# Patient Record
Sex: Male | Born: 1945 | Race: Black or African American | Hispanic: No | Marital: Married | Smoking: Never smoker
Health system: Southern US, Community
[De-identification: ages and names within clinical notes are randomized; demographics above are authoritative.]

## PROBLEM LIST (undated history)

## (undated) DIAGNOSIS — K449 Diaphragmatic hernia without obstruction or gangrene: Secondary | ICD-10-CM

## (undated) DIAGNOSIS — G4730 Sleep apnea, unspecified: Secondary | ICD-10-CM

## (undated) DIAGNOSIS — I2510 Atherosclerotic heart disease of native coronary artery without angina pectoris: Secondary | ICD-10-CM

## (undated) DIAGNOSIS — Z86010 Personal history of colonic polyps: Secondary | ICD-10-CM

## (undated) DIAGNOSIS — K317 Polyp of stomach and duodenum: Secondary | ICD-10-CM

## (undated) DIAGNOSIS — Z87442 Personal history of urinary calculi: Secondary | ICD-10-CM

## (undated) DIAGNOSIS — Z8719 Personal history of other diseases of the digestive system: Secondary | ICD-10-CM

## (undated) DIAGNOSIS — R011 Cardiac murmur, unspecified: Secondary | ICD-10-CM

## (undated) DIAGNOSIS — T7840XA Allergy, unspecified, initial encounter: Secondary | ICD-10-CM

## (undated) DIAGNOSIS — C61 Malignant neoplasm of prostate: Secondary | ICD-10-CM

## (undated) DIAGNOSIS — E119 Type 2 diabetes mellitus without complications: Secondary | ICD-10-CM

## (undated) DIAGNOSIS — K2940 Chronic atrophic gastritis without bleeding: Secondary | ICD-10-CM

## (undated) DIAGNOSIS — K219 Gastro-esophageal reflux disease without esophagitis: Secondary | ICD-10-CM

## (undated) DIAGNOSIS — E785 Hyperlipidemia, unspecified: Secondary | ICD-10-CM

## (undated) DIAGNOSIS — D509 Iron deficiency anemia, unspecified: Secondary | ICD-10-CM

## (undated) DIAGNOSIS — K649 Unspecified hemorrhoids: Secondary | ICD-10-CM

## (undated) DIAGNOSIS — K5730 Diverticulosis of large intestine without perforation or abscess without bleeding: Secondary | ICD-10-CM

## (undated) DIAGNOSIS — D472 Monoclonal gammopathy: Secondary | ICD-10-CM

## (undated) DIAGNOSIS — G4733 Obstructive sleep apnea (adult) (pediatric): Secondary | ICD-10-CM

## (undated) DIAGNOSIS — I10 Essential (primary) hypertension: Secondary | ICD-10-CM

## (undated) DIAGNOSIS — N189 Chronic kidney disease, unspecified: Secondary | ICD-10-CM

## (undated) HISTORY — DX: Allergy, unspecified, initial encounter: T78.40XA

## (undated) HISTORY — DX: Malignant neoplasm of prostate: C61

## (undated) HISTORY — DX: Type 2 diabetes mellitus without complications: E11.9

## (undated) HISTORY — DX: Diverticulosis of large intestine without perforation or abscess without bleeding: K57.30

## (undated) HISTORY — PX: ESOPHAGOGASTRODUODENOSCOPY: SHX1529

## (undated) HISTORY — DX: Chronic atrophic gastritis without bleeding: K29.40

## (undated) HISTORY — DX: Cardiac murmur, unspecified: R01.1

## (undated) HISTORY — DX: Iron deficiency anemia, unspecified: D50.9

## (undated) HISTORY — DX: Monoclonal gammopathy: D47.2

## (undated) HISTORY — PX: UPPER GASTROINTESTINAL ENDOSCOPY: SHX188

## (undated) HISTORY — DX: Gastro-esophageal reflux disease without esophagitis: K21.9

## (undated) HISTORY — DX: Hyperlipidemia, unspecified: E78.5

## (undated) HISTORY — DX: Polyp of stomach and duodenum: K31.7

## (undated) HISTORY — PX: GI PROSTATE BIOPSY: SUR644

## (undated) HISTORY — DX: Unspecified hemorrhoids: K64.9

## (undated) HISTORY — DX: Essential (primary) hypertension: I10

## (undated) HISTORY — DX: Obstructive sleep apnea (adult) (pediatric): G47.33

## (undated) HISTORY — PX: COLONOSCOPY: SHX174

## (undated) HISTORY — PX: HEMORRHOID SURGERY: SHX153

## (undated) HISTORY — DX: Personal history of colonic polyps: Z86.010

## (undated) HISTORY — DX: Diaphragmatic hernia without obstruction or gangrene: K44.9

## (undated) HISTORY — DX: Sleep apnea, unspecified: G47.30

---

## 1997-11-15 HISTORY — PX: KNEE SURGERY: SHX244

## 1998-05-26 ENCOUNTER — Ambulatory Visit (HOSPITAL_COMMUNITY): Admission: RE | Admit: 1998-05-26 | Discharge: 1998-05-26 | Payer: Self-pay | Admitting: Orthopedic Surgery

## 1999-05-04 ENCOUNTER — Emergency Department (HOSPITAL_COMMUNITY): Admission: EM | Admit: 1999-05-04 | Discharge: 1999-05-04 | Payer: Self-pay | Admitting: Emergency Medicine

## 1999-08-25 ENCOUNTER — Encounter: Admission: RE | Admit: 1999-08-25 | Discharge: 1999-08-25 | Payer: Self-pay | Admitting: Internal Medicine

## 2000-08-04 ENCOUNTER — Other Ambulatory Visit: Admission: RE | Admit: 2000-08-04 | Discharge: 2000-08-04 | Payer: Self-pay | Admitting: Plastic Surgery

## 2000-08-04 ENCOUNTER — Encounter (INDEPENDENT_AMBULATORY_CARE_PROVIDER_SITE_OTHER): Payer: Self-pay | Admitting: Specialist

## 2000-09-03 ENCOUNTER — Emergency Department (HOSPITAL_COMMUNITY): Admission: EM | Admit: 2000-09-03 | Discharge: 2000-09-03 | Payer: Self-pay | Admitting: Emergency Medicine

## 2000-09-03 ENCOUNTER — Encounter: Payer: Self-pay | Admitting: Emergency Medicine

## 2000-09-22 ENCOUNTER — Other Ambulatory Visit: Admission: RE | Admit: 2000-09-22 | Discharge: 2000-09-22 | Payer: Self-pay | Admitting: Urology

## 2000-09-28 ENCOUNTER — Encounter: Payer: Self-pay | Admitting: Urology

## 2000-09-28 ENCOUNTER — Ambulatory Visit (HOSPITAL_COMMUNITY): Admission: RE | Admit: 2000-09-28 | Discharge: 2000-09-28 | Payer: Self-pay | Admitting: Urology

## 2001-06-13 ENCOUNTER — Encounter: Payer: Self-pay | Admitting: Internal Medicine

## 2001-06-13 ENCOUNTER — Encounter: Admission: RE | Admit: 2001-06-13 | Discharge: 2001-06-13 | Payer: Self-pay | Admitting: Internal Medicine

## 2001-07-26 DIAGNOSIS — K219 Gastro-esophageal reflux disease without esophagitis: Secondary | ICD-10-CM

## 2001-07-26 HISTORY — DX: Gastro-esophageal reflux disease without esophagitis: K21.9

## 2001-12-21 ENCOUNTER — Encounter: Payer: Self-pay | Admitting: Internal Medicine

## 2001-12-21 ENCOUNTER — Encounter: Admission: RE | Admit: 2001-12-21 | Discharge: 2001-12-21 | Payer: Self-pay | Admitting: Internal Medicine

## 2002-01-04 ENCOUNTER — Encounter: Admission: RE | Admit: 2002-01-04 | Discharge: 2002-01-04 | Payer: Self-pay | Admitting: Internal Medicine

## 2002-01-04 ENCOUNTER — Encounter: Payer: Self-pay | Admitting: Internal Medicine

## 2002-01-16 ENCOUNTER — Encounter: Admission: RE | Admit: 2002-01-16 | Discharge: 2002-01-16 | Payer: Self-pay | Admitting: Internal Medicine

## 2002-01-16 ENCOUNTER — Encounter: Payer: Self-pay | Admitting: Internal Medicine

## 2002-01-18 ENCOUNTER — Encounter: Payer: Self-pay | Admitting: Internal Medicine

## 2002-01-18 ENCOUNTER — Encounter: Admission: RE | Admit: 2002-01-18 | Discharge: 2002-01-18 | Payer: Self-pay | Admitting: Internal Medicine

## 2004-08-21 ENCOUNTER — Encounter: Admission: RE | Admit: 2004-08-21 | Discharge: 2004-08-21 | Payer: Self-pay | Admitting: Internal Medicine

## 2005-02-22 ENCOUNTER — Encounter: Admission: RE | Admit: 2005-02-22 | Discharge: 2005-02-22 | Payer: Self-pay | Admitting: Internal Medicine

## 2005-02-23 ENCOUNTER — Emergency Department (HOSPITAL_COMMUNITY): Admission: EM | Admit: 2005-02-23 | Discharge: 2005-02-23 | Payer: Self-pay | Admitting: Emergency Medicine

## 2005-11-30 ENCOUNTER — Inpatient Hospital Stay (HOSPITAL_COMMUNITY): Admission: EM | Admit: 2005-11-30 | Discharge: 2005-12-01 | Payer: Self-pay | Admitting: Emergency Medicine

## 2005-11-30 ENCOUNTER — Ambulatory Visit: Payer: Self-pay | Admitting: *Deleted

## 2005-12-01 ENCOUNTER — Encounter (INDEPENDENT_AMBULATORY_CARE_PROVIDER_SITE_OTHER): Payer: Self-pay | Admitting: *Deleted

## 2005-12-23 ENCOUNTER — Ambulatory Visit: Payer: Self-pay | Admitting: *Deleted

## 2006-02-10 ENCOUNTER — Ambulatory Visit: Payer: Self-pay | Admitting: Gastroenterology

## 2006-02-11 ENCOUNTER — Ambulatory Visit: Payer: Self-pay | Admitting: Gastroenterology

## 2006-02-13 DIAGNOSIS — K2270 Barrett's esophagus without dysplasia: Secondary | ICD-10-CM

## 2006-02-13 HISTORY — DX: Barrett's esophagus without dysplasia: K22.70

## 2006-02-14 ENCOUNTER — Ambulatory Visit: Payer: Self-pay | Admitting: Gastroenterology

## 2006-02-14 ENCOUNTER — Encounter (INDEPENDENT_AMBULATORY_CARE_PROVIDER_SITE_OTHER): Payer: Self-pay | Admitting: *Deleted

## 2006-03-22 ENCOUNTER — Ambulatory Visit: Payer: Self-pay | Admitting: Gastroenterology

## 2006-06-02 ENCOUNTER — Encounter: Admission: RE | Admit: 2006-06-02 | Discharge: 2006-07-05 | Payer: Self-pay | Admitting: Internal Medicine

## 2006-07-26 ENCOUNTER — Encounter: Payer: Self-pay | Admitting: Cardiology

## 2006-07-26 ENCOUNTER — Ambulatory Visit: Payer: Self-pay

## 2006-07-26 ENCOUNTER — Ambulatory Visit: Payer: Self-pay | Admitting: *Deleted

## 2006-08-22 ENCOUNTER — Ambulatory Visit: Payer: Self-pay | Admitting: Gastroenterology

## 2006-09-05 ENCOUNTER — Ambulatory Visit: Payer: Self-pay | Admitting: Gastroenterology

## 2006-09-05 ENCOUNTER — Encounter (INDEPENDENT_AMBULATORY_CARE_PROVIDER_SITE_OTHER): Payer: Self-pay | Admitting: *Deleted

## 2006-09-05 DIAGNOSIS — K5730 Diverticulosis of large intestine without perforation or abscess without bleeding: Secondary | ICD-10-CM

## 2006-09-05 DIAGNOSIS — Z860100 Personal history of colon polyps, unspecified: Secondary | ICD-10-CM

## 2006-09-05 DIAGNOSIS — K2940 Chronic atrophic gastritis without bleeding: Secondary | ICD-10-CM

## 2006-09-05 DIAGNOSIS — K449 Diaphragmatic hernia without obstruction or gangrene: Secondary | ICD-10-CM

## 2006-09-05 HISTORY — DX: Chronic atrophic gastritis without bleeding: K29.40

## 2006-09-05 HISTORY — DX: Diaphragmatic hernia without obstruction or gangrene: K44.9

## 2006-09-05 HISTORY — DX: Personal history of colon polyps, unspecified: Z86.0100

## 2006-09-05 HISTORY — DX: Diverticulosis of large intestine without perforation or abscess without bleeding: K57.30

## 2006-09-05 HISTORY — DX: Personal history of colonic polyps: Z86.010

## 2006-10-05 ENCOUNTER — Encounter: Admission: RE | Admit: 2006-10-05 | Discharge: 2007-01-03 | Payer: Self-pay | Admitting: Internal Medicine

## 2006-10-05 ENCOUNTER — Encounter: Admission: RE | Admit: 2006-10-05 | Discharge: 2006-11-04 | Payer: Self-pay | Admitting: Internal Medicine

## 2007-01-19 ENCOUNTER — Ambulatory Visit: Payer: Self-pay | Admitting: *Deleted

## 2007-01-19 LAB — CONVERTED CEMR LAB
Calcium: 9.3 mg/dL (ref 8.4–10.5)
Creatinine, Ser: 1.1 mg/dL (ref 0.4–1.5)
GFR calc non Af Amer: 73 mL/min
Glucose, Bld: 90 mg/dL (ref 70–99)
Sodium: 140 meq/L (ref 135–145)

## 2007-08-07 ENCOUNTER — Ambulatory Visit: Payer: Self-pay | Admitting: Internal Medicine

## 2007-08-25 ENCOUNTER — Ambulatory Visit: Payer: Self-pay

## 2007-08-25 ENCOUNTER — Encounter: Payer: Self-pay | Admitting: Internal Medicine

## 2007-08-25 ENCOUNTER — Ambulatory Visit: Payer: Self-pay | Admitting: Internal Medicine

## 2007-08-25 LAB — CONVERTED CEMR LAB
CO2: 29 meq/L (ref 19–32)
Creatinine, Ser: 1.3 mg/dL (ref 0.4–1.5)
Potassium: 3.9 meq/L (ref 3.5–5.1)
Sodium: 143 meq/L (ref 135–145)

## 2007-10-05 ENCOUNTER — Ambulatory Visit: Payer: Self-pay | Admitting: Pulmonary Disease

## 2007-10-19 ENCOUNTER — Encounter: Payer: Self-pay | Admitting: Pulmonary Disease

## 2007-10-19 ENCOUNTER — Ambulatory Visit (HOSPITAL_BASED_OUTPATIENT_CLINIC_OR_DEPARTMENT_OTHER): Admission: RE | Admit: 2007-10-19 | Discharge: 2007-10-19 | Payer: Self-pay | Admitting: Pulmonary Disease

## 2007-11-14 ENCOUNTER — Ambulatory Visit: Payer: Self-pay | Admitting: Pulmonary Disease

## 2007-11-22 ENCOUNTER — Encounter: Payer: Self-pay | Admitting: Pulmonary Disease

## 2007-11-22 ENCOUNTER — Telehealth (INDEPENDENT_AMBULATORY_CARE_PROVIDER_SITE_OTHER): Payer: Self-pay | Admitting: *Deleted

## 2007-11-22 DIAGNOSIS — E119 Type 2 diabetes mellitus without complications: Secondary | ICD-10-CM

## 2007-11-22 DIAGNOSIS — N200 Calculus of kidney: Secondary | ICD-10-CM

## 2007-11-22 DIAGNOSIS — I10 Essential (primary) hypertension: Secondary | ICD-10-CM | POA: Insufficient documentation

## 2007-11-22 DIAGNOSIS — E785 Hyperlipidemia, unspecified: Secondary | ICD-10-CM

## 2007-12-15 ENCOUNTER — Ambulatory Visit: Payer: Self-pay | Admitting: Pulmonary Disease

## 2007-12-15 DIAGNOSIS — G4733 Obstructive sleep apnea (adult) (pediatric): Secondary | ICD-10-CM

## 2008-01-10 ENCOUNTER — Ambulatory Visit: Payer: Self-pay | Admitting: Pulmonary Disease

## 2008-01-23 ENCOUNTER — Encounter: Payer: Self-pay | Admitting: Pulmonary Disease

## 2008-03-12 ENCOUNTER — Encounter: Payer: Self-pay | Admitting: Internal Medicine

## 2008-03-14 ENCOUNTER — Ambulatory Visit: Payer: Self-pay | Admitting: Internal Medicine

## 2008-09-30 ENCOUNTER — Ambulatory Visit: Payer: Self-pay | Admitting: Internal Medicine

## 2009-02-18 ENCOUNTER — Encounter (INDEPENDENT_AMBULATORY_CARE_PROVIDER_SITE_OTHER): Payer: Self-pay | Admitting: *Deleted

## 2009-02-27 ENCOUNTER — Ambulatory Visit: Payer: Self-pay | Admitting: Internal Medicine

## 2009-04-10 ENCOUNTER — Ambulatory Visit: Payer: Self-pay | Admitting: Internal Medicine

## 2009-04-18 ENCOUNTER — Encounter: Payer: Self-pay | Admitting: Internal Medicine

## 2009-04-18 ENCOUNTER — Ambulatory Visit: Payer: Self-pay | Admitting: Internal Medicine

## 2009-06-02 ENCOUNTER — Ambulatory Visit: Payer: Self-pay | Admitting: Internal Medicine

## 2009-06-02 ENCOUNTER — Encounter: Admission: RE | Admit: 2009-06-02 | Discharge: 2009-06-02 | Payer: Self-pay | Admitting: Internal Medicine

## 2009-06-05 ENCOUNTER — Encounter: Admission: RE | Admit: 2009-06-05 | Discharge: 2009-06-05 | Payer: Self-pay | Admitting: Internal Medicine

## 2009-06-16 ENCOUNTER — Ambulatory Visit: Payer: Self-pay | Admitting: Internal Medicine

## 2009-06-16 ENCOUNTER — Ambulatory Visit: Payer: Self-pay

## 2009-06-16 ENCOUNTER — Encounter: Payer: Self-pay | Admitting: Cardiovascular Disease

## 2009-06-25 ENCOUNTER — Telehealth: Payer: Self-pay | Admitting: Internal Medicine

## 2009-07-15 ENCOUNTER — Ambulatory Visit: Payer: Self-pay | Admitting: Internal Medicine

## 2009-10-24 ENCOUNTER — Ambulatory Visit: Payer: Self-pay | Admitting: Internal Medicine

## 2009-11-27 ENCOUNTER — Telehealth: Payer: Self-pay | Admitting: Gastroenterology

## 2009-12-05 ENCOUNTER — Encounter (INDEPENDENT_AMBULATORY_CARE_PROVIDER_SITE_OTHER): Payer: Self-pay | Admitting: *Deleted

## 2009-12-26 ENCOUNTER — Encounter (INDEPENDENT_AMBULATORY_CARE_PROVIDER_SITE_OTHER): Payer: Self-pay | Admitting: *Deleted

## 2009-12-29 ENCOUNTER — Ambulatory Visit: Payer: Self-pay | Admitting: Gastroenterology

## 2010-01-12 ENCOUNTER — Ambulatory Visit: Payer: Self-pay | Admitting: Gastroenterology

## 2010-01-12 DIAGNOSIS — K2970 Gastritis, unspecified, without bleeding: Secondary | ICD-10-CM | POA: Insufficient documentation

## 2010-01-12 DIAGNOSIS — K2990 Gastroduodenitis, unspecified, without bleeding: Secondary | ICD-10-CM

## 2010-02-17 ENCOUNTER — Ambulatory Visit: Payer: Self-pay | Admitting: Internal Medicine

## 2010-02-17 ENCOUNTER — Encounter: Payer: Self-pay | Admitting: Internal Medicine

## 2010-05-01 ENCOUNTER — Encounter (INDEPENDENT_AMBULATORY_CARE_PROVIDER_SITE_OTHER): Payer: Self-pay | Admitting: *Deleted

## 2010-05-01 ENCOUNTER — Ambulatory Visit: Payer: Self-pay | Admitting: Internal Medicine

## 2010-06-12 ENCOUNTER — Ambulatory Visit: Payer: Self-pay | Admitting: Internal Medicine

## 2010-06-12 ENCOUNTER — Encounter: Payer: Self-pay | Admitting: Internal Medicine

## 2010-07-24 ENCOUNTER — Encounter: Payer: Self-pay | Admitting: Internal Medicine

## 2010-07-24 DIAGNOSIS — I679 Cerebrovascular disease, unspecified: Secondary | ICD-10-CM

## 2010-07-31 ENCOUNTER — Ambulatory Visit: Payer: Self-pay

## 2010-07-31 ENCOUNTER — Encounter: Payer: Self-pay | Admitting: Internal Medicine

## 2010-11-13 ENCOUNTER — Ambulatory Visit
Admission: RE | Admit: 2010-11-13 | Discharge: 2010-11-13 | Payer: Self-pay | Source: Home / Self Care | Attending: Internal Medicine | Admitting: Internal Medicine

## 2010-12-15 NOTE — Assessment & Plan Note (Signed)
Summary: yearly/sl   Visit Type:  Follow-up Primary Provider:  Eden Emms [name]   History of Present Illness: Mr. Demarais is a 65 year old gentleman with a history of hypertenison, sleep apnea and dyslpidemia.  I saw him in clinic 1 year ago. Since seen hs denies chest pain.  breathing is OK.  He continues to use his CPAP.   Took meds at 6 AM  Current Medications (verified): 1)  Lipitor 10 Mg  Tabs (Atorvastatin Calcium) .... Once Daily 2)  Flomax 0.4 Mg  Cp24 (Tamsulosin Hcl) .... Take By Mouth As Needed 3)  Adult Aspirin Ec Low Strength 81 Mg  Tbec (Aspirin) .... Take 1 Tablet By Mouth Once A Day 4)  Lotrel 10-20 Mg  Caps (Amlodipine Besy-Benazepril Hcl) .... Take 1 Tablet By Mouth Once A Day 5)  Androgel Pump 1 % Gel (Testosterone) .... Once A Day 6)  Furosemide 40 Mg Tabs (Furosemide) .... Take One Tablet By Mouth Daily. 7)  Nexium 40 Mg Cpdr (Esomeprazole Magnesium) .... Take One Tablet By Mouth Daily.  Allergies: No Known Drug Allergies  Past History:  Past medical, surgical, family and social histories (including risk factors) reviewed, and no changes noted (except as noted below).  Past Medical History: Reviewed history from 11/22/2007 and no changes required. DM (ICD-250.00) GOUTY ARTHRITIS (ICD-274.0) NEPHROLITHIASIS (ICD-592.0) DYSLIPIDEMIA (ICD-272.4) HYPERTENSION (ICD-401.9)  Family History: Reviewed history from 06/14/2009 and no changes required.  Father died of a heart attack at age 83.  Mother died 72s  with diabetes.  One brother has diabetes.  One brother is dead, not heart  related.  Social History: Reviewed history from 06/14/2009 and no changes required. He is a Paramedic.  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  He is  married and has three children.  Vital Signs:  Patient profile:   65 year old male Height:      69 inches Weight:      294 pounds BMI:     43.57 Pulse rate:   80 / minute BP sitting:   142 / 93  (left arm) Cuff size:   large  Vitals  Entered By: Burnett Kanaris, CNA (July 24, 2010 11:59 AM)  Physical Exam  Additional Exam:  Obese 65 year old in NAD HEENT:  Normocephalic, atraumatic. EOMI, PERRLA.  Neck: JVP is normal. No thyromegaly. No bruits.  Lungs: clear to auscultation. No rales no wheezes.  Heart: Regular rate and rhythm. Normal S1, S2. No S3.   No significant murmurs. PMI not displaced.  Abdomen:  Supple, nontender. Normal bowel sounds. No masses. No hepatomegaly.  Extremities:   Good distal pulses throughout. No lower extremity edema.  Musculoskeletal :moving all extremities.  Neuro:   alert and oriented x3.    EKG  Procedure date:  07/24/2010  Findings:      NSR  80 bpm.  LVH.  Nonspecific ST T wave changes.  Impression & Recommendations:  Problem # 1:  HYPERTENSION (ICD-401.9) Keep on same regimen.  BP is up some.  Higher than on last visit when it was under good control ensured that he was using CPAP Will get note from Dr. Beryle Quant office.  He is due to see her in December.  i would not change meds now.  Problem # 2:  OBSTRUCTIVE SLEEP APNEA (ICD-327.23) Continue to use CPAP  Problem # 3:  DYSLIPIDEMIA (ICD-272.4) Will get records from Dr. Beryle Quant office.  Keep on Lipitor.  Other Orders: EKG w/ Interpretation (93000) Carotid Duplex (Carotid Duplex)  Patient  Instructions: 1)  Your physician recommends that you schedule a follow-up appointment in: 12 months 2)  Your physician has requested that you have a carotid duplex. This test is an ultrasound of the carotid arteries in your neck. It looks at blood flow through these arteries that supply the brain with blood. Allow one hour for this exam. There are no restrictions or special instructions. 3)  Your physician recommends that you continue on your current medications as directed. Please refer to the Current Medication list given to you today.  Appended Document: yearly/sl Records from Dr.[name]'s office received and given to Dr.Ross.

## 2010-12-15 NOTE — Procedures (Signed)
Summary: Upper Endoscopy  Patient: Gabriela Giannelli Note: All result statuses are Final unless otherwise noted.  Tests: (1) Upper Endoscopy (EGD)   EGD Upper Endoscopy       DONE     [facility]     [address]     [place], [place]  [address]           ENDOSCOPY PROCEDURE REPORT           PATIENT:  Wille, Aubuchon  MR#:  [id number]     BIRTHDATE:  [birth year], 63 yrs. old  GENDER:  male           ENDOSCOPIST:  Vania Rea. [name], MD, [facility]     Referred by:           PROCEDURE DATE:  01/12/2010     PROCEDURE:  EGD with biopsy     ASA CLASS:  Class II     INDICATIONS:  GERD           MEDICATIONS:  None     TOPICAL ANESTHETIC:  Exactacain Spray           DESCRIPTION OF PROCEDURE:   After the risks benefits and     alternatives of the procedure were thoroughly explained, informed     consent was obtained.  The LB GIF-H180 [id number] endoscope was     introduced through the mouth and advanced to the second portion of     the duodenum, without limitations.  The instrument was slowly     withdrawn as the mucosa was fully examined.     <<PROCEDUREIMAGES>>           Normal duodenal folds were noted.  Moderate gastritis was found in     the antrum. nodular gastritis and erosions.CLO bx. done.Also     scattered benign hyperplastic polyps noted in body and funduds     areas.  The esophagus and gastroesophageal junction were     completely normal in appearance.    Retroflexed views revealed no     masses.    The scope was then withdrawn from the patient and the     procedure completed.           COMPLICATIONS:  None           ENDOSCOPIC IMPRESSION:     1) Normal duodenal folds     2) Moderate gastritis in the antrum     3) Normal esophagus     4) No masses     r/o h.pylori.continue PPi rx.     RECOMMENDATIONS:     1) Anti-reflux regimen to be follow     2) Rx CLO if positive     3) continue PPI           REPEAT EXAM:  No           ______________________________     Vania Rea. [name], MD, [facility]           CC:           n.     eSIGNED:   Vania Rea. Wren Pryce at 01/12/2010 10:06 AM           Deatra Canter, [id number]  Note: An exclamation mark (!) indicates a result that was not dispersed into the flowsheet. Document Creation Date: 01/12/2010 10:06 AM _______________________________________________________________________  (1) Order result status: Final Collection or observation date-time: 01/12/2010 09:58 Requested date-time:  Receipt date-time:  Reported date-time:  Referring Physician:  Ordering Physician: Sheryn Bison [phone]) Specimen Source:  Source: Launa Grill Order Number: [id number] Lab site:

## 2010-12-15 NOTE — Letter (Signed)
Summary: [facility] Instructions  [facility]  [address] [place], [place] [address]   Phone: [phone]  Fax: [phone]       Leonard [name]    [birth year]    MRN: [id number]        Procedure Day /Date:  Monday 01/12/2010     Arrival Time: 8:30 am      Procedure Time: 9:30 am     Location of Procedure:                    _x _  [facility] (4th Floor)                        PREPARATION FOR COLONOSCOPY WITH MOVIPREP   Starting 5 days prior to your procedure Wednesday 2/23  do not eat nuts, seeds, popcorn, corn, beans, peas,  salads, or any raw vegetables.  Do not take any fiber supplements (e.g. Metamucil, Citrucel, and Benefiber).  THE DAY BEFORE YOUR PROCEDURE         DATE: Sunday 2/27  1.  Drink clear liquids the entire day-NO SOLID FOOD  2.  Do not drink anything colored red or purple.  Avoid juices with pulp.  No orange juice.  3.  Drink at least 64 oz. (8 glasses) of fluid/clear liquids during the day to prevent dehydration and help the prep work efficiently.  CLEAR LIQUIDS INCLUDE: Water Jello Ice Popsicles Tea (sugar ok, no milk/cream) Powdered fruit flavored drinks Coffee (sugar ok, no milk/cream) Gatorade Juice: apple, white grape, white cranberry  Lemonade Clear bullion, consomm, broth Carbonated beverages (any kind) Strained chicken noodle soup Hard Candy                             4.  In the morning, mix first dose of MoviPrep solution:    Empty 1 Pouch A and 1 Pouch B into the disposable container    Add lukewarm drinking water to the top line of the container. Mix to dissolve    Refrigerate (mixed solution should be used within 24 hrs)  5.  Begin drinking the prep at 5:00 p.m. The MoviPrep container is divided by 4 marks.   Every 15 minutes drink the solution down to the next mark (approximately 8 oz) until the full liter is complete.   6.  Follow completed prep with 16 oz of clear liquid of your choice (Nothing  red or purple).  Continue to drink clear liquids until bedtime.  7.  Before going to bed, mix second dose of MoviPrep solution:    Empty 1 Pouch A and 1 Pouch B into the disposable container    Add lukewarm drinking water to the top line of the container. Mix to dissolve    Refrigerate  THE DAY OF YOUR PROCEDURE      DATE: Monday 2/28  Beginning at 4:30 am (5 hours before procedure):         1. Every 15 minutes, drink the solution down to the next mark (approx 8 oz) until the full liter is complete.  2. Follow completed prep with 16 oz. of clear liquid of your choice.    3. You may drink clear liquids until 7:30 am (2 HOURS BEFORE PROCEDURE).   MEDICATION INSTRUCTIONS  Unless otherwise instructed, you should take regular prescription medications with a small sip of water   as early as possible the morning  of your procedure.   Additional medication instructions: Do not take Furosemide the day of your procedure.         OTHER INSTRUCTIONS  You will need a responsible adult at least 64 years of age to accompany you and drive you home.   This person must remain in the waiting room during your procedure.  Wear loose fitting clothing that is easily removed.  Leave jewelry and other valuables at home.  However, you may wish to bring a book to read or  an iPod/MP3 player to listen to music as you wait for your procedure to start.  Remove all body piercing jewelry and leave at home.  Total time from sign-in until discharge is approximately 2-3 hours.  You should go home directly after your procedure and rest.  You can resume normal activities the  day after your procedure.  The day of your procedure you should not:   Drive   Make legal decisions   Operate machinery   Drink alcohol   Return to work  You will receive specific instructions about eating, activities and medications before you leave.    The above instructions have been reviewed and explained to me  by   Ezra Sites RN  December 29, 2009 1:21 PM    I fully understand and can verbalize these instructions _____________________________ Date _________

## 2010-12-15 NOTE — Letter (Signed)
Summary: Previsit letter  [facility]  [address] [place], [place] [address]   Phone: [phone]  Fax: [phone]       12/05/2009 MRN: [id number]  Leonard [name] [address] [place], [place]  [address]  Dear [name],  Welcome to the Gastroenterology Division at [facility].    You are scheduled to see a nurse for your pre-procedure visit on 12/29/2009 at 1:00PM on the 3rd floor at [facility], [address]  We ask that you try to arrive at our office 15 minutes prior to your appointment time to allow for check-in.  Your nurse visit will consist of discussing your medical and surgical history, your immediate family medical history, and your medications.    Please bring a complete list of all your medications or, if you prefer, bring the medication bottles and we will list them.  We will need to be aware of both prescribed and over the counter drugs.  We will need to know exact dosage information as well.  If you are on blood thinners (Coumadin, Plavix, Aggrenox, Ticlid, etc.) please call our office today/prior to your appointment, as we need to consult with your physician about holding your medication.   Please be prepared to read and sign documents such as consent forms, a financial agreement, and acknowledgement forms.  If necessary, and with your consent, a friend or relative is welcome to sit-in on the nurse visit with you.  Please bring your insurance card so that we may make a copy of it.  If your insurance requires a referral to see a specialist, please bring your referral form from your primary care physician.  No co-pay is required for this nurse visit.     If you cannot keep your appointment, please call [phone] to cancel or reschedule prior to your appointment date.  This allows [place] the opportunity to schedule an appointment for another patient in need of care.    Thank you for choosing  Gastroenterology for your medical needs.   We appreciate the opportunity to care for you.  Please visit [place] at our website  to learn more about our practice.                     Sincerely.                                                                                                                   The Gastroenterology Division

## 2010-12-15 NOTE — Procedures (Signed)
Summary: Colonoscopy  Patient: Leonard [name] Note: All result statuses are Final unless otherwise noted.  Tests: (1) Colonoscopy (COL)   COL Colonoscopy           DONE     [facility]     [address]     [place], [place]  [address]           COLONOSCOPY PROCEDURE REPORT           PATIENT:  [name], [name]  MR#:  [id number]     BIRTHDATE:  1945/12/04, 63 yrs. old  GENDER:  male           ENDOSCOPIST:  Vania Rea. [name], MD, [facility]     Referred by:           PROCEDURE DATE:  01/12/2010     PROCEDURE:  Surveillance Colonoscopy     ASA CLASS:  Class II     INDICATIONS:  history of pre-cancerous (adenomatous) colon polyps                 MEDICATIONS:   Fentanyl 50 mcg IV, Versed 7 mg IV           DESCRIPTION OF PROCEDURE:   After the risks benefits and     alternatives of the procedure were thoroughly explained, informed     consent was obtained.  Digital rectal exam was performed and     revealed no abnormalities.   The LB CF-H180AL [id number] endoscope     was introduced through the anus and advanced to the cecum, which     was identified by both the appendix and ileocecal valve, without     limitations.  The quality of the prep was excellent, using     MoviPrep.  The instrument was then slowly withdrawn as the colon     was fully examined.     <<PROCEDUREIMAGES>>           FINDINGS:  Scattered diverticula were found found scattered     throught the colon.  No polyps or cancers were seen.  This was     otherwise a normal examination of the colon.   Retroflexed views     in the rectum revealed internal hemorrhoids.    The scope was then     withdrawn from the patient and the procedure completed.           COMPLICATIONS:  None           ENDOSCOPIC IMPRESSION:     1) Diverticula, scattered found scattered throught the colon     2) No polyps or cancers     3) Otherwise normal examination     4) Internal hemorrhoids     RECOMMENDATIONS:     1) High fiber diet.     2)  Repeat Colonoscopy in 5 years.           REPEAT EXAM:  No           ______________________________     Vania Rea. [name], MD, [facility]           CC:           n.     eSIGNED:   Vania Rea. Patterson at 01/12/2010 09:54 AM           Deatra Canter, [id number]  Note: An exclamation mark (!) indicates a result that was not dispersed into the flowsheet. Document Creation Date: 01/12/2010 9:55 AM _______________________________________________________________________  (1) Order result  status: Final Collection or observation date-time: 01/12/2010 09:48 Requested date-time:  Receipt date-time:  Reported date-time:  Referring Physician:   Ordering Physician: Sheryn Bison [phone]) Specimen Source:  Source: Launa Grill Order Number: [id number] Lab site:   Appended Document: Colonoscopy    Clinical Lists Changes  Observations: Added new observation of COLONNXTDUE: 12/2014 (01/12/2010 10:48)      Appended Document: Colonoscopy     Procedures Next Due Date:    Colonoscopy: 12/2014

## 2010-12-15 NOTE — Letter (Signed)
Summary: Appointment - Reminder 2  [facility], Main Office  [address]   [place], [place] [address]   Phone: [phone]  Fax: [phone]     May 01, 2010 MRN: [id number]   Leonard [name] [address] [place], [place]  [address]   Dear [name],  Our records indicate that it is time to schedule a follow-up appointment with Dr. Tenny Craw in August. It is very important that we reach you to schedule this appointment. We look forward to participating in your health care needs. Please contact us at the number listed above at your earliest convenience to schedule your appointment.  If you are unable to make an appointment at this time, give [place] a call so we can update our records.     Sincerely,   [facility] Scheduling Team

## 2010-12-15 NOTE — Letter (Signed)
Summary: Internal Medicine Pediatrics Office Note   Internal Medicine Pediatrics Office Note   Imported By: Roderic Ovens 07/30/2010 13:19:51  _____________________________________________________________________  External Attachment:    Type:   Image     Comment:   External Document

## 2010-12-15 NOTE — Progress Notes (Signed)
Summary: Schedule Endo/Colon  Phone Note Outgoing Call Call back at [facility] Phone [phone]   Call placed by: Harlow Mares CMA Duncan Dull),  November 27, 2009 1:16 PM Call placed to: Patient Summary of Call: Left message on patients machine to call back. patient needs to have endo/colon as a direct as long as he meets the requirements. Initial call taken by: Harlow Mares CMA Duncan Dull),  November 27, 2009 1:17 PM  Follow-up for Phone Call        Left message on patients machine to call back.  Follow-up by: Harlow Mares CMA Duncan Dull),  December 05, 2009 10:08 AM  Additional Follow-up for Phone Call Additional follow up Details #1::        patient scheduled Additional Follow-up by: Harlow Mares CMA (AAMA),  December 11, 2009 10:06 AM

## 2010-12-15 NOTE — Miscellaneous (Signed)
Summary: [facility]  Clinical Lists Changes  Medications: Added new medication of MOVIPREP 100 GM  SOLR (PEG-KCL-NACL-NASULF-NA ASC-C) As per prep instructions. - Signed Rx of MOVIPREP 100 GM  SOLR (PEG-KCL-NACL-NASULF-NA ASC-C) As per prep instructions.;  #1 x 0;  Signed;  Entered by: Ezra Sites RN;  Authorized by: Mardella Layman MD [facility];  Method used: Electronically to [facility] # [phone]*, [address] [place], [place], [place]  [address], Ph: [phone], Fax: [phone] Observations: Added new observation of NKA: T (12/29/2009 13:02)    Prescriptions: MOVIPREP 100 GM  SOLR (PEG-KCL-NACL-NASULF-NA ASC-C) As per prep instructions.  #1 x 0   Entered by:   Ezra Sites RN   Authorized by:   Mardella Layman MD [facility]   Signed by:   Ezra Sites RN on 12/29/2009   Method used:   Electronically to        [facility] # [phone]* (retail)       [address] [place], [place]  [address]       Ph: [phone]       Fax: [phone]   RxID:   [id number]

## 2010-12-15 NOTE — Miscellaneous (Signed)
Summary: Orders Update clotest  Clinical Lists Changes  Problems: Added new problem of GASTRITIS (ICD-535.50) Orders: Added new Test order of TLB-H Pylori Screen Gastric Biopsy (83013-CLOTEST) - Signed 

## 2011-03-08 ENCOUNTER — Ambulatory Visit (INDEPENDENT_AMBULATORY_CARE_PROVIDER_SITE_OTHER): Payer: Federal, State, Local not specified - PPO | Admitting: Pulmonary Disease

## 2011-03-08 ENCOUNTER — Encounter: Payer: Self-pay | Admitting: Pulmonary Disease

## 2011-03-08 VITALS — BP 150/78 | HR 73 | Temp 97.8°F | Ht 69.25 in | Wt 293.0 lb

## 2011-03-08 DIAGNOSIS — G4733 Obstructive sleep apnea (adult) (pediatric): Secondary | ICD-10-CM

## 2011-03-08 NOTE — Progress Notes (Signed)
  Subjective:    Patient ID: Leonard [name], male    DOB: [birth year], 65 y.o.   MRN: [id number]  HPI The pt comes in today for f/u of his known osa.  He has been wearing cpap compliantly, although has not been seen since 2009.  He is keeping up with his mask changes and supplies, and feels his cpap is working well for him.  He sleeps well, and has adequate daytime alertness.  Unfortunately, he has only lost 3 pounds since last visit.    Review of Systems  Constitutional: Negative for fever and unexpected weight change.  HENT: Negative for ear pain, nosebleeds, congestion, sore throat, rhinorrhea, sneezing, trouble swallowing, dental problem, postnasal drip and sinus pressure.   Eyes: Negative for redness and itching.  Respiratory: Negative for cough, chest tightness, shortness of breath and wheezing.   Cardiovascular: Positive for palpitations. Negative for leg swelling.  Gastrointestinal: Negative for nausea and vomiting.  Genitourinary: Negative for dysuria.  Musculoskeletal: Negative for joint swelling.  Skin: Negative for rash.  Neurological: Negative for headaches.  Hematological: Does not bruise/bleed easily.  Psychiatric/Behavioral: Negative for dysphoric mood. The patient is not nervous/anxious.        Objective:   Physical Exam Obese male in nad No skin breakdown or pressure necrosis from cpap mask Mild LE edema, no cyanosis Alert, does not appear sleepy, moves all 4        Assessment & Plan:

## 2011-03-08 NOTE — Patient Instructions (Signed)
Continue on cpap, keep up with masks changes and supplies Work on weight loss followup with me in one year

## 2011-03-08 NOTE — Assessment & Plan Note (Signed)
The pt is doing well with cpap, and feels it is controlling his symptoms adequately.  I have asked him to keep up with supplies and mask changes, and to work harder on weight loss.  He is to see me back in one year, but call if having issues.

## 2011-03-30 ENCOUNTER — Telehealth: Payer: Self-pay | Admitting: Pulmonary Disease

## 2011-03-30 NOTE — Assessment & Plan Note (Signed)
[facility]                             PULMONARY OFFICE NOTE   Leonard [name], Leonard [name]                        MRN:          [id number]  DATE:10/05/2007                            DOB:          May 11, 1946    SLEEP MEDICINE CONSULTATION   HISTORY OF PRESENT ILLNESS:  The patient is a 65 year old black male who  I have been asked to see for possible sleep apnea.  The patient states  that his wife has noticed that he has loud snoring as well as pauses in  his breathing during sleep.  The patient denies any choking arousals.  Typically, he gets to bed between 10:30 p.m. and 11 p.m. and gets up  between 5:45 a.m. and 6 a.m. to start his day.  He feels that he is  rested most of the time.  The patient does admit that he awakens two to  three times a night for unknown reasons.  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  He stays very, very busy at  work.  However, once he gets home and sits down he will begin to doze.  However, he does not feel that he has difficulties watching TV or movies  in the evening.  His wife is not here to verify this.  He denies any  difficulties with driving.  The patient states that his weight has been  neutral over the last 2 years.   PAST MEDICAL HISTORY:  1. Hypertension.  2. Dyslipidemia.   Otherwise, it is unremarkable.   CURRENT MEDICATIONS:  1. Prevacid 30 mg daily.  2. Lipitor 10 mg daily.  3. Aspirin 81 mg daily.  4. Caduet 10/20 one daily.  5. Nitroglycerin p.r.n.   The patient has no known drug allergies.   SOCIAL HISTORY:  He is married, he has never smoked.   FAMILY HISTORY:  Remarkable for mother having heart disease as well as  diabetes.   REVIEW OF SYSTEMS:  As per history of present illness.  Also, see  patient intake form documented in the chart.   PHYSICAL EXAMINATION:  GENERAL:  He is a morbid  obesity male in no acute  distress.  Blood pressure is 118/62, pulse 88, temperature is 98.6,  weight is 297 pounds, he is 5 feet 9 inches tall, O2 saturation on room  air is 94%.  HEENT:  Pupils equal, round, and reactive to light and accommodation.  Extraocular muscles are intact.  Nares show deviated septum to the right  with turbinate hypertrophy.  Oropharynx does show elongation of soft  palate and uvula.  NECK:  Supple without JVD or lymphadenopathy.  There is no palpable  thyromegaly.  CHEST:  Totally clear.  CARDIAC:  Reveals regular rate and rhythm.  No murmurs, rubs or gallops.  ABDOMEN:  Soft, nontender, with good bowel sounds.  GENITAL, RECTAL, BREAST EXAM:  Not done and not indicated.  LOWER EXTREMITIES:  Show trace edema.  Pulses are intact  distally.  NEUROLOGIC:  Alert and oriented with no observable motor defects.   IMPRESSION:  Probable obstructive sleep apnea.  The patient gives a very  good history for this, is obese and has abnormal upper airway anatomy.  I have had a long discussion with him about sleep apnea including the  short-term quality of life issues and the longterm cardiovascular  issues.  At this point in time I do think there is enough suspicion to  support screening with nocturnal polysomnography.   PLAN:  1. Schedule for nocturnal polysomnography.  2. Work on weight loss.  3. The patient will follow up after the above.     Barbaraann Share, MD,FCCP  Electronically Signed    KMC/MedQ  DD: 10/05/2007  DT: 10/06/2007  Job #: [id number]   cc:   [name]. [name], M.D.

## 2011-03-30 NOTE — Procedures (Signed)
NAME:  Leonard [name], Leonard [name] NO.:  [id number]   MEDICAL RECORD NO.:  [id number]          PATIENT TYPE:  OUT   LOCATION:  SLEEP CENTER                 FACILITY:  [facility]   PHYSICIAN:  Barbaraann Share, MD,FCCPDATE OF BIRTH:  01-11-1947   DATE OF STUDY:  10/19/2007                            NOCTURNAL POLYSOMNOGRAM   REFERRING PHYSICIAN:   LOCATION:  Sleep lab.   REFERRING PHYSICIAN:  Barbaraann Share, MD, FCCP.   DATE OF STUDY:  10/19/2007.   INDICATION FOR STUDY:  Hypersomnia with sleep apnea.   EPWORTH SLEEPINESS SCORE:  5.   SLEEP ARCHITECTURE:  The patient had a total sleep time of 372 minutes  with no slow-wave sleep and significantly decreased REM.  Sleep onset  latency was normal at 6 minutes, and REM onset was normal at 57 minutes.  Sleep efficiency was fairly good at 92%.   RESPIRATORY DATA:  The patient was found to have 11 apneas and 167  hypopneas for an apnea/hypopnea index of 29 events per hour.  The events  were not positional, but they were clearly worse during REM.  There was  very loud snoring noted throughout.   OXYGEN DATA:  There was O2 desaturation as low as 65% with the patient's  obstructive events.   CARDIAC DATA:  No clinically significant arrhythmias were noted.   MOVEMENT-PARASOMNIA:  None.   IMPRESSIONS-RECOMMENDATIONS:  Moderate obstructive sleep apnea/hypopnea  syndrome with an apnea/hypopnea index of 29 events per hour and O2  desaturation as low as 65%.  The events were worse during  REM.  Treatment for this degree of sleep apnea can include weight loss,  upper airway surgery, oral appliance and also CPAP.      Barbaraann Share, MD,FCCP  Diplomate, American Board of Sleep  Medicine  Electronically Signed     KMC/MEDQ  D:  11/17/2007 14:35:14  T:  11/17/2007 15:55:40  Job:  [id number]

## 2011-03-30 NOTE — Telephone Encounter (Signed)
I signed about a hundred cmn's this past weekend.  Can check with alida to see if in there.

## 2011-03-30 NOTE — Telephone Encounter (Signed)
Please advise Alida if you have seen pt CMN. Thanks  Carver Fila, CMA

## 2011-03-30 NOTE — Assessment & Plan Note (Signed)
[facility]                            CARDIOLOGY OFFICE NOTE   Leonard [name], Leonard [name] Leonard [name]                        MRN:          [id number]  DATE:08/07/2007                            DOB:          Apr 03, 1946    IDENTIFICATION:  The patient is a 65 year old gentleman who was  previously followed by Dr. Glennon Hamilton.  He was last seen in the  cardiology clinic back in March.  He is also followed by Dr. [name].  [name].   When he was seen last his blood pressure was under fair control.   Since being seen here he has actually been seen by Dr. [name] and was  recently put on furosemide 20 daily.  He has not had any blood work  done. York Spaniel that he is urinating more.   Denies chest pain, breathing is okay.   CURRENT MEDICATIONS:  1. Prevacid 30.  2. Lipitor 10.  3. Flomax p.r.n.  4. Aspirin 81.  5. Lotrel 10/20.  6. Furosemide 20.   PHYSICAL EXAMINATION:  GENERAL:  The patient is in no distress.  VITAL SIGNS:  Blood pressure 130/80, pulse is 71 and regular, weight is  293.  LUNGS:  Clear without rales.  CARDIAC EXAM:  Regular rate and rhythm S1, S2.  Grade 2/6 systolic  murmur heard best in the aortic area.  ABDOMEN:  Benign.  EXTREMITIES:  No edema.   IMPRESSION:  1. Hypertension. Better control.  Would check a B-MET though.  2. Murmur.  Patient had an aortic valve echo that might be bicuspid.      Would go ahead and schedule an echocardiogram to reevaluate and      compare to previous from 1 year ago.  3. Dyslipidemia.  Last lipid panel by Dr. Jonny Ruiz was back in the winter,      LDL was 61, HDL was 74. Continue.   I will set to see the patient back otherwise in the spring.  Will be in  touch with him with the test results.   ADDENDUM:  A 12-lead EKG:  Sinus rhythm of 70, left ventricular  hypertrophy. Nonspecific T wave changes.     Pricilla Riffle, MD, [facility]  Electronically Signed    PVR/MedQ  DD: 08/07/2007  DT: 08/07/2007  Job #: [id number]   cc:   [name]. [name], M.D.

## 2011-03-30 NOTE — Assessment & Plan Note (Signed)
[facility]                            CARDIOLOGY OFFICE NOTE   Leonard [name], Leonard [name] JACK BOLIO                        MRN:          [id number]  DATE:03/14/2008                            DOB:          1946/10/12    IDENTIFICATION:  Leonard [name] is a 65 year old gentleman who was followed  by Leonard [name] in the past.  I saw him back in September.  He has a  history of hypertension, murmur, dyslipidemia.   After I saw him, I got another echocardiogram that showed actually the  aortic valve was normal, LV was normal with very mild LVH.   Since seen, the patient has been seen by Dr. [name].  He notes rare  chest pain, maybe one time per month.   NOTE:  He has joined [employer] and is beginning to exercise.  He hopes to  lose weight.   He continues to use CPAP.   CURRENT MEDICATIONS:  Include:  1. Prevacid 30.  2. Lipitor 10.  3. Flomax p.r.n.  4. Aspirin 81.  5. Amlodipine.  6. Lasix 20.   PHYSICAL EXAM:  GENERAL:  The patient is in no distress.  VITAL SIGNS:  Blood pressure 132/80, pulse is 60, weight 295, up 2  pounds from previous.  LUNGS:  Clear.  CARDIAC:  Exam regular rate rhythm, S1-S2 no S3 no murmurs.  ABDOMEN:  Benign.  EXTREMITIES:  No edema.   IMPRESSION:  1. Hypertension, adequate control.  Would continue.  2. Murmur benign.  No valvular disease.  3. Dyslipidemia.  Dr. has followed his LDL, will get the labs from her      office.   Otherwise, I will set followup for 1 year, sooner if problems develop.   ADDENDUM:  Labs indeed found from earlier this week:  Total cholesterol  146, HDL of 69, LDL of 67, AST, ALT are normal, would continue.     Pricilla Riffle, MD, [facility]  Electronically Signed    PVR/MedQ  DD: 03/14/2008  DT: 03/14/2008  Job #: [id number]   cc:   [name]. [name], M.D.

## 2011-03-30 NOTE — Telephone Encounter (Signed)
Dr. Shelle Iron, I spoke with this pt and he states that [facility] is needing some sort of verification that he has been seen since 09. He states that they faxed you a form to sign, I am assuming he means a CMN. Pt just here last month. Pls advise if you have seen CMN thanks

## 2011-04-02 NOTE — Discharge Summary (Signed)
NAMEMarland Kitchen  DEEPAK, BLESS NO.:  [id number]   MEDICAL RECORD NO.:  [id number]          PATIENT TYPE:  INP   LOCATION:  2623                         FACILITY:  [facility]   PHYSICIAN:  Vida Roller, M.D.   DATE OF BIRTH:  11-19-1945   DATE OF ADMISSION:  11/30/2005  DATE OF DISCHARGE:  12/01/2005                                 DISCHARGE SUMMARY   PRIMARY CARDIOLOGIST:  Cecil Cranker, M.D.   PRINCIPAL DIAGNOSIS:  Chest pain.   OTHER DIAGNOSES:  1.  Hypertension.  2.  Benign prostatic hypertrophy.  3.  Gastroesophageal reflux disease.  4.  History of mild anemia.  5.  History of nephrolithiasis.  6.  History of gout.  7.  Status post repair of quadriceps tendon.  8.  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  9.  Hyperlipidemia.  10. Diet-controlled diabetes.   ALLERGIES:  No known drug allergies.   PROCEDURE:  Left heart cardiac catheterization.   HISTORY OF PRESENT ILLNESS:  A 65 year old African American male with the  above problem list who presented to [facility] to see Dr. Glennon Hamilton on November 30, 2005, with complaint of a 10-day history of mid  sternal chest pain which was relieved promptly with nitroglycerin. Symptoms  were thought to be somewhat atypical as they do not occur with exertion. On  the morning of that visit he did have chest pain at work. Decision was made  to admit the patient for further evaluation and cardiac catheterization.   HOSPITAL COURSE:  Mr. Blank ruled out for MI and underwent cardiac  catheterization this morning which revealed nonobstructive coronary artery  disease with normal LV function with an EF of 60% without regional wall  motion abnormalities. Postprocedure, he has been without chest pain, he has  been ambulating without any recurring symptoms. The patient is being  discharged home this evening satisfactory condition.   DISCHARGE LABORATORY:  Hemoglobin 14.2, hematocrit  41.7, WBC 8.4, platelet  count 302,000, MCV 87.0. Sodium 139, potassium 3.5, chloride 106, CO2 24,  BUN 10, creatinine 1.1, glucose 85.  PT 13.6, INR 1.0, PTT 33, total  bilirubin 0.7,  alkaline phosphatase 66, AST 27, ALT 30, albumin 4.0.  Troponin-I was 0.01. Calcium 9.7. TSH was 1.910.   DISPOSITION:  The patient is being discharged home today in good condition.   FOLLOWUP PLANS AND APPOINTMENTS:  Will have him follow up with Dr. Corinda Gubler  in approximately two weeks for groin check and otherwise he should continue  follow up with his primary care physician, Dr. [name].   DISCHARGE MEDICATIONS:  We have not changed any of his medications from his  current regimen and he will remain on Norvasc 10 mg daily, Prevacid 20 mg  daily, Flomax 0.4 mg daily, hydrocodone/acetaminophen p.r.n.   OUTSTANDING LABORATORIES:  Aspirin 81 mg daily, Lipitor 10 mg daily.   OUTSTANDING LABORATORY STUDIES:  None.   DURATION OF DISCHARGE ENCOUNTER:  25 minutes.      Ok Anis, NP      Vida Roller,  M.D.  Electronically Signed    CRB/MEDQ  D:  12/01/2005  T:  12/01/2005  Job:  [id number]   cc:   [name]. [name], M.D.  Fax: [phone]   E. Graceann Congress, M.D.  [address]  [address]  [place]  [place] [address]

## 2011-04-02 NOTE — Telephone Encounter (Signed)
Spoke with Tobi Bastos at [facility] and she is refaxing CMN.

## 2011-04-02 NOTE — Cardiovascular Report (Signed)
NAMEMarland Kitchen  Leonard [name], Leonard [name] NO.:  [id number]   MEDICAL RECORD NO.:  [id number]          PATIENT TYPE:  INP   LOCATION:  2623                         FACILITY:  [facility]   PHYSICIAN:  Vida Roller, M.D.   DATE OF BIRTH:  Jan 12, 1946   DATE OF PROCEDURE:  12/01/2005  DATE OF DISCHARGE:                              CARDIAC CATHETERIZATION   PRIMARY CARE PHYSICIAN:  [name]. [name], M.D.   CARDIOLOGIST:  Cecil Cranker, M.D.   HISTORY OF PRESENT ILLNESS:  Leonard [name] is a 65 year old man with cardiac  risk factors of hypertension, hyperlipidemia, and obesity who presents with  atypical chest discomfort to the office, was admitted, ruled out for  myocardial infarction by enzymes. This as an evaluation for ischemia.   PROCEDURES PERFORMED:  1.  Left heart catheterization  2.  Selective coronary angiography.  3.  Left ventriculography.  4.  Abdominal aortogram.   PROCEDURE IN DETAIL:  After obtaining informed consent the patient was  brought to the cardiac catheterization laboratory where he was prepped and  draped in usual sterile manner. Local anesthetic was obtained over the right  groin using 1% lidocaine without epinephrine. The right femoral artery was  cannulated using the modified Seldinger technique with a 6-French 10-cm  sheath. Left heart catheterization was performed using a Judkins left #4, a  Judkins right #4, and a pigtail catheter. The pigtail catheter was used for  left ventriculography as well as a abdominal aortography. The left  ventriculography in the 30 degrees RAO view. The abdominal aortography in  the straight AP view. At the conclusion of procedure the catheters were  removed. The patient was moved back to the cardiology holding area. The  femoral artery sheath was removed. Hemostasis was obtained using direct  manual pressure. At the conclusion of the hold there is no evidence of  ecchymosis or hematoma formation. Distal pulses were intact.  Total  fluoroscopic time was 4.4 minutes, total ionized contrast 150 mL.   RESULTS:  Aortic pressure 162/97 with a mean of 126 mmHg.   Left ventricular pressure 162/19, end diastolic pressure of 25 mmHg.   CORONARY ANGIOGRAPHY:  The left main coronary artery is a moderate caliber  vessel which is without significant disease.   The left anterior descending coronary artery is a moderate caliber vessel  with several branching diagonals. It is with luminal irregularities only.   The left circumflex coronary artery is a moderate caliber vessel which has a  ramus intermedius branch which has a 25% ostial stenosis and a large  bifurcating posterior lateral branch. The two obtuse marginals are quite  small. There is no significant disease.   The right coronary artery is a moderate caliber dominant vessel with a  moderate caliber posterior descending and two moderate caliber posterior  lateral branches. It is also without significant disease.   Left ventriculogram reveals ejection fraction of 60% with no wall motion  abnormalities. No mitral regurgitation.   Abdominal aortography reveals widely patent renals and widely patent femoral  arteries.   ASSESSMENT:  1.  Nonobstructive coronary disease.  2.  Normal LV systolic function.  3.  Normal abdominal aortography.  4.  Elevated left heart pressures consistent with hypertension.   PLAN:  Is medical therapy, home after his bedrest today.      Vida Roller, M.D.  Electronically Signed     JH/MEDQ  D:  12/01/2005  T:  12/01/2005  Job:  [id number]

## 2011-04-02 NOTE — Letter (Signed)
January 19, 2007    Leonard [name]. Leonard [name], M.D.  [address], Leonard [name]  Leonard [name], Leonard [name] [address]   RE:  Leonard [name], Leonard [name]  MRN:  [id number]  /  DOB:  10/10/1946   Dear Leonard [name]:   It was a pleasure to see your very nice patient, Leonard [name], for  followup on January 19, 2007.  As you know, he is a very pleasant 65-year-  old obese black male with hypertension, hyperlipidemia and atypical  chest pain.  He has occasional very brief palpitations.  Previous  catheterization revealed nonobstructive coronary artery disease with  normal LV with EF of 60%.   The patient is getting along quite well with no chest discomfort.  He  does have occasional palpitations as noted.  He does not drink coffee  but does have occasional tea.   As you know he had a systolic ejection murmur previously in the echo,  suggesting possible bicuspid aortic valve although there is no evidence  of stenosis.  The LV was 65-70%.  The patient is on Lipitor 10,  Prevacid, Flomax 0.4, aspirin 81, Lotrel 10/20 because of hypertension.   PHYSICAL EXAMINATION:  Blood pressure 140/90, pulse 79, normal sinus  rhythm.  GENERAL APPEARANCE:  Normal JVP, not elevated.  Carotid pulse palpable  and equal without bruits.  LUNGS:  Clear.  CARDIAC:  Exam was 2/6 short systolic ejection murmur at the aortic  area, no diastolic murmur.  ABDOMEN:  Obese.  EXTREMITIES:  With no edema.   EKG reveals borderline LVH with minor nonspecific T changes.   IMPRESSION:  1. Hypertension, fair control.  2. Possible __________ aortic valve.  3. Obesity.   I suggest the patient decrease his sodium intake.  Continue to follow  his blood pressures closely.  He may need additional antihypertension  therapy in the future.   Plan to get a BMP today and have him return to see Leonard [name] in 6  months at which time we will get a follow-up 2D echo.   Thank you for the opportunity in sharing this nice gentleman's care.    Sincerely,      E. Graceann Congress, MD, Leonard [name]  Electronically Signed    EJL/MedQ  DD: 01/19/2007  DT: 01/19/2007  Job #: [id number]

## 2011-04-02 NOTE — H&P (Signed)
NAME:  HILLERY, BHALLA NO.:  [id number]   MEDICAL RECORD NO.:  [id number]            PATIENT TYPE:   LOCATION:                                 FACILITY:   PHYSICIAN:  Cecil Cranker, M.D.     DATE OF BIRTH:   DATE OF ADMISSION:  DATE OF DISCHARGE:                                HISTORY & PHYSICAL   Mr. Brayman is a very pleasant 65 year old black married male with  hypertension, hyperlipidemia, diet controlled diabetes, strong family  history presenting now with week and a half history of mid sternal chest  pain which is relieved promptly with nitroglycerin, but is generally  somewhat atypical in that the onset does not occur with exertion, though  this morning he did have pain while at work.  He also has a history of  abnormal stress test.   Patient describes the pain as dull, persisting until he takes a  nitroglycerin after which is relieved in one to two minutes.  He has no  associated nausea, vomiting, diaphoresis, or dyspnea.   Patient had stress test and coronary angiogram in 1999 by Dr. Garnette Scheuermann.  He states that this did not reveal significant coronary artery disease.  We  do not have the actual reports.   ALLERGIES:  None.   Patient has never smoked.  He does not use alcohol or other drugs.   MEDICATIONS:  1.  Norvasc 10.  2.  Flomax 0.4.  3.  Prevacid.  4.  B6.  5.  Nitroglycerin p.r.n.   PAST HISTORY:  1.  Hospitalization for left knee surgery and thigh surgery in July 1999.      Had repair of quadriceps tendon and resection of left thigh tumor      thought to be lipoma.  2.  He has had mild anemia in the past.  3.  Renal stone.  4.  He has history of gout in March 2003.   REVIEW OF SYSTEMS:  HEENT:  Unremarkable except he does wear glasses.  CARDIORESPIRATORY:  Some dyspnea on exertion.  GI:  History GERD.  GU:  He  has frequency.  He is on Flomax.  He has had a renal stone.  ENDOCRINE:  Negative.  MUSCULOSKELETAL:  Carpal tunnel  syndrome.   FAMILY HISTORY:  Father died of a heart attack at age 70.  Mother died 72s  with diabetes.  One brother has diabetes.  One brother is dead, not heart  related.   SOCIAL HISTORY:  He is a Paramedic.  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  He is  married and has three children.   PHYSICAL EXAMINATION:  VITAL SIGNS:  Blood pressure 140/84, pulse 77, normal  sinus rhythm.  GENERAL APPEARANCE:  Obese.  HEENT/NECK:  JVP is not elevated.  Nose and throat unremarkable.  Carotid  pulses palpated without bruits.  Thyroid normal.  LUNGS:  Clear.  CARDIAC:  2/6 short systolic ejection murmur at aortic area.  No diastolic  murmur.  ABDOMEN:  Obesity.  No masses.  EXTREMITIES:  No edema.  Pulses are good.  IMPRESSION:  1.  Chest pain with abnormal EKG, somewhat atypical, but very concerning for      coronary artery disease with unstable angina with the multiple risk      factors.  2.  Hypertension.  3.  Diabetes, diet controlled.  4.  Gout.  5.  Benign prostatic hypertrophy.  6.  Gastroesophageal reflux disease.  7.  History of lipoma.  8.  History of benign monoclonal gammopathy.  9.  Severe obesity.  10. Systolic ejection murmur, probably aortic sclerosis or mild aortic      stenosis.   Because of the multiple risk factors and somewhat worrisome recurrent chest  pain occurring as recently as this morning associated with some minor  nonspecific T changes on EKG, have suggested admission for therapy and  subsequent coronary angiography.   Patient understands the procedure and the risks associated.  Will also start  him on aspirin and Toprol.  Should he have recurrent chest pain be started  on IV nitroglycerin and heparin.           ______________________________  E. Graceann Congress, M.D.     Audley Hose  D:  11/30/2005  T:  11/30/2005  Job:  [id number]   cc:   [name]. [name], M.D.  Fax: [phone]

## 2011-04-02 NOTE — Telephone Encounter (Signed)
I have checked and have no cmn on this patient, Dr Shelle Iron has signed his CMN

## 2011-04-02 NOTE — Assessment & Plan Note (Signed)
Leonard [name]                              CARDIOLOGY OFFICE NOTE   NAME:DAVISJarrod, [name]                        MRN:          [id number]  DATE:07/26/2006                            DOB:          19-May-1946    ADDENDUM:  We plan a 2-D echo because of the systolic ejection murmur and suspect he  may have aortic sclerosis or mild aortic stenosis.                              Cecil Cranker, MD, [facility]    EJL/MedQ  DD:  07/26/2006  DT:  07/27/2006  Job #:  [id number]

## 2011-04-02 NOTE — Letter (Signed)
July 26, 2006     [name]. [name], M.D.  [address], [place]  [place], [place] [address]   RE:  [name], [name]  MRN:  [id number]  /  DOB:  [birth year]   Dear Eden Emms;   It was a pleasure to see your nice patient Leonard [name] for followup on  July 26, 2006.  As you know, he is a very pleasant 65 year old, obese,  black male with hypertension, hyperlipidemia and atypical chest pain.  Previous catheterization revealed non-obstructive coronary disease with  normal LV function, with EF of 60%.  Further details of the catheterization  were noted on December 23, 2005.  The patient is getting along quite well.  He has had no chest pain or shortness of breath.  He does have occasional  palpitations.  He does not drink coffee, but does have an occasional diet  [facility].   He is on:  1. Prevacid 30 mg daily.  2. Lipitor 10 mg.  3. Norvasc 10 mg.  4. B6.  5. Flomax 0.4.   PHYSICAL EXAMINATION:  Blood pressure is 152/98, pulse 73, normal sinus  rhythm.  GENERAL APPEARANCE:  Normal.  JVP is not elevated, carotid pulses are palpable and equal without bruits.  LUNGS:  Clear.  CARDIAC:  Exam reveals 2/6 short systolic ejection murmur at the aortic  area, no diastolic murmur.  ABDOMEN:  Obese.  EXTREMITIES:  Normal.   EKG normal sinus rhythm with PACs, mild nonspecific T changes.   IMPRESSION:  1. Palpitations, probably related to premature atrial contractions.  2. Hypertension.  3. Non-obstructive coronary disease.  4. Hyperlipidemia.   I suggested that the patient change from Norvasc 10 mg to Lotrel 5/20 mg  daily.  In addition, I have suggested he take aspirin 81 mg daily.   He is to have some routine blood work with you this week.  Because of the  initiation of Lotrel, I would like to repeat the BNP in approximately six  weeks.   Thanks for the opportunity to share in this nice gentleman's care.  I would  like to see him back in 3 to 4 months, or earlier if you so  desire.  Best  regards.    Sincerely,      E. Graceann Congress, MD, [facility]   EJL/MedQ  DD:  07/26/2006  DT:  07/27/2006  Job #:  [id number]

## 2011-04-23 ENCOUNTER — Other Ambulatory Visit: Payer: Self-pay | Admitting: Internal Medicine

## 2011-04-26 ENCOUNTER — Encounter: Payer: Self-pay | Admitting: Internal Medicine

## 2011-04-26 ENCOUNTER — Ambulatory Visit (INDEPENDENT_AMBULATORY_CARE_PROVIDER_SITE_OTHER): Payer: Federal, State, Local not specified - PPO | Admitting: Internal Medicine

## 2011-04-26 VITALS — BP 139/82 | HR 80 | Temp 97.2°F | Ht 69.25 in | Wt 286.0 lb

## 2011-04-26 DIAGNOSIS — I10 Essential (primary) hypertension: Secondary | ICD-10-CM

## 2011-04-26 DIAGNOSIS — G5603 Carpal tunnel syndrome, bilateral upper limbs: Secondary | ICD-10-CM

## 2011-04-26 DIAGNOSIS — E119 Type 2 diabetes mellitus without complications: Secondary | ICD-10-CM

## 2011-04-26 DIAGNOSIS — G629 Polyneuropathy, unspecified: Secondary | ICD-10-CM

## 2011-04-26 DIAGNOSIS — E785 Hyperlipidemia, unspecified: Secondary | ICD-10-CM

## 2011-04-26 DIAGNOSIS — G609 Hereditary and idiopathic neuropathy, unspecified: Secondary | ICD-10-CM

## 2011-04-26 DIAGNOSIS — M545 Low back pain: Secondary | ICD-10-CM

## 2011-04-26 DIAGNOSIS — E669 Obesity, unspecified: Secondary | ICD-10-CM

## 2011-04-26 DIAGNOSIS — G4730 Sleep apnea, unspecified: Secondary | ICD-10-CM

## 2011-04-26 DIAGNOSIS — B360 Pityriasis versicolor: Secondary | ICD-10-CM

## 2011-04-26 DIAGNOSIS — G5600 Carpal tunnel syndrome, unspecified upper limb: Secondary | ICD-10-CM

## 2011-04-26 DIAGNOSIS — K219 Gastro-esophageal reflux disease without esophagitis: Secondary | ICD-10-CM

## 2011-05-06 ENCOUNTER — Other Ambulatory Visit: Payer: Federal, State, Local not specified - PPO | Admitting: Internal Medicine

## 2011-05-07 ENCOUNTER — Encounter: Payer: Federal, State, Local not specified - PPO | Admitting: Internal Medicine

## 2011-05-15 ENCOUNTER — Encounter: Payer: Self-pay | Admitting: Internal Medicine

## 2011-05-15 DIAGNOSIS — G629 Polyneuropathy, unspecified: Secondary | ICD-10-CM | POA: Insufficient documentation

## 2011-05-15 DIAGNOSIS — G5603 Carpal tunnel syndrome, bilateral upper limbs: Secondary | ICD-10-CM | POA: Insufficient documentation

## 2011-05-15 DIAGNOSIS — K219 Gastro-esophageal reflux disease without esophagitis: Secondary | ICD-10-CM | POA: Insufficient documentation

## 2011-05-15 NOTE — Progress Notes (Signed)
  Subjective:    Patient ID: Leonard [name], male    DOB: [birth year], 65 y.o.   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 Doesn't get much exercise. Pain is in left lower back radiating into the buttock. No weakness of legs. No bowel or bladder problems. Had previous episode in 2007.    Review of Systems     Objective:   Physical Exam straight leg raising is negative at 90 bilaterally. Muscle strength is 5/5 in lower extremities. Sensation is decreased in lower extremities bilaterally. Dorsalis pedis pulses are present bilaterally. Deep tendon reflexes 1+ in the knees and absent in the ankles bilaterally.        Assessment & Plan:  Low back strain-? Early sciatica  Plan is to give him Sterapred DS 10 mg six-day Dosepak, Vicodin 5/500 (#40 (1 by mouth every 6 hours when necessary pain. Flexeril 10 mg (#30 )1 by mouth each bedtime with refills. To be out of work today and the next 2 days- return on June 14

## 2011-05-30 ENCOUNTER — Other Ambulatory Visit: Payer: Self-pay | Admitting: Internal Medicine

## 2011-06-06 ENCOUNTER — Other Ambulatory Visit: Payer: Self-pay | Admitting: Internal Medicine

## 2011-06-10 ENCOUNTER — Ambulatory Visit (INDEPENDENT_AMBULATORY_CARE_PROVIDER_SITE_OTHER): Payer: Federal, State, Local not specified - PPO | Admitting: Internal Medicine

## 2011-06-10 ENCOUNTER — Encounter: Payer: Self-pay | Admitting: Internal Medicine

## 2011-06-10 DIAGNOSIS — M109 Gout, unspecified: Secondary | ICD-10-CM

## 2011-06-10 NOTE — Progress Notes (Signed)
  Subjective:    Patient ID: Leonard [name], male    DOB: 1946/10/30, 65 y.o.   MRN: [id number]  HPI  Pt has had flare of gout left 1st MTP joint after eating crab meat. Started on Prednisone which he had leftover from back problem but ran out and is not well yet. Had stopped taking Allopurinol some time ago because had not had an attack in some time.    Review of Systems     Objective:   Physical Exam Inflamed and swollen left 1st MTP joint. Foot mildly swollen as is great toe        Assessment & Plan:  Gout: Treat with Sterapred DS 10 mg 12 day dosepack. Take as directed with food. Restart:   Allopurinol 300 daily in one week. Given #30 with prn 1 year refills.

## 2011-06-11 ENCOUNTER — Encounter: Payer: Self-pay | Admitting: Internal Medicine

## 2011-06-11 DIAGNOSIS — Z8739 Personal history of other diseases of the musculoskeletal system and connective tissue: Secondary | ICD-10-CM | POA: Insufficient documentation

## 2011-06-11 NOTE — Patient Instructions (Signed)
Take Sterapred DS 10 mg 12 day dosepak as prescribed. In one week start allopurinol 300 mg daily. Allopurinol as a preventative for gout and should be taken indefinitely

## 2011-06-24 ENCOUNTER — Other Ambulatory Visit: Payer: Federal, State, Local not specified - PPO | Admitting: Internal Medicine

## 2011-06-24 DIAGNOSIS — E119 Type 2 diabetes mellitus without complications: Secondary | ICD-10-CM

## 2011-06-24 DIAGNOSIS — I10 Essential (primary) hypertension: Secondary | ICD-10-CM

## 2011-06-24 DIAGNOSIS — Z125 Encounter for screening for malignant neoplasm of prostate: Secondary | ICD-10-CM

## 2011-06-24 DIAGNOSIS — E785 Hyperlipidemia, unspecified: Secondary | ICD-10-CM

## 2011-06-24 LAB — CBC WITH DIFFERENTIAL/PLATELET
Eosinophils Absolute: 0 10*3/uL (ref 0.0–0.7)
Hemoglobin: 11.7 g/dL — ABNORMAL LOW (ref 13.0–17.0)
Lymphs Abs: 5.5 10*3/uL — ABNORMAL HIGH (ref 0.7–4.0)
MCHC: 32 g/dL (ref 30.0–36.0)
Neutro Abs: 8 10*3/uL — ABNORMAL HIGH (ref 1.7–7.7)
Neutrophils Relative %: 54 % (ref 43–77)
RBC: 4.3 MIL/uL (ref 4.22–5.81)
RDW: 16.6 % — ABNORMAL HIGH (ref 11.5–15.5)

## 2011-06-24 LAB — LIPID PANEL
HDL: 100 mg/dL (ref >39)
LDL Cholesterol: 75 mg/dL (ref 0–99)
Total CHOL/HDL Ratio: 1.9 Ratio
VLDL: 10 mg/dL (ref 0–40)

## 2011-06-24 LAB — HEPATIC FUNCTION PANEL
Albumin: 4.2 g/dL (ref 3.5–5.2)
Alkaline Phosphatase: 72 U/L (ref 39–117)
Bilirubin, Direct: 0.1 mg/dL (ref 0.0–0.3)
Total Bilirubin: 0.5 mg/dL (ref 0.3–1.2)

## 2011-06-24 LAB — PSA: PSA: 2.84 ng/mL (ref <=4.00)

## 2011-06-24 LAB — BASIC METABOLIC PANEL: Sodium: 139 mEq/L (ref 135–145)

## 2011-06-24 LAB — HEMOGLOBIN A1C: Hgb A1c MFr Bld: 7.9 % — ABNORMAL HIGH (ref <5.7)

## 2011-06-25 ENCOUNTER — Other Ambulatory Visit: Payer: Self-pay | Admitting: Internal Medicine

## 2011-06-25 ENCOUNTER — Encounter: Payer: Self-pay | Admitting: Internal Medicine

## 2011-06-25 ENCOUNTER — Ambulatory Visit (INDEPENDENT_AMBULATORY_CARE_PROVIDER_SITE_OTHER): Payer: Federal, State, Local not specified - PPO | Admitting: Internal Medicine

## 2011-06-25 DIAGNOSIS — E119 Type 2 diabetes mellitus without complications: Secondary | ICD-10-CM

## 2011-06-25 DIAGNOSIS — E785 Hyperlipidemia, unspecified: Secondary | ICD-10-CM

## 2011-06-25 DIAGNOSIS — I2510 Atherosclerotic heart disease of native coronary artery without angina pectoris: Secondary | ICD-10-CM

## 2011-06-25 DIAGNOSIS — I10 Essential (primary) hypertension: Secondary | ICD-10-CM

## 2011-06-25 DIAGNOSIS — K219 Gastro-esophageal reflux disease without esophagitis: Secondary | ICD-10-CM

## 2011-06-25 DIAGNOSIS — Z0000 Encounter for general adult medical examination without abnormal findings: Secondary | ICD-10-CM

## 2011-06-25 DIAGNOSIS — M109 Gout, unspecified: Secondary | ICD-10-CM

## 2011-06-25 LAB — POCT URINALYSIS DIPSTICK
Leukocytes, UA: NEGATIVE
Nitrite, UA: NEGATIVE
Protein, UA: NEGATIVE
pH, UA: 5

## 2011-06-26 LAB — MICROALBUMIN, URINE: Microalb, Ur: 1.23 mg/dL (ref 0.00–1.89)

## 2011-08-09 ENCOUNTER — Encounter: Payer: Self-pay | Admitting: Internal Medicine

## 2011-08-11 ENCOUNTER — Encounter: Payer: Self-pay | Admitting: Internal Medicine

## 2011-08-12 NOTE — Progress Notes (Signed)
  Subjective:    Patient ID: Leonard [name], male    DOB: 1946-04-04, 65 y.o.   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 History of kidney stone approximately 1996. Low back pain 2007 and has been recurrent recently. Left knee surgery February 1999 and again July 1999. Pneumovax 2007, T. dap  vaccine June 2011. Colonoscopy February 2011. This was done by Dr. [name].  Was out of work in June with back pain. Sees Dr. Shelle Iron for obstructive sleep apnea and has CPAP which more recently he's been compliant with. Sees Dr. Dietrich Pates for hyperlipidemia, hypertension and nonobstructive coronary artery disease. Has had a previous cardiac catheterization a number of years ago with normal left ventricular function and ejection fraction of 60%.    Review of Systems  Constitutional: Negative.   HENT: Negative.   Eyes: Negative.   Respiratory: Negative.   Cardiovascular: Negative.   Gastrointestinal: Negative.   Genitourinary: Negative.   Musculoskeletal: Positive for back pain.  Neurological: Negative.   Hematological: Negative.   Psychiatric/Behavioral: Negative.        Objective:   Physical Exam  Vitals reviewed. Constitutional: He is oriented to person, place, and time. He appears well-nourished. No distress.  HENT:  Head: Normocephalic and atraumatic.  Right Ear: External ear normal.  Left Ear: External ear normal.  Mouth/Throat: Oropharynx is clear and moist.  Eyes: EOM are normal. Pupils are equal, round, and reactive to light. Right eye exhibits no discharge. Left eye exhibits no discharge. No scleral icterus.  Neck: No JVD present. No  thyromegaly present.  Cardiovascular: Normal rate, regular rhythm, normal heart sounds and intact distal pulses.   No murmur heard. Pulmonary/Chest: He has no wheezes.  Abdominal: Soft. Bowel sounds are normal. He exhibits no mass. There is no tenderness.  Genitourinary:       Deferred to urologist  Musculoskeletal: He exhibits no edema.  Lymphadenopathy:    He has no cervical adenopathy.  Neurological: He is alert and oriented to person, place, and time. He has normal reflexes. Coordination normal.  Skin: Skin is warm and dry. He is not diaphoretic.  Psychiatric: He has a normal mood and affect.          Assessment & Plan:  History of nonobstructive coronary artery disease  Hypertension  Hyperlipidemia  Adult onset diabetes diet controlled  Benign prostatic hypertrophy treated by urologist with Flomax  History of gout  History of GE reflux  History of sleep apnea  Patient is to return in 6 months for fasting lipid panel liver functions blood pressure check A1c and office visit

## 2011-08-13 ENCOUNTER — Ambulatory Visit (INDEPENDENT_AMBULATORY_CARE_PROVIDER_SITE_OTHER): Payer: Federal, State, Local not specified - PPO | Admitting: Internal Medicine

## 2011-08-13 ENCOUNTER — Encounter: Payer: Self-pay | Admitting: Internal Medicine

## 2011-08-13 VITALS — BP 154/89 | HR 78 | Ht 69.0 in | Wt 289.1 lb

## 2011-08-13 DIAGNOSIS — E669 Obesity, unspecified: Secondary | ICD-10-CM

## 2011-08-13 DIAGNOSIS — I10 Essential (primary) hypertension: Secondary | ICD-10-CM

## 2011-08-13 DIAGNOSIS — R0789 Other chest pain: Secondary | ICD-10-CM

## 2011-08-13 DIAGNOSIS — I2510 Atherosclerotic heart disease of native coronary artery without angina pectoris: Secondary | ICD-10-CM | POA: Insufficient documentation

## 2011-08-13 DIAGNOSIS — G4733 Obstructive sleep apnea (adult) (pediatric): Secondary | ICD-10-CM

## 2011-08-13 DIAGNOSIS — R079 Chest pain, unspecified: Secondary | ICD-10-CM

## 2011-08-13 NOTE — Assessment & Plan Note (Signed)
Minimal by cath in 2007.  I am not convinced pain or pressure is due to CAD.  Get echo.  Keep on meds.

## 2011-08-13 NOTE — Assessment & Plan Note (Signed)
Use CPAP. 

## 2011-08-13 NOTE — Progress Notes (Signed)
HPI Leonard [name] is a 65 year old gentleman with a history of hypertenison,minimal CAD by cath in 2007,  sleep apnea and dyslpidemia. I saw him in clinic 1 year ago.   i saw him in clinic last September. Since seen  Doing OK.  Seen by Dr [name] who said heart didn't sound right. Hasn't had chest pain.  Had occasional sharp  Stabbing.  Takes NTG  Goes away in 1 minute.  Last spell 2 wks ago.  Just sitting at house.  Not with activty Has some dull pressure in chest   Is having now   Began a couple wks ago. Not pleuritic. Breathing OK.  Was jogging in place 150 steps.  Stopped.3 to 5 minutes.  Last did this about 1 month ago.  No real reason why stopped.   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 Weight initially was 298.  Now 289. Breathing about the same   No SOB. Allergies  Allergen Reactions  . Dicloxacillin Rash    Current Outpatient Prescriptions  Medication Sig Dispense Refill  . allopurinol (ZYLOPRIM) 300 MG tablet Take 300 mg by mouth daily.        Marland Kitchen amLODipine-benazepril (LOTREL) 10-20 MG per capsule Take 1 capsule by mouth daily.        Marland Kitchen aspirin 81 MG tablet Take 81 mg by mouth daily.        Marland Kitchen atorvastatin (LIPITOR) 10 MG tablet TAKE 1 TABLET EVERY DAY  90 tablet  0  . esomeprazole (NEXIUM) 40 MG capsule Take 40 mg by mouth daily before breakfast.        . furosemide (LASIX) 40 MG tablet TAKE 1 TABLET BY MOUTH EVERY DAY  90 tablet  0  . NITROSTAT 0.4 MG SL tablet       . Tamsulosin HCl (FLOMAX) 0.4 MG CAPS Take 0.4 mg by mouth daily as needed.       . Testosterone (ANDROGEL PUMP) 1.25 GM/ACT (1%) GEL Apply once daily as directed       . TRADJENTA 5 MG TABS tablet         Past Medical History  Diagnosis Date  . Type II or unspecified type diabetes mellitus without mention of complication, not stated as uncontrolled   . Gouty arthropathy   . Calculus of kidney   . Other and unspecified hyperlipidemia   . Unspecified essential hypertension   . OSA  (obstructive sleep apnea)     Past Surgical History  Procedure Date  . Knee surgery     Family History  Problem Relation Age of Onset  . Heart attack Father   . Diabetes Mother   . Diabetes Brother     History   Social History  . Marital Status: Married    Spouse Name: N/A    Number of Children: 3  . Years of Education: N/A   Occupational History  . Paramedic    Social History Main Topics  . Smoking status: Never Smoker   . Smokeless tobacco: Not on file  . Alcohol Use: Not on file  . Drug Use: Not on file  . Sexually Active: Not on file   Other Topics Concern  . Not on file   Social History Narrative  . No narrative on file    Review of Systems:  All systems reviewed.  They are negative to the above problem except as previously stated.  Vital Signs: BP 154/89  Pulse 78  Ht 5'  9" (1.753 m)  Wt 289 lb 1.9 oz (131.144 kg)  BMI 42.70 kg/m2  Physical Exam  Pateint is in NAD.  HEENT:  Normocephalic, atraumatic. EOMI, PERRLA.  Neck: JVP is normal. No thyromegaly. No bruits.  Lungs: clear to auscultation. No rales no wheezes.  Heart: Regular rate and rhythm. Normal S1, S2. No S3.   Gr. II/VI systolic murmur LSB. PMI not displaced.  Abdomen:  Supple, nontender. Normal bowel sounds. No masses. No hepatomegaly.  Extremities:   Good distal pulses throughout. No lower extremity edema.  Musculoskeletal :moving all extremities.  Neuro:   alert and oriented x3.  CN II-XII grossly intact.  EKG  SInus rhythm  76.  LVH.   Assessment and Plan:

## 2011-08-13 NOTE — Assessment & Plan Note (Signed)
SPent a long time talking about weight and the med problems that stem from extra wt.  I encouraged him to look into [employer].  Would like to see him lose at least 30 lbs.

## 2011-08-13 NOTE — Assessment & Plan Note (Addendum)
BP is high.  In her office was 124.  WIll follow.  Needs to use CPAP and lose wt.

## 2011-08-13 NOTE — Assessment & Plan Note (Signed)
Lipids look good.  Keep on lipitor.

## 2011-08-13 NOTE — Patient Instructions (Addendum)
Your physician has requested that you have an echocardiogram. Echocardiography is a painless test that uses sound waves to create images of your heart. It provides your doctor with information about the size and shape of your heart and how well your heart's chambers and valves are working. This procedure takes approximately one hour. There are no restrictions for this procedure.  Your physician wants you to follow-up in: 12 months You will receive a reminder letter in the mail two months in advance. If you don't receive a letter, please call our office to schedule the follow-up appointment.  

## 2011-08-13 NOTE — Assessment & Plan Note (Addendum)
I am not sure what this represents.  Not a pain   Not associated with activity.  Having now.  Takes nexium, denies breakthrough. Will get echo  Eval pulmonary pressures.  Consider myoview.  Activities as tolerated.

## 2011-08-27 ENCOUNTER — Ambulatory Visit (HOSPITAL_COMMUNITY): Payer: Federal, State, Local not specified - PPO | Attending: Internal Medicine | Admitting: Radiology

## 2011-08-27 DIAGNOSIS — I10 Essential (primary) hypertension: Secondary | ICD-10-CM | POA: Insufficient documentation

## 2011-08-27 DIAGNOSIS — I2510 Atherosclerotic heart disease of native coronary artery without angina pectoris: Secondary | ICD-10-CM | POA: Insufficient documentation

## 2011-08-27 DIAGNOSIS — R079 Chest pain, unspecified: Secondary | ICD-10-CM | POA: Insufficient documentation

## 2011-08-27 DIAGNOSIS — E785 Hyperlipidemia, unspecified: Secondary | ICD-10-CM | POA: Insufficient documentation

## 2011-08-27 DIAGNOSIS — E669 Obesity, unspecified: Secondary | ICD-10-CM | POA: Insufficient documentation

## 2011-08-27 DIAGNOSIS — E119 Type 2 diabetes mellitus without complications: Secondary | ICD-10-CM | POA: Insufficient documentation

## 2011-08-27 DIAGNOSIS — R0789 Other chest pain: Secondary | ICD-10-CM

## 2011-08-27 DIAGNOSIS — G4730 Sleep apnea, unspecified: Secondary | ICD-10-CM | POA: Insufficient documentation

## 2011-09-08 ENCOUNTER — Other Ambulatory Visit: Payer: Self-pay | Admitting: Internal Medicine

## 2011-09-27 ENCOUNTER — Ambulatory Visit: Payer: Federal, State, Local not specified - PPO | Admitting: Internal Medicine

## 2011-09-28 ENCOUNTER — Encounter: Payer: Self-pay | Admitting: Internal Medicine

## 2011-09-28 ENCOUNTER — Ambulatory Visit (INDEPENDENT_AMBULATORY_CARE_PROVIDER_SITE_OTHER): Payer: Federal, State, Local not specified - PPO | Admitting: Internal Medicine

## 2011-09-28 DIAGNOSIS — G4730 Sleep apnea, unspecified: Secondary | ICD-10-CM

## 2011-09-28 DIAGNOSIS — I10 Essential (primary) hypertension: Secondary | ICD-10-CM

## 2011-09-28 DIAGNOSIS — E785 Hyperlipidemia, unspecified: Secondary | ICD-10-CM

## 2011-09-28 DIAGNOSIS — M109 Gout, unspecified: Secondary | ICD-10-CM

## 2011-09-28 DIAGNOSIS — K219 Gastro-esophageal reflux disease without esophagitis: Secondary | ICD-10-CM

## 2011-09-28 DIAGNOSIS — E119 Type 2 diabetes mellitus without complications: Secondary | ICD-10-CM

## 2011-09-28 NOTE — Progress Notes (Signed)
  Subjective:    Patient ID: Leonard [name], male    DOB: [birth year], 65 y.o.   MRN: [id number]  HPI  Here for recheck on diabetes. Out of diabetic meds for 2 days. Was drinking sweet tea and non- diet soda. Spoke at length today about diet and exercise. Hx hypertension, hyperlipidemia, AODM,sleep apnea, GE reflux, gout. No recent gout attacks. Uses CPAP machine. Needs to get serious about diet and exercise. Influenza immunization given today. Reminded about diabetic eye exam- goes to [facility]. Saw Dr. Tenny Craw in October and had 2 D Echo. Had cath 2007.Has asymmetrical left ventricular hypertrophy with ejection fraction 55-65%.      Review of Systems     Objective:   Physical Exam Chest:clear, Cor:RRR; EXB:MWUXLKG edema. Tinea pedis both feet. No decrease sensation in feet. Dorsalis pedis pulses 1+ bilaterally absent posterior tibial bilaterally.        Assessment & Plan:  Diabetes mellitus Hyperlipidemia HTN Sleep apnea GERD Gout Plan Influenza vaccine given. Savings card for Tradjenta plus samples for 4 weeks. Return April for 6 month recheck with fasting lipid panel, liver functions and Hgb AIC. Needs to get eye exam. Needs to lose weight.Spectazole cream for Tinea pedis daily

## 2011-09-28 NOTE — Patient Instructions (Signed)
Try the diet exercise and lose weight. Continue with Tradjenta daily. Watch calories. Avoid sweet tea and non-diet soda. Try to exercise on a daily basis.

## 2011-11-18 ENCOUNTER — Other Ambulatory Visit: Payer: Self-pay | Admitting: Internal Medicine

## 2012-02-01 ENCOUNTER — Other Ambulatory Visit: Payer: Self-pay | Admitting: Internal Medicine

## 2012-02-21 ENCOUNTER — Other Ambulatory Visit: Payer: Self-pay | Admitting: Internal Medicine

## 2012-02-21 DIAGNOSIS — H40029 Open angle with borderline findings, high risk, unspecified eye: Secondary | ICD-10-CM | POA: Insufficient documentation

## 2012-02-22 ENCOUNTER — Other Ambulatory Visit: Payer: Federal, State, Local not specified - PPO | Admitting: Internal Medicine

## 2012-02-22 DIAGNOSIS — E785 Hyperlipidemia, unspecified: Secondary | ICD-10-CM

## 2012-02-22 DIAGNOSIS — Z79899 Other long term (current) drug therapy: Secondary | ICD-10-CM

## 2012-02-22 LAB — HEPATIC FUNCTION PANEL
ALT: 13 U/L (ref 0–53)
AST: 14 U/L (ref 0–37)
Bilirubin, Direct: 0.1 mg/dL (ref 0.0–0.3)
Total Protein: 6.7 g/dL (ref 6.0–8.3)

## 2012-02-22 LAB — LIPID PANEL
HDL: 68 mg/dL (ref >39)
LDL Cholesterol: 49 mg/dL (ref 0–99)
Total CHOL/HDL Ratio: 1.9 Ratio
Triglycerides: 47 mg/dL (ref <150)
VLDL: 9 mg/dL (ref 0–40)

## 2012-02-22 LAB — HEMOGLOBIN A1C: Hgb A1c MFr Bld: 7 % — ABNORMAL HIGH (ref <5.7)

## 2012-02-24 ENCOUNTER — Encounter: Payer: Self-pay | Admitting: Internal Medicine

## 2012-02-24 ENCOUNTER — Ambulatory Visit (INDEPENDENT_AMBULATORY_CARE_PROVIDER_SITE_OTHER): Payer: Federal, State, Local not specified - PPO | Admitting: Internal Medicine

## 2012-02-24 VITALS — BP 128/74 | HR 76 | Temp 98.0°F | Wt 284.0 lb

## 2012-02-24 DIAGNOSIS — E119 Type 2 diabetes mellitus without complications: Secondary | ICD-10-CM

## 2012-02-24 DIAGNOSIS — Z862 Personal history of diseases of the blood and blood-forming organs and certain disorders involving the immune mechanism: Secondary | ICD-10-CM

## 2012-02-24 DIAGNOSIS — Z8739 Personal history of other diseases of the musculoskeletal system and connective tissue: Secondary | ICD-10-CM

## 2012-02-24 DIAGNOSIS — E669 Obesity, unspecified: Secondary | ICD-10-CM

## 2012-02-24 DIAGNOSIS — I10 Essential (primary) hypertension: Secondary | ICD-10-CM

## 2012-02-24 DIAGNOSIS — T783XXA Angioneurotic edema, initial encounter: Secondary | ICD-10-CM

## 2012-02-24 DIAGNOSIS — E785 Hyperlipidemia, unspecified: Secondary | ICD-10-CM

## 2012-02-24 DIAGNOSIS — E1169 Type 2 diabetes mellitus with other specified complication: Secondary | ICD-10-CM

## 2012-02-24 DIAGNOSIS — G4730 Sleep apnea, unspecified: Secondary | ICD-10-CM

## 2012-02-24 MED ORDER — METHYLPREDNISOLONE ACETATE 80 MG/ML IJ SUSP
80.0000 mg | Freq: Once | INTRAMUSCULAR | Status: AC
  Administered 2012-02-24: 80 mg via INTRAMUSCULAR

## 2012-02-24 NOTE — Patient Instructions (Signed)
You have been given prescription for EpiPen to use at onset of angioedema with refills for a year today. Take Sterapred DS 10 mg 6 day dosepak for angioedema starting today. Return in 6 months for physical exam. Continue same medications.

## 2012-02-24 NOTE — Progress Notes (Signed)
  Subjective:    Patient ID: Leonard [name], male    DOB: 1946/06/20, 66 y.o.   MRN: [id number]  HPI  66 year old black male in today for six-month followup of hypertension, diabetes mellitus, sleep apnea, history of gout, hyperlipidemia. Lipid panel liver functions are within normal limits. Hemoglobin A1c is 7%. He remains overweight and has been unable to lose weight. Says he's cut back on sodas and sweet tea. Would like to see hemoglobin A1c less than 7%. He's not checking his Accu-Cheks on a regular basis which is disappointing.  Today as a new problem. Has significant angioedema of lower lip. Says on Tuesday, April 9 that he ate at [place] and had some barbecue containing cayenne pepper which she says causes angioedema. Says reaction did not occur in fall yesterday. He felt a "bump" starting in lower lip. He has had in the past a prescription for EpiPen but apparently it had expired. He has no respiratory distress with these episodes.  Says he has seen ophthalmologist at [facility] per locational demonstrate for complete eye exam on Monday, April 8. Was not told he had any diabetic retinopathy.    Review of Systems     Objective:   Physical Exam neck is supple without JVD or carotid bruits, chest clear, cardiac exam regular rate and rhythm. Has significant angioedema of lower lip.        Assessment & Plan:  Angioedema lower lip  Hypertension  Hyperlipidemia  Diabetes mellitus  Sleep apnea-treated with CPAP  Plan: Depo-Medrol 80 mg IM. Sterapred DS 10 mg 6 day dosepak take hysterectomy for angioedema of the lip. Continue same medications as previously prescribed. Return in 6 months for physical exam. Needs to get influenza immunization this fall.

## 2012-03-10 ENCOUNTER — Ambulatory Visit (INDEPENDENT_AMBULATORY_CARE_PROVIDER_SITE_OTHER): Payer: Federal, State, Local not specified - PPO | Admitting: Pulmonary Disease

## 2012-03-10 ENCOUNTER — Encounter: Payer: Self-pay | Admitting: Pulmonary Disease

## 2012-03-10 VITALS — BP 138/64 | HR 76 | Temp 98.1°F | Ht 69.0 in | Wt 291.4 lb

## 2012-03-10 DIAGNOSIS — G4733 Obstructive sleep apnea (adult) (pediatric): Secondary | ICD-10-CM

## 2012-03-10 NOTE — Assessment & Plan Note (Signed)
The patient is doing very well on CPAP currently, with no tolerance issues.  He feels that he sleeps well with the device, and is satisfied with his daytime alertness.  I have asked him to keep up with his mask changes and supplies, and to work more aggressively on weight loss.

## 2012-03-10 NOTE — Patient Instructions (Signed)
Continue with cpap. Work on weight loss followup with me in one year.  

## 2012-03-10 NOTE — Progress Notes (Signed)
  Subjective:    Patient ID: Leonard [name], male    DOB: [birth year], 66 y.o.   MRN: [id number]  HPI The patient comes in today for followup of his known obstructive sleep apnea.  He is wearing CPAP compliantly, and feels that he sleeps well with the device with adequate daytime alertness.  He is having no issues with his mask or pressure, but is due for new supplies.  His weight is down 2 pounds since the last visit, but I have encouraged him to work a little harder on this.   Review of Systems  Constitutional: Negative for fever and unexpected weight change.  HENT: Negative for ear pain, nosebleeds, congestion, sore throat, rhinorrhea, sneezing, trouble swallowing, dental problem, postnasal drip and sinus pressure.   Eyes: Negative for redness and itching.  Respiratory: Negative for cough, chest tightness, shortness of breath and wheezing.   Cardiovascular: Negative for palpitations and leg swelling.  Gastrointestinal: Negative for nausea and vomiting.  Genitourinary: Negative for dysuria.  Musculoskeletal: Negative for joint swelling.  Skin: Negative for rash.  Neurological: Negative for headaches.  Hematological: Does not bruise/bleed easily.  Psychiatric/Behavioral: Negative for dysphoric mood. The patient is not nervous/anxious.        Objective:   Physical Exam Obese male in nad No skin breakdown or pressure necrosis from cpap mask LE with mild edema, no cyanosis Alert and oriented, does not appear sleepy, moves all 4.        Assessment & Plan:

## 2012-04-29 ENCOUNTER — Other Ambulatory Visit: Payer: Self-pay | Admitting: Internal Medicine

## 2012-06-01 ENCOUNTER — Other Ambulatory Visit: Payer: Self-pay | Admitting: Internal Medicine

## 2012-06-29 ENCOUNTER — Other Ambulatory Visit: Payer: Self-pay | Admitting: Internal Medicine

## 2012-08-01 ENCOUNTER — Other Ambulatory Visit: Payer: Self-pay | Admitting: Internal Medicine

## 2012-08-24 ENCOUNTER — Other Ambulatory Visit: Payer: Self-pay

## 2012-08-25 NOTE — Telephone Encounter (Signed)
Samples of Nexium 40 mg given to patient until prior authorization is obtained

## 2012-08-31 ENCOUNTER — Other Ambulatory Visit: Payer: Federal, State, Local not specified - PPO | Admitting: Internal Medicine

## 2012-08-31 DIAGNOSIS — I10 Essential (primary) hypertension: Secondary | ICD-10-CM

## 2012-08-31 DIAGNOSIS — E119 Type 2 diabetes mellitus without complications: Secondary | ICD-10-CM

## 2012-08-31 DIAGNOSIS — E785 Hyperlipidemia, unspecified: Secondary | ICD-10-CM

## 2012-08-31 DIAGNOSIS — Z125 Encounter for screening for malignant neoplasm of prostate: Secondary | ICD-10-CM

## 2012-08-31 DIAGNOSIS — Z79899 Other long term (current) drug therapy: Secondary | ICD-10-CM

## 2012-08-31 LAB — COMPREHENSIVE METABOLIC PANEL
Alkaline Phosphatase: 69 U/L (ref 39–117)
BUN: 22 mg/dL (ref 6–23)
Glucose, Bld: 87 mg/dL (ref 70–99)
Total Bilirubin: 0.5 mg/dL (ref 0.3–1.2)

## 2012-08-31 LAB — LIPID PANEL
HDL: 72 mg/dL (ref >39)
LDL Cholesterol: 84 mg/dL (ref 0–99)
Total CHOL/HDL Ratio: 2.3 Ratio
Triglycerides: 61 mg/dL (ref <150)
VLDL: 12 mg/dL (ref 0–40)

## 2012-08-31 LAB — CBC WITH DIFFERENTIAL/PLATELET
Basophils Relative: 0 % (ref 0–1)
Eosinophils Absolute: 0.2 10*3/uL (ref 0.0–0.7)
Hemoglobin: 10.5 g/dL — ABNORMAL LOW (ref 13.0–17.0)
MCH: 25.7 pg — ABNORMAL LOW (ref 26.0–34.0)
MCHC: 31.2 g/dL (ref 30.0–36.0)
Monocytes Absolute: 0.6 10*3/uL (ref 0.1–1.0)
Monocytes Relative: 7 % (ref 3–12)
Neutrophils Relative %: 62 % (ref 43–77)
RDW: 17.4 % — ABNORMAL HIGH (ref 11.5–15.5)

## 2012-09-01 ENCOUNTER — Encounter: Payer: Self-pay | Admitting: Internal Medicine

## 2012-09-01 ENCOUNTER — Ambulatory Visit (INDEPENDENT_AMBULATORY_CARE_PROVIDER_SITE_OTHER): Payer: Federal, State, Local not specified - PPO | Admitting: Internal Medicine

## 2012-09-01 ENCOUNTER — Other Ambulatory Visit: Payer: Self-pay | Admitting: Internal Medicine

## 2012-09-01 VITALS — BP 134/68 | HR 84 | Temp 97.8°F | Ht 68.25 in | Wt 286.0 lb

## 2012-09-01 DIAGNOSIS — E291 Testicular hypofunction: Secondary | ICD-10-CM

## 2012-09-01 DIAGNOSIS — Z860100 Personal history of colon polyps, unspecified: Secondary | ICD-10-CM

## 2012-09-01 DIAGNOSIS — Z87898 Personal history of other specified conditions: Secondary | ICD-10-CM

## 2012-09-01 DIAGNOSIS — D649 Anemia, unspecified: Secondary | ICD-10-CM

## 2012-09-01 DIAGNOSIS — I10 Essential (primary) hypertension: Secondary | ICD-10-CM

## 2012-09-01 DIAGNOSIS — D509 Iron deficiency anemia, unspecified: Secondary | ICD-10-CM

## 2012-09-01 DIAGNOSIS — Z8639 Personal history of other endocrine, nutritional and metabolic disease: Secondary | ICD-10-CM

## 2012-09-01 DIAGNOSIS — Z862 Personal history of diseases of the blood and blood-forming organs and certain disorders involving the immune mechanism: Secondary | ICD-10-CM

## 2012-09-01 DIAGNOSIS — R7989 Other specified abnormal findings of blood chemistry: Secondary | ICD-10-CM

## 2012-09-01 LAB — POCT URINALYSIS DIPSTICK
Glucose, UA: NEGATIVE
Ketones, UA: NEGATIVE
Spec Grav, UA: 1.015

## 2012-09-01 LAB — VITAMIN B12: Vitamin B-12: 485 pg/mL (ref 211–911)

## 2012-09-01 LAB — RETICULOCYTES
ABS Retic: 59.2 10*3/uL (ref 19.0–186.0)
RBC.: 4.23 MIL/uL (ref 4.22–5.81)

## 2012-09-01 LAB — FOLATE: Folate: 9.2 ng/mL

## 2012-09-01 LAB — IRON AND TIBC: %SAT: 8 % — ABNORMAL LOW (ref 20–55)

## 2012-09-01 NOTE — Progress Notes (Signed)
Subjective:    Patient ID: Leonard [name], male    DOB: 1946-04-23, 66 y.o.   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 Also has history of tinea versicolor, GE reflux, bilateral carpal tunnel syndrome, peripheral neuropathy, sleep apnea, low testosterone.  History of kidney stone around 1996. History of low back pain. Left knee surgery February 1999 and again July 1999.  Pneumovax 2007,  Tdap vaccine June 2011, colonoscopy February 2011 by Dr. [name]. Has CPAP apparatus. Sees Dr. Huston Foley for cardiology issues. He had a previous cardiac catheterization a number of years ago with normal left ventricular function and an ejection fraction of 60%.  Recent lab work shows him to be anemic. We added iron studies and he is iron deficient. B12 and folate levels are normal. In July 2011 hemoglobin was 11.8 g with normal MCV. In June 2011 hemoglobin was 11.5 g with normal MCV. In June 2010 hemoglobin was 11.8 g with normal MCV. In April 2009 hemoglobin was 12.2 g with MCV of 85. He's been seen here since 1997 the past hemoglobin was 13.1 g in 1997. He has BPH followed by Dr. Brunilda Payor saw him recently for prostate exam.  Had eye exam at [facility] here in [facility] April 2013.  Has had regular colonoscopies throughout the years in 20 01/04/2006 and 2011. History of adenomatous colon polyp 2007.  Had upper endoscopy 01/12/2010 showing moderate gastritis. Had colonoscopy same day showing scattered diverticula the and internal hemorrhoids.  Had incision a thrombosed external hemorrhoid in 2010 by Dr. Corliss Skains.  Was sent to nutrition management in 2007 for diabetic and nutritional counseling. 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  He is pleased that he now has a church that is close to his home. He used to have to travel over one hour to get to his church and sometimes made several trips a week.    Review of Systems  Constitutional: Negative.   HENT: Negative.   Eyes: Negative.   Respiratory: Negative.        History of sleep apnea has CPAP  Cardiovascular:       History of nonobstructive coronary artery disease  Gastrointestinal:       History of thrombosed external hemorrhoid. History of diverticulosis. History of adenomatous colon polyp  Genitourinary:       Some hesitancy  Musculoskeletal: Negative.   Neurological:       History of peripheral neuropathy in lower extremities  Hematological:       Normocytic anemia  Psychiatric/Behavioral: Negative.        Objective:   Physical Exam  Vitals reviewed. Constitutional: He is oriented to person, place, and time. He appears well-developed and well-nourished. No distress.  HENT:  Head: Normocephalic and atraumatic.  Right Ear: External ear normal.  Left Ear: External ear normal.  Mouth/Throat: Oropharynx is clear and moist. No oropharyngeal exudate.  Eyes: Conjunctivae normal and EOM are normal. Pupils are equal, round, and reactive to light. Right eye exhibits no discharge. Left eye exhibits no discharge. No scleral icterus.  Neck: Neck supple. No JVD present. No thyromegaly present.  Cardiovascular: Normal rate, regular rhythm, normal heart sounds and intact distal pulses.  No murmur heard. Pulmonary/Chest: Effort normal and breath sounds normal. No respiratory distress. He has no wheezes. He has no rales. He exhibits no tenderness.  Abdominal: Soft. Bowel sounds are normal. He exhibits no distension and no mass. There is no tenderness. There is no rebound and no guarding.  Genitourinary:       Deferred to urologist  Musculoskeletal: Normal range of motion. He exhibits no edema.  Lymphadenopathy:    He has no cervical  adenopathy.  Neurological: He is alert and oriented to person, place, and time. He has normal reflexes. He displays normal reflexes. No cranial nerve deficit.  Skin: Skin is dry. No rash noted. He is not diaphoretic.  Psychiatric: He has a normal mood and affect. His behavior is normal. Judgment and thought content normal.          Assessment & Plan:  Iron deficiency anemia. Previously had issues with anemia a few years ago when he was undergoing a religious fast. Last colonoscopy 2011. He was given 3 Hemoccult cards to return next week. Likely will need to follow up with Dr. [name] once again.  Hypertension-stable on current regimen  GE reflux treated with PPI.  History of gastritis based on endoscopy 2011  History of gout-stable on allopurinol. Treat gouty flareups within the sun.  History of benign monoclonal gammopathy-SPEP and immunofixation drawn today  History bilateral carpal tunnel syndrome  History of peripheral neuropathy lower extremities  History of sleep apnea-treated with CPAP  History of low testosterone  History of BPH-treated with Flomax  Adult onset diabetes mellitus-diet controlled  History of tinea versicolor-treated with Nizoral shampoo  History of kidney stone mid 1990s

## 2012-09-01 NOTE — Patient Instructions (Addendum)
Order given for Zostavax to be given at pharmacy. Continue seeing Dr. Brunilda Payor every 6 months. Continue same meds. Try to lose weight. Do 3 hemoccult cards. Return in 6 weeks. We'll arrange for Dr. [name] to see you again for iron deficiency anemia

## 2012-09-03 DIAGNOSIS — Z87898 Personal history of other specified conditions: Secondary | ICD-10-CM | POA: Insufficient documentation

## 2012-09-03 DIAGNOSIS — Z86010 Personal history of colonic polyps: Secondary | ICD-10-CM | POA: Insufficient documentation

## 2012-09-03 DIAGNOSIS — R7989 Other specified abnormal findings of blood chemistry: Secondary | ICD-10-CM | POA: Insufficient documentation

## 2012-09-03 DIAGNOSIS — D509 Iron deficiency anemia, unspecified: Secondary | ICD-10-CM | POA: Insufficient documentation

## 2012-09-05 LAB — PROTEIN ELECTROPHORESIS, SERUM
Alpha-1-Globulin: 5.3 % — ABNORMAL HIGH (ref 2.9–4.9)
Alpha-2-Globulin: 12.4 % — ABNORMAL HIGH (ref 7.1–11.8)
Beta 2: 5.5 % (ref 3.2–6.5)
Gamma Globulin: 14.7 % (ref 11.1–18.8)

## 2012-09-06 ENCOUNTER — Encounter: Payer: Self-pay | Admitting: Gastroenterology

## 2012-09-06 NOTE — Progress Notes (Signed)
Immunofixation added. Patient states he will return stool cards today. Has appointment with Dr. [name] on 09/29/2012

## 2012-09-06 NOTE — Addendum Note (Signed)
Addended by: Judy Pimple on: 09/06/2012 10:16 AM   Modules accepted: Orders

## 2012-09-07 ENCOUNTER — Encounter: Payer: Self-pay | Admitting: Internal Medicine

## 2012-09-07 LAB — IMMUNOFIXATION ELECTROPHORESIS
IgA: 290 mg/dL (ref 68–379)
IgM, Serum: 145 mg/dL (ref 41–251)
Total Protein, Serum Electrophoresis: 7 g/dL (ref 6.0–8.3)

## 2012-09-14 ENCOUNTER — Telehealth: Payer: Self-pay | Admitting: Internal Medicine

## 2012-09-14 NOTE — Telephone Encounter (Signed)
S/W PT IN REF. TO  NP APPT. ON 10/02/12@9 :30  REFERRING DR. [name] H/O BENIGN MGUS MAILED NP PACKET

## 2012-09-14 NOTE — Telephone Encounter (Signed)
C/D 09/14/12 for apopt. 10/02/12

## 2012-09-25 ENCOUNTER — Encounter: Payer: Self-pay | Admitting: *Deleted

## 2012-09-29 ENCOUNTER — Telehealth: Payer: Self-pay | Admitting: *Deleted

## 2012-09-29 ENCOUNTER — Encounter: Payer: Self-pay | Admitting: Gastroenterology

## 2012-09-29 ENCOUNTER — Ambulatory Visit (INDEPENDENT_AMBULATORY_CARE_PROVIDER_SITE_OTHER): Payer: Federal, State, Local not specified - PPO | Admitting: Gastroenterology

## 2012-09-29 VITALS — BP 140/80 | HR 75 | Ht 68.25 in | Wt 287.0 lb

## 2012-09-29 DIAGNOSIS — K2960 Other gastritis without bleeding: Secondary | ICD-10-CM

## 2012-09-29 DIAGNOSIS — Z86010 Personal history of colonic polyps: Secondary | ICD-10-CM

## 2012-09-29 DIAGNOSIS — D509 Iron deficiency anemia, unspecified: Secondary | ICD-10-CM

## 2012-09-29 DIAGNOSIS — K319 Disease of stomach and duodenum, unspecified: Secondary | ICD-10-CM

## 2012-09-29 DIAGNOSIS — R195 Other fecal abnormalities: Secondary | ICD-10-CM

## 2012-09-29 MED ORDER — MOVIPREP 100 G PO SOLR: 1.0000 | Freq: Once | ORAL | Status: DC

## 2012-09-29 NOTE — Telephone Encounter (Signed)
Called patient and told patient to hold his oral diabetic medication Tradjenta day of procedure.  Patient verbalized understanding

## 2012-09-29 NOTE — Progress Notes (Signed)
This is a very pleasant 66 year old African American male with controlled hypertensive cardiovascular disease and BPH.  He has ongoing iron deficiency anemia and had guaiac positive stools.  Last endoscopy and colonoscopy were negative in our office is 2 years ago except for relative gastritis.  He currently denies any gastrointestinal symptoms, has regular bowel movements, has a stable anemia with a hemoglobin of approximately 11 with low R. saturation.  He denies a family history of colon cancer.  He also denies abuse of NSAIDs, alcohol, or cigarettes.  For cardiovascular standpoint he is stable and denies shortness of breath exertion or any cardiopulmonary symptoms.  He does occasionally use a CPAP machine.  He does take Nexium 40 mg a day for chronic GERD, also is on daily 81 mg of aspirin.  Current Medications, Allergies, Past Medical History, Past Surgical History, Family History and Social History were reviewed in [employer] record.  Pertinent Review of Systems Negative... history of multiple kidney stones followed by Dr.Nesi in urology, carpal tunnel syndrome, and peripheral neuropathy.  He sees Dr. Jolaine Click cardiology and has normal left ventricular function with an ejection fraction of 60%.   Physical Exam: Currently patient with blood pressure 140/80, pulse 75 and regular, and weight 287 pounds the BMI of 43.32.  Oxygen saturation 97%.  I cannot appreciate stigmata of chronic liver disease.  Chest is clear and he is in a regular rhythm without murmurs gallops or rubs.  He has obese abdomen but no definite organomegaly, masses or tenderness.  There is no peripheral edema, phlebitis, or swollen joints.  Mental status is normal.  Rectal exam is unremarkable with normal color stool which is markedly guaiac positive.    Assessment and Plan: Iron deficiency anemia secondary to GI blood loss, probably from chronic aspirin use and gut enteropathy.  We will repeat his  colonoscopy to be sure we have not missed a colon polyp or cancer, also repeat his endoscopy.  He has appointment to see oncology about his anemia, but this should wait till after his endoscopic procedures.  His endoscopy and colonoscopy are nonproductive, we'll proceed with pill camera enteroscopy of his gut.  He otherwise his continue his medications per his multiple physicians.  Chart review does show a colon polyp removed in 2007. No diagnosis found.

## 2012-09-29 NOTE — Patient Instructions (Addendum)
You have been scheduled for an endoscopy and colonoscopy with propofol. Please follow the written instructions given to you at your visit today. Please pick up your prep at the pharmacy within the next 1-3 days. If you use inhalers (even only as needed) or a CPAP machine, please bring them with you on the day of your procedure. 

## 2012-10-02 ENCOUNTER — Ambulatory Visit (HOSPITAL_BASED_OUTPATIENT_CLINIC_OR_DEPARTMENT_OTHER): Payer: Federal, State, Local not specified - PPO | Admitting: Internal Medicine

## 2012-10-02 ENCOUNTER — Ambulatory Visit (INDEPENDENT_AMBULATORY_CARE_PROVIDER_SITE_OTHER): Payer: Federal, State, Local not specified - PPO | Admitting: Internal Medicine

## 2012-10-02 ENCOUNTER — Other Ambulatory Visit (HOSPITAL_BASED_OUTPATIENT_CLINIC_OR_DEPARTMENT_OTHER): Payer: Federal, State, Local not specified - PPO | Admitting: Lab

## 2012-10-02 ENCOUNTER — Ambulatory Visit: Payer: Federal, State, Local not specified - PPO

## 2012-10-02 ENCOUNTER — Other Ambulatory Visit: Payer: Self-pay | Admitting: Internal Medicine

## 2012-10-02 ENCOUNTER — Other Ambulatory Visit: Payer: Self-pay | Admitting: Medical Oncology

## 2012-10-02 ENCOUNTER — Encounter: Payer: Self-pay | Admitting: Internal Medicine

## 2012-10-02 VITALS — BP 135/79 | HR 80 | Resp 16 | Ht 68.0 in | Wt 290.0 lb

## 2012-10-02 VITALS — BP 155/81 | HR 79 | Temp 97.1°F | Resp 18 | Ht 67.5 in | Wt 287.3 lb

## 2012-10-02 DIAGNOSIS — I2582 Chronic total occlusion of coronary artery: Secondary | ICD-10-CM

## 2012-10-02 DIAGNOSIS — D472 Monoclonal gammopathy: Secondary | ICD-10-CM

## 2012-10-02 DIAGNOSIS — D649 Anemia, unspecified: Secondary | ICD-10-CM

## 2012-10-02 DIAGNOSIS — I2510 Atherosclerotic heart disease of native coronary artery without angina pectoris: Secondary | ICD-10-CM

## 2012-10-02 LAB — COMPREHENSIVE METABOLIC PANEL (CC13)
ALT: 16 U/L (ref 0–55)
AST: 14 U/L (ref 5–34)
CO2: 28 mEq/L (ref 22–29)
Creatinine: 1.1 mg/dL (ref 0.7–1.3)
Sodium: 139 mEq/L (ref 136–145)
Total Bilirubin: 0.46 mg/dL (ref 0.20–1.20)
Total Protein: 6.9 g/dL (ref 6.4–8.3)

## 2012-10-02 LAB — CBC WITH DIFFERENTIAL/PLATELET
Basophils Absolute: 0 10*3/uL (ref 0.0–0.1)
EOS%: 1.7 % (ref 0.0–7.0)
Eosinophils Absolute: 0.2 10*3/uL (ref 0.0–0.5)
HCT: 33.4 % — ABNORMAL LOW (ref 38.4–49.9)
HGB: 10.8 g/dL — ABNORMAL LOW (ref 13.0–17.1)
MONO#: 0.6 10*3/uL (ref 0.1–0.9)
NEUT#: 5.4 10*3/uL (ref 1.5–6.5)
NEUT%: 59.6 % (ref 39.0–75.0)
RDW: 18.1 % — ABNORMAL HIGH (ref 11.0–14.6)
WBC: 9 10*3/uL (ref 4.0–10.3)
lymph#: 2.8 10*3/uL (ref 0.9–3.3)

## 2012-10-02 LAB — RETICULOCYTES
Retic %: 1.92 % — ABNORMAL HIGH (ref 0.80–1.80)
Retic Ct Abs: 79.68 10*3/uL (ref 34.80–93.90)

## 2012-10-02 LAB — LACTATE DEHYDROGENASE (CC13): LDH: 208 U/L (ref 125–245)

## 2012-10-02 MED ORDER — ATORVASTATIN CALCIUM 10 MG PO TABS: 10.0000 mg | ORAL_TABLET | Freq: Every day | ORAL | Status: DC

## 2012-10-02 NOTE — Progress Notes (Signed)
HPI Patient is a 66 year old iwht a history of HTN, minimal CAD Cath 2007, sleep apnea and HL.  I saw hiim in clinic in Sept 2012. He has a history of atypical CP Since seen he has done well.  NO CP  Breathing is OK   Joined the [facility].  Allergies  Allergen Reactions  . Dicloxacillin Rash  . Cayenne Pepper [facility]) Swelling    Current Outpatient Prescriptions  Medication Sig Dispense Refill  . allopurinol (ZYLOPRIM) 300 MG tablet TAKE 1 TABLET BY MOUTH DAILY TO PREVENT GOUT  30 tablet  11  . amLODipine-benazepril (LOTREL) 10-20 MG per capsule TAKE 1 CAPSULE BY MOUTH EVERY DAY  90 capsule  4  . aspirin 81 MG tablet Take 81 mg by mouth daily.        Marland Kitchen atorvastatin (LIPITOR) 10 MG tablet TAKE 1 TABLET EVERY DAY  90 tablet  0  . furosemide (LASIX) 40 MG tablet TAKE 1 TABLET BY MOUTH EVERY DAY  90 tablet  0  . IRON PO Take 1 tablet by mouth daily.      Marland Kitchen NEXIUM 40 MG capsule TAKE ONE CAPSULE BY MOUTH DAILY  90 capsule  4  . NITROSTAT 0.4 MG SL tablet as needed.       . Tamsulosin HCl (FLOMAX) 0.4 MG CAPS Take 0.4 mg by mouth daily as needed.       . Testosterone (ANDROGEL PUMP) 1.25 GM/ACT (1%) GEL Apply once daily as directed       . TRADJENTA 5 MG TABS tablet TAKE 1 TABLET BY MOUTH EVERY DAY  30 tablet  5  . MOVIPREP 100 G SOLR Take 1 kit (100 g total) by mouth once.  1 kit  0    Past Medical History  Diagnosis Date  . Type II or unspecified type diabetes mellitus without mention of complication, not stated as uncontrolled   . Gouty arthropathy   . Calculus of kidney   . Other and unspecified hyperlipidemia   . Unspecified essential hypertension   . OSA (obstructive sleep apnea)     USES CPAP MACHINE   . Iron deficiency anemia, unspecified   . Diverticulosis of colon (without mention of hemorrhage) 09/05/2006    Colonoscopy   . Hx of colonic polyps 09/05/2006    Colonoscopy(ADENOMATOUS POLYP)  . Hiatal hernia 09/05/2006    EGD  . 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 GERD (gastroesophageal reflux disease) 07/26/2001    EGD(Chronic)  . Hemorrhoids     Past Surgical History  Procedure Date  . Knee surgery   . Hemorrhoid surgery     Family History  Problem Relation Age of Onset  . Heart attack Father   . Diabetes Mother   . Diabetes Brother     History   Social History  . Marital Status: Married    Spouse Name: N/A    Number of Children: 3  . Years of Education: N/A   Occupational History  . Paramedic    Social History Main Topics  . Smoking status: Never Smoker   . Smokeless tobacco: Never Used  . Alcohol Use: No  . Drug Use: No  . Sexually Active: Not on file   Other Topics Concern  . Not on file   Social History Narrative  . No narrative on file    Review of Systems:  All systems reviewed.  They are negative to the above problem except as  previously stated.  Vital Signs: BP 135/79  Pulse 80  Resp 16  Ht 5\' 8"  (1.727 m)  Wt 290 lb (131.543 kg)  BMI 44.09 kg/m2  Physical Exam Patient is in NAD  Morbidly obese HEENT:  Normocephalic, atraumatic. EOMI, PERRLA.  Neck: JVP is normal.  No bruits.  Lungs: clear to auscultation. No rales no wheezes.  Heart: Regular rate and rhythm. Normal S1, S2. No S3.   No significant murmurs. PMI not displaced.  Abdomen:  Supple, nontender. Normal bowel sounds. No masses. No hepatomegaly.  Extremities:   Good distal pulses throughout. No lower extremity edema.  Musculoskeletal :moving all extremities.  Neuro:   alert and oriented x3.  CN II-XII grossly intact.  EKG:  SR 77 bpm  LVH.   Assessment and Plan:  1.  HTN  Good control  2.  CAD  Minimal by cath  Denies CP  3.  HL  Continue on statin  Will need to follow  4.  Obesity.  Morbid.  Encouraged him to watch portion sizes, increase activity.

## 2012-10-02 NOTE — Patient Instructions (Signed)
You have nonspecific monoclonal protein You have a history of iron deficiency anemia. Followup in 3 weeks to discuss the results

## 2012-10-02 NOTE — Progress Notes (Signed)
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  HPI Leonard [name] is a 66 y.o. male was past medical history significant for diabetes mellitus, hypertension, coronary artery disease, GERD, gout, history of sleep apnea, history of peripheral neuropathy, androgen deficiency and history of kidney stone. The patient was seen recently by his primary care physician Dr. [name] for evaluation of his anemia. Serum protein electrophoreses on 09/01/2012 showed no detectable M spike but the immunofixation showed monoclonal IgM kappa protein present. His quantitative immunoglobulin showed normal IgG 1400, IgA 290 and IgM 145. The patient had iron study performed on 09/01/2012 which showed low iron level of 35 with total iron binding capacity of 461 and iron saturation 8%. He was referred to Dr. [name] with [facility] gastroenterology. He is scheduled for colonoscopy in 2 days. The patient was started empirically on over the counter iron tablets for the last 2 weeks. He is tolerating it well with no significant adverse effects. He is feeling fine today with no specific complaints. He denied having any significant fatigue or weakness. He denied having any dizzy spells. The patient has no bleeding issues, bruises or ecchymosis. He has no chest pain, shortness breath, cough or hemoptysis. His family history significant for diabetes mellitus and heart disease. The patient is married and has one son. He works for the [employer]. He has no history of smoking, alcohol or drug abuse.  @SFHPI @  Past Medical History  Diagnosis Date  . Type II or unspecified type diabetes mellitus without mention of complication, not stated as uncontrolled   . Gouty arthropathy   . Calculus of kidney   . Other and unspecified hyperlipidemia   . Unspecified essential hypertension   . OSA  (obstructive sleep apnea)     USES CPAP MACHINE   . Iron deficiency anemia, unspecified   . Diverticulosis of colon (without mention of hemorrhage) 09/05/2006    Colonoscopy   . Hx of colonic polyps 09/05/2006    Colonoscopy(ADENOMATOUS POLYP)  . Hiatal hernia 09/05/2006    EGD  . 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 GERD (gastroesophageal reflux disease) 07/26/2001    EGD(Chronic)  . Hemorrhoids     Past Surgical History  Procedure Date  . Knee surgery   . Hemorrhoid surgery     Family History  Problem Relation Age of Onset  . Heart attack Father   . Diabetes Mother   . Diabetes Brother     Social History History  Substance Use Topics  . Smoking status: Never Smoker   . Smokeless tobacco: Never Used  . Alcohol Use: No    Allergies  Allergen Reactions  . Dicloxacillin Rash  . Cayenne Pepper [facility]) Swelling    Current Outpatient Prescriptions  Medication Sig Dispense Refill  . allopurinol (ZYLOPRIM) 300 MG tablet TAKE 1 TABLET BY MOUTH DAILY TO PREVENT GOUT  30 tablet  11  . amLODipine-benazepril (LOTREL) 10-20 MG per capsule TAKE 1 CAPSULE BY MOUTH EVERY DAY  90 capsule  4  . aspirin 81 MG tablet Take 81 mg by mouth daily.        Marland Kitchen atorvastatin (LIPITOR) 10 MG tablet TAKE 1 TABLET EVERY DAY  90 tablet  0  . furosemide (LASIX) 40 MG tablet TAKE 1 TABLET BY MOUTH EVERY DAY  90 tablet  0  .  IRON PO Take 1 tablet by mouth daily.      Marland Kitchen MOVIPREP 100 G SOLR Take 1 kit (100 g total) by mouth once.  1 kit  0  . NEXIUM 40 MG capsule TAKE ONE CAPSULE BY MOUTH DAILY  90 capsule  4  . Tamsulosin HCl (FLOMAX) 0.4 MG CAPS Take 0.4 mg by mouth daily as needed.       . TRADJENTA 5 MG TABS tablet TAKE 1 TABLET BY MOUTH EVERY DAY  30 tablet  5  . NITROSTAT 0.4 MG SL tablet as needed.       . Testosterone (ANDROGEL PUMP) 1.25 GM/ACT (1%) GEL Apply once daily as directed         Review of Systems  A comprehensive review of systems was  negative.  Physical Exam  AVW:UJWJX, healthy, no distress, well nourished and well developed SKIN: skin color, texture, turgor are normal HEAD: Normocephalic, No masses, lesions, tenderness or abnormalities EYES: normal, PERRLA EARS: External ears normal OROPHARYNX:no exudate and no erythema  NECK: supple, no adenopathy LYMPH:  no palpable lymphadenopathy, no hepatosplenomegaly LUNGS: clear to auscultation  HEART: regular rate & rhythm, no murmurs and no gallops ABDOMEN:abdomen soft, non-tender, obese, normal bowel sounds and no masses or organomegaly BACK: Back symmetric, no curvature. EXTREMITIES:no joint deformities, effusion, or inflammation, no edema, no skin discoloration, no clubbing  NEURO: alert & oriented x 3 with fluent speech, no focal motor/sensory deficits  PERFORMANCE STATUS: ECOG 0  LABORATORY DATA: Lab Results  Component Value Date   WBC 9.0 10/02/2012   HGB 10.8* 10/02/2012   HCT 33.4* 10/02/2012   MCV 83.2 10/02/2012   PLT 264 10/02/2012      Chemistry      Component Value Date/Time   NA 140 08/31/2012 1044   K 4.2 08/31/2012 1044   CL 104 08/31/2012 1044   CO2 24 08/31/2012 1044   BUN 22 08/31/2012 1044   CREATININE 1.30 08/31/2012 1044   CREATININE 1.3 08/25/2007 1458      Component Value Date/Time   CALCIUM 9.4 08/31/2012 1044   ALKPHOS 69 08/31/2012 1044   AST 12 08/31/2012 1044   ALT 11 08/31/2012 1044   BILITOT 0.5 08/31/2012 1044       RADIOGRAPHIC STUDIES: No results found.  ASSESSMENT: This is a very pleasant 66 years old [place] American male presented for evaluation of monoclonal gammopathy of undetermined significance as well as history of anemia most likely secondary to iron deficiency. The patient has nonspecific monoclonal IgM but his quantitative IgM is normal and he has no M spike on the serum protein electrophoreses.   PLAN: I have a lengthy discussion with the patient today about his condition. I ordered several studies  for evaluation of his disease including repeat CBC, comprehensive metabolic panel, LDH and myeloma panel. I also ordered anemia panel on this patient today including iron study, serum folate, serum B12 level as well as serum erythropoietin. These lab results are still pending.  I would see the patient back for followup visit in 3 weeks for evaluation and discussion of his lab results and further recommendations regarding treatment of his condition. I recommended for the patient to continue on the oral iron tablets for now. I gave the patient the time to ask questions and I answered them completely to his satisfaction. He was advised to call immediately if he has any concerning symptoms in the interval.  All questions were answered. The patient knows to call the clinic with  any problems, questions or concerns. We can certainly see the patient much sooner if necessary.  Thank you so much for allowing me to participate in the care of [place]. I will continue to follow up the patient with you and assist in his care.  I spent 30 minutes counseling the patient face to face. The total time spent in the appointment was 50 minutes.  Henlee Donovan K. 10/02/2012, 10:41 AM

## 2012-10-02 NOTE — Progress Notes (Signed)
Called lab to add tests per Dr Arbutus Ped

## 2012-10-04 ENCOUNTER — Ambulatory Visit (AMBULATORY_SURGERY_CENTER): Payer: Federal, State, Local not specified - PPO | Admitting: Gastroenterology

## 2012-10-04 ENCOUNTER — Encounter: Payer: Self-pay | Admitting: Gastroenterology

## 2012-10-04 ENCOUNTER — Telehealth: Payer: Self-pay | Admitting: Internal Medicine

## 2012-10-04 VITALS — BP 144/85 | HR 65 | Temp 98.0°F | Resp 13 | Ht 68.0 in | Wt 287.0 lb

## 2012-10-04 DIAGNOSIS — K5730 Diverticulosis of large intestine without perforation or abscess without bleeding: Secondary | ICD-10-CM

## 2012-10-04 DIAGNOSIS — K317 Polyp of stomach and duodenum: Secondary | ICD-10-CM

## 2012-10-04 DIAGNOSIS — R195 Other fecal abnormalities: Secondary | ICD-10-CM

## 2012-10-04 DIAGNOSIS — Z860100 Personal history of colon polyps, unspecified: Secondary | ICD-10-CM

## 2012-10-04 DIAGNOSIS — K219 Gastro-esophageal reflux disease without esophagitis: Secondary | ICD-10-CM

## 2012-10-04 DIAGNOSIS — K2960 Other gastritis without bleeding: Secondary | ICD-10-CM

## 2012-10-04 DIAGNOSIS — D131 Benign neoplasm of stomach: Secondary | ICD-10-CM

## 2012-10-04 DIAGNOSIS — D649 Anemia, unspecified: Secondary | ICD-10-CM

## 2012-10-04 DIAGNOSIS — D509 Iron deficiency anemia, unspecified: Secondary | ICD-10-CM

## 2012-10-04 LAB — SPEP & IFE WITH QIG
Alpha-1-Globulin: 5.1 % — ABNORMAL HIGH (ref 2.9–4.9)
Beta 2: 6 % (ref 3.2–6.5)
Gamma Globulin: 14.8 % (ref 11.1–18.8)
IgA: 243 mg/dL (ref 68–379)
IgG (Immunoglobin G), Serum: 1220 mg/dL (ref 650–1600)
IgM, Serum: 124 mg/dL (ref 41–251)

## 2012-10-04 LAB — BETA 2 MICROGLOBULIN, SERUM: Beta-2 Microglobulin: 2.15 mg/L — ABNORMAL HIGH (ref 1.01–1.73)

## 2012-10-04 LAB — KAPPA/LAMBDA LIGHT CHAINS: Kappa:Lambda Ratio: 1.13 (ref 0.26–1.65)

## 2012-10-04 LAB — GLUCOSE, CAPILLARY: Glucose-Capillary: 88 mg/dL (ref 70–99)

## 2012-10-04 MED ORDER — SODIUM CHLORIDE 0.9 % IV SOLN: 500.0000 mL | INTRAVENOUS | Status: DC

## 2012-10-04 NOTE — Progress Notes (Signed)
Patient did not experience any of the following events: a burn prior to discharge; a fall within the facility; wrong site/side/patient/procedure/implant event; or a hospital transfer or hospital admission upon discharge from the facility. (G8907) Patient did not have preoperative order for IV antibiotic SSI prophylaxis. (G8918)  

## 2012-10-04 NOTE — Telephone Encounter (Signed)
Lmonvm for pt re appt for 12/9 and mailed schedule

## 2012-10-04 NOTE — Op Note (Signed)
[facility] [address] [place], [address]   ENDOSCOPY PROCEDURE REPORT  PATIENT: Leonard [name], Leonard [name]  MR#: [id number] BIRTHDATE: 06-17-1946 , 66  yrs. old GENDER: Male ENDOSCOPIST:Blessin Kanno Hale Bogus, MD, [facility] Leonard [name] REFERRED BY: Sharlet Salina, M.D. PROCEDURE DATE:  10/04/2012 PROCEDURE:   EGD w/ biopsy and EGD w/ biopsy for H.pylori ASA CLASS:    Class III INDICATIONS: ++ stool card. MEDICATION: There was residual sedation effect present from prior procedure and propofol (Diprivan) 200mg  IV TOPICAL ANESTHETIC:  DESCRIPTION OF PROCEDURE:   After the risks and benefits of the procedure were explained, informed consent was obtained.  The [facility] GIF-H180 [id number]  endoscope was introduced through the mouth  and advanced to the second portion of the duodenum .  The instrument was slowly withdrawn as the mucosa was fully examined.    The upper, middle and distal third of the esophagus were carefully inspected and no abnormalities were noted.  The z-line was well seen at the GEJ.  The endoscope was pushed into the fundus which was normal including a retroflexed view.  The antrum, gastric body, first and second part of the duodenum were unremarkable. The scope was then withdrawn from the patient and the procedure completed. There was  a large somewhat vascular antral polyp with a thick short stalk. picture were obtained as were mucosal biopsies. there was also mild antral gastritis was smaller nodular polyposis. CLO biopsy was done for H. pylori exam. retroflex view of the fundus of the stomach otherwise was unremarkable.  COMPLICATIONS: There were no complications.   ENDOSCOPIC IMPRESSION: gastric polyposis with one prominent vascular antral polyp which is probably the source of his guaiac positive stools. Biopsies were obtained to ascertain if this is a adenomatous polyp or a hyperplastic lesion. if it is to be removed endoscopically, it would require the procedure to be  done in a hospital  setting witrh surgical backup.l  RECOMMENDATIONS: await CLO and mucosal biopsy results. also review his record in consult with Dr. [name] concerning cautious observation versus more aggressive therapy. if he is positive for H. pylori we'll of course treat him for this condition.    _______________________________ eSignedMardella Layman, MD, [facility] 10/04/2012 2:11 PM   standard discharge   PATIENT NAME:  Leonard [name], Leonard [name] MR#: [id number]

## 2012-10-04 NOTE — Op Note (Signed)
[facility] [address] [place], [address]   COLONOSCOPY PROCEDURE REPORT  PATIENT: Leonard [name], Leonard [name]  MR#: [id number] BIRTHDATE: 1946/06/20 , 66  yrs. old GENDER: Male ENDOSCOPIST: Mardella Layman, MD, [facility] REFERRED BY:  Sharlet Salina, M.D. PROCEDURE DATE:  10/04/2012 PROCEDURE:   Colonoscopy, screening ASA CLASS:   Class III INDICATIONS:Average risk patient for colon cancer and + ,stool. MEDICATIONS: propofol (Diprivan) 200mg  IV  DESCRIPTION OF PROCEDURE:   After the risks and benefits and of the procedure were explained, informed consent was obtained.  A digital rectal exam revealed no abnormalities of the rectum.    The [facility] [id number]  endoscope was introduced through the anus and advanced to the cecum, which was identified by both the appendix and ileocecal valve .  The quality of the prep was excellent, using MoviPrep .  The instrument was then slowly withdrawn as the colon was fully examined.     COLON FINDINGS: Mild diverticulosis was noted in the descending colon and sigmoid colon.   The colon was otherwise normal.  There was no diverticulosis, inflammation, polyps or cancers unless previously stated.     Retroflexed views revealed no abnormalities. The scope was then withdrawn from the patient and the procedure completed.  COMPLICATIONS: There were no complications. ENDOSCOPIC IMPRESSION: 1.   Mild diverticulosis was noted in the descending colon and sigmoid colon 2.   The colon was otherwise normal  RECOMMENDATIONS: 1.  High fiber diet 2.  Upper endoscopy will be scheduled   REPEAT EXAM:  cc:  _______________________________ eSignedMardella Layman, MD, [facility] 10/04/2012 1:57 PM

## 2012-10-04 NOTE — Patient Instructions (Addendum)
YOU HAD AN ENDOSCOPIC PROCEDURE TODAY AT THE [facility]: Refer to the procedure report that was given to you for any specific questions about what was found during the examination.  If the procedure report does not answer your questions, please call your gastroenterologist to clarify.  If you requested that your care partner not be given the details of your procedure findings, then the procedure report has been included in a sealed envelope for you to review at your convenience later.  YOU SHOULD EXPECT: Some feelings of bloating in the abdomen. Passage of more gas than usual.  Walking can help get rid of the air that was put into your GI tract during the procedure and reduce the bloating. If you had a lower endoscopy (such as a colonoscopy or flexible sigmoidoscopy) you may notice spotting of blood in your stool or on the toilet paper. If you underwent a bowel prep for your procedure, then you may not have a normal bowel movement for a few days.  DIET: Your first meal following the procedure should be a light meal and then it is ok to progress to your normal diet.  A half-sandwich or bowl of soup is an example of a good first meal.  Heavy or fried foods are harder to digest and may make you feel nauseous or bloated.  Likewise meals heavy in dairy and vegetables can cause extra gas to form and this can also increase the bloating.  Drink plenty of fluids but you should avoid alcoholic beverages for 24 hours.  ACTIVITY: Your care partner should take you home directly after the procedure.  You should plan to take it easy, moving slowly for the rest of the day.  You can resume normal activity the day after the procedure however you should NOT DRIVE or use heavy machinery for 24 hours (because of the sedation medicines used during the test).    SYMPTOMS TO REPORT IMMEDIATELY: A gastroenterologist can be reached at any hour.  During normal business hours, 8:30 AM to 5:00 PM Monday through Friday,  call [phone].  After hours and on weekends, please call the GI answering service at [phone] who will take a message and have the physician on call contact you.   Following lower endoscopy (colonoscopy or flexible sigmoidoscopy):  Excessive amounts of blood in the stool  Significant tenderness or worsening of abdominal pains  Swelling of the abdomen that is new, acute  Fever of 100F or higher  Following upper endoscopy (EGD)  Vomiting of blood or coffee ground material  New chest pain or pain under the shoulder blades  Painful or persistently difficult swallowing  New shortness of breath  Fever of 100F or higher  Black, tarry-looking stools  FOLLOW UP: If any biopsies were taken you will be contacted by phone or by letter within the next 1-3 weeks.  Call your gastroenterologist if you have not heard about the biopsies in 3 weeks.  Our staff will call the home number listed on your records the next business day following your procedure to check on you and address any questions or concerns that you may have at that time regarding the information given to you following your procedure. This is a courtesy call and so if there is no answer at the home number and we have not heard from you through the emergency physician on call, we will assume that you have returned to your regular daily activities without incident.  SIGNATURES/CONFIDENTIALITY: You and/or your care   partner have signed paperwork which will be entered into your electronic medical record.  These signatures attest to the fact that that the information above on your After Visit Summary has been reviewed and is understood.  Full responsibility of the confidentiality of this discharge information lies with you and/or your care-partner.  

## 2012-10-05 ENCOUNTER — Telehealth: Payer: Self-pay | Admitting: *Deleted

## 2012-10-05 LAB — HELICOBACTER PYLORI SCREEN-BIOPSY: UREASE: NEGATIVE

## 2012-10-05 LAB — IRON AND TIBC
%SAT: 17 % — ABNORMAL LOW (ref 20–55)
Iron: 71 ug/dL (ref 42–165)
TIBC: 412 ug/dL (ref 215–435)
UIBC: 341 ug/dL (ref 125–400)

## 2012-10-05 LAB — FOLATE: Folate: 10.1 ng/mL

## 2012-10-05 LAB — ERYTHROPOIETIN: Erythropoietin: 103 m[IU]/mL — ABNORMAL HIGH (ref 2.6–34.0)

## 2012-10-05 LAB — FERRITIN: Ferritin: 12 ng/mL — ABNORMAL LOW (ref 22–322)

## 2012-10-05 LAB — VITAMIN B12: Vitamin B-12: 452 pg/mL (ref 211–911)

## 2012-10-05 NOTE — Telephone Encounter (Signed)
  Follow up Call-  Call back number 10/04/2012  Post procedure Call Back phone  # [phone] cell  Permission to leave phone message Yes     Patient questions:  Do you have a fever, pain , or abdominal swelling? no Pain Score  0 *  Have you tolerated food without any problems? yes  Have you been able to return to your normal activities? yes  Do you have any questions about your discharge instructions: Diet   no Medications  no Follow up visit  no  Do you have questions or concerns about your Care? no  Actions: * If pain score is 4 or above: No action needed, pain <4.

## 2012-10-06 ENCOUNTER — Telehealth: Payer: Self-pay | Admitting: Internal Medicine

## 2012-10-06 NOTE — Telephone Encounter (Signed)
Pt called and confirmed appt ,also mailed to pt per his req     anne

## 2012-10-10 ENCOUNTER — Encounter: Payer: Self-pay | Admitting: Gastroenterology

## 2012-10-23 ENCOUNTER — Other Ambulatory Visit (HOSPITAL_BASED_OUTPATIENT_CLINIC_OR_DEPARTMENT_OTHER): Payer: Federal, State, Local not specified - PPO | Admitting: Lab

## 2012-10-23 ENCOUNTER — Ambulatory Visit (HOSPITAL_BASED_OUTPATIENT_CLINIC_OR_DEPARTMENT_OTHER): Payer: Federal, State, Local not specified - PPO | Admitting: Internal Medicine

## 2012-10-23 VITALS — BP 149/88 | HR 68 | Temp 97.3°F | Resp 18 | Ht 68.0 in | Wt 284.9 lb

## 2012-10-23 DIAGNOSIS — D472 Monoclonal gammopathy: Secondary | ICD-10-CM

## 2012-10-23 DIAGNOSIS — D509 Iron deficiency anemia, unspecified: Secondary | ICD-10-CM

## 2012-10-23 DIAGNOSIS — D649 Anemia, unspecified: Secondary | ICD-10-CM

## 2012-10-23 LAB — CBC WITH DIFFERENTIAL/PLATELET
BASO%: 0.3 % (ref 0.0–2.0)
LYMPH%: 33.1 % (ref 14.0–49.0)
MCHC: 32.1 g/dL (ref 32.0–36.0)
MONO#: 0.7 10*3/uL (ref 0.1–0.9)
NEUT#: 6 10*3/uL (ref 1.5–6.5)
Platelets: 258 10*3/uL (ref 140–400)
RBC: 4.2 10*6/uL (ref 4.20–5.82)
RDW: 19.9 % — ABNORMAL HIGH (ref 11.0–14.6)
WBC: 10.3 10*3/uL (ref 4.0–10.3)
lymph#: 3.4 10*3/uL — ABNORMAL HIGH (ref 0.9–3.3)

## 2012-10-23 NOTE — Patient Instructions (Signed)
Your labwork were unremarkable except for mildly elevated free kappa light chain and low ferritin level. Continue oral iron tablet. Followup in 6 months with repeat blood work

## 2012-10-23 NOTE — Progress Notes (Signed)
[facility] [phone]   [phone]  OFFICE PROGRESS NOTE  Margaree Mackintosh, MD [address] [place] [address]  DIAGNOSIS:  1) monoclonal gammopathy of undetermined significance. 2) iron deficiency anemia.  PRIOR THERAPY: None  CURRENT THERAPY: Oral ferrous sulfate 325 mg by mouth twice a day the  INTERVAL HISTORY: Leonard [name] 66 y.o. male returns to the clinic today for followup visit. The patient is feeling fine today with no specific complaints. He had several studies performed since his last visit including repeat myeloma panel as well as anemia panel. He is here today for evaluation and discussion of his lab results. The patient is feeling fine and denied having any significant weight loss or night sweats. He denied having any chest pain but continues to have shortness breath with exertion, no cough or hemoptysis.  MEDICAL HISTORY: Past Medical History  Diagnosis Date  . Type II or unspecified type diabetes mellitus without mention of complication, not stated as uncontrolled   . Gouty arthropathy   . Calculus of kidney   . Other and unspecified hyperlipidemia   . Unspecified essential hypertension   . OSA (obstructive sleep apnea)     USES CPAP MACHINE   . Iron deficiency anemia, unspecified   . Diverticulosis of colon (without mention of hemorrhage) 09/05/2006    Colonoscopy   . Hx of colonic polyps 09/05/2006    Colonoscopy(ADENOMATOUS POLYP)  . Hiatal hernia 09/05/2006    EGD  . 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 GERD (gastroesophageal reflux disease) 07/26/2001    EGD(Chronic)  . Hemorrhoids     ALLERGIES:  is allergic to dicloxacillin and cayenne pepper.  MEDICATIONS:  Current Outpatient Prescriptions  Medication Sig Dispense Refill  . allopurinol (ZYLOPRIM) 300 MG tablet TAKE 1 TABLET BY MOUTH DAILY TO PREVENT GOUT  30 tablet  11  . amLODipine-benazepril (LOTREL) 10-20  MG per capsule TAKE 1 CAPSULE BY MOUTH EVERY DAY  90 capsule  4  . aspirin 81 MG tablet Take 81 mg by mouth daily.        Marland Kitchen atorvastatin (LIPITOR) 10 MG tablet Take 1 tablet (10 mg total) by mouth daily.  90 tablet  0  . furosemide (LASIX) 40 MG tablet TAKE 1 TABLET BY MOUTH EVERY DAY  90 tablet  0  . IRON PO Take 1 tablet by mouth daily.      Marland Kitchen NEXIUM 40 MG capsule TAKE ONE CAPSULE BY MOUTH DAILY  90 capsule  4  . Tamsulosin HCl (FLOMAX) 0.4 MG CAPS Take 0.4 mg by mouth daily as needed.       . Testosterone (ANDROGEL PUMP) 1.25 GM/ACT (1%) GEL Apply once daily as directed       . TRADJENTA 5 MG TABS tablet TAKE 1 TABLET BY MOUTH EVERY DAY  30 tablet  5  . NITROSTAT 0.4 MG SL tablet as needed.         SURGICAL HISTORY:  Past Surgical History  Procedure Date  . Knee surgery   . Hemorrhoid surgery     REVIEW OF SYSTEMS:  A comprehensive review of systems was negative.   PHYSICAL EXAMINATION: General appearance: alert, cooperative and no distress Head: Normocephalic, without obvious abnormality, atraumatic Lymph nodes: Cervical, supraclavicular, and axillary nodes normal. Resp: clear to auscultation bilaterally Cardio: regular rate and rhythm, S1, S2 normal, no murmur, click, rub or gallop GI: soft, non-tender; bowel sounds normal; no masses,  no organomegaly Extremities: extremities normal, atraumatic, no cyanosis or edema  ECOG PERFORMANCE STATUS: 1 - Symptomatic but completely ambulatory  Blood pressure 149/88, pulse 68, temperature 97.3 F (36.3 C), temperature source Oral, resp. rate 18, height 5\' 8"  (1.727 m), weight 284 lb 14.4 oz (129.23 kg).  LABORATORY DATA: Lab Results  Component Value Date   WBC 10.3 10/23/2012   HGB 11.5* 10/23/2012   HCT 35.8* 10/23/2012   MCV 85.3 10/23/2012   PLT 258 10/23/2012      Chemistry      Component Value Date/Time   NA 139 10/02/2012 0953   NA 140 08/31/2012 1044   K 4.1 10/02/2012 0953   K 4.2 08/31/2012 1044   CL 107 10/02/2012  0953   CL 104 08/31/2012 1044   CO2 28 10/02/2012 0953   CO2 24 08/31/2012 1044   BUN 14.0 10/02/2012 0953   BUN 22 08/31/2012 1044   CREATININE 1.1 10/02/2012 0953   CREATININE 1.30 08/31/2012 1044   CREATININE 1.3 08/25/2007 1458      Component Value Date/Time   CALCIUM 9.3 10/02/2012 0953   CALCIUM 9.4 08/31/2012 1044   ALKPHOS 74 10/02/2012 0953   ALKPHOS 69 08/31/2012 1044   AST 14 10/02/2012 0953   AST 12 08/31/2012 1044   ALT 16 10/02/2012 0953   ALT 11 08/31/2012 1044   BILITOT 0.46 10/02/2012 0953   BILITOT 0.5 08/31/2012 1044       RADIOGRAPHIC STUDIES: No results found.  ASSESSMENT: This is a very pleasant 66 years old African American male with monoclonal gammopathy of undetermined significance as well as history of iron deficiency anemia. His myeloma and anemia panel were unremarkable except for slighty elevated free kappa light chain and low ferritin level.   PLAN: I discussed the lab result with the patient today. I recommended for him to continue on the oral iron tablet twice a day. I would see him back for followup visit in 6 months with repeat myeloma panel as well as iron study. He was advised to call immediately if he has any concerning symptoms in the interval.  All questions were answered. The patient knows to call the clinic with any problems, questions or concerns. We can certainly see the patient much sooner if necessary.

## 2012-10-24 ENCOUNTER — Telehealth: Payer: Self-pay | Admitting: *Deleted

## 2012-10-24 NOTE — Telephone Encounter (Signed)
Gave patient appointment for lab only appointment 04-17-2013 9:00am 04-23-2013 md only appointment 9:00am  Patient confirmed over the phone the new date and time

## 2012-10-27 ENCOUNTER — Telehealth: Payer: Self-pay | Admitting: *Deleted

## 2012-10-27 DIAGNOSIS — D509 Iron deficiency anemia, unspecified: Secondary | ICD-10-CM

## 2012-10-27 NOTE — Telephone Encounter (Signed)
Per path letter after EGD, he needs repeat CBC in 6 weeks followed by an ofc visit. Scheduled with pt for 12/08/12 at 1100am.

## 2012-10-31 ENCOUNTER — Other Ambulatory Visit: Payer: Self-pay | Admitting: Internal Medicine

## 2012-11-15 DIAGNOSIS — K317 Polyp of stomach and duodenum: Secondary | ICD-10-CM

## 2012-11-15 HISTORY — DX: Polyp of stomach and duodenum: K31.7

## 2012-11-25 ENCOUNTER — Other Ambulatory Visit: Payer: Self-pay | Admitting: Internal Medicine

## 2012-12-08 ENCOUNTER — Ambulatory Visit (INDEPENDENT_AMBULATORY_CARE_PROVIDER_SITE_OTHER): Payer: Federal, State, Local not specified - PPO | Admitting: Gastroenterology

## 2012-12-08 ENCOUNTER — Other Ambulatory Visit (INDEPENDENT_AMBULATORY_CARE_PROVIDER_SITE_OTHER): Payer: Federal, State, Local not specified - PPO

## 2012-12-08 ENCOUNTER — Encounter: Payer: Self-pay | Admitting: Gastroenterology

## 2012-12-08 VITALS — BP 124/58 | HR 80 | Ht 67.25 in | Wt 287.1 lb

## 2012-12-08 DIAGNOSIS — D509 Iron deficiency anemia, unspecified: Secondary | ICD-10-CM

## 2012-12-08 DIAGNOSIS — K317 Polyp of stomach and duodenum: Secondary | ICD-10-CM

## 2012-12-08 DIAGNOSIS — D131 Benign neoplasm of stomach: Secondary | ICD-10-CM

## 2012-12-08 LAB — CBC WITH DIFFERENTIAL/PLATELET
Basophils Absolute: 0 10*3/uL (ref 0.0–0.1)
Hemoglobin: 11.7 g/dL — ABNORMAL LOW (ref 13.0–17.0)
Lymphocytes Relative: 32.4 % (ref 12.0–46.0)
Monocytes Relative: 6.1 % (ref 3.0–12.0)
Neutro Abs: 5.9 10*3/uL (ref 1.4–7.7)
Neutrophils Relative %: 59.9 % (ref 43.0–77.0)
RBC: 4.28 Mil/uL (ref 4.22–5.81)
RDW: 18.3 % — ABNORMAL HIGH (ref 11.5–14.6)

## 2012-12-08 NOTE — Patient Instructions (Addendum)
Please continue Nexium. Samples given today.  Please follow up in one year.

## 2012-12-08 NOTE — Progress Notes (Signed)
This is a very nice 67 year old African American male who has had chronic and deficiency anemia for the last 15 years.  Recent repeat endoscopy and colonoscopy was unremarkable suffer a rather large hyperplastic gastric polyp.  Biopsy showed no evidence of adenoma or GIST tumor.  Evaluation for H. pylori was negative.  The patient is on daily aspirin and has been started on Nexium 40 mg a day.  He denies GI symptomatology.  He is on oral iron therapy, and his hemoglobin has been stable.  He is followed by Dr.[name]  And Dr. Shirline Frees hematology.  Patient denies use of other NSAIDs, melena or hematochezia.  He also denies any general medical symptoms.  He currently is being evaluated for possible clonal gammopathy.  Current Medications, Allergies, Past Medical History, Past Surgical History, Family History and Social History were reviewed in [employer] record.  ROS: All systems were reviewed and are negative unless otherwise stated in the HPI.          Physical Exam: Healthy patient in no distress.  Blood pressure 124/58, pulse 80 and regular, and weight 287 with a BMI of 44.64.  His abdomen is somewhat obese but I cannot appreciate definite organomegaly, masses or tenderness.  Bowel sounds are normal.  Rectal exam was deferred.  Mental status is normal.    Assessment and Plan: Chronic low-grade iron loss from large hyperplastic gastric polyp which has not caused acute GI bleeding.  His chronic guaiac positive stools has been present for over 15 years, and his clinical course has been very stable.  Removal of this polyp would be extremely risky and complicated, and I do not feel that is in order at this time but have recommended that he continue daily PPI therapy and daily oral iron therapy.  I reviewed his case with other GI doctors and ibuprofen they agreed as stated.  Repeat labs today drawn as recommended by hematology.  Multiple samples of Nexium 40 mg given to patient as  requested. No diagnosis found.

## 2012-12-22 ENCOUNTER — Other Ambulatory Visit: Payer: Self-pay | Admitting: Internal Medicine

## 2012-12-25 ENCOUNTER — Other Ambulatory Visit: Payer: Self-pay

## 2012-12-25 MED ORDER — FUROSEMIDE 40 MG PO TABS: 40.0000 mg | ORAL_TABLET | Freq: Every day | ORAL | Status: DC

## 2013-03-09 ENCOUNTER — Ambulatory Visit (INDEPENDENT_AMBULATORY_CARE_PROVIDER_SITE_OTHER): Payer: Federal, State, Local not specified - PPO | Admitting: Pulmonary Disease

## 2013-03-09 ENCOUNTER — Encounter: Payer: Self-pay | Admitting: Pulmonary Disease

## 2013-03-09 ENCOUNTER — Ambulatory Visit: Payer: Federal, State, Local not specified - PPO | Admitting: Internal Medicine

## 2013-03-09 VITALS — BP 108/58 | HR 80 | Temp 98.9°F | Ht 69.25 in | Wt 297.2 lb

## 2013-03-09 DIAGNOSIS — G4733 Obstructive sleep apnea (adult) (pediatric): Secondary | ICD-10-CM

## 2013-03-09 NOTE — Progress Notes (Signed)
  Subjective:    Patient ID: Leonard [name], male    DOB: [birth year], 67 y.o.   MRN: [id number]  HPI The patient comes in today for followup of his obstructive sleep apnea.  He has been wearing CPAP at least 3 times a week, and states he doesn't wear it every night because he feels so much better on those other days.  I have explained to him that he may feel even better if he wears it more regularly.  He is having no issues with his CPAP mask or pressure, and feels that he sleeps well with the device.  His daytime alertness is acceptable.  Of note, his weight is up about 6 pounds since last visit.   Review of Systems  Constitutional: Negative for fever and unexpected weight change.  HENT: Negative for ear pain, nosebleeds, congestion, sore throat, rhinorrhea, sneezing, trouble swallowing, dental problem, postnasal drip and sinus pressure.   Eyes: Negative for redness and itching.  Respiratory: Negative for cough, chest tightness, shortness of breath and wheezing.   Cardiovascular: Negative for palpitations and leg swelling.  Gastrointestinal: Negative for nausea and vomiting.  Genitourinary: Negative for dysuria.  Musculoskeletal: Negative for joint swelling.  Skin: Negative for rash.  Neurological: Negative for headaches.  Hematological: Does not bruise/bleed easily.  Psychiatric/Behavioral: Negative for dysphoric mood. The patient is not nervous/anxious.        Objective:   Physical Exam Obese male in no acute distress Nose without purulence or discharge noted No skin breakdown or pressure necrosis from the CPAP mask Neck without lymphadenopathy or thyromegaly Lower extremities with mild edema, no cyanosis Alert and oriented, moves all 4 extremities.       Assessment & Plan:

## 2013-03-09 NOTE — Patient Instructions (Addendum)
Try to stay on cpap everynight if you can. Keep up with mask changes and supplies. Work on weight loss followup with me in one year.

## 2013-03-09 NOTE — Assessment & Plan Note (Signed)
The patient is wearing CPAP without issue, but has not been wearing it every night.  I have encouraged him to try and wear the device each night, and to keep up with his mask changes and supplies.  I have also encouraged him to work aggressively on weight loss, and to follow up with me in one year.

## 2013-03-16 ENCOUNTER — Ambulatory Visit (INDEPENDENT_AMBULATORY_CARE_PROVIDER_SITE_OTHER): Payer: Federal, State, Local not specified - PPO | Admitting: Internal Medicine

## 2013-03-16 ENCOUNTER — Encounter: Payer: Self-pay | Admitting: Internal Medicine

## 2013-03-16 VITALS — BP 130/84 | HR 74 | Temp 97.1°F | Wt 287.0 lb

## 2013-03-16 DIAGNOSIS — E785 Hyperlipidemia, unspecified: Secondary | ICD-10-CM

## 2013-03-16 DIAGNOSIS — E119 Type 2 diabetes mellitus without complications: Secondary | ICD-10-CM

## 2013-03-16 DIAGNOSIS — Z79899 Other long term (current) drug therapy: Secondary | ICD-10-CM

## 2013-03-16 DIAGNOSIS — E8881 Metabolic syndrome: Secondary | ICD-10-CM

## 2013-03-16 DIAGNOSIS — I10 Essential (primary) hypertension: Secondary | ICD-10-CM

## 2013-03-16 NOTE — Patient Instructions (Addendum)
Continue same meds and return in 6 months 

## 2013-03-17 ENCOUNTER — Encounter: Payer: Self-pay | Admitting: Internal Medicine

## 2013-03-17 DIAGNOSIS — E8881 Metabolic syndrome: Secondary | ICD-10-CM | POA: Insufficient documentation

## 2013-03-17 LAB — HEPATIC FUNCTION PANEL
ALT: 12 U/L (ref 0–53)
AST: 14 U/L (ref 0–37)
Albumin: 3.9 g/dL (ref 3.5–5.2)
Total Bilirubin: 0.5 mg/dL (ref 0.3–1.2)
Total Protein: 6.9 g/dL (ref 6.0–8.3)

## 2013-03-17 LAB — LIPID PANEL
HDL: 62 mg/dL (ref >39)
Total CHOL/HDL Ratio: 2.6 Ratio
Triglycerides: 71 mg/dL (ref <150)

## 2013-03-17 LAB — HEMOGLOBIN A1C: Hgb A1c MFr Bld: 6.3 % — ABNORMAL HIGH (ref <5.7)

## 2013-03-17 NOTE — Progress Notes (Signed)
  Subjective:    Patient ID: Leonard [name], male    DOB: 10-03-1946, 67 y.o.   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 History of metabolic syndrome, type 2 diabetes mellitus controlled with Tradjenta, hyperlipidemia, coronary artery disease, history of gout, hypertension, history of anemia and benign monoclonal gammopathy followed by Dr. Shirline Frees. 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 History of obesity. Has not been able to lose weight over the past 6 months. Overall he's feeling well. Says he's lost inches but not weight. Close feel looser. Other than work, he doesn't get much exercise. He and his wife have been trying to eat healthy. History of tinea versicolor. Coronary artery disease followed by [facility].    Review of Systems     Objective:   Physical Exam  Skin is warm and dry. Chest is clear to auscultation without rales or wheezing. Neck is supple without carotid bruits. Cardiac exam regular rate and rhythm normal S1 and S2. Extremities without edema. Diabetic foot exam: Feet are warm without calluses, fungal infection, ulcers. Posterior tibial pulses absent. Faint dorsalis pedis pulses palpable. Knees: Bilateral crepitus.        Assessment & Plan:  Type 2 diabetes mellitus controlled with oral medication-A1c 6.3%  Morbid obesity-encouraged diet and exercise  History of gout-stable-stable  History of hypertension-stable on treatment  History of coronary artery disease-stable followed by cardiologist  Hyperlipidemia-fasting lipid panel drawn today and results are normal  History of anemia treated with iron  History of benign monoclonal gammopathy followed by Dr. Shirline Frees  History of GE reflux-continue PPI  Osteoarthritis of knees-stable  Metabolic syndrome  Plan: Patient is return in 6 months for physical examination. Encouraged diet and exercise and weight loss. Feel he needs more  exercise than he is currently getting.

## 2013-03-29 ENCOUNTER — Other Ambulatory Visit: Payer: Self-pay | Admitting: Internal Medicine

## 2013-04-17 ENCOUNTER — Other Ambulatory Visit: Payer: Federal, State, Local not specified - PPO

## 2013-04-17 ENCOUNTER — Other Ambulatory Visit (HOSPITAL_BASED_OUTPATIENT_CLINIC_OR_DEPARTMENT_OTHER): Payer: Federal, State, Local not specified - PPO | Admitting: Lab

## 2013-04-17 DIAGNOSIS — D649 Anemia, unspecified: Secondary | ICD-10-CM

## 2013-04-17 DIAGNOSIS — D472 Monoclonal gammopathy: Secondary | ICD-10-CM

## 2013-04-17 LAB — COMPREHENSIVE METABOLIC PANEL (CC13)
AST: 15 U/L (ref 5–34)
Albumin: 3.5 g/dL (ref 3.5–5.0)
Alkaline Phosphatase: 77 U/L (ref 40–150)
Potassium: 3.9 mEq/L (ref 3.5–5.1)
Sodium: 140 mEq/L (ref 136–145)
Total Bilirubin: 0.49 mg/dL (ref 0.20–1.20)
Total Protein: 7.4 g/dL (ref 6.4–8.3)

## 2013-04-17 LAB — CBC WITH DIFFERENTIAL/PLATELET
BASO%: 0.4 % (ref 0.0–2.0)
EOS%: 2.3 % (ref 0.0–7.0)
LYMPH%: 37.6 % (ref 14.0–49.0)
MCH: 27.3 pg (ref 27.2–33.4)
MCHC: 31.8 g/dL — ABNORMAL LOW (ref 32.0–36.0)
MCV: 85.9 fL (ref 79.3–98.0)
MONO%: 6.3 % (ref 0.0–14.0)
NEUT#: 4.8 10*3/uL (ref 1.5–6.5)
Platelets: 260 10*3/uL (ref 140–400)
RBC: 4.26 10*6/uL (ref 4.20–5.82)
RDW: 17 % — ABNORMAL HIGH (ref 11.0–14.6)

## 2013-04-19 LAB — IRON AND TIBC
%SAT: 8 % — ABNORMAL LOW (ref 20–55)
Iron: 31 ug/dL — ABNORMAL LOW (ref 42–165)
TIBC: 389 ug/dL (ref 215–435)
UIBC: 358 ug/dL (ref 125–400)

## 2013-04-19 LAB — SPEP & IFE WITH QIG
Albumin ELP: 55.3 % — ABNORMAL LOW (ref 55.8–66.1)
Alpha-1-Globulin: 5.6 % — ABNORMAL HIGH (ref 2.9–4.9)
Alpha-2-Globulin: 11.6 % (ref 7.1–11.8)
Beta Globulin: 8.4 % — ABNORMAL HIGH (ref 4.7–7.2)
M-Spike, %: 0.24 g/dL
Total Protein, Serum Electrophoresis: 6.8 g/dL (ref 6.0–8.3)

## 2013-04-19 LAB — KAPPA/LAMBDA LIGHT CHAINS: Kappa free light chain: 2.87 mg/dL — ABNORMAL HIGH (ref 0.33–1.94)

## 2013-04-19 LAB — FERRITIN: Ferritin: 17 ng/mL — ABNORMAL LOW (ref 22–322)

## 2013-04-19 LAB — BETA 2 MICROGLOBULIN, SERUM: Beta-2 Microglobulin: 2.53 mg/L — ABNORMAL HIGH (ref 1.01–1.73)

## 2013-04-23 ENCOUNTER — Ambulatory Visit (HOSPITAL_BASED_OUTPATIENT_CLINIC_OR_DEPARTMENT_OTHER): Payer: Federal, State, Local not specified - PPO | Admitting: Internal Medicine

## 2013-04-23 ENCOUNTER — Telehealth: Payer: Self-pay | Admitting: Internal Medicine

## 2013-04-23 ENCOUNTER — Encounter: Payer: Self-pay | Admitting: Internal Medicine

## 2013-04-23 VITALS — BP 161/80 | HR 71 | Temp 97.4°F | Resp 18 | Ht 69.25 in | Wt 295.7 lb

## 2013-04-23 DIAGNOSIS — D509 Iron deficiency anemia, unspecified: Secondary | ICD-10-CM

## 2013-04-23 DIAGNOSIS — Z862 Personal history of diseases of the blood and blood-forming organs and certain disorders involving the immune mechanism: Secondary | ICD-10-CM

## 2013-04-23 NOTE — Progress Notes (Signed)
[facility] Telephone:([phone]   Fax:([phone]  OFFICE PROGRESS NOTE  Margaree Mackintosh, MD [address] [place] [address]  DIAGNOSIS:  1) monoclonal gammopathy of undetermined significance.  2) iron deficiency anemia.   PRIOR THERAPY: None.  CURRENT THERAPY: Oral ferrous sulfate 325 mg by mouth twice a day.  INTERVAL HISTORY: Leonard [name] 67 y.o. male returns to the clinic today for followup visit. The patient is feeling fine today with no specific complaints except for mild fatigue. He does not take his ferrous sulfate as previously recommended. The patient denied having any bleeding issues, specifically no rectal bleeding. He denied having any significant chest pain, shortness breath, cough or hemoptysis. He has repeat CBC, iron study, ferritin and myeloma panel performed recently and he is here for evaluation and discussion of his lab results.  MEDICAL HISTORY: Past Medical History  Diagnosis Date  . Type II or unspecified type diabetes mellitus without mention of complication, not stated as uncontrolled   . Gouty arthropathy   . Calculus of kidney   . Other and unspecified hyperlipidemia   . Unspecified essential hypertension   . OSA (obstructive sleep apnea)     USES CPAP MACHINE   . Iron deficiency anemia, unspecified   . Diverticulosis of colon (without mention of hemorrhage) 09/05/2006    Colonoscopy   . Hx of colonic polyps 09/05/2006    Colonoscopy(ADENOMATOUS POLYP)  . Hiatal hernia 09/05/2006    EGD  . 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 GERD (gastroesophageal reflux disease) 07/26/2001    EGD(Chronic)  . Hemorrhoids     ALLERGIES:  is allergic to dicloxacillin and cayenne pepper.  MEDICATIONS:  Current Outpatient Prescriptions  Medication Sig Dispense Refill  . allopurinol (ZYLOPRIM) 300 MG tablet TAKE 1 TABLET BY MOUTH DAILY TO PREVENT GOUT  30 tablet  11  .  amLODipine-benazepril (LOTREL) 10-20 MG per capsule TAKE 1 CAPSULE BY MOUTH EVERY DAY  90 capsule  4  . aspirin 81 MG tablet Take 81 mg by mouth daily.        Marland Kitchen atorvastatin (LIPITOR) 10 MG tablet Take 1 tablet (10 mg total) by mouth daily.  90 tablet  0  . furosemide (LASIX) 40 MG tablet Take 1 tablet (40 mg total) by mouth daily.  90 tablet  1  . IRON PO Take 1 tablet by mouth daily.      Marland Kitchen NEXIUM 40 MG capsule TAKE ONE CAPSULE BY MOUTH DAILY  90 capsule  3  . Tamsulosin HCl (FLOMAX) 0.4 MG CAPS Take 0.4 mg by mouth daily as needed.       . Testosterone (ANDROGEL PUMP) 1.25 GM/ACT (1%) GEL Apply once daily as directed       . TRADJENTA 5 MG TABS tablet TAKE 1 TABLET BY MOUTH EVERY DAY  30 tablet  5  . NITROSTAT 0.4 MG SL tablet PLACE 1 TABLET UNDER TONGUE AS NEEDED FOR CHEST PAIN, MAY REPEAT EVERY 5 MINUTES FOR 3 TIMES  25 tablet  2   No current facility-administered medications for this visit.    SURGICAL HISTORY:  Past Surgical History  Procedure Laterality Date  . Knee surgery    . Hemorrhoid surgery      REVIEW OF SYSTEMS:  A comprehensive review of systems was negative except for: Constitutional: positive for fatigue   PHYSICAL EXAMINATION: General appearance: alert, cooperative, fatigued and no distress Head: Normocephalic, without obvious abnormality,  atraumatic Neck: no adenopathy Lymph nodes: Cervical, supraclavicular, and axillary nodes normal. Resp: clear to auscultation bilaterally Cardio: regular rate and rhythm, S1, S2 normal, no murmur, click, rub or gallop GI: soft, non-tender; bowel sounds normal; no masses,  no organomegaly Extremities: extremities normal, atraumatic, no cyanosis or edema  ECOG PERFORMANCE STATUS: 1 - Symptomatic but completely ambulatory  Blood pressure 161/80, pulse 71, temperature 97.4 F (36.3 C), temperature source Oral, resp. rate 18, height 5' 9.25" (1.759 m), weight 295 lb 11.2 oz (134.129 kg).  LABORATORY DATA: Lab Results    Component Value Date   WBC 9.1 04/17/2013   HGB 11.6* 04/17/2013   HCT 36.6* 04/17/2013   MCV 85.9 04/17/2013   PLT 260 04/17/2013      Chemistry      Component Value Date/Time   NA 140 04/17/2013 0857   NA 140 08/31/2012 1044   K 3.9 04/17/2013 0857   K 4.2 08/31/2012 1044   CL 104 04/17/2013 0857   CL 104 08/31/2012 1044   CO2 26 04/17/2013 0857   CO2 24 08/31/2012 1044   BUN 19.5 04/17/2013 0857   BUN 22 08/31/2012 1044   CREATININE 1.2 04/17/2013 0857   CREATININE 1.30 08/31/2012 1044   CREATININE 1.3 08/25/2007 1458      Component Value Date/Time   CALCIUM 9.6 04/17/2013 0857   CALCIUM 9.4 08/31/2012 1044   ALKPHOS 77 04/17/2013 0857   ALKPHOS 66 03/16/2013 1235   AST 15 04/17/2013 0857   AST 14 03/16/2013 1235   ALT 15 04/17/2013 0857   ALT 12 03/16/2013 1235   BILITOT 0.49 04/17/2013 0857   BILITOT 0.5 03/16/2013 1235     Other lab results: Myeloma panel unremarkable. Serum iron 31, total iron-binding capacity 389, iron saturation 8%, ferritin 17.  RADIOGRAPHIC STUDIES: No results found.  ASSESSMENT: This is a very pleasant 67 years old [place] American male with history of fine deficiency anemia as well as monoclonal normocephalic and significance. The patient has persistent iron deficiency but he is not compliant with his oral iron Medication   PLAN: I have a lengthy discussion with the patient today about his condition. I gave him the option of continuing oral iron but taking at 2-3 times daily basis consideration of intravenous iron infusion with Feraheme. The patient declines intravenous iron infusion at this point and he promised to be compliant with his oral iron medication. I would see him back for followup visit in 3 months with repeat iron study. He was advised to call immediately if he has any concerning symptoms in the interval.  All questions were answered. The patient knows to call the clinic with any problems, questions or concerns. We can certainly see the patient much sooner if  necessary.

## 2013-04-23 NOTE — Patient Instructions (Signed)
Increase the dose of the iron tablet to 2-3 tablets every day. Followup visit in 3 months with repeat iron study.

## 2013-04-24 ENCOUNTER — Ambulatory Visit: Payer: Federal, State, Local not specified - PPO | Admitting: Internal Medicine

## 2013-04-29 ENCOUNTER — Other Ambulatory Visit: Payer: Self-pay | Admitting: Internal Medicine

## 2013-07-24 ENCOUNTER — Other Ambulatory Visit (HOSPITAL_BASED_OUTPATIENT_CLINIC_OR_DEPARTMENT_OTHER): Payer: Federal, State, Local not specified - PPO

## 2013-07-24 DIAGNOSIS — D472 Monoclonal gammopathy: Secondary | ICD-10-CM

## 2013-07-24 DIAGNOSIS — D649 Anemia, unspecified: Secondary | ICD-10-CM

## 2013-07-24 DIAGNOSIS — D509 Iron deficiency anemia, unspecified: Secondary | ICD-10-CM

## 2013-07-24 LAB — CBC WITH DIFFERENTIAL/PLATELET
Basophils Absolute: 0 10*3/uL (ref 0.0–0.1)
Eosinophils Absolute: 0.2 10*3/uL (ref 0.0–0.5)
HGB: 11.8 g/dL — ABNORMAL LOW (ref 13.0–17.1)
MCV: 87.8 fL (ref 79.3–98.0)
MONO%: 5.3 % (ref 0.0–14.0)
NEUT#: 5.3 10*3/uL (ref 1.5–6.5)
RBC: 4.12 10*6/uL — ABNORMAL LOW (ref 4.20–5.82)
RDW: 17.2 % — ABNORMAL HIGH (ref 11.0–14.6)
WBC: 9 10*3/uL (ref 4.0–10.3)
lymph#: 2.9 10*3/uL (ref 0.9–3.3)

## 2013-07-24 LAB — COMPREHENSIVE METABOLIC PANEL (CC13)
AST: 13 U/L (ref 5–34)
Albumin: 3.3 g/dL — ABNORMAL LOW (ref 3.5–5.0)
Alkaline Phosphatase: 69 U/L (ref 40–150)
BUN: 19.8 mg/dL (ref 7.0–26.0)
Calcium: 9.3 mg/dL (ref 8.4–10.4)
Chloride: 106 mEq/L (ref 98–109)
Glucose: 150 mg/dl — ABNORMAL HIGH (ref 70–140)
Potassium: 3.7 mEq/L (ref 3.5–5.1)
Sodium: 140 mEq/L (ref 136–145)
Total Protein: 7.1 g/dL (ref 6.4–8.3)

## 2013-07-24 LAB — IRON AND TIBC CHCC
Iron: 34 ug/dL — ABNORMAL LOW (ref 42–163)
TIBC: 364 ug/dL (ref 202–409)

## 2013-07-25 LAB — IGG, IGA, IGM: IgA: 188 mg/dL (ref 68–379)

## 2013-07-25 LAB — KAPPA/LAMBDA LIGHT CHAINS
Kappa free light chain: 2.45 mg/dL — ABNORMAL HIGH (ref 0.33–1.94)
Kappa:Lambda Ratio: 1.07 (ref 0.26–1.65)
Lambda Free Lght Chn: 2.3 mg/dL (ref 0.57–2.63)

## 2013-07-25 LAB — BETA 2 MICROGLOBULIN, SERUM: Beta-2 Microglobulin: 1.86 mg/L — ABNORMAL HIGH (ref 1.01–1.73)

## 2013-07-30 ENCOUNTER — Ambulatory Visit: Payer: Federal, State, Local not specified - PPO | Admitting: Internal Medicine

## 2013-07-31 ENCOUNTER — Telehealth: Payer: Self-pay | Admitting: Internal Medicine

## 2013-07-31 NOTE — Telephone Encounter (Signed)
, °

## 2013-08-03 ENCOUNTER — Telehealth: Payer: Self-pay | Admitting: *Deleted

## 2013-08-03 ENCOUNTER — Telehealth: Payer: Self-pay | Admitting: Internal Medicine

## 2013-08-03 ENCOUNTER — Ambulatory Visit (HOSPITAL_BASED_OUTPATIENT_CLINIC_OR_DEPARTMENT_OTHER): Payer: Federal, State, Local not specified - PPO | Admitting: Internal Medicine

## 2013-08-03 ENCOUNTER — Encounter: Payer: Self-pay | Admitting: Internal Medicine

## 2013-08-03 DIAGNOSIS — R5381 Other malaise: Secondary | ICD-10-CM

## 2013-08-03 DIAGNOSIS — D509 Iron deficiency anemia, unspecified: Secondary | ICD-10-CM

## 2013-08-03 DIAGNOSIS — D472 Monoclonal gammopathy: Secondary | ICD-10-CM

## 2013-08-03 DIAGNOSIS — K5900 Constipation, unspecified: Secondary | ICD-10-CM

## 2013-08-03 NOTE — Progress Notes (Signed)
[facility] Telephone:([phone]   Fax:([phone]  OFFICE PROGRESS NOTE  Margaree Mackintosh, MD [address] [place] [address]  DIAGNOSIS:  1) monoclonal gammopathy of undetermined significance.  2) iron deficiency anemia.   PRIOR THERAPY: None.   CURRENT THERAPY: Oral ferrous sulfate 325 mg by mouth twice a day.  INTERVAL HISTORY: Leonard [name] 67 y.o. male returns to the clinic today for follow up visit. The patient continues to complain of increasing fatigue and weakness. He denied having any bleeding issues he specifically no rectal bleeding. He is tolerating his treatment with over-the-counter oral iron tablets fairly well except for mild upset stomach. The patient denied having any significant nausea or vomiting but has mild constipation. He denied having any significant chest pain, shortness breath, cough or hemoptysis. He has a recent lab work including CBC, iron study and ferritin as well as myeloma panel and he is here for evaluation and discussion of his lab results.  MEDICAL HISTORY: Past Medical History  Diagnosis Date  . Type II or unspecified type diabetes mellitus without mention of complication, not stated as uncontrolled   . Gouty arthropathy   . Calculus of kidney   . Other and unspecified hyperlipidemia   . Unspecified essential hypertension   . OSA (obstructive sleep apnea)     USES CPAP MACHINE   . Iron deficiency anemia, unspecified   . Diverticulosis of colon (without mention of hemorrhage) 09/05/2006    Colonoscopy   . Hx of colonic polyps 09/05/2006    Colonoscopy(ADENOMATOUS POLYP)  . Hiatal hernia 09/05/2006    EGD  . 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 GERD (gastroesophageal reflux disease) 07/26/2001    EGD(Chronic)  . Hemorrhoids     ALLERGIES:  is allergic to dicloxacillin and cayenne pepper.  MEDICATIONS:  Current Outpatient Prescriptions  Medication Sig Dispense  Refill  . allopurinol (ZYLOPRIM) 300 MG tablet TAKE 1 TABLET BY MOUTH DAILY TO PREVENT GOUT  30 tablet  11  . amLODipine-benazepril (LOTREL) 10-20 MG per capsule TAKE 1 CAPSULE BY MOUTH EVERY DAY  90 capsule  4  . aspirin 81 MG tablet Take 81 mg by mouth daily.        Marland Kitchen atorvastatin (LIPITOR) 10 MG tablet Take 1 tablet (10 mg total) by mouth daily.  90 tablet  0  . furosemide (LASIX) 40 MG tablet Take 1 tablet (40 mg total) by mouth daily.  90 tablet  1  . IRON PO Take 1 tablet by mouth daily.      Marland Kitchen NEXIUM 40 MG capsule TAKE ONE CAPSULE BY MOUTH DAILY  90 capsule  3  . NITROSTAT 0.4 MG SL tablet PLACE 1 TABLET UNDER TONGUE AS NEEDED FOR CHEST PAIN, MAY REPEAT EVERY 5 MINUTES FOR 3 TIMES  25 tablet  2  . Tamsulosin HCl (FLOMAX) 0.4 MG CAPS Take 0.4 mg by mouth daily as needed.       . TRADJENTA 5 MG TABS tablet TAKE 1 TABLET BY MOUTH EVERY DAY  30 tablet  5  . Testosterone (ANDROGEL PUMP) 1.25 GM/ACT (1%) GEL Apply once daily as directed        No current facility-administered medications for this visit.    SURGICAL HISTORY:  Past Surgical History  Procedure Laterality Date  . Knee surgery    . Hemorrhoid surgery      REVIEW OF SYSTEMS:  Constitutional: positive for fatigue Eyes: negative Ears,  nose, mouth, throat, and face: negative Respiratory: negative Cardiovascular: negative Gastrointestinal: negative Genitourinary:negative Integument/breast: negative Hematologic/lymphatic: negative Musculoskeletal:negative Neurological: negative Behavioral/Psych: negative Endocrine: negative Allergic/Immunologic: negative   PHYSICAL EXAMINATION: General appearance: alert, cooperative, fatigued and no distress Head: Normocephalic, without obvious abnormality, atraumatic Neck: no adenopathy and thyroid not enlarged, symmetric, no tenderness/mass/nodules Lymph nodes: Cervical, supraclavicular, and axillary nodes normal. Resp: clear to auscultation bilaterally Back: symmetric, no  curvature. ROM normal. No CVA tenderness. Cardio: regular rate and rhythm, S1, S2 normal, no murmur, click, rub or gallop GI: soft, non-tender; bowel sounds normal; no masses,  no organomegaly Extremities: extremities normal, atraumatic, no cyanosis or edema Neurologic: Alert and oriented X 3, normal strength and tone. Normal symmetric reflexes. Normal coordination and gait  ECOG PERFORMANCE STATUS: 1 - Symptomatic but completely ambulatory  There were no vitals taken for this visit.  LABORATORY DATA: Lab Results  Component Value Date   WBC 9.0 07/24/2013   HGB 11.8* 07/24/2013   HCT 36.2* 07/24/2013   MCV 87.8 07/24/2013   PLT 252 07/24/2013      Chemistry      Component Value Date/Time   NA 140 07/24/2013 0852   NA 140 08/31/2012 1044   K 3.7 07/24/2013 0852   K 4.2 08/31/2012 1044   CL 104 04/17/2013 0857   CL 104 08/31/2012 1044   CO2 25 07/24/2013 0852   CO2 24 08/31/2012 1044   BUN 19.8 07/24/2013 0852   BUN 22 08/31/2012 1044   CREATININE 1.2 07/24/2013 0852   CREATININE 1.30 08/31/2012 1044   CREATININE 1.3 08/25/2007 1458      Component Value Date/Time   CALCIUM 9.3 07/24/2013 0852   CALCIUM 9.4 08/31/2012 1044   ALKPHOS 69 07/24/2013 0852   ALKPHOS 66 03/16/2013 1235   AST 13 07/24/2013 0852   AST 14 03/16/2013 1235   ALT 16 07/24/2013 0852   ALT 12 03/16/2013 1235   BILITOT 0.35 07/24/2013 0852   BILITOT 0.5 03/16/2013 1235     Other lab studies: ferritin 21, serum iron 34, total iron-binding capacity 364 and iron saturation 9%. Free kappa light chain 2.45, free lambda light chain 2.30 with a kappa/lambda ratio of 1.07. Beta-2 microglobulin 1.86.  RADIOGRAPHIC STUDIES: No results found.  ASSESSMENT AND PLAN:  1) iron deficiency anemia most likely secondary to GI blood loss. I reviewed the patient's previous upper endoscopy which showed gastric polyp that was not removed because the procedure was done on an outpatient basis. I think the patient will need repeat upper endoscopy with removal  of the gastric polyp in the hospital with surgical support.  I referred him back to [facility] gastroenterology for reevaluation. I would also start the patient on Feraheme infusion 510 mg IV x2 doses. He would have repeat CBC and iron study in 3 months.  2) Monoclonal gammopathy of undetermined significance: no significant evidence for disease progression. The patient will continue on observation for now. He would come back for follow up visit in 3 months with repeat myeloma panel. The patient was advised to call immediately if he has any concerning symptoms in the interval.   The patient voices understanding of current disease status and treatment options and is in agreement with the current care plan.  All questions were answered. The patient knows to call the clinic with any problems, questions or concerns. We can certainly see the patient much sooner if necessary.  I spent 20 minutes counseling the patient face to face. The total time spent in  the appointment was 30 minutes.

## 2013-08-03 NOTE — Telephone Encounter (Signed)
Per staff message and POF I have scheduled appts.  JMW  

## 2013-08-03 NOTE — Telephone Encounter (Signed)
Gave pt appt for lab, Md and Iron for 9/26 friday and in October

## 2013-08-04 NOTE — Patient Instructions (Signed)
Iron infusion in the next 2 weeks. Referral to gastroenterology. Follow up visit in 3 months with repeat iron study.

## 2013-08-07 ENCOUNTER — Other Ambulatory Visit: Payer: Self-pay | Admitting: Internal Medicine

## 2013-08-10 ENCOUNTER — Ambulatory Visit (HOSPITAL_BASED_OUTPATIENT_CLINIC_OR_DEPARTMENT_OTHER): Payer: Federal, State, Local not specified - PPO

## 2013-08-10 ENCOUNTER — Telehealth: Payer: Self-pay | Admitting: Internal Medicine

## 2013-08-10 VITALS — BP 160/78 | HR 63 | Temp 97.2°F | Resp 20

## 2013-08-10 DIAGNOSIS — D509 Iron deficiency anemia, unspecified: Secondary | ICD-10-CM

## 2013-08-10 MED ORDER — SODIUM CHLORIDE 0.9 % IV SOLN
Freq: Once | INTRAVENOUS | Status: AC
  Administered 2013-08-10: 10:00:00 via INTRAVENOUS

## 2013-08-10 MED ORDER — FERUMOXYTOL INJECTION 510 MG/17 ML
510.0000 mg | Freq: Once | INTRAVENOUS | Status: AC
  Administered 2013-08-10: 510 mg via INTRAVENOUS
  Filled 2013-08-10: qty 17

## 2013-08-10 NOTE — Progress Notes (Signed)
Patient received first dose of Feraheme today in infusion room without complaints. No acute distress noted during infusion or during 30 minute post monitoring period. Patient remained AAO, BP increase noted upon taking vital signs pre and post feraheme. Patient takes BP meds and states he will follow up with his PCP to make sure he is taking appropriate BP med dose. Patient discharged in stable condition, no acute distress. Clayborn Heron, RN

## 2013-08-10 NOTE — Telephone Encounter (Signed)
Pt came by moved appt to early am on 10/2

## 2013-08-10 NOTE — Patient Instructions (Addendum)
Ferumoxytol injection What is this medicine? FERUMOXYTOL is an iron complex. Iron is used to make healthy red blood cells, which carry oxygen and nutrients throughout the body. This medicine is used to treat iron deficiency anemia in people with chronic kidney disease. This medicine may be used for other purposes; ask your health care provider or pharmacist if you have questions. What should I tell my health care provider before I take this medicine? They need to know if you have any of these conditions: -anemia not caused by low iron levels -high levels of iron in the blood -magnetic resonance imaging (MRI) test scheduled -an unusual or allergic reaction to iron, other medicines, foods, dyes, or preservatives -pregnant or trying to get pregnant -breast-feeding How should I use this medicine? This medicine is for infusion into a vein. It is given by a health care professional in a hospital or clinic setting. Talk to your pediatrician regarding the use of this medicine in children. Special care may be needed. Overdosage: If you think you've taken too much of this medicine contact a poison control center or emergency room at once. Overdosage: If you think you have taken too much of this medicine contact a poison control center or emergency room at once. NOTE: This medicine is only for you. Do not share this medicine with others. What if I miss a dose? It is important not to miss your dose. Call your doctor or health care professional if you are unable to keep an appointment. What may interact with this medicine? This medicine may interact with the following medications: -other iron products This list may not describe all possible interactions. Give your health care provider a list of all the medicines, herbs, non-prescription drugs, or dietary supplements you use. Also tell them if you smoke, drink alcohol, or use illegal drugs. Some items may interact with your medicine. What should I watch  for while using this medicine? Visit your doctor or healthcare professional regularly. Tell your doctor or healthcare professional if your symptoms do not start to get better or if they get worse. You may need blood work done while you are taking this medicine. You may need to follow a special diet. Talk to your doctor. Foods that contain iron include: whole grains/cereals, dried fruits, beans, or peas, leafy green vegetables, and organ meats (liver, kidney). What side effects may I notice from receiving this medicine? Side effects that you should report to your doctor or health care professional as soon as possible: -allergic reactions like skin rash, itching or hives, swelling of the face, lips, or tongue -breathing problems -changes in blood pressure -feeling faint or lightheaded, falls -fever or chills -flushing, sweating, or hot feelings -swelling of the ankles or feet Side effects that usually do not require medical attention (Report these to your doctor or health care professional if they continue or are bothersome.): -diarrhea -headache -nausea, vomiting -stomach pain This list may not describe all possible side effects. Call your doctor for medical advice about side effects. You may report side effects to FDA at 1-800-FDA-1088. Where should I keep my medicine? This drug is given in a hospital or clinic and will not be stored at home. NOTE: This sheet is a summary. It may not cover all possible information. If you have questions about this medicine, talk to your doctor, pharmacist, or health care provider.  2013, Elsevier/Gold Standard. (07/24/2008 9:48:25 PM)  

## 2013-08-16 ENCOUNTER — Other Ambulatory Visit: Payer: Self-pay | Admitting: Internal Medicine

## 2013-08-16 ENCOUNTER — Ambulatory Visit (HOSPITAL_BASED_OUTPATIENT_CLINIC_OR_DEPARTMENT_OTHER): Payer: Federal, State, Local not specified - PPO

## 2013-08-16 VITALS — BP 132/73 | HR 88 | Temp 97.9°F | Resp 20

## 2013-08-16 DIAGNOSIS — D509 Iron deficiency anemia, unspecified: Secondary | ICD-10-CM

## 2013-08-16 MED ORDER — FERUMOXYTOL INJECTION 510 MG/17 ML
510.0000 mg | Freq: Once | INTRAVENOUS | Status: AC
  Administered 2013-08-16: 510 mg via INTRAVENOUS
  Filled 2013-08-16: qty 17

## 2013-08-16 MED ORDER — SODIUM CHLORIDE 0.9 % IV SOLN
Freq: Once | INTRAVENOUS | Status: AC
  Administered 2013-08-16: 08:00:00 via INTRAVENOUS

## 2013-08-16 NOTE — Progress Notes (Signed)
Pt tolerated feraheme without any problems

## 2013-08-16 NOTE — Patient Instructions (Signed)
Ferumoxytol injection What is this medicine? FERUMOXYTOL is an iron complex. Iron is used to make healthy red blood cells, which carry oxygen and nutrients throughout the body. This medicine is used to treat iron deficiency anemia in people with chronic kidney disease. This medicine may be used for other purposes; ask your health care provider or pharmacist if you have questions. What should I tell my health care provider before I take this medicine? They need to know if you have any of these conditions: -anemia not caused by low iron levels -high levels of iron in the blood -magnetic resonance imaging (MRI) test scheduled -an unusual or allergic reaction to iron, other medicines, foods, dyes, or preservatives -pregnant or trying to get pregnant -breast-feeding How should I use this medicine? This medicine is for infusion into a vein. It is given by a health care professional in a hospital or clinic setting. Talk to your pediatrician regarding the use of this medicine in children. Special care may be needed. Overdosage: If you think you've taken too much of this medicine contact a poison control center or emergency room at once. Overdosage: If you think you have taken too much of this medicine contact a poison control center or emergency room at once. NOTE: This medicine is only for you. Do not share this medicine with others. What if I miss a dose? It is important not to miss your dose. Call your doctor or health care professional if you are unable to keep an appointment. What may interact with this medicine? This medicine may interact with the following medications: -other iron products This list may not describe all possible interactions. Give your health care provider a list of all the medicines, herbs, non-prescription drugs, or dietary supplements you use. Also tell them if you smoke, drink alcohol, or use illegal drugs. Some items may interact with your medicine. What should I watch  for while using this medicine? Visit your doctor or healthcare professional regularly. Tell your doctor or healthcare professional if your symptoms do not start to get better or if they get worse. You may need blood work done while you are taking this medicine. You may need to follow a special diet. Talk to your doctor. Foods that contain iron include: whole grains/cereals, dried fruits, beans, or peas, leafy green vegetables, and organ meats (liver, kidney). What side effects may I notice from receiving this medicine? Side effects that you should report to your doctor or health care professional as soon as possible: -allergic reactions like skin rash, itching or hives, swelling of the face, lips, or tongue -breathing problems -changes in blood pressure -feeling faint or lightheaded, falls -fever or chills -flushing, sweating, or hot feelings -swelling of the ankles or feet Side effects that usually do not require medical attention (Report these to your doctor or health care professional if they continue or are bothersome.): -diarrhea -headache -nausea, vomiting -stomach pain This list may not describe all possible side effects. Call your doctor for medical advice about side effects. You may report side effects to FDA at 1-800-FDA-1088. Where should I keep my medicine? This drug is given in a hospital or clinic and will not be stored at home. NOTE: This sheet is a summary. It may not cover all possible information. If you have questions about this medicine, talk to your doctor, pharmacist, or health care provider.  2013, Elsevier/Gold Standard. (07/24/2008 9:48:25 PM)  

## 2013-08-30 ENCOUNTER — Ambulatory Visit (INDEPENDENT_AMBULATORY_CARE_PROVIDER_SITE_OTHER): Payer: Federal, State, Local not specified - PPO | Admitting: Gastroenterology

## 2013-08-30 ENCOUNTER — Other Ambulatory Visit: Payer: Self-pay | Admitting: Internal Medicine

## 2013-08-30 ENCOUNTER — Encounter: Payer: Self-pay | Admitting: Gastroenterology

## 2013-08-30 VITALS — BP 128/70 | HR 81 | Ht 67.25 in | Wt 293.1 lb

## 2013-08-30 DIAGNOSIS — K317 Polyp of stomach and duodenum: Secondary | ICD-10-CM

## 2013-08-30 DIAGNOSIS — K921 Melena: Secondary | ICD-10-CM

## 2013-08-30 DIAGNOSIS — D509 Iron deficiency anemia, unspecified: Secondary | ICD-10-CM

## 2013-08-30 DIAGNOSIS — D131 Benign neoplasm of stomach: Secondary | ICD-10-CM

## 2013-08-30 NOTE — Progress Notes (Signed)
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  His had chronic GERD and chronic gastritis for many years and has had iron deficiency anemia for many years.  Colonoscopy was unremarkable in November of last year, and endoscopy showed a large bleeding several centimeter hyperplastic polyp that was biopsied, and was not an  Adenoma,.  He is been evaluated by hematology and is receiving iron by mouth and iron infusion but continues to have positive stools,anf serum ferritin of 11. CBC is stable. He is chronically on Nexium 40 mg a day and various antihypertensive medications.  Denies other GI symptomatology.He is on daily ASA for hypertensive cardiovascular and coronary artery disease.  [name] in hematology has referred him back for removal of his gastric polyp.  I feel this is best done in a tertiary Medical Center in because of  the high risk nature of this procedure.   Current Medications, Allergies, Past Medical History, Past Surgical History, Family History and Social History were reviewed in [employer] record.  ROS: All systems were reviewed and are negative unless otherwise stated in the HPI.   Allergies  Allergen Reactions  . Dicloxacillin Rash  . Cayenne Pepper [Cayenne] Swelling   Outpatient Prescriptions Prior to Visit  Medication Sig Dispense Refill  . allopurinol (ZYLOPRIM) 300 MG tablet TAKE 1 TABLET BY MOUTH DAILY TO PREVENT GOUT  30 tablet  11  . amLODipine-benazepril (LOTREL) 10-20 MG per capsule TAKE 1 CAPSULE BY MOUTH EVERY DAY  90 capsule  4  . aspirin 81 MG tablet Take 81 mg by mouth daily.        Marland Kitchen atorvastatin (LIPITOR) 10 MG tablet Take 1 tablet (10 mg total) by mouth daily.  90 tablet  0  . furosemide (LASIX) 40 MG tablet Take 1 tablet (40 mg total) by mouth daily.  90 tablet  1  . IRON PO Take 1 tablet by mouth daily.      Marland Kitchen NEXIUM 40 MG capsule TAKE  ONE CAPSULE BY MOUTH DAILY  90 capsule  3  . NITROSTAT 0.4 MG SL tablet PLACE 1 TABLET UNDER TONGUE AS NEEDED FOR CHEST PAIN, MAY REPEAT EVERY 5 MINUTES FOR 3 TIMES  25 tablet  2  . Tamsulosin HCl (FLOMAX) 0.4 MG CAPS Take 0.4 mg by mouth daily as needed.       . TRADJENTA 5 MG TABS tablet TAKE 1 TABLET BY MOUTH EVERY DAY  30 tablet  5  . Testosterone (ANDROGEL PUMP) 1.25 GM/ACT (1%) GEL Apply once daily as directed       . UNABLE TO FIND        No facility-administered medications prior to visit.   Past Medical History  Diagnosis Date  . Type II or unspecified type diabetes mellitus without mention of complication, not stated as uncontrolled   . Gouty arthropathy   . Calculus of kidney   . Other and unspecified hyperlipidemia   . Unspecified essential hypertension   . OSA (obstructive sleep apnea)     USES CPAP MACHINE   . Iron deficiency anemia, unspecified   . Diverticulosis of colon (without mention of hemorrhage) 09/05/2006    Colonoscopy   . Hx of colonic polyps 09/05/2006    Colonoscopy(ADENOMATOUS POLYP)  . Hiatal hernia 09/05/2006    EGD  . 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 GERD (gastroesophageal reflux disease)  07/26/2001    EGD(Chronic)  . Hemorrhoids    Past Surgical History  Procedure Laterality Date  . Knee surgery    . Hemorrhoid surgery     History   Social History  . Marital Status: Married    Spouse Name: N/A    Number of Children: 3  . Years of Education: N/A   Occupational History  . Paramedic    Social History Main Topics  . Smoking status: Never Smoker   . Smokeless tobacco: Never Used  . Alcohol Use: No  . Drug Use: No  . Sexual Activity: None   Other Topics Concern  . None   Social History Narrative  . None   Family History  Problem Relation Age of Onset  . Heart attack Father   . Diabetes Mother   . Diabetes Brother            Assessment and plan: Her workup has shown no source of GI  bleeding other than large vascular gastric polyp.  I think at this point he is a good candidate for endoscopic mucosa resection, and have referred him to Dr. Corliss Parish at [facility] division of gastroenterology.  Hopefully they can remove his vascular bleeding polyp without need for surgical intervention.  Hemoglobin is stable at 11.8.  There is been no evidence of a coagulopathy, and multiple tests for H. pylori have been negative.  Please send a copy of my endoscopy and colonoscopy reports and previous office records to Dr. Edyth Gunnels.  Apparently cardiology has not felt that he can discontinue his salicylate therapy,and this is probably playing a role in his chronic GI blood loss.  Again, hopefully endoscopic polypectomy will help take care of this related problem with need for long term ASA Rx.

## 2013-08-30 NOTE — Patient Instructions (Signed)
Dr. Dorrene German office will contact you with your appointment(may take a couple of weeks)  Their number is [phone]

## 2013-09-17 ENCOUNTER — Telehealth: Payer: Self-pay | Admitting: Gastroenterology

## 2013-09-17 NOTE — Telephone Encounter (Signed)
Leonard [name] gave note to me, all information was faxed on date of referral call.  I called Leonard [name] who handled the referral on 08-30-2013.  I called [phone] Leonard [name]) Leonard [name] assistance to check on the referral.  Per Leonard [name] they have tried to reach the patient numerous times in regard to scheduling appointment and no response. Leonard [name] the phone number that was left for me by the patient to reach patient there.  I advised Leonard [name] that I am calling patient and advising him that she will be calling and to be prepared to answer phone.  I called patient back and informed him that per Leonard [name] she is going to call him today in the next ten-fifteen minutes at number he left for me to call, please stand by phone.  Patient verbalized understanding and will call me tomorrow if he does not here from [place] today.  __________________________________________________________________________________________________________________________________________________________________

## 2013-09-19 ENCOUNTER — Telehealth: Payer: Self-pay | Admitting: Gastroenterology

## 2013-09-19 NOTE — Telephone Encounter (Signed)
Per Tresa Endo Smith's call the other day, informed pt to call [phone]([facility]) Dr. Dorrene German assistance to check on the referral. Pt stated understanding.

## 2013-09-19 NOTE — Telephone Encounter (Signed)
lmom for pt to call back

## 2013-09-21 ENCOUNTER — Encounter: Payer: Federal, State, Local not specified - PPO | Admitting: Internal Medicine

## 2013-09-26 ENCOUNTER — Other Ambulatory Visit: Payer: Self-pay | Admitting: Internal Medicine

## 2013-10-08 ENCOUNTER — Encounter: Payer: Self-pay | Admitting: Internal Medicine

## 2013-10-08 ENCOUNTER — Ambulatory Visit (INDEPENDENT_AMBULATORY_CARE_PROVIDER_SITE_OTHER): Payer: Federal, State, Local not specified - PPO | Admitting: Internal Medicine

## 2013-10-08 VITALS — BP 144/84 | HR 74 | Ht 69.25 in | Wt 293.0 lb

## 2013-10-08 DIAGNOSIS — I2510 Atherosclerotic heart disease of native coronary artery without angina pectoris: Secondary | ICD-10-CM

## 2013-10-08 NOTE — Progress Notes (Signed)
HPI Patient is a 67 year old iwht a history of HTN, minimal CAD Cath 2007, sleep apnea and HL.  I saw hiim in clinic in Nov 2013. He has a history of atypical CP Since seen he has done well.  NO CP  Breathing is OK     Allergies  Allergen Reactions  . Dicloxacillin Rash  . Cayenne Pepper [Cayenne] Swelling    Current Outpatient Prescriptions  Medication Sig Dispense Refill  . allopurinol (ZYLOPRIM) 300 MG tablet TAKE 1 TABLET BY MOUTH DAILY TO PREVENT GOUT  30 tablet  11  . amLODipine-benazepril (LOTREL) 10-20 MG per capsule TAKE 1 CAPSULE BY MOUTH EVERY DAY  90 capsule  4  . aspirin 81 MG tablet Take 81 mg by mouth daily.        Marland Kitchen atorvastatin (LIPITOR) 10 MG tablet TAKE 1 TABLET BY MOUTH DAILY  90 tablet  0  . furosemide (LASIX) 40 MG tablet TAKE 1 TABLET (40 MG TOTAL) BY MOUTH DAILY.  90 tablet  3  . NEXIUM 40 MG capsule TAKE ONE CAPSULE BY MOUTH DAILY  90 capsule  3  . NITROSTAT 0.4 MG SL tablet PLACE 1 TABLET UNDER TONGUE AS NEEDED FOR CHEST PAIN, MAY REPEAT EVERY 5 MINUTES FOR 3 TIMES  25 tablet  2  . Tamsulosin HCl (FLOMAX) 0.4 MG CAPS Take 0.4 mg by mouth daily as needed.       . Testosterone (ANDROGEL PUMP) 1.25 GM/ACT (1%) GEL Apply once daily as directed       . TRADJENTA 5 MG TABS tablet TAKE 1 TABLET BY MOUTH EVERY DAY  30 tablet  5   No current facility-administered medications for this visit.    Past Medical History  Diagnosis Date  . Type II or unspecified type diabetes mellitus without mention of complication, not stated as uncontrolled   . Gouty arthropathy   . Calculus of kidney   . Other and unspecified hyperlipidemia   . Unspecified essential hypertension   . OSA (obstructive sleep apnea)     USES CPAP MACHINE   . Iron deficiency anemia, unspecified   . Diverticulosis of colon (without mention of hemorrhage) 09/05/2006    Colonoscopy   . Hx of colonic polyps 09/05/2006    Colonoscopy(ADENOMATOUS POLYP)  . Hiatal hernia 09/05/2006    EGD  . 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 GERD (gastroesophageal reflux disease) 07/26/2001    EGD(Chronic)  . Hemorrhoids     Past Surgical History  Procedure Laterality Date  . Knee surgery    . Hemorrhoid surgery      Family History  Problem Relation Age of Onset  . Heart attack Father   . Diabetes Mother   . Diabetes Brother     History   Social History  . Marital Status: Married    Spouse Name: N/A    Number of Children: 3  . Years of Education: N/A   Occupational History  . Paramedic    Social History Main Topics  . Smoking status: Never Smoker   . Smokeless tobacco: Never Used  . Alcohol Use: No  . Drug Use: No  . Sexual Activity: Not on file   Other Topics Concern  . Not on file   Social History Narrative  . No narrative on file    Review of Systems:  All systems reviewed.  They are negative to the above problem except as previously stated.  Vital Signs:  BP 144/84  Pulse 74  Ht 5' 9.25" (1.759 m)  Wt 293 lb (132.904 kg)  BMI 42.95 kg/m2  Physical Exam Patient is in NAD  Morbidly obese HEENT:  Normocephalic, atraumatic. EOMI, PERRLA.  Neck: JVP is normal.  No bruits.  Lungs: clear to auscultation. No rales no wheezes.  Heart: Regular rate and rhythm. Normal S1, S2. No S3.   No significant murmurs. PMI not displaced.  Abdomen:  Supple, nontender. Normal bowel sounds. No masses. No hepatomegaly.  Extremities:   Good distal pulses throughout. No lower extremity edema.  Musculoskeletal :moving all extremities.  Neuro:   alert and oriented x3.  CN II-XII grossly intact.  EKG:  SR 74 bpm  LVH.   Assessment and Plan:  1.  HTN  Adequate control  2.  CAD  Minimal   Denies CP  3.  HL  Continue on statin   4.  Obesity. COunselled on wt loss.  Stay active.

## 2013-10-08 NOTE — Patient Instructions (Signed)
Your physician recommends that you continue on your current medications as directed. Please refer to the Current Medication list given to you today.  Your physician wants you to follow-up in: 18 months with Dr. Tenny Craw. You will receive a reminder letter in the mail two months in advance. If you don't receive a letter, please call our office to schedule the follow-up appointment.  We will call Dr. Beryle Quant office to request a copy of most recent lab work.

## 2013-10-19 ENCOUNTER — Encounter: Payer: Self-pay | Admitting: Gastroenterology

## 2013-11-01 ENCOUNTER — Other Ambulatory Visit (HOSPITAL_BASED_OUTPATIENT_CLINIC_OR_DEPARTMENT_OTHER): Payer: Federal, State, Local not specified - PPO

## 2013-11-01 ENCOUNTER — Telehealth: Payer: Self-pay | Admitting: Internal Medicine

## 2013-11-01 ENCOUNTER — Encounter: Payer: Self-pay | Admitting: Physician Assistant

## 2013-11-01 ENCOUNTER — Ambulatory Visit (HOSPITAL_BASED_OUTPATIENT_CLINIC_OR_DEPARTMENT_OTHER): Payer: Federal, State, Local not specified - PPO | Admitting: Physician Assistant

## 2013-11-01 VITALS — BP 142/69 | HR 72 | Temp 98.0°F | Resp 20 | Ht 69.0 in | Wt 294.9 lb

## 2013-11-01 DIAGNOSIS — D472 Monoclonal gammopathy: Secondary | ICD-10-CM

## 2013-11-01 DIAGNOSIS — D509 Iron deficiency anemia, unspecified: Secondary | ICD-10-CM

## 2013-11-01 LAB — IRON AND TIBC CHCC
TIBC: 280 ug/dL (ref 202–409)
UIBC: 212 ug/dL (ref 117–376)

## 2013-11-01 LAB — CBC WITH DIFFERENTIAL/PLATELET
Eosinophils Absolute: 0.1 10*3/uL (ref 0.0–0.5)
HCT: 39.7 % (ref 38.4–49.9)
HGB: 13.2 g/dL (ref 13.0–17.1)
MCV: 94.2 fL (ref 79.3–98.0)
MONO#: 0.5 10*3/uL (ref 0.1–0.9)
NEUT#: 5.5 10*3/uL (ref 1.5–6.5)
NEUT%: 59.4 % (ref 39.0–75.0)
Platelets: 232 10*3/uL (ref 140–400)
RBC: 4.22 10*6/uL (ref 4.20–5.82)
RDW: 17 % — ABNORMAL HIGH (ref 11.0–14.6)
WBC: 9.2 10*3/uL (ref 4.0–10.3)

## 2013-11-01 LAB — FERRITIN CHCC: Ferritin: 103 ng/ml (ref 22–316)

## 2013-11-01 NOTE — Progress Notes (Signed)
[facility] Telephone:(336) [phone]   Fax:(336) [phone]  SHARED VISIT PROGRESS NOTE  Margaree Mackintosh, MD [address] [place] [address]  DIAGNOSIS:  1) monoclonal gammopathy of undetermined significance.  2) iron deficiency anemia.   PRIOR THERAPY: status post Feraheme infusion.   CURRENT THERAPY: Oral ferrous sulfate 325 mg by mouth twice a day.  INTERVAL HISTORY: Leonard [name] 67 y.o. male returns to the clinic today for follow up visit. He reports some fatigue related to work. 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 He reports that he had a colonoscopy performed by Dr. [name]. There was a bleeding polyp that has since been removed. He received a Feraheme on 08/10/2013 in 10/02/ 2014. His energy level generally is very good. He reports that Dr. Shelle Iron has him on CPAP. He discontinued his oral iron shortly after having his IV Fereheme. He denied having any bleeding issues he specifically no rectal bleeding.The patient denied having any significant nausea or vomiting but has mild constipation. He denied having any significant chest pain, shortness breath, cough or hemoptysis. He has a recent lab work including CBC, iron study and ferritin  and he is here for evaluation and discussion of his lab results.  MEDICAL HISTORY: Past Medical History  Diagnosis Date  . Type II or unspecified type diabetes mellitus without mention of complication, not stated as uncontrolled   . Gouty arthropathy   . Calculus of kidney   . Other and unspecified hyperlipidemia   . Unspecified essential hypertension   . OSA (obstructive sleep apnea)     USES CPAP MACHINE   . Iron deficiency anemia, unspecified   . Diverticulosis of colon (without mention of hemorrhage) 09/05/2006    Colonoscopy   . Hx of colonic polyps 09/05/2006    Colonoscopy(ADENOMATOUS POLYP)  . Hiatal hernia 09/05/2006    EGD  . 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 GERD (gastroesophageal reflux disease) 07/26/2001    EGD(Chronic)  . Hemorrhoids     ALLERGIES:  is allergic to dicloxacillin and cayenne pepper.  MEDICATIONS:  Current Outpatient Prescriptions  Medication Sig Dispense Refill  . allopurinol (ZYLOPRIM) 300 MG tablet TAKE 1 TABLET BY MOUTH DAILY TO PREVENT GOUT  30 tablet  11  . amLODipine-benazepril (LOTREL) 10-20 MG per capsule TAKE 1 CAPSULE BY MOUTH EVERY DAY  90 capsule  4  . aspirin 81 MG tablet Take 81 mg by mouth daily.        Marland Kitchen atorvastatin (LIPITOR) 10 MG tablet TAKE 1 TABLET BY MOUTH DAILY  90 tablet  0  . furosemide (LASIX) 40 MG tablet TAKE 1 TABLET (40 MG TOTAL) BY MOUTH DAILY.  90 tablet  3  . NEXIUM 40 MG capsule TAKE ONE CAPSULE BY MOUTH DAILY  90 capsule  3  . Tamsulosin HCl (FLOMAX) 0.4 MG CAPS Take 0.4 mg by mouth daily as needed.       . TRADJENTA 5 MG TABS tablet TAKE 1 TABLET BY MOUTH EVERY DAY  30 tablet  5  . NITROSTAT 0.4 MG SL tablet PLACE 1 TABLET UNDER TONGUE AS NEEDED FOR CHEST PAIN, MAY REPEAT EVERY 5 MINUTES FOR 3 TIMES  25 tablet  2  . Testosterone (ANDROGEL PUMP) 1.25 GM/ACT (1%) GEL Apply once daily as directed        No current facility-administered medications for this visit.    SURGICAL HISTORY:  Past Surgical History  Procedure Laterality Date  . Knee surgery    . Hemorrhoid surgery      REVIEW OF SYSTEMS:  Constitutional: positive for fatigue Eyes: negative Ears, nose, mouth, throat, and face: negative Respiratory: negative Cardiovascular: negative Gastrointestinal: negative Genitourinary:negative Integument/breast: negative Hematologic/lymphatic: negative Musculoskeletal:negative Neurological: negative Behavioral/Psych: negative Endocrine: negative Allergic/Immunologic: negative   PHYSICAL EXAMINATION: General appearance: alert, cooperative, fatigued and no distress Head: Normocephalic, without obvious abnormality, atraumatic Neck: no adenopathy  and thyroid not enlarged, symmetric, no tenderness/mass/nodules Lymph nodes: Cervical, supraclavicular, and axillary nodes normal. Resp: clear to auscultation bilaterally Back: symmetric, no curvature. ROM normal. No CVA tenderness. Cardio: regular rate and rhythm, S1, S2 normal, no murmur, click, rub or gallop GI: soft, non-tender; bowel sounds normal; no masses,  no organomegaly Extremities: extremities normal, atraumatic, no cyanosis or edema Neurologic: Alert and oriented X 3, normal strength and tone. Normal symmetric reflexes. Normal coordination and gait  ECOG PERFORMANCE STATUS: 1 - Symptomatic but completely ambulatory  Blood pressure 142/69, pulse 72, temperature 98 F (36.7 C), temperature source Oral, resp. rate 20, height 5\' 9"  (1.753 m), weight 294 lb 14.4 oz (133.766 kg).  LABORATORY DATA: Lab Results  Component Value Date   WBC 9.2 11/01/2013   HGB 13.2 11/01/2013   HCT 39.7 11/01/2013   MCV 94.2 11/01/2013   PLT 232 11/01/2013      Chemistry      Component Value Date/Time   NA 140 07/24/2013 0852   NA 140 08/31/2012 1044   K 3.7 07/24/2013 0852   K 4.2 08/31/2012 1044   CL 104 04/17/2013 0857   CL 104 08/31/2012 1044   CO2 25 07/24/2013 0852   CO2 24 08/31/2012 1044   BUN 19.8 07/24/2013 0852   BUN 22 08/31/2012 1044   CREATININE 1.2 07/24/2013 0852   CREATININE 1.30 08/31/2012 1044   CREATININE 1.3 08/25/2007 1458      Component Value Date/Time   CALCIUM 9.3 07/24/2013 0852   CALCIUM 9.4 08/31/2012 1044   ALKPHOS 69 07/24/2013 0852   ALKPHOS 66 03/16/2013 1235   AST 13 07/24/2013 0852   AST 14 03/16/2013 1235   ALT 16 07/24/2013 0852   ALT 12 03/16/2013 1235   BILITOT 0.35 07/24/2013 0852   BILITOT 0.5 03/16/2013 1235     Other lab studies: ferritin 21, serum iron 34, total iron-binding capacity 364 and iron saturation 9%. Free kappa light chain 2.45, free lambda light chain 2.30 with a kappa/lambda ratio of 1.07. Beta-2 microglobulin 1.86.  RADIOGRAPHIC STUDIES: No  results found.  ASSESSMENT AND PLAN:  1) iron deficiency anemia most likely secondary to GI blood loss. He is status post colonoscopy by Dr. [name] with removal of a bleeding polyp. Is also status post IV iron with feraheme. His hemoglobin is trending up and is now are within normal range at 13.2 g/dL. He will followup in 3 months with repeat CBC differential and iron studies. Patient was discussed with also seen by [name].  2) Monoclonal gammopathy of undetermined significance: no significant evidence for disease progression. The patient will continue on observation for now. He will return for a follow up visit in 3 months with repeat myeloma panel. The patient was advised to call immediately if he has any concerning symptoms in the interval.   The patient voices understanding of current disease status and treatment options and is in agreement with the current care plan.  All questions were answered. The patient knows to call the clinic  with any problems, questions or concerns. We can certainly see the patient much sooner if necessary.  Conni Slipper PA-C  ADDENDUM: Hematology/Oncology Attending: I had the face to face encounter with the patient. I recommended for her care plan. This is a very pleasant 67 years old African American male with history of monoclonal gammopathy of undetermined significance as well as history of iron deficiency anemia most likely secondary to bleeding colon polyps that were removed recently after colonoscopy. The patient has significant improvement in her anemia after removal of the bleeding colon polyps as well as Feraheme infusion.  The patient is feeling much better today. He will continue on observation with repeat CBC, iron study and ferritin as well as myeloma panel in 3 months. He was advised to call immediately if he has any concerning symptoms in the interval. Lajuana Matte., MD 11/05/2013

## 2013-11-01 NOTE — Telephone Encounter (Signed)
Gave pt appt lab and MArch 2015

## 2013-11-04 NOTE — Patient Instructions (Signed)
Followup in 3 months with repeat iron and multiple myeloma study

## 2013-11-09 ENCOUNTER — Other Ambulatory Visit: Payer: Self-pay | Admitting: Internal Medicine

## 2013-11-15 DIAGNOSIS — C61 Malignant neoplasm of prostate: Secondary | ICD-10-CM

## 2013-11-15 HISTORY — DX: Malignant neoplasm of prostate: C61

## 2013-11-27 ENCOUNTER — Other Ambulatory Visit: Payer: Self-pay | Admitting: Internal Medicine

## 2013-11-30 ENCOUNTER — Other Ambulatory Visit: Payer: Self-pay | Admitting: Internal Medicine

## 2013-12-13 ENCOUNTER — Other Ambulatory Visit: Payer: Federal, State, Local not specified - PPO | Admitting: Internal Medicine

## 2013-12-13 DIAGNOSIS — E785 Hyperlipidemia, unspecified: Secondary | ICD-10-CM

## 2013-12-13 DIAGNOSIS — Z79899 Other long term (current) drug therapy: Secondary | ICD-10-CM

## 2013-12-13 DIAGNOSIS — E119 Type 2 diabetes mellitus without complications: Secondary | ICD-10-CM

## 2013-12-13 DIAGNOSIS — D509 Iron deficiency anemia, unspecified: Secondary | ICD-10-CM

## 2013-12-13 DIAGNOSIS — Z125 Encounter for screening for malignant neoplasm of prostate: Secondary | ICD-10-CM

## 2013-12-13 LAB — COMPREHENSIVE METABOLIC PANEL
ALBUMIN: 3.9 g/dL (ref 3.5–5.2)
ALT: 19 U/L (ref 0–53)
AST: 16 U/L (ref 0–37)
Alkaline Phosphatase: 73 U/L (ref 39–117)
BUN: 18 mg/dL (ref 6–23)
CALCIUM: 9.2 mg/dL (ref 8.4–10.5)
CHLORIDE: 102 meq/L (ref 96–112)
CO2: 30 mEq/L (ref 19–32)
Creat: 1.08 mg/dL (ref 0.50–1.35)
Glucose, Bld: 97 mg/dL (ref 70–99)
POTASSIUM: 4.3 meq/L (ref 3.5–5.3)
Sodium: 141 mEq/L (ref 135–145)
Total Bilirubin: 0.5 mg/dL (ref 0.2–1.2)
Total Protein: 6.7 g/dL (ref 6.0–8.3)

## 2013-12-13 LAB — CBC WITH DIFFERENTIAL/PLATELET
Basophils Absolute: 0 10*3/uL (ref 0.0–0.1)
Basophils Relative: 0 % (ref 0–1)
Eosinophils Absolute: 0.1 10*3/uL (ref 0.0–0.7)
Eosinophils Relative: 2 % (ref 0–5)
HEMATOCRIT: 39.7 % (ref 39.0–52.0)
Hemoglobin: 13.2 g/dL (ref 13.0–17.0)
LYMPHS PCT: 40 % (ref 12–46)
Lymphs Abs: 3.6 10*3/uL (ref 0.7–4.0)
MCH: 30.6 pg (ref 26.0–34.0)
MCHC: 33.2 g/dL (ref 30.0–36.0)
MCV: 91.9 fL (ref 78.0–100.0)
MONO ABS: 0.6 10*3/uL (ref 0.1–1.0)
Monocytes Relative: 7 % (ref 3–12)
NEUTROS ABS: 4.7 10*3/uL (ref 1.7–7.7)
Neutrophils Relative %: 51 % (ref 43–77)
Platelets: 253 10*3/uL (ref 150–400)
RBC: 4.32 MIL/uL (ref 4.22–5.81)
RDW: 15.4 % (ref 11.5–15.5)
WBC: 9 10*3/uL (ref 4.0–10.5)

## 2013-12-13 LAB — HEMOGLOBIN A1C
HEMOGLOBIN A1C: 6.4 % — AB (ref <5.7)
MEAN PLASMA GLUCOSE: 137 mg/dL — AB (ref <117)

## 2013-12-13 LAB — LIPID PANEL
CHOL/HDL RATIO: 2 ratio
CHOLESTEROL: 134 mg/dL (ref 0–200)
HDL: 68 mg/dL (ref >39)
LDL Cholesterol: 55 mg/dL (ref 0–99)
Triglycerides: 56 mg/dL (ref <150)
VLDL: 11 mg/dL (ref 0–40)

## 2013-12-14 ENCOUNTER — Ambulatory Visit (INDEPENDENT_AMBULATORY_CARE_PROVIDER_SITE_OTHER): Payer: Federal, State, Local not specified - PPO | Admitting: Internal Medicine

## 2013-12-14 ENCOUNTER — Encounter: Payer: Self-pay | Admitting: Internal Medicine

## 2013-12-14 VITALS — BP 114/66 | HR 76 | Temp 97.7°F | Ht 68.25 in | Wt 290.0 lb

## 2013-12-14 DIAGNOSIS — Z23 Encounter for immunization: Secondary | ICD-10-CM

## 2013-12-14 DIAGNOSIS — K219 Gastro-esophageal reflux disease without esophagitis: Secondary | ICD-10-CM

## 2013-12-14 DIAGNOSIS — E119 Type 2 diabetes mellitus without complications: Secondary | ICD-10-CM

## 2013-12-14 DIAGNOSIS — Z8679 Personal history of other diseases of the circulatory system: Secondary | ICD-10-CM

## 2013-12-14 DIAGNOSIS — E785 Hyperlipidemia, unspecified: Secondary | ICD-10-CM

## 2013-12-14 DIAGNOSIS — Z0000 Encounter for general adult medical examination without abnormal findings: Secondary | ICD-10-CM

## 2013-12-14 DIAGNOSIS — R6882 Decreased libido: Secondary | ICD-10-CM

## 2013-12-14 DIAGNOSIS — R972 Elevated prostate specific antigen [PSA]: Secondary | ICD-10-CM

## 2013-12-14 LAB — POCT URINALYSIS DIPSTICK
Bilirubin, UA: NEGATIVE
GLUCOSE UA: NEGATIVE
Ketones, UA: NEGATIVE
Leukocytes, UA: NEGATIVE
Nitrite, UA: NEGATIVE
RBC UA: NEGATIVE
SPEC GRAV UA: 1.01
UROBILINOGEN UA: NEGATIVE
pH, UA: 7

## 2013-12-14 LAB — PSA: PSA: 4.78 ng/mL — ABNORMAL HIGH (ref <=4.00)

## 2013-12-14 MED ORDER — PNEUMOCOCCAL VAC POLYVALENT 25 MCG/0.5ML IJ INJ: 0.5000 mL | INJECTION | INTRAMUSCULAR | Status: DC

## 2013-12-14 NOTE — Patient Instructions (Addendum)
Continue same meds and return in 6 months. Cialis samples given. Try to diet, exercise, and lose weight.

## 2013-12-15 LAB — MICROALBUMIN, URINE: Microalb, Ur: 0.5 mg/dL (ref 0.00–1.89)

## 2014-01-15 ENCOUNTER — Telehealth: Payer: Self-pay | Admitting: Internal Medicine

## 2014-01-15 NOTE — Telephone Encounter (Signed)
[name]...patient needs prior authorization on his Nexium prescription.  Leonard [name] has the information in hand and will call on the auth tomorrow, 01/16/14.  Patient advised and verbalizes understanding of this information.  Advised patient to call back on 3/4 PRIOR to 1:00 p.m.

## 2014-01-23 ENCOUNTER — Other Ambulatory Visit (HOSPITAL_COMMUNITY): Payer: Self-pay | Admitting: Urology

## 2014-01-23 DIAGNOSIS — C61 Malignant neoplasm of prostate: Secondary | ICD-10-CM

## 2014-01-30 ENCOUNTER — Other Ambulatory Visit (HOSPITAL_BASED_OUTPATIENT_CLINIC_OR_DEPARTMENT_OTHER): Payer: Federal, State, Local not specified - PPO

## 2014-01-30 DIAGNOSIS — Z8603 Personal history of neoplasm of uncertain behavior: Secondary | ICD-10-CM

## 2014-01-30 DIAGNOSIS — D509 Iron deficiency anemia, unspecified: Secondary | ICD-10-CM

## 2014-01-30 DIAGNOSIS — Z8639 Personal history of other endocrine, nutritional and metabolic disease: Secondary | ICD-10-CM

## 2014-01-30 LAB — CBC WITH DIFFERENTIAL/PLATELET
BASO%: 0.5 % (ref 0.0–2.0)
BASOS ABS: 0.1 10*3/uL (ref 0.0–0.1)
EOS%: 1.2 % (ref 0.0–7.0)
Eosinophils Absolute: 0.1 10*3/uL (ref 0.0–0.5)
HEMATOCRIT: 40.6 % (ref 38.4–49.9)
HEMOGLOBIN: 13.4 g/dL (ref 13.0–17.1)
LYMPH%: 33.2 % (ref 14.0–49.0)
MCH: 31 pg (ref 27.2–33.4)
MCHC: 33 g/dL (ref 32.0–36.0)
MCV: 93.9 fL (ref 79.3–98.0)
MONO#: 0.7 10*3/uL (ref 0.1–0.9)
MONO%: 6.7 % (ref 0.0–14.0)
NEUT#: 6.2 10*3/uL (ref 1.5–6.5)
NEUT%: 58.4 % (ref 39.0–75.0)
Platelets: 268 10*3/uL (ref 140–400)
RBC: 4.32 10*6/uL (ref 4.20–5.82)
RDW: 14.9 % — ABNORMAL HIGH (ref 11.0–14.6)
WBC: 10.6 10*3/uL — ABNORMAL HIGH (ref 4.0–10.3)
lymph#: 3.5 10*3/uL — ABNORMAL HIGH (ref 0.9–3.3)

## 2014-01-30 LAB — FERRITIN CHCC: FERRITIN: 93 ng/mL (ref 22–316)

## 2014-01-30 LAB — IRON AND TIBC CHCC
%SAT: 24 % (ref 20–55)
IRON: 76 ug/dL (ref 42–163)
TIBC: 321 ug/dL (ref 202–409)
UIBC: 246 ug/dL (ref 117–376)

## 2014-02-01 LAB — KAPPA/LAMBDA LIGHT CHAINS
KAPPA LAMBDA RATIO: 1.29 (ref 0.26–1.65)
Kappa free light chain: 1.83 mg/dL (ref 0.33–1.94)
Lambda Free Lght Chn: 1.42 mg/dL (ref 0.57–2.63)

## 2014-02-01 LAB — IGG, IGA, IGM
IGG (IMMUNOGLOBIN G), SERUM: 1120 mg/dL (ref 650–1600)
IgA: 236 mg/dL (ref 68–379)
IgM, Serum: 131 mg/dL (ref 41–251)

## 2014-02-01 LAB — BETA 2 MICROGLOBULIN, SERUM: Beta-2 Microglobulin: 1.97 mg/L (ref <=2.51)

## 2014-02-06 ENCOUNTER — Encounter: Payer: Self-pay | Admitting: Internal Medicine

## 2014-02-06 ENCOUNTER — Ambulatory Visit (HOSPITAL_BASED_OUTPATIENT_CLINIC_OR_DEPARTMENT_OTHER): Payer: Federal, State, Local not specified - PPO | Admitting: Internal Medicine

## 2014-02-06 VITALS — BP 134/75 | HR 71 | Temp 97.4°F | Resp 18 | Ht 68.0 in | Wt 292.2 lb

## 2014-02-06 DIAGNOSIS — E119 Type 2 diabetes mellitus without complications: Secondary | ICD-10-CM

## 2014-02-06 DIAGNOSIS — D472 Monoclonal gammopathy: Secondary | ICD-10-CM

## 2014-02-06 DIAGNOSIS — D509 Iron deficiency anemia, unspecified: Secondary | ICD-10-CM

## 2014-02-06 DIAGNOSIS — Z8639 Personal history of other endocrine, nutritional and metabolic disease: Secondary | ICD-10-CM

## 2014-02-06 DIAGNOSIS — Z8603 Personal history of neoplasm of uncertain behavior: Secondary | ICD-10-CM

## 2014-02-06 NOTE — Progress Notes (Signed)
[facility] Telephone:(336) [phone]   Fax:(336) [phone]  OFFICE PROGRESS NOTE  [name], MD [address] [place] [address]  DIAGNOSIS:  1) monoclonal gammopathy of undetermined significance.  2) iron deficiency anemia.   PRIOR THERAPY: Feraheme infusion x2 doses, last dose was given in October of 2014.  CURRENT THERAPY: Observation.  INTERVAL HISTORY: Leonard [name] 68 y.o. male returns to the clinic today for follow up visit. He is feeling very well today with no significant fatigue or weakness. He denied having any bleeding issues he specifically no rectal bleeding. He is not taking any oral iron supplements at this point. The patient denied having any significant nausea or vomiting but has mild constipation. He denied having any significant chest pain, shortness breath, cough or hemoptysis. He has a recent lab work including CBC, iron study and ferritin as well as myeloma panel and he is here for evaluation and discussion of his lab results.  MEDICAL HISTORY: Past Medical History  Diagnosis Date  . Type II or unspecified type diabetes mellitus without mention of complication, not stated as uncontrolled   . Gouty arthropathy   . Calculus of kidney   . Other and unspecified hyperlipidemia   . Unspecified essential hypertension   . OSA (obstructive sleep apnea)     USES CPAP MACHINE   . Iron deficiency anemia, unspecified   . Diverticulosis of colon (without mention of hemorrhage) 09/05/2006    Colonoscopy   . Hx of colonic polyps 09/05/2006    Colonoscopy(ADENOMATOUS POLYP)  . Hiatal hernia 09/05/2006    EGD  . 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 GERD (gastroesophageal reflux disease) 07/26/2001    EGD(Chronic)  . Hemorrhoids     ALLERGIES:  is allergic to dicloxacillin and cayenne pepper.  MEDICATIONS:  Current Outpatient Prescriptions  Medication Sig Dispense Refill  . allopurinol (ZYLOPRIM)  300 MG tablet TAKE 1 TABLET BY MOUTH DAILY TO PREVENT GOUT  30 tablet  11  . amLODipine-benazepril (LOTREL) 10-20 MG per capsule TAKE 1 CAPSULE BY MOUTH EVERY DAY  90 capsule  4  . aspirin 81 MG tablet Take 81 mg by mouth daily.        Marland Kitchen atorvastatin (LIPITOR) 10 MG tablet TAKE 1 TABLET BY MOUTH DAILY  90 tablet  0  . furosemide (LASIX) 40 MG tablet TAKE 1 TABLET (40 MG TOTAL) BY MOUTH DAILY.  90 tablet  3  . NEXIUM 40 MG capsule TAKE ONE CAPSULE BY MOUTH DAILY  90 capsule  3  . NITROSTAT 0.4 MG SL tablet PLACE 1 TABLET UNDER TONGUE AS NEEDED FOR CHEST PAIN, MAY REPEAT EVERY 5 MINUTES FOR 3 TIMES  25 tablet  2  . Tamsulosin HCl (FLOMAX) 0.4 MG CAPS Take 0.4 mg by mouth daily as needed.       . Testosterone (ANDROGEL PUMP) 1.25 GM/ACT (1%) GEL Apply once daily as directed       . TRADJENTA 5 MG TABS tablet TAKE 1 TABLET BY MOUTH EVERY DAY  30 tablet  5   Current Facility-Administered Medications  Medication Dose Route Frequency Provider Last Rate Last Dose  . pneumococcal 23 valent vaccine (PNU-IMMUNE) injection 0.5 mL  0.5 mL Intramuscular Tomorrow-1000 [name], MD        SURGICAL HISTORY:  Past Surgical History  Procedure Laterality Date  . Knee surgery    . Hemorrhoid surgery      REVIEW OF  SYSTEMS:  Constitutional: positive for fatigue Eyes: negative Ears, nose, mouth, throat, and face: negative Respiratory: negative Cardiovascular: negative Gastrointestinal: negative Genitourinary:negative Integument/breast: negative Hematologic/lymphatic: negative Musculoskeletal:negative Neurological: negative Behavioral/Psych: negative Endocrine: negative Allergic/Immunologic: negative   PHYSICAL EXAMINATION: General appearance: alert, cooperative, fatigued and no distress Head: Normocephalic, without obvious abnormality, atraumatic Neck: no adenopathy and thyroid not enlarged, symmetric, no tenderness/mass/nodules Lymph nodes: Cervical, supraclavicular, and axillary nodes  normal. Resp: clear to auscultation bilaterally Back: symmetric, no curvature. ROM normal. No CVA tenderness. Cardio: regular rate and rhythm, S1, S2 normal, no murmur, click, rub or gallop GI: soft, non-tender; bowel sounds normal; no masses,  no organomegaly Extremities: extremities normal, atraumatic, no cyanosis or edema Neurologic: Alert and oriented X 3, normal strength and tone. Normal symmetric reflexes. Normal coordination and gait  ECOG PERFORMANCE STATUS: 1 - Symptomatic but completely ambulatory  Blood pressure 134/75, pulse 71, temperature 97.4 F (36.3 C), temperature source Oral, resp. rate 18, height 5\' 8"  (1.727 m), weight 292 lb 3.2 oz (132.541 kg).  LABORATORY DATA: Lab Results  Component Value Date   WBC 10.6* 01/30/2014   HGB 13.4 01/30/2014   HCT 40.6 01/30/2014   MCV 93.9 01/30/2014   PLT 268 01/30/2014      Chemistry      Component Value Date/Time   NA 141 12/13/2013 0934   NA 140 07/24/2013 0852   K 4.3 12/13/2013 0934   K 3.7 07/24/2013 0852   CL 102 12/13/2013 0934   CL 104 04/17/2013 0857   CO2 30 12/13/2013 0934   CO2 25 07/24/2013 0852   BUN 18 12/13/2013 0934   BUN 19.8 07/24/2013 0852   CREATININE 1.08 12/13/2013 0934   CREATININE 1.2 07/24/2013 0852   CREATININE 1.3 08/25/2007 1458      Component Value Date/Time   CALCIUM 9.2 12/13/2013 0934   CALCIUM 9.3 07/24/2013 0852   ALKPHOS 73 12/13/2013 0934   ALKPHOS 69 07/24/2013 0852   AST 16 12/13/2013 0934   AST 13 07/24/2013 0852   ALT 19 12/13/2013 0934   ALT 16 07/24/2013 0852   BILITOT 0.5 12/13/2013 0934   BILITOT 0.35 07/24/2013 0852     Other lab studies: ferritin 93, serum iron 76, total iron-binding capacity 321 and iron saturation 24%. Free kappa light chain 1.83, free lambda light chain 1.42 with a kappa/lambda ratio of 1.29. Beta-2 microglobulin 1.97.  RADIOGRAPHIC STUDIES: No results found.  ASSESSMENT AND PLAN:  1) iron deficiency anemia most likely secondary to GI blood loss. His anemia has completely  resolved at this point and the patient is currently off iron supplements and doing very well.  2) Monoclonal gammopathy of undetermined significance: no significant evidence for disease progression. The patient will continue on observation for now. He would come back for follow up visit in 6 months with repeat myeloma panel and iron study. The patient was advised to call immediately if he has any concerning symptoms in the interval.  The patient voices understanding of current disease status and treatment options and is in agreement with the current care plan.  All questions were answered. The patient knows to call the clinic with any problems, questions or concerns. We can certainly see the patient much sooner if necessary.  Disclaimer: This note was dictated with voice recognition software. Similar sounding words can inadvertently be transcribed and may not be corrected upon review.

## 2014-02-07 ENCOUNTER — Telehealth: Payer: Self-pay | Admitting: *Deleted

## 2014-02-07 NOTE — Telephone Encounter (Signed)
Per 3/25 POF I have scheduled appats. Patietn called and giving appts. I have also mailed a calendar

## 2014-02-08 ENCOUNTER — Encounter (HOSPITAL_COMMUNITY)
Admission: RE | Admit: 2014-02-08 | Discharge: 2014-02-08 | Disposition: A | Discharge status: Home / Self Care | Payer: Federal, State, Local not specified - PPO | Source: Ambulatory Visit | Attending: Urology | Admitting: Urology

## 2014-02-08 DIAGNOSIS — C61 Malignant neoplasm of prostate: Secondary | ICD-10-CM

## 2014-02-08 MED ORDER — TECHNETIUM TC 99M MEDRONATE IV KIT
24.8000 | PACK | Freq: Once | INTRAVENOUS | Status: AC | PRN
  Administered 2014-02-08: 24.8 via INTRAVENOUS

## 2014-02-22 ENCOUNTER — Other Ambulatory Visit: Payer: Self-pay | Admitting: Internal Medicine

## 2014-03-13 ENCOUNTER — Other Ambulatory Visit: Payer: Self-pay | Admitting: Urology

## 2014-03-14 ENCOUNTER — Other Ambulatory Visit: Payer: Self-pay | Admitting: Urology

## 2014-03-15 ENCOUNTER — Ambulatory Visit (INDEPENDENT_AMBULATORY_CARE_PROVIDER_SITE_OTHER): Payer: Federal, State, Local not specified - PPO | Admitting: Pulmonary Disease

## 2014-03-15 ENCOUNTER — Encounter: Payer: Self-pay | Admitting: Pulmonary Disease

## 2014-03-15 ENCOUNTER — Encounter (INDEPENDENT_AMBULATORY_CARE_PROVIDER_SITE_OTHER): Payer: Self-pay

## 2014-03-15 VITALS — BP 138/76 | HR 70 | Ht 69.25 in | Wt 297.0 lb

## 2014-03-15 DIAGNOSIS — G4733 Obstructive sleep apnea (adult) (pediatric): Secondary | ICD-10-CM

## 2014-03-15 NOTE — Progress Notes (Signed)
   Subjective:    Patient ID: Leonard [name], male    DOB: 17-Feb-1946, 68 y.o.   MRN: [id number]  HPI The patient comes in today for followup of his obstructive sleep apnea. He is wearing CPAP compliantly, and feels that he is sleeping well with the device. He denies any issues with his daytime alertness, and his weight is been stable since the last visit.   Review of Systems  Constitutional: Negative for fever and unexpected weight change.  HENT: Negative for congestion, dental problem, ear pain, nosebleeds, postnasal drip, rhinorrhea, sinus pressure, sneezing, sore throat and trouble swallowing.   Eyes: Negative for redness and itching.  Respiratory: Negative for cough, chest tightness, shortness of breath and wheezing.   Cardiovascular: Negative for palpitations and leg swelling.  Gastrointestinal: Negative for nausea and vomiting.  Genitourinary: Negative for dysuria.  Musculoskeletal: Negative for joint swelling.  Skin: Negative for rash.  Neurological: Negative for headaches.  Hematological: Does not bruise/bleed easily.  Psychiatric/Behavioral: Negative for dysphoric mood. The patient is not nervous/anxious.        Objective:   Physical Exam Overweight male in no acute distress Nose without purulence or discharge noted No skin breakdown or pressure necrosis from the CPAP mask Neck without lymphadenopathy or thyromegaly Lower extremities with mild edema, no cyanosis Alert and oriented, moves all 4 extremities.       Assessment & Plan:

## 2014-03-15 NOTE — Assessment & Plan Note (Signed)
The patient is doing very well with CPAP, and is satisfied with his sleep and daytime alertness.  I've asked him to keep up with his mask changes and supplies, and to work more aggressively on weight loss. I will see him back in one year if doing well.

## 2014-03-15 NOTE — Patient Instructions (Signed)
Continue on cpap, and keep up with mask changes and supplies. Work on weight loss followup with me again in one year if doing well.  

## 2014-04-04 NOTE — Progress Notes (Signed)
HPI Patient is a 68 year old iwht a history of HTN, minimal CAD Cath 2007, sleep apnea and HL.  I saw hiim in clinic in Winter 2014.   He has a history of atypical CP He is being evaluated for surgery Currently he denies CP  No dizziness.  No SOB   Doing some walking.   Allergies  Allergen Reactions  . Dicloxacillin Rash  . Cayenne Pepper [Cayenne] Swelling    Current Outpatient Prescriptions  Medication Sig Dispense Refill  . allopurinol (ZYLOPRIM) 300 MG tablet TAKE 1 TABLET BY MOUTH DAILY TO PREVENT GOUT  30 tablet  11  . amLODipine-benazepril (LOTREL) 10-20 MG per capsule TAKE 1 CAPSULE BY MOUTH EVERY DAY  90 capsule  4  . aspirin 81 MG tablet Take 81 mg by mouth daily.        Marland Kitchen atorvastatin (LIPITOR) 10 MG tablet TAKE 1 TABLET BY MOUTH DAILY  90 tablet  0  . furosemide (LASIX) 40 MG tablet TAKE 1 TABLET (40 MG TOTAL) BY MOUTH DAILY.  90 tablet  3  . NEXIUM 40 MG capsule TAKE ONE CAPSULE BY MOUTH DAILY  90 capsule  3  . NITROSTAT 0.4 MG SL tablet PLACE 1 TABLET UNDER TONGUE AS NEEDED FOR CHEST PAIN, MAY REPEAT EVERY 5 MINUTES FOR 3 TIMES  25 tablet  2  . Tamsulosin HCl (FLOMAX) 0.4 MG CAPS Take 0.4 mg by mouth daily as needed.       . TRADJENTA 5 MG TABS tablet TAKE 1 TABLET BY MOUTH EVERY DAY  30 tablet  5   Current Facility-Administered Medications  Medication Dose Route Frequency Provider Last Rate Last Dose  . pneumococcal 23 valent vaccine (PNU-IMMUNE) injection 0.5 mL  0.5 mL Intramuscular Tomorrow-1000 [name], MD        Past Medical History  Diagnosis Date  . Type II or unspecified type diabetes mellitus without mention of complication, not stated as uncontrolled   . Gouty arthropathy   . Calculus of kidney   . Other and unspecified hyperlipidemia   . Unspecified essential hypertension   . OSA (obstructive sleep apnea)     USES CPAP MACHINE   . Iron deficiency anemia, unspecified   . Diverticulosis of colon (without mention of hemorrhage) 09/05/2006   Colonoscopy   . Hx of colonic polyps 09/05/2006    Colonoscopy(ADENOMATOUS POLYP)  . Hiatal hernia 09/05/2006    EGD  . 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 GERD (gastroesophageal reflux disease) 07/26/2001    EGD(Chronic)  . Hemorrhoids     Past Surgical History  Procedure Laterality Date  . Knee surgery    . Hemorrhoid surgery      Family History  Problem Relation Age of Onset  . Heart attack Father   . Diabetes Mother   . Diabetes Brother     History   Social History  . Marital Status: Married    Spouse Name: N/A    Number of Children: 3  . Years of Education: N/A   Occupational History  . Tour manager    Social History Main Topics  . Smoking status: Never Smoker   . Smokeless tobacco: Never Used  . Alcohol Use: No  . Drug Use: No  . Sexual Activity: Not on file   Other Topics Concern  . Not on file   Social History Narrative  . No narrative on file    Review of Systems:  All systems  reviewed.  They are negative to the above problem except as previously stated.  Vital Signs: There were no vitals taken for this visit.  Physical Exam Patient is in NAD  Morbidly obese HEENT:  Normocephalic, atraumatic. EOMI, PERRLA.  Neck: JVP is normal.  No bruits.  Lungs: clear to auscultation. No rales no wheezes.  Heart: Regular rate and rhythm. Normal S1, S2. No S3.   No significant murmurs. PMI not displaced.  Abdomen:  Supple, nontender. Normal bowel sounds. No masses. No hepatomegaly.  Extremities:   Good distal pulses throughout. No lower extremity edema.  Musculoskeletal :moving all extremities.  Neuro:   alert and oriented x3.  CN II-XII grossly intact.  EKG:  SR 74 bpm   Assessment and Plan:  1.  HTN   BP is a little high today  With upcoming surgery I would not change regimen  Follow    2.  CAD  Minimal   Denies CP  3.  HL  Continue on statin   4.  Obesity. COunselled on wt loss.  Stay active.  5.  Prostate CA   Patient to undergo surgery soon  From a cardiac standpoint I think he is at low risk for major cardiac event and ok to proceed.  F/u in 9 months.

## 2014-04-05 ENCOUNTER — Ambulatory Visit: Payer: Federal, State, Local not specified - PPO | Admitting: Internal Medicine

## 2014-04-05 ENCOUNTER — Encounter: Payer: Self-pay | Admitting: Internal Medicine

## 2014-04-05 ENCOUNTER — Ambulatory Visit (INDEPENDENT_AMBULATORY_CARE_PROVIDER_SITE_OTHER): Payer: Federal, State, Local not specified - PPO | Admitting: Internal Medicine

## 2014-04-05 VITALS — BP 167/74 | HR 74 | Ht 69.25 in | Wt 294.4 lb

## 2014-04-05 DIAGNOSIS — I2510 Atherosclerotic heart disease of native coronary artery without angina pectoris: Secondary | ICD-10-CM

## 2014-04-05 NOTE — Patient Instructions (Addendum)
Your physician wants you to follow-up in: [place]. You will receive a reminder letter in the mail two months in advance. If you don't receive a letter, please call our office to schedule the follow-up appointment.   NO CHANGES WERE MADE TODAY

## 2014-04-15 ENCOUNTER — Telehealth: Payer: Self-pay | Admitting: Internal Medicine

## 2014-04-15 NOTE — Telephone Encounter (Signed)
Received request from Nurse fax box, documents faxed for surgical clearance. To: [facility]  Fax number: [phone] Attention: 6.1.15/km

## 2014-04-22 ENCOUNTER — Encounter (HOSPITAL_COMMUNITY): Payer: Self-pay | Admitting: Pharmacy Technician

## 2014-04-24 ENCOUNTER — Encounter (HOSPITAL_COMMUNITY)
Admission: RE | Admit: 2014-04-24 | Discharge: 2014-04-24 | Disposition: A | Discharge status: Home / Self Care | Payer: Federal, State, Local not specified - PPO | Source: Ambulatory Visit | Attending: Urology | Admitting: Urology

## 2014-04-24 ENCOUNTER — Encounter (HOSPITAL_COMMUNITY): Payer: Self-pay

## 2014-04-24 ENCOUNTER — Ambulatory Visit (HOSPITAL_COMMUNITY)
Admission: RE | Admit: 2014-04-24 | Discharge: 2014-04-24 | Disposition: A | Discharge status: Home / Self Care | Payer: Federal, State, Local not specified - PPO | Source: Ambulatory Visit | Attending: Urology | Admitting: Urology

## 2014-04-24 DIAGNOSIS — Z01818 Encounter for other preprocedural examination: Secondary | ICD-10-CM | POA: Insufficient documentation

## 2014-04-24 HISTORY — DX: Atherosclerotic heart disease of native coronary artery without angina pectoris: I25.10

## 2014-04-24 LAB — CBC
HCT: 38.9 % — ABNORMAL LOW (ref 39.0–52.0)
Hemoglobin: 12.7 g/dL — ABNORMAL LOW (ref 13.0–17.0)
MCH: 29.9 pg (ref 26.0–34.0)
MCHC: 32.6 g/dL (ref 30.0–36.0)
MCV: 91.5 fL (ref 78.0–100.0)
Platelets: 284 10*3/uL (ref 150–400)
RBC: 4.25 MIL/uL (ref 4.22–5.81)
RDW: 14.4 % (ref 11.5–15.5)
WBC: 11.3 10*3/uL — AB (ref 4.0–10.5)

## 2014-04-24 LAB — BASIC METABOLIC PANEL
BUN: 20 mg/dL (ref 6–23)
CHLORIDE: 100 meq/L (ref 96–112)
CO2: 28 meq/L (ref 19–32)
CREATININE: 1.24 mg/dL (ref 0.50–1.35)
Calcium: 9.9 mg/dL (ref 8.4–10.5)
GFR calc Af Amer: 68 mL/min — ABNORMAL LOW (ref >90)
GFR calc non Af Amer: 59 mL/min — ABNORMAL LOW (ref >90)
Glucose, Bld: 109 mg/dL — ABNORMAL HIGH (ref 70–99)
Potassium: 4.2 mEq/L (ref 3.7–5.3)
Sodium: 141 mEq/L (ref 137–147)

## 2014-04-24 LAB — ABO/RH: ABO/RH(D): A POS

## 2014-04-24 NOTE — Pre-Procedure Instructions (Signed)
EKG AND CARDIOLOGY OFFICE VISIT 04/05/14 - WITH CLEARANCE FOR SURGERY IN EPIC FROM DR. P. ROSS. CXR WAS DONE TODAY PREOP AT [facility].

## 2014-04-24 NOTE — Patient Instructions (Signed)
FOLLOW INSTRUCTIONS FOR BOWEL PREP DAY BEFORE YOUR SURGERY - INSTRUCTIONS WERE FROM DR. MANNY'S OFFICE.  YOUR SURGERY IS SCHEDULED AT [facility]  ON:   Wednesday  6/17  REPORT TO  SHORT STAY CENTER AT:  6:30 AM   PLEASE COME IN THE [place] ENTRANCE AND FOLLOW SIGNS TO SHORT STAY CENTER.  DO NOT EAT OR DRINK ANYTHING AFTER MIDNIGHT THE NIGHT BEFORE YOUR SURGERY.  YOU MAY BRUSH YOUR TEETH, RINSE OUT YOUR MOUTH--BUT NO WATER, NO FOOD, NO CHEWING GUM, NO MINTS, NO CANDIES, NO CHEWING TOBACCO.  PLEASE TAKE THE FOLLOWING MEDICATIONS THE AM OF YOUR SURGERY WITH A FEW SIPS OF WATER:  ALLOPURINOL, ATORVASTATIN, ESOMEPRAZOLE, TAMSULOSIN.   IF YOU ARE DIABETIC:  DO NOT TAKE ANY DIABETIC MEDICATIONS THE AM OF YOUR SURGERY.   IF YOU HAVE SLEEP APNEA AND USE CPAP OR BIPAP--PLEASE BRING THE MASK AND THE TUBING.  DO NOT BRING YOUR MACHINE.  DO NOT BRING VALUABLES, MONEY, CREDIT CARDS.  DO NOT WEAR JEWELRY, MAKE-UP, NAIL POLISH AND NO METAL PINS OR CLIPS IN YOUR HAIR. CONTACT LENS, DENTURES / PARTIALS, GLASSES SHOULD NOT BE WORN TO SURGERY AND IN MOST CASES-HEARING AIDS WILL NEED TO BE REMOVED.  BRING YOUR GLASSES CASE, ANY EQUIPMENT NEEDED FOR YOUR CONTACT LENS. FOR PATIENTS ADMITTED TO THE HOSPITAL--CHECK OUT TIME THE DAY OF DISCHARGE IS 11:00 AM.  ALL INPATIENT ROOMS ARE PRIVATE - WITH BATHROOM, TELEPHONE, TELEVISION AND WIFI INTERNET.                                                     PLEASE READ OVER ANY  FACT SHEETS THAT YOU WERE GIVEN: MRSA INFORMATION, BLOOD TRANSFUSION INFORMATION, INCENTIVE SPIROMETER INFORMATION.  PLEASE BE AWARE THAT YOU MAY NEED ADDITIONAL BLOOD DRAWN DAY OF YOUR SURGERY  _______________________________________________________________________   [facility] - Preparing for Surgery Before surgery, you can play an important role.  Because skin is not sterile, your skin needs to be as free of germs as possible.  You can reduce the number of germs  on your skin by washing with CHG (chlorahexidine gluconate) soap before surgery.  CHG is an antiseptic cleaner which kills germs and bonds with the skin to continue killing germs even after washing. Please DO NOT use if you have an allergy to CHG or antibacterial soaps.  If your skin becomes reddened/irritated stop using the CHG and inform your nurse when you arrive at Short Stay. Do not shave (including legs and underarms) for at least 48 hours prior to the first CHG shower.  You may shave your face/neck. Please follow these instructions carefully:  1.  Shower with CHG Soap the night before surgery and the  morning of Surgery.  2.  If you choose to wash your hair, wash your hair first as usual with your  normal  shampoo.  3.  After you shampoo, rinse your hair and body thoroughly to remove the  shampoo.                           4.  Use CHG as you would any other liquid soap.  You can apply chg directly  to the skin and wash  Gently with a scrungie or clean washcloth.  5.  Apply the CHG Soap to your body ONLY FROM THE NECK DOWN.   Do not use on face/ open                           Wound or open sores. Avoid contact with eyes, ears mouth and genitals (private parts).                       Wash face,  Genitals (private parts) with your normal soap.             6.  Wash thoroughly, paying special attention to the area where your surgery  will be performed.  7.  Thoroughly rinse your body with warm water from the neck down.  8.  DO NOT shower/wash with your normal soap after using and rinsing off  the CHG Soap.                9.  Pat yourself dry with a clean towel.            10.  Wear clean pajamas.            11.  Place clean sheets on your bed the night of your first shower and do not  sleep with pets. Day of Surgery : Do not apply any lotions/deodorants the morning of surgery.  Please wear clean clothes to the hospital/surgery center.  FAILURE TO FOLLOW THESE INSTRUCTIONS  MAY RESULT IN THE CANCELLATION OF YOUR SURGERY PATIENT SIGNATURE_________________________________  NURSE SIGNATURE__________________________________  ________________________________________________________________________  WHAT IS A BLOOD TRANSFUSION? Blood Transfusion Information  A transfusion is the replacement of blood or some of its parts. Blood is made up of multiple cells which provide different functions.  Red blood cells carry oxygen and are used for blood loss replacement.  White blood cells fight against infection.  Platelets control bleeding.  Plasma helps clot blood.  Other blood products are available for specialized needs, such as hemophilia or other clotting disorders. BEFORE THE TRANSFUSION  Who gives blood for transfusions?   Healthy volunteers who are fully evaluated to make sure their blood is safe. This is blood bank blood. Transfusion therapy is the safest it has ever been in the practice of medicine. Before blood is taken from a donor, a complete history is taken to make sure that person has no history of diseases nor engages in risky social behavior (examples are intravenous drug use or sexual activity with multiple partners). The donor's travel history is screened to minimize risk of transmitting infections, such as malaria. The donated blood is tested for signs of infectious diseases, such as HIV and hepatitis. The blood is then tested to be sure it is compatible with you in order to minimize the chance of a transfusion reaction. If you or a relative donates blood, this is often done in anticipation of surgery and is not appropriate for emergency situations. It takes many days to process the donated blood. RISKS AND COMPLICATIONS Although transfusion therapy is very safe and saves many lives, the main dangers of transfusion include:   Getting an infectious disease.  Developing a transfusion reaction. This is an allergic reaction to something in the blood you  were given. Every precaution is taken to prevent this. The decision to have a blood transfusion has been considered carefully by your caregiver before blood is given. Blood is not given unless the benefits  outweigh the risks. AFTER THE TRANSFUSION  Right after receiving a blood transfusion, you will usually feel much better and more energetic. This is especially true if your red blood cells have gotten low (anemic). The transfusion raises the level of the red blood cells which carry oxygen, and this usually causes an energy increase.  The nurse administering the transfusion will monitor you carefully for complications. HOME CARE INSTRUCTIONS  No special instructions are needed after a transfusion. You may find your energy is better. Speak with your caregiver about any limitations on activity for underlying diseases you may have. SEEK MEDICAL CARE IF:   Your condition is not improving after your transfusion.  You develop redness or irritation at the intravenous (IV) site. SEEK IMMEDIATE MEDICAL CARE IF:  Any of the following symptoms occur over the next 12 hours:  Shaking chills.  You have a temperature by mouth above 102 F (38.9 C), not controlled by medicine.  Chest, back, or muscle pain.  People around you feel you are not acting correctly or are confused.  Shortness of breath or difficulty breathing.  Dizziness and fainting.  You get a rash or develop hives.  You have a decrease in urine output.  Your urine turns a dark color or changes to pink, red, or brown. Any of the following symptoms occur over the next 10 days:  You have a temperature by mouth above 102 F (38.9 C), not controlled by medicine.  Shortness of breath.  Weakness after normal activity.  The white part of the eye turns yellow (jaundice).  You have a decrease in the amount of urine or are urinating less often.  Your urine turns a dark color or changes to pink, red, or brown. Document Released:  10/29/2000 Document Revised: 01/24/2012 Document Reviewed: 06/17/2008 [facility] Patient Information 2014 [place], [place].  _______________________________________________________________________

## 2014-05-01 ENCOUNTER — Inpatient Hospital Stay (HOSPITAL_COMMUNITY)
Admission: RE | Admit: 2014-05-01 | Discharge: 2014-05-03 | DRG: 707 | Disposition: A | Discharge status: Home / Self Care | Payer: Federal, State, Local not specified - PPO | Source: Ambulatory Visit | Attending: Urology | Admitting: Urology

## 2014-05-01 ENCOUNTER — Encounter (HOSPITAL_COMMUNITY)
Admission: RE | Disposition: A | Discharge status: Home / Self Care | Payer: Self-pay | Source: Ambulatory Visit | Attending: Urology

## 2014-05-01 ENCOUNTER — Encounter (HOSPITAL_COMMUNITY): Payer: Self-pay | Admitting: Anesthesiology

## 2014-05-01 ENCOUNTER — Inpatient Hospital Stay (HOSPITAL_COMMUNITY): Payer: Federal, State, Local not specified - PPO | Admitting: Anesthesiology

## 2014-05-01 ENCOUNTER — Encounter (HOSPITAL_COMMUNITY): Payer: Federal, State, Local not specified - PPO | Admitting: Anesthesiology

## 2014-05-01 DIAGNOSIS — G4733 Obstructive sleep apnea (adult) (pediatric): Secondary | ICD-10-CM | POA: Diagnosis present

## 2014-05-01 DIAGNOSIS — Z860100 Personal history of colon polyps, unspecified: Secondary | ICD-10-CM

## 2014-05-01 DIAGNOSIS — E119 Type 2 diabetes mellitus without complications: Secondary | ICD-10-CM | POA: Diagnosis present

## 2014-05-01 DIAGNOSIS — Z833 Family history of diabetes mellitus: Secondary | ICD-10-CM

## 2014-05-01 DIAGNOSIS — Z79899 Other long term (current) drug therapy: Secondary | ICD-10-CM

## 2014-05-01 DIAGNOSIS — E785 Hyperlipidemia, unspecified: Secondary | ICD-10-CM | POA: Diagnosis present

## 2014-05-01 DIAGNOSIS — Z6841 Body Mass Index (BMI) 40.0 and over, adult: Secondary | ICD-10-CM

## 2014-05-01 DIAGNOSIS — R3989 Other symptoms and signs involving the genitourinary system: Secondary | ICD-10-CM | POA: Diagnosis present

## 2014-05-01 DIAGNOSIS — K219 Gastro-esophageal reflux disease without esophagitis: Secondary | ICD-10-CM | POA: Diagnosis present

## 2014-05-01 DIAGNOSIS — E291 Testicular hypofunction: Secondary | ICD-10-CM | POA: Diagnosis present

## 2014-05-01 DIAGNOSIS — M109 Gout, unspecified: Secondary | ICD-10-CM | POA: Diagnosis present

## 2014-05-01 DIAGNOSIS — I10 Essential (primary) hypertension: Secondary | ICD-10-CM | POA: Diagnosis present

## 2014-05-01 DIAGNOSIS — Z8249 Family history of ischemic heart disease and other diseases of the circulatory system: Secondary | ICD-10-CM

## 2014-05-01 DIAGNOSIS — R6882 Decreased libido: Secondary | ICD-10-CM | POA: Diagnosis present

## 2014-05-01 DIAGNOSIS — I2510 Atherosclerotic heart disease of native coronary artery without angina pectoris: Secondary | ICD-10-CM | POA: Diagnosis present

## 2014-05-01 DIAGNOSIS — C61 Malignant neoplasm of prostate: Principal | ICD-10-CM | POA: Diagnosis present

## 2014-05-01 DIAGNOSIS — N529 Male erectile dysfunction, unspecified: Secondary | ICD-10-CM | POA: Diagnosis present

## 2014-05-01 DIAGNOSIS — K5730 Diverticulosis of large intestine without perforation or abscess without bleeding: Secondary | ICD-10-CM | POA: Diagnosis present

## 2014-05-01 DIAGNOSIS — Z888 Allergy status to other drugs, medicaments and biological substances status: Secondary | ICD-10-CM

## 2014-05-01 DIAGNOSIS — D509 Iron deficiency anemia, unspecified: Secondary | ICD-10-CM | POA: Diagnosis present

## 2014-05-01 DIAGNOSIS — E669 Obesity, unspecified: Secondary | ICD-10-CM | POA: Diagnosis present

## 2014-05-01 HISTORY — PX: ROBOT ASSISTED LAPAROSCOPIC RADICAL PROSTATECTOMY: SHX5141

## 2014-05-01 HISTORY — PX: LYMPHADENECTOMY: SHX5960

## 2014-05-01 LAB — HEMOGLOBIN AND HEMATOCRIT, BLOOD
HEMATOCRIT: 35.9 % — AB (ref 39.0–52.0)
Hemoglobin: 11.7 g/dL — ABNORMAL LOW (ref 13.0–17.0)

## 2014-05-01 LAB — GLUCOSE, CAPILLARY
GLUCOSE-CAPILLARY: 117 mg/dL — AB (ref 70–99)
GLUCOSE-CAPILLARY: 179 mg/dL — AB (ref 70–99)
Glucose-Capillary: 163 mg/dL — ABNORMAL HIGH (ref 70–99)
Glucose-Capillary: 166 mg/dL — ABNORMAL HIGH (ref 70–99)
Glucose-Capillary: 189 mg/dL — ABNORMAL HIGH (ref 70–99)
Glucose-Capillary: 139 mg/dL — ABNORMAL HIGH (ref 70–99)

## 2014-05-01 LAB — TYPE AND SCREEN
ABO/RH(D): A POS
ANTIBODY SCREEN: NEGATIVE

## 2014-05-01 SURGERY — ROBOTIC ASSISTED LAPAROSCOPIC RADICAL PROSTATECTOMY
Anesthesia: General | Site: Prostate

## 2014-05-01 MED ORDER — MIDAZOLAM HCL 2 MG/2ML IJ SOLN
INTRAMUSCULAR | Status: AC
  Filled 2014-05-01: qty 2

## 2014-05-01 MED ORDER — BUPIVACAINE LIPOSOME 1.3 % IJ SUSP
INTRAMUSCULAR | Status: DC | PRN
  Administered 2014-05-01: 20 mL

## 2014-05-01 MED ORDER — FENTANYL CITRATE 0.05 MG/ML IJ SOLN
25.0000 ug | INTRAMUSCULAR | Status: DC | PRN
  Administered 2014-05-01 (×3): 50 ug via INTRAVENOUS

## 2014-05-01 MED ORDER — ACETAMINOPHEN 325 MG PO TABS: 650.0000 mg | ORAL_TABLET | ORAL | Status: DC | PRN

## 2014-05-01 MED ORDER — LACTATED RINGERS IV SOLN
INTRAVENOUS | Status: DC | PRN
  Administered 2014-05-01 (×2): via INTRAVENOUS

## 2014-05-01 MED ORDER — CIPROFLOXACIN HCL 500 MG PO TABS: 500.0000 mg | ORAL_TABLET | Freq: Two times a day (BID) | ORAL | Status: DC

## 2014-05-01 MED ORDER — POTASSIUM CHLORIDE IN NACL 20-0.9 MEQ/L-% IV SOLN
INTRAVENOUS | Status: DC
  Administered 2014-05-01 – 2014-05-02 (×2): via INTRAVENOUS
  Filled 2014-05-01 (×5): qty 1000

## 2014-05-01 MED ORDER — ONDANSETRON HCL 4 MG/2ML IJ SOLN
INTRAMUSCULAR | Status: AC
  Filled 2014-05-01: qty 2

## 2014-05-01 MED ORDER — AMLODIPINE BESY-BENAZEPRIL HCL 10-20 MG PO CAPS: 1.0000 | ORAL_CAPSULE | ORAL | Status: DC

## 2014-05-01 MED ORDER — PROMETHAZINE HCL 25 MG/ML IJ SOLN: 6.2500 mg | INTRAMUSCULAR | Status: DC | PRN

## 2014-05-01 MED ORDER — CIPROFLOXACIN IN D5W 400 MG/200ML IV SOLN
400.0000 mg | Freq: Two times a day (BID) | INTRAVENOUS | Status: DC
  Administered 2014-05-01: 400 mg via INTRAVENOUS

## 2014-05-01 MED ORDER — HYDROCODONE-ACETAMINOPHEN 5-325 MG PO TABS: 1.0000 | ORAL_TABLET | Freq: Four times a day (QID) | ORAL | Status: DC | PRN

## 2014-05-01 MED ORDER — DEXAMETHASONE SODIUM PHOSPHATE 10 MG/ML IJ SOLN
INTRAMUSCULAR | Status: DC | PRN
  Administered 2014-05-01: 10 mg via INTRAVENOUS

## 2014-05-01 MED ORDER — SODIUM CHLORIDE 0.9 % IV BOLUS (SEPSIS)
1000.0000 mL | Freq: Once | INTRAVENOUS | Status: AC
  Administered 2014-05-01: 1000 mL via INTRAVENOUS

## 2014-05-01 MED ORDER — SUCCINYLCHOLINE CHLORIDE 20 MG/ML IJ SOLN
INTRAMUSCULAR | Status: DC | PRN
  Administered 2014-05-01: 100 mg via INTRAVENOUS

## 2014-05-01 MED ORDER — FENTANYL CITRATE 0.05 MG/ML IJ SOLN
INTRAMUSCULAR | Status: AC
  Filled 2014-05-01: qty 2

## 2014-05-01 MED ORDER — AMLODIPINE BESYLATE 10 MG PO TABS
10.0000 mg | ORAL_TABLET | Freq: Every day | ORAL | Status: DC
  Administered 2014-05-02: 10 mg via ORAL
  Filled 2014-05-01 (×2): qty 1

## 2014-05-01 MED ORDER — MEPERIDINE HCL 50 MG/ML IJ SOLN: 6.2500 mg | INTRAMUSCULAR | Status: DC | PRN

## 2014-05-01 MED ORDER — LACTATED RINGERS IV SOLN: INTRAVENOUS | Status: DC

## 2014-05-01 MED ORDER — KETOROLAC TROMETHAMINE 30 MG/ML IJ SOLN
30.0000 mg | Freq: Four times a day (QID) | INTRAMUSCULAR | Status: AC
  Administered 2014-05-01 – 2014-05-02 (×5): 30 mg via INTRAVENOUS
  Filled 2014-05-01 (×4): qty 1
  Filled 2014-05-01: qty 2
  Filled 2014-05-01 (×2): qty 1

## 2014-05-01 MED ORDER — EPHEDRINE SULFATE 50 MG/ML IJ SOLN
INTRAMUSCULAR | Status: DC | PRN
  Administered 2014-05-01 (×2): 5 mg via INTRAVENOUS
  Administered 2014-05-01: 15 mg via INTRAVENOUS

## 2014-05-01 MED ORDER — NEOSTIGMINE METHYLSULFATE 10 MG/10ML IV SOLN
INTRAVENOUS | Status: DC | PRN
  Administered 2014-05-01: 5 mg via INTRAVENOUS

## 2014-05-01 MED ORDER — SODIUM CHLORIDE 0.9 % IJ SOLN
INTRAMUSCULAR | Status: AC
  Filled 2014-05-01: qty 10

## 2014-05-01 MED ORDER — DEXAMETHASONE SODIUM PHOSPHATE 10 MG/ML IJ SOLN
INTRAMUSCULAR | Status: AC
  Filled 2014-05-01: qty 1

## 2014-05-01 MED ORDER — SODIUM CHLORIDE 0.9 % IJ SOLN
INTRAMUSCULAR | Status: AC
  Filled 2014-05-01: qty 20

## 2014-05-01 MED ORDER — LINAGLIPTIN 5 MG PO TABS
5.0000 mg | ORAL_TABLET | Freq: Every day | ORAL | Status: DC
  Administered 2014-05-02: 5 mg via ORAL
  Filled 2014-05-01 (×2): qty 1

## 2014-05-01 MED ORDER — CIPROFLOXACIN IN D5W 400 MG/200ML IV SOLN
INTRAVENOUS | Status: AC
  Filled 2014-05-01: qty 200

## 2014-05-01 MED ORDER — ONDANSETRON HCL 4 MG/2ML IJ SOLN
INTRAMUSCULAR | Status: DC | PRN
  Administered 2014-05-01: 4 mg via INTRAVENOUS

## 2014-05-01 MED ORDER — NITROGLYCERIN 0.4 MG SL SUBL: 0.4000 mg | SUBLINGUAL_TABLET | SUBLINGUAL | Status: DC | PRN

## 2014-05-01 MED ORDER — CLINDAMYCIN PHOSPHATE 900 MG/50ML IV SOLN
INTRAVENOUS | Status: AC
  Filled 2014-05-01: qty 50

## 2014-05-01 MED ORDER — INDOCYANINE GREEN 25 MG IV SOLR
0.4000 mg | INTRAVENOUS | Status: DC | PRN
  Administered 2014-05-01: .4 mg

## 2014-05-01 MED ORDER — STERILE WATER FOR IRRIGATION IR SOLN
Status: DC | PRN
  Administered 2014-05-01: 3000 mL

## 2014-05-01 MED ORDER — ALLOPURINOL 300 MG PO TABS
300.0000 mg | ORAL_TABLET | Freq: Every day | ORAL | Status: DC
  Administered 2014-05-02: 300 mg via ORAL
  Filled 2014-05-01 (×2): qty 1

## 2014-05-01 MED ORDER — ATORVASTATIN CALCIUM 10 MG PO TABS
10.0000 mg | ORAL_TABLET | Freq: Every day | ORAL | Status: DC
  Administered 2014-05-02: 10 mg via ORAL
  Filled 2014-05-01 (×2): qty 1

## 2014-05-01 MED ORDER — FUROSEMIDE 40 MG PO TABS
40.0000 mg | ORAL_TABLET | Freq: Every day | ORAL | Status: DC
  Administered 2014-05-02: 40 mg via ORAL
  Filled 2014-05-01 (×2): qty 1

## 2014-05-01 MED ORDER — BENAZEPRIL HCL 20 MG PO TABS
20.0000 mg | ORAL_TABLET | Freq: Every day | ORAL | Status: DC
  Administered 2014-05-02: 20 mg via ORAL
  Filled 2014-05-01 (×2): qty 1

## 2014-05-01 MED ORDER — GLYCOPYRROLATE 0.2 MG/ML IJ SOLN
INTRAMUSCULAR | Status: AC
  Filled 2014-05-01: qty 4

## 2014-05-01 MED ORDER — PANTOPRAZOLE SODIUM 40 MG PO TBEC
80.0000 mg | DELAYED_RELEASE_TABLET | Freq: Every day | ORAL | Status: DC
  Administered 2014-05-02: 80 mg via ORAL
  Filled 2014-05-01 (×3): qty 2

## 2014-05-01 MED ORDER — CLINDAMYCIN PHOSPHATE 900 MG/50ML IV SOLN
900.0000 mg | Freq: Once | INTRAVENOUS | Status: AC
  Administered 2014-05-01: 900 mg via INTRAVENOUS

## 2014-05-01 MED ORDER — PROPOFOL 10 MG/ML IV BOLUS
INTRAVENOUS | Status: DC | PRN
  Administered 2014-05-01: 170 mg via INTRAVENOUS

## 2014-05-01 MED ORDER — DEXTROSE-NACL 5-0.45 % IV SOLN
INTRAVENOUS | Status: DC
  Administered 2014-05-01: 14:00:00 via INTRAVENOUS

## 2014-05-01 MED ORDER — MIDAZOLAM HCL 5 MG/5ML IJ SOLN
INTRAMUSCULAR | Status: DC | PRN
  Administered 2014-05-01: 2 mg via INTRAVENOUS

## 2014-05-01 MED ORDER — BUPIVACAINE LIPOSOME 1.3 % IJ SUSP
20.0000 mL | Freq: Once | INTRAMUSCULAR | Status: DC
  Filled 2014-05-01: qty 20

## 2014-05-01 MED ORDER — METOCLOPRAMIDE HCL 5 MG/ML IJ SOLN
INTRAMUSCULAR | Status: DC | PRN
  Administered 2014-05-01: 10 mg via INTRAVENOUS

## 2014-05-01 MED ORDER — INSULIN ASPART 100 UNIT/ML ~~LOC~~ SOLN
3.0000 [IU] | Freq: Three times a day (TID) | SUBCUTANEOUS | Status: DC
  Administered 2014-05-01 – 2014-05-03 (×5): 3 [IU] via SUBCUTANEOUS

## 2014-05-01 MED ORDER — LACTATED RINGERS IR SOLN
Status: DC | PRN
  Administered 2014-05-01: 1000 mL

## 2014-05-01 MED ORDER — GLYCOPYRROLATE 0.2 MG/ML IJ SOLN
INTRAMUSCULAR | Status: DC | PRN
  Administered 2014-05-01: .8 mg via INTRAVENOUS

## 2014-05-01 MED ORDER — HYDROCODONE-ACETAMINOPHEN 5-325 MG PO TABS: 1.0000 | ORAL_TABLET | ORAL | Status: DC | PRN

## 2014-05-01 MED ORDER — MORPHINE SULFATE 2 MG/ML IJ SOLN
2.0000 mg | INTRAMUSCULAR | Status: DC | PRN
  Administered 2014-05-01 – 2014-05-03 (×4): 2 mg via INTRAVENOUS
  Filled 2014-05-01 (×5): qty 1

## 2014-05-01 MED ORDER — INSULIN ASPART 100 UNIT/ML ~~LOC~~ SOLN
0.0000 [IU] | Freq: Three times a day (TID) | SUBCUTANEOUS | Status: DC
  Administered 2014-05-01: 3 [IU] via SUBCUTANEOUS
  Administered 2014-05-02: 2 [IU] via SUBCUTANEOUS

## 2014-05-01 MED ORDER — FENTANYL CITRATE 0.05 MG/ML IJ SOLN
INTRAMUSCULAR | Status: AC
  Filled 2014-05-01: qty 5

## 2014-05-01 MED ORDER — METOCLOPRAMIDE HCL 5 MG/ML IJ SOLN
INTRAMUSCULAR | Status: AC
  Filled 2014-05-01: qty 2

## 2014-05-01 MED ORDER — NEOSTIGMINE METHYLSULFATE 10 MG/10ML IV SOLN
INTRAVENOUS | Status: AC
  Filled 2014-05-01: qty 1

## 2014-05-01 MED ORDER — CISATRACURIUM BESYLATE 20 MG/10ML IV SOLN
INTRAVENOUS | Status: AC
  Filled 2014-05-01: qty 10

## 2014-05-01 MED ORDER — PHENYLEPHRINE 40 MCG/ML (10ML) SYRINGE FOR IV PUSH (FOR BLOOD PRESSURE SUPPORT)
PREFILLED_SYRINGE | INTRAVENOUS | Status: AC
  Filled 2014-05-01: qty 10

## 2014-05-01 MED ORDER — EPHEDRINE SULFATE 50 MG/ML IJ SOLN
INTRAMUSCULAR | Status: AC
  Filled 2014-05-01: qty 1

## 2014-05-01 MED ORDER — PHENYLEPHRINE HCL 10 MG/ML IJ SOLN
INTRAMUSCULAR | Status: DC | PRN
  Administered 2014-05-01 (×2): 80 ug via INTRAVENOUS
  Administered 2014-05-01: 40 ug via INTRAVENOUS
  Administered 2014-05-01: 120 ug via INTRAVENOUS

## 2014-05-01 MED ORDER — CISATRACURIUM BESYLATE (PF) 10 MG/5ML IV SOLN
INTRAVENOUS | Status: DC | PRN
  Administered 2014-05-01: 4 mg via INTRAVENOUS
  Administered 2014-05-01 (×2): 2 mg via INTRAVENOUS
  Administered 2014-05-01: 8 mg via INTRAVENOUS
  Administered 2014-05-01: 1 mg via INTRAVENOUS

## 2014-05-01 MED ORDER — FENTANYL CITRATE 0.05 MG/ML IJ SOLN
INTRAMUSCULAR | Status: DC | PRN
  Administered 2014-05-01: 50 ug via INTRAVENOUS
  Administered 2014-05-01: 100 ug via INTRAVENOUS
  Administered 2014-05-01: 50 ug via INTRAVENOUS

## 2014-05-01 MED ORDER — PROPOFOL 10 MG/ML IV BOLUS
INTRAVENOUS | Status: AC
  Filled 2014-05-01: qty 20

## 2014-05-01 MED ORDER — SODIUM CHLORIDE 0.9 % IV SOLN
INTRAVENOUS | Status: DC | PRN
  Administered 2014-05-01: 12:00:00 via INTRAVENOUS

## 2014-05-01 SURGICAL SUPPLY — 69 items
CABLE HIGH FREQUENCY MONO STRZ (ELECTRODE) ×4 IMPLANT
CANISTER SUCTION 2500CC (MISCELLANEOUS) IMPLANT
CATH FOLEY 2WAY SLVR 18FR 30CC (CATHETERS) ×4 IMPLANT
CATH TIEMANN FOLEY 18FR 5CC (CATHETERS) ×4 IMPLANT
CHLORAPREP W/TINT 26ML (MISCELLANEOUS) ×4 IMPLANT
CLIP LIGATING HEM O LOK PURPLE (MISCELLANEOUS) ×16 IMPLANT
CLIP LIGATING HEMO LOK XL GOLD (MISCELLANEOUS) IMPLANT
CLIP LIGATING HEMO O LOK GREEN (MISCELLANEOUS) ×8 IMPLANT
CLOTH BEACON ORANGE TIMEOUT ST (SAFETY) ×4 IMPLANT
CONT SPEC 4OZ CLIKSEAL STRL BL (MISCELLANEOUS) ×4 IMPLANT
COVER SURGICAL LIGHT HANDLE (MISCELLANEOUS) ×4 IMPLANT
COVER TIP SHEARS 8 DVNC (MISCELLANEOUS) ×2 IMPLANT
COVER TIP SHEARS 8MM DA VINCI (MISCELLANEOUS) ×2
CUTTER ECHEON FLEX ENDO 45 340 (ENDOMECHANICALS) ×4 IMPLANT
DECANTER SPIKE VIAL GLASS SM (MISCELLANEOUS) ×8 IMPLANT
DERMABOND ADVANCED (GAUZE/BANDAGES/DRESSINGS) ×2
DERMABOND ADVANCED .7 DNX12 (GAUZE/BANDAGES/DRESSINGS) ×2 IMPLANT
DRAPE ARM DVNC X/XI (DISPOSABLE) ×8 IMPLANT
DRAPE COLUMN DVNC XI (DISPOSABLE) ×2 IMPLANT
DRAPE DA VINCI XI ARM (DISPOSABLE) ×8
DRAPE DA VINCI XI COLUMN (DISPOSABLE) ×2
DRAPE SURG IRRIG POUCH 19X23 (DRAPES) ×4 IMPLANT
DRAPE WARM FLUID 44X44 (DRAPE) ×4 IMPLANT
DRSG TEGADERM 4X4.75 (GAUZE/BANDAGES/DRESSINGS) ×8 IMPLANT
DRSG TEGADERM 6X8 (GAUZE/BANDAGES/DRESSINGS) ×8 IMPLANT
ELECT REM PT RETURN 9FT ADLT (ELECTROSURGICAL) ×4
ELECTRODE REM PT RTRN 9FT ADLT (ELECTROSURGICAL) ×2 IMPLANT
GAUZE SPONGE 2X2 8PLY STRL LF (GAUZE/BANDAGES/DRESSINGS) ×2 IMPLANT
GLOVE BIO SURGEON STRL SZ 6.5 (GLOVE) ×3 IMPLANT
GLOVE BIO SURGEONS STRL SZ 6.5 (GLOVE) ×1
GLOVE BIOGEL M STRL SZ7.5 (GLOVE) ×8 IMPLANT
GLOVE BIOGEL PI IND STRL 7.0 (GLOVE) ×2 IMPLANT
GLOVE BIOGEL PI IND STRL 7.5 (GLOVE) ×2 IMPLANT
GLOVE BIOGEL PI INDICATOR 7.0 (GLOVE) ×2
GLOVE BIOGEL PI INDICATOR 7.5 (GLOVE) ×2
GLOVE SS BIOGEL STRL SZ 7 (GLOVE) ×4 IMPLANT
GLOVE SUPERSENSE BIOGEL SZ 7 (GLOVE) ×4
GLOVE SURG SS PI 6.5 STRL IVOR (GLOVE) ×4 IMPLANT
GLOVE SURG SS PI 7.0 STRL IVOR (GLOVE) ×4 IMPLANT
GOWN STRL REUS W/TWL LRG LVL3 (GOWN DISPOSABLE) ×12 IMPLANT
GOWN STRL REUS W/TWL LRG LVL4 (GOWN DISPOSABLE) ×12 IMPLANT
HOLDER FOLEY CATH W/STRAP (MISCELLANEOUS) ×4 IMPLANT
IV LACTATED RINGERS 1000ML (IV SOLUTION) ×4 IMPLANT
KIT ACCESSORY DA VINCI DISP (KITS) ×2
KIT ACCESSORY DVNC DISP (KITS) ×2 IMPLANT
KIT PROCEDURE DA VINCI SI (MISCELLANEOUS) ×2
KIT PROCEDURE DVNC SI (MISCELLANEOUS) ×2 IMPLANT
MANIFOLD NEPTUNE II (INSTRUMENTS) ×4 IMPLANT
NEEDLE INSUFFLATION 14GA 120MM (NEEDLE) ×4 IMPLANT
NEEDLE SPNL 22GX7 SPINOC (NEEDLE) ×4 IMPLANT
PACK ROBOT UROLOGY CUSTOM (CUSTOM PROCEDURE TRAY) ×4 IMPLANT
RELOAD GREEN ECHELON 45 (STAPLE) ×4 IMPLANT
SEAL CANN UNIV 5-8 DVNC XI (MISCELLANEOUS) ×8 IMPLANT
SEAL XI 5MM-8MM UNIVERSAL (MISCELLANEOUS) ×8
SET IRRIG TUBING LAPAROSCOPIC (IRRIGATION / IRRIGATOR) IMPLANT
SET TUBE IRRIG SUCTION NO TIP (IRRIGATION / IRRIGATOR) ×4 IMPLANT
SOLUTION ELECTROLUBE (MISCELLANEOUS) ×4 IMPLANT
SPONGE GAUZE 2X2 STER 10/PKG (GAUZE/BANDAGES/DRESSINGS) ×2
SPONGE LAP 4X18 X RAY DECT (DISPOSABLE) ×4 IMPLANT
SUT ETHILON 3 0 PS 1 (SUTURE) ×4 IMPLANT
SUT MNCRL AB 4-0 PS2 18 (SUTURE) ×8 IMPLANT
SUT PDS AB 1 CT1 27 (SUTURE) ×12 IMPLANT
SUT VLOC BARB 180 ABS3/0GR12 (SUTURE) ×12
SUTURE VLOC BRB 180 ABS3/0GR12 (SUTURE) ×6 IMPLANT
SYR 27GX1/2 1ML LL SAFETY (SYRINGE) ×4 IMPLANT
TOWEL OR 17X26 10 PK STRL BLUE (TOWEL DISPOSABLE) ×4 IMPLANT
TOWEL OR NON WOVEN STRL DISP B (DISPOSABLE) ×4 IMPLANT
TROCAR 12M 150ML BLUNT (TROCAR) ×4 IMPLANT
WATER STERILE IRR 1500ML POUR (IV SOLUTION) ×8 IMPLANT

## 2014-05-01 NOTE — Discharge Instructions (Signed)

## 2014-05-01 NOTE — Progress Notes (Signed)
Hgb. And Hct. Drawn by lab. 

## 2014-05-01 NOTE — Anesthesia Preprocedure Evaluation (Addendum)
Anesthesia Evaluation  Patient identified by MRN, date of birth, ID band Patient awake    Reviewed: Allergy & Precautions, H&P , NPO status , Patient's Chart, lab work & pertinent test results  Airway Mallampati: I TM Distance: >3 FB Neck ROM: Full    Dental no notable dental hx.    Pulmonary sleep apnea and Continuous Positive Airway Pressure Ventilation ,  breath sounds clear to auscultation  Pulmonary exam normal       Cardiovascular hypertension, Pt. on medications + CAD (mimimal by Cath) Rhythm:Regular Rate:Normal     Neuro/Psych negative neurological ROS  negative psych ROS   GI/Hepatic Neg liver ROS, hiatal hernia, GERD-  Medicated and Controlled,  Endo/Other  diabetes, Type 2, Oral Hypoglycemic AgentsMorbid obesity  Renal/GU negative Renal ROS  negative genitourinary   Musculoskeletal negative musculoskeletal ROS (+)   Abdominal   Peds negative pediatric ROS (+)  Hematology negative hematology ROS (+)   Anesthesia Other Findings   Reproductive/Obstetrics negative OB ROS                          Anesthesia Physical Anesthesia Plan  ASA: III  Anesthesia Plan: General   Post-op Pain Management:    Induction: Intravenous  Airway Management Planned: Oral ETT  Additional Equipment:   Intra-op Plan:   Post-operative Plan: Extubation in OR  Informed Consent: I have reviewed the patients History and Physical, chart, labs and discussed the procedure including the risks, benefits and alternatives for the proposed anesthesia with the patient or authorized representative who has indicated his/her understanding and acceptance.   Dental advisory given  Plan Discussed with: CRNA  Anesthesia Plan Comments:         Anesthesia Quick Evaluation

## 2014-05-01 NOTE — Anesthesia Postprocedure Evaluation (Signed)
  Anesthesia Post-op Note  Patient: Leonard [name]  Procedure(s) Performed: Procedure(s) (LRB): ROBOTIC ASSISTED LAPAROSCOPIC RADICAL PROSTATECTOMY WITH INDOCYANINE GREEN DYE (N/A) LYMPHADENECTOMY (Bilateral)  Patient Location: PACU  Anesthesia Type: General  Level of Consciousness: awake and alert   Airway and Oxygen Therapy: Patient Spontanous Breathing  Post-op Pain: mild  Post-op Assessment: Post-op Vital signs reviewed, Patient's Cardiovascular Status Stable, Respiratory Function Stable, Patent Airway and No signs of Nausea or vomiting  Last Vitals:  Filed Vitals:   05/01/14 1434  BP: 133/65  Pulse: 84  Temp: 36.3 C  Resp: 14    Post-op Vital Signs: stable   Complications: No apparent anesthesia complications

## 2014-05-01 NOTE — Op Note (Signed)
NAMEMarland Kitchen  AN, SCHNABEL NO.:  [id number]  MEDICAL RECORD NO.:  [id number]  LOCATION:  [facility]                         FACILITY:  [facility]  PHYSICIAN:  Alexis Frock, MD     DATE OF BIRTH:  11-26-1945  DATE OF PROCEDURE: 05/01/2014 DATE OF DISCHARGE:                              OPERATIVE REPORT   DIAGNOSIS:  High risk prostate cancer.  PROCEDURES: 1. Robotic-assisted laparoscopic radical prostatectomy. 2. Bilateral pelvic lymphadenectomy. 3. Injection of ICG dye for lymphangiography.  ASSISTANT:  Leta Baptist, PA  ESTIMATED BLOOD LOSS:  100 mL.  COMPLICATIONS:  None.  SPECIMENS: 1. Radical prostatectomy. 2. Periprostatic fat. 3. Right external iliac lymph nodes. 4. Right obturator lymph nodes. 5. Left external iliac lymph node, sentinel. 6. Left obturator lymph node, sentinel.  FINDINGS: 1. Copious subcutaneous fat. 2. Hyperfluorescent lymph nodes corresponding to left external and     left obturator packets.  DRAINS: 1. Jackson-Pratt drain to bulb suction. 2. Foley catheter to straight drain.  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  He underwent preoperative staging, imaging that revealed no evidence of local advance or distant disease.  Options were discussed including ablated therapies versus hormone manipulation versus surveillance versus surgery.  He adamantly wished to proceed with surgery.  We had previously discussed the fact that his significant obesity with increased risk of all perioperative complications.  He understood this and wished to proceed.  Informed consent was obtained and placed in medical record.  PROCEDURE IN DETAIL:  The patient being, Leonard [name], was verified. Procedure being robotic prostatectomy was confirmed.  Procedure was carried out.  Time-out was performed.  Intravenous antibiotics  were administered.  General endotracheal anesthesia was introduced.  The patient was placed into a supine position.  He was further fashioned on the operative table using 3-inch tape over his chest cross patting.  His arms were tucked.  Sequential pressure devices were applied.  He was placed in a steep Trendelenburg position, found to be suitably positioned.  A sterile field was created by first clipper shaving and then prepping and draping his infraxiphoid abdomen, penis, perineum, and proximal thighs.  Next, a high-flow low pressure pneumoperitoneum was obtained using Veress technique in the supraumbilical midline having passed the aspiration and drop test.  An 8-mm robotic camera port was placed into the same location.  We purposely placed the ports approximately 4 cm higher than traditional prostatectomy given the patient's large pannus and obesity, and his upper abdomen had less subcutaneous fat.  Laparoscopic examination of peritoneal cavity revealed no significant adhesions and no visceral injury.  Additional ports were then placed as follows; right paramedian 8-mm robotic port, right far lateral 12 mm assist port, right paramedian 5 mm suction port, left paramedian 8 mm robotic port, left far lateral 8 mm robotic port. Robot was docked and passed through electronic checks.  Initial attention was directed to the development of space of Retzius.  Incision was made lateral to the left medial umbilical ligament from the area of the midline towards the area of the  internal ring and following the course of the iliac vessels.  The bladder was swept away from the pelvic sidewall towards the area of the endopelvic fascia.  A mirror image dissection was performed on the right side.  Notably, the vas deferens were purposely cauterized and ligated as it entered the internal ring, to be used as a bucket handle.  The anterior attachments were taken down using cautery scissors which completely  dropped the bladder and exposed the anterior base of the prostate, and this area was defatted.  It was better delineating the bladder neck and prostate junction.  This fat was set aside, labeled with periprostatic fat.  Next, 0.2 mL of indocyanine green dye was injected using percutaneous robotic guidance technique into the right and left lobes of the prostate respectively.  Great care was taken to avoid spillage.  This did not occur.  Next, the endopelvic fascia was carefully swept away from the lateral aspect of the prostate in a base to apex orientation.  This exposed the dorsal venous complex which was controlled using vascular load stapler.  There had been approximately 15 minutes post-dye injection, and the pelvis was interrogated with near-infrared fluorescence light.  Near infrared fluorescent light sentinel lymphangiography with multiple lymphatic channels were coursing over the prostate.  There were no hyperfluorescent lymph nodes in the right hemipelvis.  On the left side, there were several hyperfluorescent lymph nodes in the left external and left obturator groups respectively.  Attention was then directed to lymphadenectomy.  First on the right side, all fiber fatty tissue in the confines of the right external iliac artery and vein, pelvic side wall were carefully immobilized.  Hemostasis was achieved with cold clips. This set aside, labeled right external iliac lymph nodes.  Next, all fiber fatty tissue in the confines of the right external iliac vein, pelvic side wall, obturator nerve were dissected free.  Lymphostasis was achieved with cold clips.  This set aside, labeled right obturator lymph nodes.  The obturator nerve was inspected following these maneuvers and found to be uninjured.  Next, a mirror image lymphadenectomy was performed on the left side with these exact same boundaries.  However, these were labeled sentinel.  There were hyperfluorescent lymph nodes  in these locations.  Again, the obturator nerve was inspected and found to be uninjured on the left side.  Lymphostasis was achieved with cold clips.  Next, the bladder neck was identified removing the Foley catheter back and forth and dissection proceeded in the anterior- posterior direction separating the bladder neck from the base of the prostate taking great care to avoid excessive bladder neck caliber, and this did not occur.  The median lobe was identified, posterior dissection was performed by incising approximately 7 mm inferior posterior to the posterior lip of the bladder neck and dissecting directly posteriorly.  The plane of the non-rugae was entered. Bilateral vas deferens were encountered, dissected for distance of 4 cm, ligated and placed on gentle superior traction.  Bilateral seminal vesicles were dissected towards the tip and also placed on gentle superior traction.  These were quite large.  Additional posterior dissection was performed in a base to apex orientation which exposed the vascular pedicles bilaterally.  We previously discussed the patient given his high risk disease that minimal nerve sparing would be performed, and given the very thick pedicles, decided to proceed with this plan of non-reassuring approach.  First on the left side, sequential clipping was performed of the neurovascular pedicle in base to apex  orientation as was the right side using a sequential clipping technique.  Entry dissection was then performed.  The membranous urethra was identified and coldly transected as was the posterior urethral plate, and this completely freed up the radical prostatectomy.  Specimen was placed into an EndoCatch bag for later retrieval.  Inspection of the pelvis including the rectum revealed no evidence of visceral injury. Pelvis was irrigated and rectally placed Foley catheter was filled with several pumps of air, with a Toomey syringe, no violation was  seen. Posterior reconstruction was performed using a single V-Loc suture bringing the posterior urethral plate and posterior bladder neck into tension-free apposition.  Next, mucosa-to-mucosa anastomosis was performed using double-armed V-Loc suture from the 6 o'clock to the 12 o'clock position, and anchoring this, a 12 o'clock positioned superior resect ligaments.  New Foley catheter was placed per urethra, straight drain and this irrigated quantitatively and no gross leak was identified.  Following these maneuvers, all sponge, needle counts were correct.  Hemostasis appeared excellent.  There was no obvious visceral injury.  A closed suction drain was brought through the previous left lateral most robotic port site near the peritoneal cavity.  Robot was undocked.  Specimen was retrieved by extending the previous camera port site inferiorly for total distance approximately 5 cm and removing the radical prostatectomy specimen and setting it aside for permanent pathology.  The specimen was closed at the level of fascia using figure- of-eight PDS x4, and at the level of Scarpa's using subcuticular repair using a running Vicryl.  All incisions were infiltrated with dilute lipolyzed Marcaine and closed at the level of skin using subcuticular Monocryl followed by Dermabond.  Procedure was then terminated.  The patient tolerated the procedure well.  There were no immediate periprocedural complications.  The patient was taken to postanesthesia care unit in stable condition.          ______________________________ Alexis Frock, MD     TM/MEDQ  D:  05/01/2014  T:  05/01/2014  Job:  [id number]

## 2014-05-01 NOTE — Transfer of Care (Signed)
Immediate Anesthesia Transfer of Care Note  Patient: Leonard [name]  Procedure(s) Performed: Procedure(s): ROBOTIC ASSISTED LAPAROSCOPIC RADICAL PROSTATECTOMY WITH INDOCYANINE GREEN DYE (N/A) LYMPHADENECTOMY (Bilateral)  Patient Location: PACU  Anesthesia Type:General  Level of Consciousness: awake, alert , oriented and patient cooperative  Airway & Oxygen Therapy: Patient Spontanous Breathing and Patient connected to face mask oxygen  Post-op Assessment: Report given to PACU RN and Post -op Vital signs reviewed and stable  Post vital signs: stable  Complications: No apparent anesthesia complications

## 2014-05-01 NOTE — Progress Notes (Signed)
Patient has attempted three times since surgery to ambulate but has had difficulty with all three.  First two prior to my shift patient was dizzy and unable to do anything but stand.  After attempting to ambulate patient, patient sat on the edge of the bed and was really dizzy and stated he needed to lay down.  Informed patient that we needed to encourage ambulation once the dizziness had resolved and patient was agreeable. Will continue to monitor and ambulate this evening if dizziness improves.

## 2014-05-01 NOTE — H&P (Signed)
Leonard [name] is an 68 y.o. male.    Chief Complaint: Pre-Op Robotic Prostatectomy  HPI:   1 - High-Risk Prostate Cancer - Pt wtih Gleaston 4+4=8 LMB and 4+3=7 LLB by biopsy 01/2014 on evaluaiton for PSA 4.4 in setting of hypogonadism. CT and Bone scan w/o locall advanced or distant disease. He weights >280lb. Vol 41mL with NO median lobe by TRUS.  2 - Erectile Dysfunction - Uses PDE5i with satisfaction (Cialis) for progressive ED.  3 - Hypogonadism - previously on androgel for low T and low libido, now held given prostate cancer.   4 - Lower Urinary Tract Symptoms - Pt on tamsulosin at baseline for moderate obstructive symptoms. No incontinence. TRUS 8mL prostate.  PMH sig for obesity, DM2, OSA (follows Dr Cleda Mccreedy, pulm), Fe deficiency anemia (now improved, Dr. Earlie Server), atypical chest pain (minimal CAD by Cath 2007, folllows Dorris Carnes MD)  Today Leonard [name] is seen to proceed with robotic radical prostatectomy.  He has had interval cardiac clearance.  Past Medical History  Diagnosis Date  . Type II or unspecified type diabetes mellitus without mention of complication, not stated as uncontrolled   . Gouty arthropathy   . Other and unspecified hyperlipidemia   . Unspecified essential hypertension   . OSA (obstructive sleep apnea)     USES CPAP MACHINE   . Diverticulosis of colon (without mention of hemorrhage) 09/05/2006    Colonoscopy   . Hx of colonic polyps 09/05/2006    Colonoscopy(ADENOMATOUS POLYP)  . Hiatal hernia 09/05/2006    EGD  . 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 GERD (gastroesophageal reflux disease) 07/26/2001    EGD(Chronic)  . Hemorrhoids   . Coronary artery disease   . Cancer     PROSTATE CANCER  . Iron deficiency anemia, unspecified     Past Surgical History  Procedure Laterality Date  . 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 Hemorrhoid surgery      Family History  Problem Relation Age  of Onset  . Heart attack Father   . Diabetes Mother   . Diabetes Brother    Social History:  reports that he has never smoked. He has never used smokeless tobacco. He reports that he does not drink alcohol or use illicit drugs.  Allergies:  Allergies  Allergen Reactions  . Dicloxacillin Rash  . Cayenne Pepper [Cayenne] Swelling    No prescriptions prior to admission    No results found for this or any previous visit (from the past 48 hour(s)). No results found.  Review of Systems  Constitutional: Negative.  Negative for fever and chills.  HENT: Negative.   Eyes: Negative.   Respiratory: Negative.   Cardiovascular: Negative.   Gastrointestinal: Negative.   Genitourinary: Negative.   Musculoskeletal: Negative.   Skin: Negative.   Neurological: Negative.   Endo/Heme/Allergies: Negative.   Psychiatric/Behavioral: Negative.     There were no vitals taken for this visit. Physical Exam  Constitutional: He is oriented to person, place, and time. He appears well-developed and well-nourished.  HENT:  Head: Normocephalic and atraumatic.  Eyes: EOM are normal. Pupils are equal, round, and reactive to light.  Neck: Normal range of motion. Neck supple.  Cardiovascular: Normal rate.   Respiratory: Effort normal.  GI: Soft. Bowel sounds are normal.  Severe truncal obesity  Genitourinary: Penis normal.  Musculoskeletal: Normal range of motion.  Neurological: He is alert and oriented to person,  place, and time.  Skin: Skin is warm and dry.  Psychiatric: He has a normal mood and affect. His behavior is normal. Judgment and thought content normal.     Assessment/Plan  1 - High-Risk Prostate Cancer - Difficult situation with significant cancer in very obese man with comorbidity, but ceratinly >54yr life expectancy. HIs body habitus and relatively large prostate makes ANY form of treatment more challenging and risky. He and his wife adamantly want to proceed with surgery. I told them  that if I felt there was difficulty beyond reasonable margin of safety with initial laparoscopic access and mobilization (likely most difficult part with his size) that we would abort procedure and he respects this.   We rediscussed prostatectomy and specifically robotic prostatectomy with bilateral pelvic lymphadenectomy being the technique that I most commonly perform. I showed the patient on their abdomen the approximately 6 small incision (trocar) sites as well as presumed extraction sites with robotic approach as well as possible open incision sites should open conversion be necessary. We rediscussed peri-operative risks including bleeding, infection, deep vein thrombosis, pulmonary embolism, compartment syndrome, nuropathy / neuropraxia, heart attack, stroke, death, as well as long-term risks such as non-cure / need for additional therapy. We specifically readdressed that the procedure would compromise urinary control leading to stress incontinence which typically resolves with time and pelvic rehabilitation (Kegel's, etc..), but can sometimes be permanent and require additional therapy including surgery. We also specifically readdressed sexual sequellae including significant erectile dysfunction which typically partially resolves with time but can also be permanent and require additional therapy including surgery.   We rediscussed the typical hospital course including usual 1-2 night hospitalization, discharge with foley catheter in place usually for 1-2 weeks before voiding trial as well as usually 2 week recovery until able to perform most non-strenuous activity and 6 weeks until able to return to most jobs and more strenuous activity such as exercise.    2 - Erectile Dysfunction - explained very frankly that this would become worse with surgery, that that given left lateral positivity, left sided nerve spating would be performed judiciously if at all pending intra-operative findings.   3 -  Hypogonadism - agree with holding in light of tumor as per above.  4 - Lower Urinary Tract Symptoms - continue tamsulosin pre-op, will DC post-op  Leonard [name], Leonard [name] 05/01/2014, 6:14 AM

## 2014-05-01 NOTE — Progress Notes (Signed)
Hgb. 11.7- Hct. 35.9- Results noted.

## 2014-05-01 NOTE — Brief Op Note (Signed)
05/01/2014  12:35 PM  PATIENT:  Leonard [name]  68 y.o. male  PRE-OPERATIVE DIAGNOSIS:  HIGH RISK PROSTATE CANCER  POST-OPERATIVE DIAGNOSIS:  HIGH RISK PROSTATE CANCER  PROCEDURE:  Procedure(s): ROBOTIC ASSISTED LAPAROSCOPIC RADICAL PROSTATECTOMY WITH INDOCYANINE GREEN DYE (N/A) LYMPHADENECTOMY (Bilateral)  SURGEON:  Surgeon(s) and Role:    * Alexis Frock, MD - Primary  PHYSICIAN ASSISTANT:   ASSISTANTS: Felipa Furnace, PA   ANESTHESIA:   local and general  EBL:  Total I/O In: 1000 [I.V.:1000] Out: 100 [Blood:100]  BLOOD ADMINISTERED:none  DRAINS: Gastrostomy Tube   LOCAL MEDICATIONS USED:  MARCAINE     SPECIMEN:  Source of Specimen:  prostatecomy, pelvic lymph nodes  DISPOSITION OF SPECIMEN:  PATHOLOGY  COUNTS:  YES  TOURNIQUET:  * No tourniquets in log *  DICTATION: .Other Dictation: Dictation Number [id number]  PLAN OF CARE: Admit to inpatient   PATIENT DISPOSITION:  PACU - hemodynamically stable.   Delay start of Pharmacological VTE agent (>24hrs) due to surgical blood loss or risk of bleeding: yes

## 2014-05-02 ENCOUNTER — Encounter (HOSPITAL_COMMUNITY): Payer: Self-pay | Admitting: Urology

## 2014-05-02 LAB — BASIC METABOLIC PANEL
BUN: 11 mg/dL (ref 6–23)
CALCIUM: 8.6 mg/dL (ref 8.4–10.5)
CO2: 23 meq/L (ref 19–32)
Chloride: 102 mEq/L (ref 96–112)
Creatinine, Ser: 0.99 mg/dL (ref 0.50–1.35)
GFR calc Af Amer: >90 mL/min (ref >90)
GFR calc non Af Amer: 83 mL/min — ABNORMAL LOW (ref >90)
Glucose, Bld: 118 mg/dL — ABNORMAL HIGH (ref 70–99)
Potassium: 4.1 mEq/L (ref 3.7–5.3)
SODIUM: 136 meq/L — AB (ref 137–147)

## 2014-05-02 LAB — GLUCOSE, CAPILLARY
Glucose-Capillary: 105 mg/dL — ABNORMAL HIGH (ref 70–99)
Glucose-Capillary: 121 mg/dL — ABNORMAL HIGH (ref 70–99)
Glucose-Capillary: 105 mg/dL — ABNORMAL HIGH (ref 70–99)
Glucose-Capillary: 148 mg/dL — ABNORMAL HIGH (ref 70–99)

## 2014-05-02 LAB — CREATININE, FLUID (PLEURAL, PERITONEAL, JP DRAINAGE): CREAT FL: 1 mg/dL

## 2014-05-02 LAB — HEMOGLOBIN AND HEMATOCRIT, BLOOD
HEMATOCRIT: 37 % — AB (ref 39.0–52.0)
Hemoglobin: 12.3 g/dL — ABNORMAL LOW (ref 13.0–17.0)

## 2014-05-02 NOTE — Progress Notes (Signed)
Patient stated that he would put the CPAP on when he was ready for bed due to having company.  RT advised the patient that if he had any issues or needed any assistance he could call respiratory.  RT will continue to monitor.

## 2014-05-02 NOTE — Progress Notes (Signed)
Placed patient on auto titrate with large full face mask.  Patient is tolerating well.

## 2014-05-02 NOTE — Progress Notes (Signed)
1 Day Post-Op  Subjective:  1 - High-Risk Prostate Cancer - s/p robotic radical prostattecotmy with ICG sentinel + template pelvic lymphadenecotmy 05/01/2014 for Gleaston 4+4=8 LMB and 4+3=7 LLB by biopsy 01/2014 on evaluaiton for PSA 4.4.   Today Leonard [name] is progressing well. Ambulating, tolerating PO diet, pain managed fairly well on prn's. No tube / drain / incision issues.   Objective: Vital signs in last 24 hours: Temp:  [97.4 F (36.3 C)-98.1 F (36.7 C)] 98 F (36.7 C) (06/18 0702) Pulse Rate:  [80-92] 80 (06/18 0702) Resp:  [14-19] 18 (06/18 0702) BP: (133-171)/(65-81) 140/68 mmHg (06/18 0702) SpO2:  [93 %-100 %] 95 % (06/18 0702) Weight:  [136.9 kg (301 lb 13 oz)] 136.9 kg (301 lb 13 oz) (06/17 1634)    Intake/Output from previous day: 06/17 0701 - 06/18 0700 In: 2425 [P.O.:120; I.V.:1305; IV Piggyback:1000] Out: 2285 [Urine:2050; Drains:135; Blood:100] Intake/Output this shift: Total I/O In: -  Out: 275 [Urine:200; Drains:75]  General appearance: alert, cooperative and appears stated age Head: Normocephalic, without obvious abnormality, atraumatic Throat: lips, mucosa, and tongue normal; teeth and gums normal Neck: supple, symmetrical, trachea midline Back: symmetric, no curvature. ROM normal. No CVA tenderness. Resp: Non-labored Chest wall: no tenderness Cardio: Nl rate GI: soft, non-tender; bowel sounds normal; no masses,  no organomegaly Male genitalia: normal, foley c/d/i with light pink urine.  Extremities: extremities normal, atraumatic, no cyanosis or edema Skin: Skin color, texture, turgor normal. No rashes or lesions Lymph nodes: Cervical, supraclavicular, and axillary nodes normal. Neurologic: Grossly normal Incision/Wound: Recent port and extraction sites c/d/i, no drainage or hernieas. JP with serosanguinious output.   Lab Results:   Recent Labs  05/01/14 1303 05/02/14 0449  HGB 11.7* 12.3*  HCT 35.9* 37.0*   BMET  Recent Labs  05/02/14 0449   NA 136*  K 4.1  CL 102  CO2 23  GLUCOSE 118*  BUN 11  CREATININE 0.99  CALCIUM 8.6   PT/INR No results found for this basename: LABPROT, INR,  in the last 72 hours ABG No results found for this basename: PHART, PCO2, PO2, HCO3,  in the last 72 hours  Studies/Results: No results found.  Anti-infectives: Anti-infectives   Start     Dose/Rate Route Frequency Ordered Stop   05/01/14 0715  clindamycin (CLEOCIN) IVPB 900 mg     900 mg 100 mL/hr over 30 Minutes Intravenous  Once 05/01/14 0708 05/01/14 0851   05/01/14 0708  ciprofloxacin (CIPRO) IVPB 400 mg  Status:  Discontinued     400 mg 200 mL/hr over 60 Minutes Intravenous Every 12 hours 05/01/14 0708 05/01/14 1445   05/01/14 0000  ciprofloxacin (CIPRO) 500 MG tablet     500 mg Oral 2 times daily 05/01/14 1239        Assessment/Plan:  1 - High-Risk Prostate Cancer - Doing well POD 1. Continue ambulation, IS. Plan for likely DC tomorrow AM given current progress.  Check JP Cr today.   [facility], [place] 05/02/2014

## 2014-05-03 LAB — GLUCOSE, CAPILLARY: Glucose-Capillary: 116 mg/dL — ABNORMAL HIGH (ref 70–99)

## 2014-05-03 MED ORDER — SENNOSIDES-DOCUSATE SODIUM 8.6-50 MG PO TABS: 1.0000 | ORAL_TABLET | Freq: Two times a day (BID) | ORAL | Status: DC

## 2014-05-03 NOTE — Discharge Summary (Signed)
Physician Discharge Summary  Patient ID: Leonard [name] MRN: [id number] DOB/AGE: 12/03/1945 68 y.o.  Admit date: 05/01/2014 Discharge date: 05/03/2014  Admission Diagnoses: High-Risk Prostate Cancer  Discharge Diagnoses:  High-Risk Prostate Cancer Active Problems:   Malignant neoplasm of prostate   Discharged Condition: good  Hospital Course:   1 - High-Risk Prostate Cancer - s/p robotic radical prostattecotmy with ICG sentinel + template pelvic lymphadenecotmy 05/01/2014 for pT2c Gleaston 4+4=8 / tertiary 10 adenocarcinoma with negative margins and nodes. Post-op he was admitted to the 4th floor Urology service where he began his vigorous recovery. By 6/19, the day of discharge, he is ambulatory, pain controlled with PO meds, tollerating regular diet, JP drain removed as fluid Cr 1.0 (same as serum).    Consults: None  Significant Diagnostic Studies: labs: JP Cr 1.0, Final pathology as per above  Treatments: surgery:  robotic radical prostattecotmy with ICG sentinel + template pelvic lymphadenecotmy 05/01/2014   Discharge Exam: Blood pressure 150/76, pulse 84, temperature 98.1 F (36.7 C), temperature source Oral, resp. rate 20, height 5\' 9"  (1.753 m), weight 136.9 kg (301 lb 13 oz), SpO2 96.00%. General appearance: alert, cooperative and appears stated age Head: Normocephalic, without obvious abnormality, atraumatic Nose: Nares normal. Septum midline. Mucosa normal. No drainage or sinus tenderness. Throat: lips, mucosa, and tongue normal; teeth and gums normal Neck: supple, symmetrical, trachea midline Back: symmetric, no curvature. ROM normal. No CVA tenderness. Resp: Non-labored Chest wall: no tenderness Cardio: regular rate and rhythm, S1, S2 normal, no murmur, click, rub or gallop GI: soft, non-tender; bowel sounds normal; no masses,  no organomegaly Male genitalia: normal, foley c/d/i with yellow urine.  Extremities: extremities normal, atraumatic, no cyanosis or  edema Pulses: 2+ and symmetric Skin: Skin color, texture, turgor normal. No rashes or lesions Lymph nodes: Cervical, supraclavicular, and axillary nodes normal. Neurologic: Grossly normal Incision/Wound: Recent port / extraction sites c/d/i. JP removed and dry dressing applied.   Disposition:      Medication List    STOP taking these medications       aspirin 81 MG tablet     FLOMAX 0.4 MG Caps capsule  Generic drug:  tamsulosin      TAKE these medications       allopurinol 300 MG tablet  Commonly known as:  ZYLOPRIM  Take 300 mg by mouth daily.     amLODipine-benazepril 10-20 MG per capsule  Commonly known as:  LOTREL  Take 1 capsule by mouth every morning.     atorvastatin 10 MG tablet  Commonly known as:  LIPITOR  Take 10 mg by mouth every morning.     ciprofloxacin 500 MG tablet  Commonly known as:  CIPRO  Take 1 tablet (500 mg total) by mouth 2 (two) times daily. Start day prior to office visit for foley removal     esomeprazole 40 MG capsule  Commonly known as:  NEXIUM  Take 40 mg by mouth daily at 12 noon.     furosemide 40 MG tablet  Commonly known as:  LASIX  Take 40 mg by mouth every morning.     HYDROcodone-acetaminophen 5-325 MG per tablet  Commonly known as:  NORCO  Take 1-2 tablets by mouth every 6 (six) hours as needed.     nitroGLYCERIN 0.4 MG SL tablet  Commonly known as:  NITROSTAT  Place 0.4 mg under the tongue every 5 (five) minutes as needed for chest pain.     senna-docusate 8.6-50 MG per tablet  Commonly known  as:  Senokot-S  Take 1 tablet by mouth 2 (two) times daily. While taking pain meds to prevent constipation     TRADJENTA 5 MG Tabs tablet  Generic drug:  linagliptin  Take 5 mg by mouth. TAKES IN AM           Follow-up Information   Follow up with Alexis Frock, MD On 05/13/2014. (at 8:30 AM)    Specialty:  Urology   Contact information:   [place] [address] [phone]       Signed: Alexis Frock 05/03/2014, 6:50 AM

## 2014-05-23 ENCOUNTER — Other Ambulatory Visit: Payer: Federal, State, Local not specified - PPO | Admitting: Internal Medicine

## 2014-05-23 DIAGNOSIS — E119 Type 2 diabetes mellitus without complications: Secondary | ICD-10-CM

## 2014-05-23 DIAGNOSIS — Z79899 Other long term (current) drug therapy: Secondary | ICD-10-CM

## 2014-05-23 DIAGNOSIS — E785 Hyperlipidemia, unspecified: Secondary | ICD-10-CM

## 2014-05-23 LAB — HEPATIC FUNCTION PANEL
ALBUMIN: 4 g/dL (ref 3.5–5.2)
ALT: 22 U/L (ref 0–53)
AST: 16 U/L (ref 0–37)
Alkaline Phosphatase: 65 U/L (ref 39–117)
Bilirubin, Direct: 0.1 mg/dL (ref 0.0–0.3)
Indirect Bilirubin: 0.5 mg/dL (ref 0.2–1.2)
TOTAL PROTEIN: 6.5 g/dL (ref 6.0–8.3)
Total Bilirubin: 0.6 mg/dL (ref 0.2–1.2)

## 2014-05-23 LAB — HEMOGLOBIN A1C
Hgb A1c MFr Bld: 6.3 % — ABNORMAL HIGH (ref <5.7)
MEAN PLASMA GLUCOSE: 134 mg/dL — AB (ref <117)

## 2014-05-23 LAB — LIPID PANEL
CHOL/HDL RATIO: 2 ratio
Cholesterol: 137 mg/dL (ref 0–200)
HDL: 68 mg/dL (ref >39)
LDL CALC: 58 mg/dL (ref 0–99)
Triglycerides: 55 mg/dL (ref <150)
VLDL: 11 mg/dL (ref 0–40)

## 2014-05-24 ENCOUNTER — Ambulatory Visit (INDEPENDENT_AMBULATORY_CARE_PROVIDER_SITE_OTHER): Payer: Federal, State, Local not specified - PPO | Admitting: Internal Medicine

## 2014-05-24 ENCOUNTER — Encounter: Payer: Self-pay | Admitting: Internal Medicine

## 2014-05-24 VITALS — BP 136/78 | HR 80 | Wt 288.0 lb

## 2014-05-24 DIAGNOSIS — I10 Essential (primary) hypertension: Secondary | ICD-10-CM

## 2014-05-24 DIAGNOSIS — E785 Hyperlipidemia, unspecified: Secondary | ICD-10-CM

## 2014-05-24 DIAGNOSIS — E119 Type 2 diabetes mellitus without complications: Secondary | ICD-10-CM

## 2014-05-24 DIAGNOSIS — Z8546 Personal history of malignant neoplasm of prostate: Secondary | ICD-10-CM

## 2014-05-25 ENCOUNTER — Other Ambulatory Visit: Payer: Self-pay | Admitting: Internal Medicine

## 2014-06-05 NOTE — Progress Notes (Addendum)
Subjective:    Patient ID: GEOVANNY SARTIN, male    DOB: 11-29-1945, 68 y.o.   MRN: [id number]  HPI   68 year old Black Male for health maintenance and evaluation of medical issues.  History of adult onset diabetes mellitus, hypertension, hyperlipidemia, nonocclusive coronary artery disease, GE reflux, gout. Also has tinea versicolor, GE reflux, bilateral carpal syndrome, peripheral neuropathy, sleep apnea and low testosterone. Followed by Dr. Janice Norrie for BPH.  Kidney stone around 1996. History of low back pain. Left knee surgery February 1999 and again in July 1999.  Pneumovax 2007. Tdap  vaccine June 2011. Colonoscopy done February 2011 by Dr. Sharlett Iles. He does have a CPAP apparatus. Sees Dr. Dorris Carnes for cardiology issues. He had a previous cardiac catheterization a number of years ago with normal left ventricular function and an ejection fraction of 60%.  He has history of iron deficiency anemia. B12 and folate levels were checked in 2013 and were normal.  History of adenomatous colon polyp in 2007 but colonoscopies in 2007 and 2011 showed no reason for an efficiency. He had upper endoscopy in February 2011 showing moderate gastritis.  History of angioedema but not recently. It occurs after eating some types of foods. History of benign monoclonal gammopathy.  Has regular eye exam yearly.  Patient had thrombosed external hemorrhoid Dr. Georgette Dover  in 2010.    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 He is married. Does not smoke or consume alcohol. 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  Family history: Father died of an MI. Mother died of an MI. 2 brothers with history of diabetes. One sister. Both parents have hypertension. Mother had diabetes.    Review of Systems  Constitutional: Negative.   HENT: Negative.   Eyes: Negative for visual disturbance.  Respiratory:       History of sleep apnea    Cardiovascular: Negative for chest pain.  Gastrointestinal: Negative.   Genitourinary:       History of BPH and kidney stone  Neurological:       History of peripheral neuropathy  Hematological: Negative.        History of iron deficiency  Psychiatric/Behavioral: Negative.        Objective:   Physical Exam  Vitals reviewed. Constitutional: He is oriented to person, place, and time. He appears well-developed and well-nourished. No distress.  HENT:  Head: Normocephalic and atraumatic.  Right Ear: External ear normal.  Left Ear: External ear normal.  Mouth/Throat: Oropharynx is clear and moist. No oropharyngeal exudate.  Eyes: Conjunctivae and EOM are normal. Pupils are equal, round, and reactive to light. Right eye exhibits no discharge. Left eye exhibits no discharge. No scleral icterus.  Neck: Neck supple. No JVD present. No thyromegaly present.  Cardiovascular: Normal rate, regular rhythm, normal heart sounds and intact distal pulses.   No murmur heard. Pulmonary/Chest: Effort normal and breath sounds normal. No respiratory distress. He has no wheezes. He has no rales. He exhibits no tenderness.  Abdominal: Soft. Bowel sounds are normal. He exhibits no distension and no mass. There is no tenderness. There is no rebound and no guarding.  Genitourinary:  Deferred to Dr. Janice Norrie  Musculoskeletal: Normal range of motion. He exhibits no edema.  Lymphadenopathy:    He has no cervical adenopathy.  Neurological: He is alert and oriented to person, place, and time. He has normal reflexes. No cranial nerve deficit.  Coordination normal.  Skin: Skin is warm and dry. No rash noted. He is not diaphoretic.  Psychiatric: He has a normal mood and affect. His behavior is normal. Judgment and thought content normal.          Assessment & Plan:   Hypertension-stable  GE reflux treated with PPI  History of sleep apnea treated with CPAP  History of BPH followed by Dr. Janice Norrie. Note elevation  in PSA.  Adult onset diabetes-diet controlled  History of tinea versicolor treated with Nizoral shampoo  History of kidney stone mid 1990s  History benign monoclonal gammopathy-seen by Dr. Earlie Server  History of iron deficiency anemia 2013  History of gastritis based on endoscopy 2011  History of gout  History bilateral carpal tunnel syndrome  History of nonocclusive cardiovascular disease  Obesity  Plan: Continue same medications and return in 6 months. Try to diet exercise and lose weight. Continue followup with urologist

## 2014-06-05 NOTE — Addendum Note (Signed)
Addended by: [name] on: 06/05/2014 04:56 PM   Modules accepted: Medications

## 2014-07-28 DIAGNOSIS — Z8546 Personal history of malignant neoplasm of prostate: Secondary | ICD-10-CM | POA: Insufficient documentation

## 2014-07-28 NOTE — Progress Notes (Signed)
   Subjective:    Patient ID: Leonard [name], male    DOB: [birth year], 68 y.o.   MRN: [id number]  HPI  Patient discharged in mid June after undergoing prostatectomy for prostate cancer. Both lobes of the prostate involved adenocarcinoma. Lymph node sampling was negative for tumor. Gleason score was 8/10. Surgery was done by Dr. Tresa Moore. Beginning to feel better and get his strength back. Labs done here on July 9 showed normal lipid panel and liver functions. Probably has not been eating as much since his surgery. Hemoglobin A1c stable at 6.3%.  Continues to be followed at the [facility] by Dr. Earlie Server for history of iron deficiency anemia and benign monoclonal gammopathy.  In May, he saw Dr. Dorris Carnes for Cardiology followup and Dr. Gwenette Greet for obstructive sleep apnea    Review of Systems     Objective:   Physical Exam  Skin is warm and dry. Neck is supple without JVD thyromegaly or carotid bruits. Chest clear to auscultation. Cardiac exam regular rate and rhythm. Extremities without edema. Diabetic foot exam performed.      Assessment & Plan:  Status post robotic assisted prostatectomy for adenocarcinoma involving both lobes of the prostate  Hyperlipidemia-stable  Hypertension-stable  Type 2 diabetes mellitus-well controlled  History of benign monoclonal gammopathy  History of iron deficiency  History of coronary disease  Obstructive sleep apnea  Plan: Patient is to return in 6 months for physical examination.

## 2014-07-28 NOTE — Patient Instructions (Signed)
Return in 6 months for physical examination. Reminded about diabetic eye exam. Annual flu shot recommended.

## 2014-08-01 ENCOUNTER — Telehealth: Payer: Self-pay | Admitting: Internal Medicine

## 2014-08-01 ENCOUNTER — Other Ambulatory Visit (HOSPITAL_BASED_OUTPATIENT_CLINIC_OR_DEPARTMENT_OTHER): Payer: Federal, State, Local not specified - PPO

## 2014-08-01 DIAGNOSIS — R5383 Other fatigue: Secondary | ICD-10-CM

## 2014-08-01 DIAGNOSIS — D509 Iron deficiency anemia, unspecified: Secondary | ICD-10-CM

## 2014-08-01 DIAGNOSIS — Z8603 Personal history of neoplasm of uncertain behavior: Secondary | ICD-10-CM

## 2014-08-01 DIAGNOSIS — R5381 Other malaise: Secondary | ICD-10-CM

## 2014-08-01 DIAGNOSIS — Z8639 Personal history of other endocrine, nutritional and metabolic disease: Secondary | ICD-10-CM

## 2014-08-01 LAB — CBC WITH DIFFERENTIAL/PLATELET
BASO%: 0.2 % (ref 0.0–2.0)
Basophils Absolute: 0 10*3/uL (ref 0.0–0.1)
EOS%: 1.5 % (ref 0.0–7.0)
Eosinophils Absolute: 0.1 10*3/uL (ref 0.0–0.5)
HCT: 38.8 % (ref 38.4–49.9)
HGB: 12.7 g/dL — ABNORMAL LOW (ref 13.0–17.1)
LYMPH#: 2.6 10*3/uL (ref 0.9–3.3)
LYMPH%: 29.1 % (ref 14.0–49.0)
MCH: 30.4 pg (ref 27.2–33.4)
MCHC: 32.7 g/dL (ref 32.0–36.0)
MCV: 93.1 fL (ref 79.3–98.0)
MONO#: 0.6 10*3/uL (ref 0.1–0.9)
MONO%: 6.7 % (ref 0.0–14.0)
NEUT#: 5.5 10*3/uL (ref 1.5–6.5)
NEUT%: 62.5 % (ref 39.0–75.0)
Platelets: 248 10*3/uL (ref 140–400)
RBC: 4.17 10*6/uL — ABNORMAL LOW (ref 4.20–5.82)
RDW: 15.6 % — ABNORMAL HIGH (ref 11.0–14.6)
WBC: 8.8 10*3/uL (ref 4.0–10.3)

## 2014-08-01 LAB — COMPREHENSIVE METABOLIC PANEL (CC13)
ALT: 12 U/L (ref 0–55)
ANION GAP: 10 meq/L (ref 3–11)
AST: 14 U/L (ref 5–34)
Albumin: 3.6 g/dL (ref 3.5–5.0)
Alkaline Phosphatase: 83 U/L (ref 40–150)
BILIRUBIN TOTAL: 0.61 mg/dL (ref 0.20–1.20)
BUN: 13.4 mg/dL (ref 7.0–26.0)
CO2: 24 meq/L (ref 22–29)
Calcium: 9.4 mg/dL (ref 8.4–10.4)
Chloride: 107 mEq/L (ref 98–109)
Creatinine: 1.1 mg/dL (ref 0.7–1.3)
Glucose: 114 mg/dl (ref 70–140)
Potassium: 3.7 mEq/L (ref 3.5–5.1)
Sodium: 141 mEq/L (ref 136–145)
TOTAL PROTEIN: 7.5 g/dL (ref 6.4–8.3)

## 2014-08-01 LAB — IRON AND TIBC CHCC
%SAT: 19 % — ABNORMAL LOW (ref 20–55)
Iron: 57 ug/dL (ref 42–163)
TIBC: 305 ug/dL (ref 202–409)
UIBC: 248 ug/dL (ref 117–376)

## 2014-08-01 LAB — FERRITIN CHCC: Ferritin: 70 ng/ml (ref 22–316)

## 2014-08-01 LAB — LACTATE DEHYDROGENASE (CC13): LDH: 221 U/L (ref 125–245)

## 2014-08-01 NOTE — Telephone Encounter (Signed)
s.w. pt and advised on MD on Pal....pt aware of new d.t

## 2014-08-05 LAB — IGG, IGA, IGM
IGA: 233 mg/dL (ref 68–379)
IGG (IMMUNOGLOBIN G), SERUM: 1110 mg/dL (ref 650–1600)
IGM, SERUM: 133 mg/dL (ref 41–251)

## 2014-08-05 LAB — KAPPA/LAMBDA LIGHT CHAINS
KAPPA LAMBDA RATIO: 1.46 (ref 0.26–1.65)
Kappa free light chain: 3.05 mg/dL — ABNORMAL HIGH (ref 0.33–1.94)
LAMBDA FREE LGHT CHN: 2.09 mg/dL (ref 0.57–2.63)

## 2014-08-05 LAB — BETA 2 MICROGLOBULIN, SERUM: Beta-2 Microglobulin: 2.06 mg/L (ref <=2.51)

## 2014-08-08 ENCOUNTER — Ambulatory Visit: Payer: Federal, State, Local not specified - PPO | Admitting: Internal Medicine

## 2014-08-21 ENCOUNTER — Other Ambulatory Visit: Payer: Self-pay | Admitting: Internal Medicine

## 2014-08-22 ENCOUNTER — Ambulatory Visit (HOSPITAL_BASED_OUTPATIENT_CLINIC_OR_DEPARTMENT_OTHER): Payer: Federal, State, Local not specified - PPO | Admitting: Internal Medicine

## 2014-08-22 ENCOUNTER — Telehealth: Payer: Self-pay | Admitting: Internal Medicine

## 2014-08-22 ENCOUNTER — Encounter: Payer: Self-pay | Admitting: Internal Medicine

## 2014-08-22 ENCOUNTER — Other Ambulatory Visit: Payer: Self-pay

## 2014-08-22 VITALS — BP 155/84 | HR 74 | Temp 97.8°F | Resp 19 | Ht 69.0 in | Wt 292.1 lb

## 2014-08-22 DIAGNOSIS — C61 Malignant neoplasm of prostate: Secondary | ICD-10-CM

## 2014-08-22 DIAGNOSIS — D472 Monoclonal gammopathy: Secondary | ICD-10-CM

## 2014-08-22 DIAGNOSIS — D509 Iron deficiency anemia, unspecified: Secondary | ICD-10-CM

## 2014-08-22 DIAGNOSIS — Z8603 Personal history of neoplasm of uncertain behavior: Secondary | ICD-10-CM

## 2014-08-22 DIAGNOSIS — Z8639 Personal history of other endocrine, nutritional and metabolic disease: Secondary | ICD-10-CM

## 2014-08-22 MED ORDER — AMLODIPINE BESY-BENAZEPRIL HCL 10-20 MG PO CAPS: 1.0000 | ORAL_CAPSULE | ORAL | Status: DC

## 2014-08-22 NOTE — Telephone Encounter (Signed)
gv and printed appt sched and avs for tpf ro April 2016

## 2014-08-22 NOTE — Progress Notes (Signed)
[facility] Telephone:([phone]) [phone]   Fax:([phone]) [phone]  OFFICE PROGRESS NOTE  [name], MD [address] [place] [address]  DIAGNOSIS:  1) monoclonal gammopathy of undetermined significance.  2) iron deficiency anemia.  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 Tresa Moore.   PRIOR THERAPY: Feraheme infusion x2 doses, last dose was given in October of 2014.  CURRENT THERAPY: Observation.  INTERVAL HISTORY: Leonard [name] 67 y.o. male returns to the clinic today for follow up visit. He is feeling very well today with no significant fatigue or weakness. He recently underwent surgery for prostate cancer which showed high grade prostate adenocarcinoma and he is currently followed by Dr. Tresa Moore. He denied having any bleeding issues he specifically no rectal bleeding. He is not taking any oral iron supplements at this point. The patient denied having any significant nausea or vomiting. He denied having any significant chest pain, shortness of breath, cough or hemoptysis. He has a recent lab work including CBC, iron study and ferritin as well as myeloma panel and he is here for evaluation and discussion of his lab results.  MEDICAL HISTORY: Past Medical History  Diagnosis Date  . Type II or unspecified type diabetes mellitus without mention of complication, not stated as uncontrolled   . Gouty arthropathy   . Other and unspecified hyperlipidemia   . Unspecified essential hypertension   . OSA (obstructive sleep apnea)     USES CPAP MACHINE   . Diverticulosis of colon (without mention of hemorrhage) 09/05/2006    Colonoscopy   . Hx of colonic polyps 09/05/2006    Colonoscopy(ADENOMATOUS POLYP)  . Hiatal hernia 09/05/2006    EGD  . 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 GERD (gastroesophageal reflux disease) 07/26/2001    EGD(Chronic)  . Hemorrhoids   . Coronary artery disease   . Cancer     PROSTATE CANCER  . Iron deficiency anemia, unspecified     ALLERGIES:  is allergic to dicloxacillin and cayenne pepper.  MEDICATIONS:  Current Outpatient Prescriptions  Medication Sig Dispense Refill  . allopurinol (ZYLOPRIM) 300 MG tablet Take 300 mg by mouth daily.      Marland Kitchen amLODipine-benazepril (LOTREL) 10-20 MG per capsule Take 1 capsule by mouth every morning.      Marland Kitchen atorvastatin (LIPITOR) 10 MG tablet TAKE 1 TABLET BY MOUTH DAILY  90 tablet  0  . esomeprazole (NEXIUM) 40 MG capsule Take 40 mg by mouth daily at 12 noon.      . furosemide (LASIX) 40 MG tablet Take 40 mg by mouth every morning.       . linagliptin (TRADJENTA) 5 MG TABS tablet Take 5 mg by mouth. TAKES IN AM      . tamsulosin (FLOMAX) 0.4 MG CAPS capsule       . TRADJENTA 5 MG TABS tablet TAKE 1 TABLET BY MOUTH EVERY DAY  30 tablet  5  . nitroGLYCERIN (NITROSTAT) 0.4 MG SL tablet Place 0.4 mg under the tongue every 5 (five) minutes as needed for chest pain.       No current facility-administered medications for this visit.    SURGICAL HISTORY:  Past Surgical History  Procedure Laterality Date  . 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 Hemorrhoid  surgery    . Robot assisted laparoscopic radical prostatectomy N/A 05/01/2014    Procedure: ROBOTIC ASSISTED LAPAROSCOPIC RADICAL PROSTATECTOMY WITH INDOCYANINE GREEN DYE;  Surgeon: Alexis Frock, MD;  Location: [facility];  Service: Urology;  Laterality: N/A;  . Lymphadenectomy Bilateral 05/01/2014    Procedure: LYMPHADENECTOMY;  Surgeon: Alexis Frock, MD;  Location: [facility];  Service: Urology;  Laterality: Bilateral;    REVIEW OF SYSTEMS:  Constitutional: positive for fatigue Eyes: negative Ears, nose, mouth, throat, and face: negative Respiratory: negative Cardiovascular: negative Gastrointestinal:  negative Genitourinary:negative Integument/breast: negative Hematologic/lymphatic: negative Musculoskeletal:negative Neurological: negative Behavioral/Psych: negative Endocrine: negative Allergic/Immunologic: negative   PHYSICAL EXAMINATION: General appearance: alert, cooperative, fatigued and no distress Head: Normocephalic, without obvious abnormality, atraumatic Neck: no adenopathy and thyroid not enlarged, symmetric, no tenderness/mass/nodules Lymph nodes: Cervical, supraclavicular, and axillary nodes normal. Resp: clear to auscultation bilaterally Back: symmetric, no curvature. ROM normal. No CVA tenderness. Cardio: regular rate and rhythm, S1, S2 normal, no murmur, click, rub or gallop GI: soft, non-tender; bowel sounds normal; no masses,  no organomegaly Extremities: extremities normal, atraumatic, no cyanosis or edema Neurologic: Alert and oriented X 3, normal strength and tone. Normal symmetric reflexes. Normal coordination and gait  ECOG PERFORMANCE STATUS: 1 - Symptomatic but completely ambulatory  Blood pressure 155/84, pulse 74, temperature 97.8 F (36.6 C), temperature source Oral, resp. rate 19, height 5\' 9"  (1.753 m), weight 292 lb 1.6 oz (132.496 kg), SpO2 100.00%.  LABORATORY DATA: Lab Results  Component Value Date   WBC 8.8 08/01/2014   HGB 12.7* 08/01/2014   HCT 38.8 08/01/2014   MCV 93.1 08/01/2014   PLT 248 08/01/2014      Chemistry      Component Value Date/Time   NA 141 08/01/2014 0917   NA 136* 05/02/2014 0449   K 3.7 08/01/2014 0917   K 4.1 05/02/2014 0449   CL 102 05/02/2014 0449   CL 104 04/17/2013 0857   CO2 24 08/01/2014 0917   CO2 23 05/02/2014 0449   BUN 13.4 08/01/2014 0917   BUN 11 05/02/2014 0449   CREATININE 1.1 08/01/2014 0917   CREATININE 0.99 05/02/2014 0449   CREATININE 1.08 12/13/2013 0934      Component Value Date/Time   CALCIUM 9.4 08/01/2014 0917   CALCIUM 8.6 05/02/2014 0449   ALKPHOS 83 08/01/2014 0917   ALKPHOS 65 05/23/2014 1113   AST  14 08/01/2014 0917   AST 16 05/23/2014 1113   ALT 12 08/01/2014 0917   ALT 22 05/23/2014 1113   BILITOT 0.61 08/01/2014 0917   BILITOT 0.6 05/23/2014 1113     Other lab studies: ferritin 70, serum iron 57, total iron-binding capacity 305 and iron saturation 19%. Free kappa light chain 3.05, free lambda light chain 2.09 with a kappa/lambda ratio of 1.46. Beta-2 microglobulin 2.06.  RADIOGRAPHIC STUDIES: No results found.  ASSESSMENT AND PLAN:  1) iron deficiency anemia most likely secondary to GI blood loss. His anemia has completely resolved at this point and the patient is currently off iron supplements and doing very well. We'll continue to monitor.  2) Monoclonal gammopathy of undetermined significance: no significant evidence for disease progression. The patient will continue on observation for now.  3) prostate adenocarcinoma status post radical prostatectomy under the care of Dr. Tresa Moore. He will continue to followup with his urologist. He would come back for follow up visit in 6 months with repeat myeloma panel and iron study. The patient was advised to call immediately if he has any concerning  symptoms in the interval.  The patient voices understanding of current disease status and treatment options and is in agreement with the current care plan.  All questions were answered. The patient knows to call the clinic with any problems, questions or concerns. We can certainly see the patient much sooner if necessary.  Disclaimer: This note was dictated with voice recognition software. Similar sounding words can inadvertently be transcribed and may not be corrected upon review.

## 2014-08-26 ENCOUNTER — Encounter: Payer: Self-pay | Admitting: Gastroenterology

## 2014-08-28 ENCOUNTER — Other Ambulatory Visit: Payer: Self-pay | Admitting: Internal Medicine

## 2014-09-09 ENCOUNTER — Encounter: Payer: Self-pay | Admitting: Internal Medicine

## 2014-10-14 LAB — HM DIABETES EYE EXAM

## 2014-10-30 ENCOUNTER — Encounter: Payer: Self-pay | Admitting: Internal Medicine

## 2014-11-07 ENCOUNTER — Other Ambulatory Visit: Payer: Self-pay | Admitting: Internal Medicine

## 2014-11-25 ENCOUNTER — Other Ambulatory Visit: Payer: Self-pay | Admitting: Internal Medicine

## 2014-11-29 ENCOUNTER — Other Ambulatory Visit: Payer: Self-pay | Admitting: Internal Medicine

## 2014-11-29 ENCOUNTER — Other Ambulatory Visit: Payer: Self-pay | Admitting: *Deleted

## 2014-11-29 MED ORDER — LINAGLIPTIN 5 MG PO TABS: 5.0000 mg | ORAL_TABLET | Freq: Every day | ORAL | Status: DC

## 2014-11-29 NOTE — Telephone Encounter (Signed)
Patient called needs refill on tradjenta refill sent

## 2014-12-19 ENCOUNTER — Other Ambulatory Visit: Payer: Self-pay | Admitting: Internal Medicine

## 2014-12-19 ENCOUNTER — Other Ambulatory Visit: Payer: Managed Care, Other (non HMO) | Admitting: Internal Medicine

## 2014-12-19 DIAGNOSIS — Z0000 Encounter for general adult medical examination without abnormal findings: Secondary | ICD-10-CM

## 2014-12-19 DIAGNOSIS — D649 Anemia, unspecified: Secondary | ICD-10-CM

## 2014-12-19 DIAGNOSIS — Z13220 Encounter for screening for lipoid disorders: Secondary | ICD-10-CM

## 2014-12-19 DIAGNOSIS — Z125 Encounter for screening for malignant neoplasm of prostate: Secondary | ICD-10-CM

## 2014-12-19 LAB — CBC WITH DIFFERENTIAL/PLATELET
BASOS PCT: 0 % (ref 0–1)
Basophils Absolute: 0 10*3/uL (ref 0.0–0.1)
EOS PCT: 1 % (ref 0–5)
Eosinophils Absolute: 0.1 10*3/uL (ref 0.0–0.7)
HCT: 40 % (ref 39.0–52.0)
Hemoglobin: 13.2 g/dL (ref 13.0–17.0)
LYMPHS PCT: 36 % (ref 12–46)
Lymphs Abs: 3.5 10*3/uL (ref 0.7–4.0)
MCH: 30.3 pg (ref 26.0–34.0)
MCHC: 33 g/dL (ref 30.0–36.0)
MCV: 92 fL (ref 78.0–100.0)
MONOS PCT: 7 % (ref 3–12)
MPV: 10 fL (ref 8.6–12.4)
Monocytes Absolute: 0.7 10*3/uL (ref 0.1–1.0)
NEUTROS PCT: 56 % (ref 43–77)
Neutro Abs: 5.4 10*3/uL (ref 1.7–7.7)
Platelets: 286 10*3/uL (ref 150–400)
RBC: 4.35 MIL/uL (ref 4.22–5.81)
RDW: 15.6 % — ABNORMAL HIGH (ref 11.5–15.5)
WBC: 9.6 10*3/uL (ref 4.0–10.5)

## 2014-12-19 LAB — COMPREHENSIVE METABOLIC PANEL
ALBUMIN: 4.1 g/dL (ref 3.5–5.2)
ALK PHOS: 75 U/L (ref 39–117)
ALT: 16 U/L (ref 0–53)
AST: 15 U/L (ref 0–37)
BILIRUBIN TOTAL: 0.7 mg/dL (ref 0.2–1.2)
BUN: 16 mg/dL (ref 6–23)
CALCIUM: 9.8 mg/dL (ref 8.4–10.5)
CHLORIDE: 105 meq/L (ref 96–112)
CO2: 26 mEq/L (ref 19–32)
Creat: 1.06 mg/dL (ref 0.50–1.35)
GLUCOSE: 94 mg/dL (ref 70–99)
Potassium: 4.1 mEq/L (ref 3.5–5.3)
SODIUM: 141 meq/L (ref 135–145)
Total Protein: 7.2 g/dL (ref 6.0–8.3)

## 2014-12-19 LAB — LIPID PANEL
CHOL/HDL RATIO: 2.2 ratio
Cholesterol: 161 mg/dL (ref 0–200)
HDL: 74 mg/dL (ref >39)
LDL CALC: 74 mg/dL (ref 0–99)
Triglycerides: 67 mg/dL (ref <150)
VLDL: 13 mg/dL (ref 0–40)

## 2014-12-20 ENCOUNTER — Encounter: Payer: Self-pay | Admitting: Internal Medicine

## 2014-12-20 ENCOUNTER — Ambulatory Visit (INDEPENDENT_AMBULATORY_CARE_PROVIDER_SITE_OTHER): Payer: Managed Care, Other (non HMO) | Admitting: Internal Medicine

## 2014-12-20 VITALS — BP 138/72 | HR 77 | Temp 97.9°F | Wt 288.0 lb

## 2014-12-20 DIAGNOSIS — Z0000 Encounter for general adult medical examination without abnormal findings: Secondary | ICD-10-CM | POA: Diagnosis not present

## 2014-12-20 DIAGNOSIS — G5601 Carpal tunnel syndrome, right upper limb: Secondary | ICD-10-CM

## 2014-12-20 DIAGNOSIS — Z23 Encounter for immunization: Secondary | ICD-10-CM | POA: Diagnosis not present

## 2014-12-20 DIAGNOSIS — Z8739 Personal history of other diseases of the musculoskeletal system and connective tissue: Secondary | ICD-10-CM

## 2014-12-20 DIAGNOSIS — Z8679 Personal history of other diseases of the circulatory system: Secondary | ICD-10-CM | POA: Diagnosis not present

## 2014-12-20 DIAGNOSIS — Z8719 Personal history of other diseases of the digestive system: Secondary | ICD-10-CM | POA: Diagnosis not present

## 2014-12-20 DIAGNOSIS — Z8639 Personal history of other endocrine, nutritional and metabolic disease: Secondary | ICD-10-CM | POA: Diagnosis not present

## 2014-12-20 DIAGNOSIS — D126 Benign neoplasm of colon, unspecified: Secondary | ICD-10-CM

## 2014-12-20 DIAGNOSIS — E119 Type 2 diabetes mellitus without complications: Secondary | ICD-10-CM

## 2014-12-20 DIAGNOSIS — R809 Proteinuria, unspecified: Secondary | ICD-10-CM

## 2014-12-20 DIAGNOSIS — Z8546 Personal history of malignant neoplasm of prostate: Secondary | ICD-10-CM | POA: Diagnosis not present

## 2014-12-20 DIAGNOSIS — G609 Hereditary and idiopathic neuropathy, unspecified: Secondary | ICD-10-CM

## 2014-12-20 DIAGNOSIS — G4733 Obstructive sleep apnea (adult) (pediatric): Secondary | ICD-10-CM

## 2014-12-20 DIAGNOSIS — E785 Hyperlipidemia, unspecified: Secondary | ICD-10-CM

## 2014-12-20 DIAGNOSIS — K219 Gastro-esophageal reflux disease without esophagitis: Secondary | ICD-10-CM

## 2014-12-20 DIAGNOSIS — I10 Essential (primary) hypertension: Secondary | ICD-10-CM

## 2014-12-20 DIAGNOSIS — G5603 Carpal tunnel syndrome, bilateral upper limbs: Secondary | ICD-10-CM

## 2014-12-20 DIAGNOSIS — G5602 Carpal tunnel syndrome, left upper limb: Secondary | ICD-10-CM

## 2014-12-20 DIAGNOSIS — D472 Monoclonal gammopathy: Secondary | ICD-10-CM

## 2014-12-20 LAB — HEMOGLOBIN A1C
Hgb A1c MFr Bld: 6.2 % — ABNORMAL HIGH (ref <5.7)
Mean Plasma Glucose: 131 mg/dL — ABNORMAL HIGH (ref <117)

## 2014-12-20 LAB — POCT URINALYSIS DIPSTICK
Bilirubin, UA: NEGATIVE
Glucose, UA: NEGATIVE
KETONES UA: NEGATIVE
LEUKOCYTES UA: NEGATIVE
Nitrite, UA: NEGATIVE
PH UA: 6.5
Protein, UA: NEGATIVE
RBC UA: NEGATIVE
SPEC GRAV UA: 1.01
Urobilinogen, UA: NEGATIVE

## 2014-12-20 LAB — PSA: PSA: <0.01 ng/mL (ref <=4.00)

## 2014-12-21 LAB — MICROALBUMIN / CREATININE URINE RATIO
CREATININE, URINE: 101.8 mg/dL
MICROALB/CREAT RATIO: 28.5 mg/g (ref 0.0–30.0)
Microalb, Ur: 2.9 mg/dL — ABNORMAL HIGH (ref <2.0)

## 2014-12-23 ENCOUNTER — Other Ambulatory Visit: Payer: Self-pay | Admitting: Internal Medicine

## 2015-01-05 ENCOUNTER — Other Ambulatory Visit: Payer: Self-pay | Admitting: Internal Medicine

## 2015-01-21 ENCOUNTER — Telehealth: Payer: Self-pay | Admitting: Internal Medicine

## 2015-01-21 ENCOUNTER — Other Ambulatory Visit: Payer: Self-pay | Admitting: *Deleted

## 2015-01-21 MED ORDER — VALACYCLOVIR HCL 500 MG PO TABS: 500.0000 mg | ORAL_TABLET | Freq: Two times a day (BID) | ORAL | Status: DC

## 2015-01-21 NOTE — Telephone Encounter (Signed)
Call in Valtrex 500 mg bid x 5 days

## 2015-01-21 NOTE — Telephone Encounter (Signed)
Valtrex script sent to pharmacy.

## 2015-01-21 NOTE — Telephone Encounter (Signed)
Has a fever blister for over a week now.  Abreva is not working for him.  Wants to know if you will please call him something in.    Pharmacy:  [facility] [address] in [facility])

## 2015-02-13 ENCOUNTER — Encounter: Payer: Self-pay | Admitting: Internal Medicine

## 2015-02-13 NOTE — Patient Instructions (Addendum)
Please try to take care of yourself. Diet exercise and lose weight. Continue same medications. Return in 6 months. It was a pleasure to see you today.

## 2015-02-13 NOTE — Progress Notes (Signed)
Subjective:    Patient ID: Leonard [name], male    DOB: 23-May-1946, 69 y.o.   MRN: [id number]  HPI  69 year old Black male in today for health maintenance exam and evaluation of medical issues. Underwent surgery for prostate cancer June 2015. Both lobes of prostate involved adenocarcinoma. Lymph node sampling was negative for tumor. Gleason score was 8/10. Surgery done but Dr. Tresa Moore. Continues to be followed at [place] by Dr. Earlie Server for history of iron deficiency anemia and benign monoclonal gammopathy. History of essential hypertension, diabetes mellitus, obesity, hyperlipidemia, metabolic syndrome. History of obstructive sleep apnea. Followed by Dr. Dorris Carnes for history of coronary artery disease. Minimal coronary artery disease on catheterization in 2007.  History of GE reflux, gout, tinea versicolor, bilateral carpal tunnel syndrome, peripheral neuropathy, sleep apnea, low testosterone. Had been very followed closely for a long time by Dr. Janice Norrie monitoring his PSA. Weight in July 2015 was 288 pounds. Weight in January 2015 was 290 pounds. Weight today is 288 pounds.  In 2013, B 12 and folate levels were checked and found to be normal. He has a history of iron deficiency anemia. History of benign monoclonal gammopathy. With his previous Cardiac catheterization he had normal LV function and an ejection fraction of 60%. He does have a C pap apparatus.  He had an adenomatous colon polyp in 2007 but colonoscopies in 2007 and 2011 showed no reason for iron deficiency. He had an upper endoscopy in February 2011 showing moderate gastritis.  History of angioedema but no episodes recently and occurs  sometimes after eating some type of foods.  Has regular annual eye exam.  Had thrombosed external hemorrhoid incised by Dr. Georgette Dover in 2010.  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 Does not smoke or consume  alcohol. He is sedentary.  Family history: Father died of an MI. Mother died of an MI. 2 brothers with history of diabetes. One sister. Both parents had hypertension mother had diabetes.    Review of Systems  Constitutional: Positive for fatigue.  Respiratory:       History of sleep apnea  Cardiovascular: Negative for chest pain, palpitations and leg swelling.       History of minimal coronary artery disease on catheterization 2006  Gastrointestinal: Negative for abdominal pain, constipation and blood in stool.  Endocrine:       Diabetes mellitus and obesity as well as hyperlipidemia  Genitourinary:       Prostatectomy done in 2015  Musculoskeletal: Positive for arthralgias.  Allergic/Immunologic:       History of occasional angioedema  Hematological:       Sees Dr. Earlie Server for iron deficiency anemia and benign monoclonal gammopathy  Psychiatric/Behavioral: Negative.        Objective:   Physical Exam  Constitutional: He is oriented to person, place, and time. He appears well-developed and well-nourished. No distress.  HENT:  Head: Normocephalic and atraumatic.  Left Ear: External ear normal.  Mouth/Throat: No oropharyngeal exudate.  Eyes: Conjunctivae are normal. Pupils are equal, round, and reactive to light. Left eye exhibits no discharge. No scleral icterus.  Neck: Neck supple. No JVD present. No thyromegaly present.  Cardiovascular: Normal rate, regular rhythm and normal heart sounds.   No murmur heard. Pulmonary/Chest: Effort normal. No respiratory distress. He has no wheezes. He has no rales. He exhibits no tenderness.  Abdominal: Soft. Bowel sounds are normal.  He exhibits no distension and no mass. There is no tenderness. There is no rebound and no guarding.  Genitourinary:  Not examined. Deferred to urologist  Musculoskeletal: He exhibits no edema.  Neurological: He is alert and oriented to person, place, and time. He has normal reflexes. No cranial nerve deficit.  Coordination normal.  Skin: Skin is warm and dry. No rash noted. He is not diaphoretic.  Psychiatric: He has a normal mood and affect. His behavior is normal. Judgment and thought content normal.  Vitals reviewed.         Assessment & Plan:  Morbid obesity  Adult onset diabetes mellitus  Hypertension  Hyperlipidemia  Nonocclusive coronary artery disease  GE reflux  History of gout  History of tinea versicolor  History of prostate cancer  Bilateral carpal tunnel syndrome  Peripheral neuropathy  Sleep apnea  History of kidney stones  History of benign monoclonal gammopathy  History of iron deficiency  History of gastritis  History of adenomatous colon polyp  History of angioedema  Microalbuminuria  His lab work is amazingly stable. He does have microalbuminuria but hemoglobin A1c is stable at 6.2%. Kidney functions are within normal limits. He does have morbid obesity could benefit from 18 loss program. He's fairly sedentary. He has sleep apnea. History of iron deficiency likely due to gastritis. Prostatectomy in 2015. No chest pain although he has history of nonocclusive coronary artery disease in 2007.

## 2015-02-19 ENCOUNTER — Other Ambulatory Visit: Payer: Self-pay | Admitting: *Deleted

## 2015-02-19 ENCOUNTER — Other Ambulatory Visit: Payer: Self-pay

## 2015-02-19 MED ORDER — ATORVASTATIN CALCIUM 10 MG PO TABS: 10.0000 mg | ORAL_TABLET | Freq: Every day | ORAL | Status: DC

## 2015-02-19 MED ORDER — LINAGLIPTIN 5 MG PO TABS: 5.0000 mg | ORAL_TABLET | Freq: Every day | ORAL | Status: DC

## 2015-02-19 NOTE — Telephone Encounter (Signed)
Refills on tradjenta sent to patient pharmacy

## 2015-02-20 ENCOUNTER — Other Ambulatory Visit (HOSPITAL_BASED_OUTPATIENT_CLINIC_OR_DEPARTMENT_OTHER): Payer: 59

## 2015-02-20 DIAGNOSIS — Z8639 Personal history of other endocrine, nutritional and metabolic disease: Secondary | ICD-10-CM

## 2015-02-20 DIAGNOSIS — C61 Malignant neoplasm of prostate: Secondary | ICD-10-CM | POA: Diagnosis not present

## 2015-02-20 DIAGNOSIS — D472 Monoclonal gammopathy: Secondary | ICD-10-CM | POA: Diagnosis not present

## 2015-02-20 DIAGNOSIS — D509 Iron deficiency anemia, unspecified: Secondary | ICD-10-CM

## 2015-02-20 DIAGNOSIS — Z8603 Personal history of neoplasm of uncertain behavior: Secondary | ICD-10-CM

## 2015-02-20 LAB — CBC WITH DIFFERENTIAL/PLATELET
BASO%: 0.1 % (ref 0.0–2.0)
Basophils Absolute: 0 10*3/uL (ref 0.0–0.1)
EOS ABS: 0.1 10*3/uL (ref 0.0–0.5)
EOS%: 1.1 % (ref 0.0–7.0)
HEMATOCRIT: 38.8 % (ref 38.4–49.9)
HGB: 12.6 g/dL — ABNORMAL LOW (ref 13.0–17.1)
LYMPH%: 36.2 % (ref 14.0–49.0)
MCH: 30 pg (ref 27.2–33.4)
MCHC: 32.5 g/dL (ref 32.0–36.0)
MCV: 92.4 fL (ref 79.3–98.0)
MONO#: 0.6 10*3/uL (ref 0.1–0.9)
MONO%: 6.4 % (ref 0.0–14.0)
NEUT%: 56.2 % (ref 39.0–75.0)
NEUTROS ABS: 5 10*3/uL (ref 1.5–6.5)
PLATELETS: 247 10*3/uL (ref 140–400)
RBC: 4.2 10*6/uL (ref 4.20–5.82)
RDW: 14.7 % — AB (ref 11.0–14.6)
WBC: 8.9 10*3/uL (ref 4.0–10.3)
lymph#: 3.2 10*3/uL (ref 0.9–3.3)

## 2015-02-20 LAB — FERRITIN CHCC: Ferritin: 64 ng/ml (ref 22–316)

## 2015-02-20 LAB — COMPREHENSIVE METABOLIC PANEL (CC13)
ALT: 28 U/L (ref 0–55)
AST: 23 U/L (ref 5–34)
Albumin: 3.7 g/dL (ref 3.5–5.0)
Alkaline Phosphatase: 81 U/L (ref 40–150)
Anion Gap: 8 mEq/L (ref 3–11)
BUN: 15.2 mg/dL (ref 7.0–26.0)
CO2: 26 mEq/L (ref 22–29)
Calcium: 9.2 mg/dL (ref 8.4–10.4)
Chloride: 106 mEq/L (ref 98–109)
Creatinine: 1.1 mg/dL (ref 0.7–1.3)
EGFR: 84 mL/min/{1.73_m2} — ABNORMAL LOW (ref >90)
Glucose: 98 mg/dl (ref 70–140)
Potassium: 4.1 mEq/L (ref 3.5–5.1)
Sodium: 140 mEq/L (ref 136–145)
Total Bilirubin: 0.55 mg/dL (ref 0.20–1.20)
Total Protein: 7.4 g/dL (ref 6.4–8.3)

## 2015-02-20 LAB — LACTATE DEHYDROGENASE (CC13): LDH: 212 U/L (ref 125–245)

## 2015-02-20 LAB — IRON AND TIBC CHCC
%SAT: 16 % — ABNORMAL LOW (ref 20–55)
IRON: 50 ug/dL (ref 42–163)
TIBC: 314 ug/dL (ref 202–409)
UIBC: 263 ug/dL (ref 117–376)

## 2015-02-21 ENCOUNTER — Encounter: Payer: Self-pay | Admitting: Internal Medicine

## 2015-02-21 ENCOUNTER — Ambulatory Visit (INDEPENDENT_AMBULATORY_CARE_PROVIDER_SITE_OTHER): Payer: Managed Care, Other (non HMO) | Admitting: Internal Medicine

## 2015-02-21 VITALS — BP 144/88 | HR 79 | Temp 97.8°F | Wt 286.0 lb

## 2015-02-21 DIAGNOSIS — J069 Acute upper respiratory infection, unspecified: Secondary | ICD-10-CM

## 2015-02-21 MED ORDER — HYDROCODONE-HOMATROPINE 5-1.5 MG/5ML PO SYRP: 5.0000 mL | ORAL_SOLUTION | Freq: Three times a day (TID) | ORAL | Status: DC | PRN

## 2015-02-21 MED ORDER — DOXYCYCLINE HYCLATE 100 MG PO TABS: 100.0000 mg | ORAL_TABLET | Freq: Two times a day (BID) | ORAL | Status: DC

## 2015-02-24 LAB — IGG, IGA, IGM
IgA: 218 mg/dL (ref 68–379)
IgG (Immunoglobin G), Serum: 1120 mg/dL (ref 650–1600)
IgM, Serum: 130 mg/dL (ref 41–251)

## 2015-02-24 LAB — KAPPA/LAMBDA LIGHT CHAINS
Kappa free light chain: 2.66 mg/dL — ABNORMAL HIGH (ref 0.33–1.94)
Kappa:Lambda Ratio: 1.56 (ref 0.26–1.65)
Lambda Free Lght Chn: 1.7 mg/dL (ref 0.57–2.63)

## 2015-02-24 LAB — BETA 2 MICROGLOBULIN, SERUM: Beta-2 Microglobulin: 1.96 mg/L (ref <=2.51)

## 2015-02-25 ENCOUNTER — Other Ambulatory Visit: Payer: Self-pay | Admitting: *Deleted

## 2015-02-25 MED ORDER — ALLOPURINOL 300 MG PO TABS: ORAL_TABLET | ORAL | Status: DC

## 2015-02-25 NOTE — Telephone Encounter (Signed)
Allopurinol refills sent to patient pharmacy

## 2015-02-27 ENCOUNTER — Encounter: Payer: Self-pay | Admitting: Internal Medicine

## 2015-02-27 ENCOUNTER — Telehealth: Payer: Self-pay | Admitting: Internal Medicine

## 2015-02-27 ENCOUNTER — Ambulatory Visit (HOSPITAL_BASED_OUTPATIENT_CLINIC_OR_DEPARTMENT_OTHER): Payer: 59 | Admitting: Internal Medicine

## 2015-02-27 VITALS — BP 149/72 | HR 73 | Temp 98.4°F | Resp 18 | Ht 69.0 in | Wt 295.7 lb

## 2015-02-27 DIAGNOSIS — D509 Iron deficiency anemia, unspecified: Secondary | ICD-10-CM | POA: Diagnosis not present

## 2015-02-27 DIAGNOSIS — C61 Malignant neoplasm of prostate: Secondary | ICD-10-CM

## 2015-02-27 DIAGNOSIS — D472 Monoclonal gammopathy: Secondary | ICD-10-CM | POA: Diagnosis not present

## 2015-02-27 NOTE — Telephone Encounter (Signed)
gave and printed appt sched and avs fo rpt for OCT. °

## 2015-02-27 NOTE — Telephone Encounter (Signed)
gave and printed appt sched and avs fo rpt for May JUNE Aug and OCT.

## 2015-02-27 NOTE — Progress Notes (Signed)
[place] [phone]   [phone]  OFFICE PROGRESS NOTE  [name], MD [address] [place] [address]  DIAGNOSIS:  1) monoclonal gammopathy of undetermined significance.  2) iron deficiency anemia.  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 Tresa Moore.   PRIOR THERAPY: Feraheme infusion x 2 doses, last dose was given in October of 2014.  CURRENT THERAPY: Observation.  INTERVAL HISTORY: Leonard [name] 69 y.o. male returns to the clinic today for 6 months follow up visit. He is feeling very well today with no significant fatigue or weakness. He has no significant change since his last visit 6 months ago. He denied having any bleeding issues he specifically no rectal bleeding. The patient denied having any significant nausea or vomiting. He denied having any significant chest pain, shortness of breath, cough or hemoptysis. He has a recent lab work including CBC, iron study and ferritin as well as myeloma panel and he is here for evaluation and discussion of his lab results.  MEDICAL HISTORY: Past Medical History  Diagnosis Date  . Type II or unspecified type diabetes mellitus without mention of complication, not stated as uncontrolled   . Gouty arthropathy   . Other and unspecified hyperlipidemia   . Unspecified essential hypertension   . OSA (obstructive sleep apnea)     USES CPAP MACHINE   . Diverticulosis of colon (without mention of hemorrhage) 09/05/2006    Colonoscopy   . Hx of colonic polyps 09/05/2006    Colonoscopy(ADENOMATOUS POLYP)  . Hiatal hernia 09/05/2006    EGD  . 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 GERD (gastroesophageal reflux disease) 07/26/2001    EGD(Chronic)  . Hemorrhoids   . Coronary artery disease   . Cancer      PROSTATE CANCER  . Iron deficiency anemia, unspecified     ALLERGIES:  is allergic to dicloxacillin and cayenne pepper.  MEDICATIONS:  Current Outpatient Prescriptions  Medication Sig Dispense Refill  . allopurinol (ZYLOPRIM) 300 MG tablet TAKE 1 TABLET BY MOUTH DAILY TO PREVENT GOUT 90 tablet 3  . amLODipine-benazepril (LOTREL) 10-20 MG per capsule Take 1 capsule by mouth every morning. 90 capsule 3  . atorvastatin (LIPITOR) 10 MG tablet Take 1 tablet (10 mg total) by mouth daily. 90 tablet 0  . doxycycline (VIBRA-TABS) 100 MG tablet Take 1 tablet (100 mg total) by mouth 2 (two) times daily. 20 tablet 0  . esomeprazole (NEXIUM) 40 MG capsule TAKE ONE CAPSULE BY MOUTH DAILY 90 capsule 3  . furosemide (LASIX) 40 MG tablet Take 40 mg by mouth every morning.     Marland Kitchen HYDROcodone-homatropine (HYCODAN) 5-1.5 MG/5ML syrup Take 5 mLs by mouth every 8 (eight) hours as needed for cough. 120 mL 0  . linagliptin (TRADJENTA) 5 MG TABS tablet Take 1 tablet (5 mg total) by mouth daily. 90 tablet 3  . nitroGLYCERIN (NITROSTAT) 0.4 MG SL tablet Place 0.4 mg under the tongue every 5 (five) minutes as needed for chest pain.    . tamsulosin (FLOMAX) 0.4 MG CAPS capsule     . valACYclovir (VALTREX) 500 MG tablet Take 1 tablet (500 mg total) by mouth 2 (two) times daily. 10 tablet 0   No current facility-administered medications for this visit.    SURGICAL HISTORY:  Past Surgical History  Procedure Laterality Date  . 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 Hemorrhoid surgery    . Robot assisted laparoscopic radical prostatectomy N/A 05/01/2014    Procedure: ROBOTIC ASSISTED LAPAROSCOPIC RADICAL PROSTATECTOMY WITH INDOCYANINE GREEN DYE;  Surgeon: Alexis Frock, MD;  Location: [facility];  Service: Urology;  Laterality: N/A;  . Lymphadenectomy Bilateral 05/01/2014    Procedure: LYMPHADENECTOMY;  Surgeon: Alexis Frock, MD;  Location: [facility];  Service: Urology;  Laterality:  Bilateral;    REVIEW OF SYSTEMS:  Constitutional: positive for fatigue Eyes: negative Ears, nose, mouth, throat, and face: negative Respiratory: negative Cardiovascular: negative Gastrointestinal: negative Genitourinary:negative Integument/breast: negative Hematologic/lymphatic: negative Musculoskeletal:negative Neurological: negative Behavioral/Psych: negative Endocrine: negative Allergic/Immunologic: negative   PHYSICAL EXAMINATION: General appearance: alert, cooperative, fatigued and no distress Head: Normocephalic, without obvious abnormality, atraumatic Neck: no adenopathy and thyroid not enlarged, symmetric, no tenderness/mass/nodules Lymph nodes: Cervical, supraclavicular, and axillary nodes normal. Resp: clear to auscultation bilaterally Back: symmetric, no curvature. ROM normal. No CVA tenderness. Cardio: regular rate and rhythm, S1, S2 normal, no murmur, click, rub or gallop GI: soft, non-tender; bowel sounds normal; no masses,  no organomegaly Extremities: extremities normal, atraumatic, no cyanosis or edema Neurologic: Alert and oriented X 3, normal strength and tone. Normal symmetric reflexes. Normal coordination and gait  ECOG PERFORMANCE STATUS: 1 - Symptomatic but completely ambulatory  Blood pressure 149/72, pulse 73, temperature 98.4 F (36.9 C), temperature source Oral, resp. rate 18, height 5\' 9"  (1.753 m), weight 295 lb 11.2 oz (134.129 kg), SpO2 100 %.  LABORATORY DATA: Lab Results  Component Value Date   WBC 8.9 02/20/2015   HGB 12.6* 02/20/2015   HCT 38.8 02/20/2015   MCV 92.4 02/20/2015   PLT 247 02/20/2015      Chemistry      Component Value Date/Time   NA 140 02/20/2015 1001   NA 141 12/19/2014 0926   K 4.1 02/20/2015 1001   K 4.1 12/19/2014 0926   CL 105 12/19/2014 0926   CL 104 04/17/2013 0857   CO2 26 02/20/2015 1001   CO2 26 12/19/2014 0926   BUN 15.2 02/20/2015 1001   BUN 16 12/19/2014 0926   CREATININE 1.1 02/20/2015 1001    CREATININE 1.06 12/19/2014 0926   CREATININE 0.99 05/02/2014 0449      Component Value Date/Time   CALCIUM 9.2 02/20/2015 1001   CALCIUM 9.8 12/19/2014 0926   ALKPHOS 81 02/20/2015 1001   ALKPHOS 75 12/19/2014 0926   AST 23 02/20/2015 1001   AST 15 12/19/2014 0926   ALT 28 02/20/2015 1001   ALT 16 12/19/2014 0926   BILITOT 0.55 02/20/2015 1001   BILITOT 0.7 12/19/2014 0926     Other lab studies: ferritin 64, serum iron 50, total iron-binding capacity 263 and iron saturation 16%. Free kappa light chain 2.66, free lambda light chain 1.70 with a kappa/lambda ratio of 1.56. Beta-2 microglobulin 1.96.  RADIOGRAPHIC STUDIES: No results found.  ASSESSMENT AND PLAN:  1) iron deficiency anemia most likely secondary to history of GI blood loss. He has a stable hemoglobin and hematocrit as well as iron study. I recommended for him to continue on observation for now.  2) Monoclonal gammopathy of undetermined significance: no significant evidence for disease progression. The patient will continue on observation for now.  3) prostate adenocarcinoma status post radical prostatectomy under the care of Dr. Tresa Moore. He will continue to followup with his urologist. He would come back for follow up  visit in 6 months with repeat CBC, iron study and ferritin. The patient was advised to call immediately if he has any concerning symptoms in the interval.  The patient voices understanding of current disease status and treatment options and is in agreement with the current care plan.  All questions were answered. The patient knows to call the clinic with any problems, questions or concerns. We can certainly see the patient much sooner if necessary.  Disclaimer: This note was dictated with voice recognition software. Similar sounding words can inadvertently be transcribed and may not be corrected upon review.

## 2015-03-14 NOTE — Patient Instructions (Signed)
Take doxycycline 100 mg twice daily for 10 days. Call if not better in 2 weeks or sooner if worse

## 2015-03-14 NOTE — Progress Notes (Signed)
   Subjective:    Patient ID: Leonard [name], male    DOB: 04-08-1946, 69 y.o.   MRN: [id number]  HPI  Patient has come down with an acute respiratory infection. No fever or shaking chills. Has malaise. Has had congestion and slight sore throat.    Review of Systems     Objective:   Physical Exam  TMs and pharynx are clear. Neck is supple. Chest clear to auscultation without rales or wheezing      Assessment & Plan:  Acute URI  Plan: Doxycycline 100 mg twice daily for 10 days. Call if not better in 2 weeks or sooner if worse

## 2015-03-21 ENCOUNTER — Ambulatory Visit: Payer: Federal, State, Local not specified - PPO | Admitting: Pulmonary Disease

## 2015-04-04 ENCOUNTER — Ambulatory Visit (INDEPENDENT_AMBULATORY_CARE_PROVIDER_SITE_OTHER): Payer: No Typology Code available for payment source | Admitting: Pulmonary Disease

## 2015-04-04 ENCOUNTER — Encounter: Payer: Self-pay | Admitting: Pulmonary Disease

## 2015-04-04 VITALS — BP 156/82 | HR 73 | Temp 97.9°F | Ht 69.25 in | Wt 295.4 lb

## 2015-04-04 DIAGNOSIS — G4733 Obstructive sleep apnea (adult) (pediatric): Secondary | ICD-10-CM | POA: Diagnosis not present

## 2015-04-04 NOTE — Assessment & Plan Note (Signed)
The patient continues to do well with his C Pap device, and is satisfied with his sleep and daytime alertness. He tells me that his machine is at least 68 years old or more, and wants to consider getting a new device. He is also overdue for a new mask and headgear. We'll send the appropriate order to his home care company, and I've encouraged him to work aggressively on weight loss.

## 2015-04-04 NOTE — Progress Notes (Signed)
   Subjective:    Patient ID: Leonard [name], male    DOB: 1946/05/22, 68 y.o.   MRN: [id number]  HPI The patient comes in today for follow-up of his obstructive sleep apnea. He is wearing his device compliantly, and feels that he sleeps well with adequate daytime alertness. He is asking about a new device, and is overdue for new supplies and mask. Of note, his weight is down 2 pounds from the last visit.   Review of Systems  Constitutional: Negative for fever and unexpected weight change.  HENT: Negative for congestion, dental problem, ear pain, nosebleeds, postnasal drip, rhinorrhea, sinus pressure, sneezing, sore throat and trouble swallowing.   Eyes: Negative for redness and itching.  Respiratory: Negative for cough, chest tightness, shortness of breath and wheezing.   Cardiovascular: Negative for palpitations and leg swelling.  Gastrointestinal: Negative for nausea and vomiting.  Genitourinary: Negative for dysuria.  Musculoskeletal: Negative for joint swelling.  Skin: Negative for rash.  Neurological: Negative for headaches.  Hematological: Does not bruise/bleed easily.  Psychiatric/Behavioral: Negative for dysphoric mood. The patient is not nervous/anxious.        Objective:   Physical Exam Obese male in no acute distress Nose without purulence or discharge noted Neck without lymphadenopathy or thyromegaly No skin breakdown or pressure necrosis from the C Pap mask Lower extremities with mild edema, no cyanosis Alert and oriented, moves all 4 extremities.       Assessment & Plan:

## 2015-04-04 NOTE — Patient Instructions (Signed)
Will see if insurance will cover a new machine. Will send order to your home care company for new supplies Work on weight loss followup with Dr. Halford Chessman in one year.

## 2015-04-17 ENCOUNTER — Encounter: Payer: Self-pay | Admitting: Internal Medicine

## 2015-04-17 ENCOUNTER — Telehealth: Payer: Self-pay | Admitting: Interventional Cardiology

## 2015-04-17 NOTE — Telephone Encounter (Signed)
ERROR

## 2015-04-18 ENCOUNTER — Ambulatory Visit: Payer: Medicare Other | Admitting: Interventional Cardiology

## 2015-05-15 ENCOUNTER — Encounter: Payer: Self-pay | Admitting: Interventional Cardiology

## 2015-06-02 ENCOUNTER — Other Ambulatory Visit: Payer: Self-pay | Admitting: Interventional Cardiology

## 2015-06-16 NOTE — Progress Notes (Signed)
Cardiology Office Note   Date:  06/19/2015   ID:  MERRILL VILLARRUEL, DOB 12-28-1945, MRN [id number]  PCP:  [name], MD  Cardiologist:   Dorris Carnes, MD   No chief complaint on file.  F?U of CAD and HTN   History of Present Illness: Leonard [name] is a 69 y.o. male with a history of HTN, minimal CAD at cath in  2007, sleep apnea and HL  I saw him in May 2015   When I saw him last he was about to undergo sugery for prostate CA He follows in pulm for sleep apnea Also seen by Mayme Genta for MGUS  And his prostate CA    Occasionally feels sharp pain in chest   Breathing is OK    Lipids in Feb LDL was 74  HDL 74    Current Outpatient Prescriptions  Medication Sig Dispense Refill  . allopurinol (ZYLOPRIM) 300 MG tablet TAKE 1 TABLET BY MOUTH DAILY TO PREVENT GOUT 90 tablet 3  . amLODipine-benazepril (LOTREL) 10-20 MG per capsule Take 1 capsule by mouth every morning. 90 capsule 3  . aspirin 81 MG tablet Take 81 mg by mouth daily.    Marland Kitchen atorvastatin (LIPITOR) 10 MG tablet TAKE 1 TABLET BY MOUTH EVERY DAY 90 tablet 3  . doxycycline (VIBRA-TABS) 100 MG tablet Take 1 tablet (100 mg total) by mouth 2 (two) times daily. 20 tablet 0  . esomeprazole (NEXIUM) 40 MG capsule TAKE ONE CAPSULE BY MOUTH DAILY 90 capsule 3  . linagliptin (TRADJENTA) 5 MG TABS tablet Take 1 tablet (5 mg total) by mouth daily. 90 tablet 3  . nitroGLYCERIN (NITROSTAT) 0.4 MG SL tablet Place 0.4 mg under the tongue every 5 (five) minutes as needed for chest pain.     No current facility-administered medications for this visit.    Allergies:   Dicloxacillin and Cayenne pepper   Past Medical History  Diagnosis Date  . Type II or unspecified type diabetes mellitus without mention of complication, not stated as uncontrolled   . Gouty arthropathy   . Other and unspecified hyperlipidemia   . Unspecified essential hypertension   . OSA (obstructive sleep apnea)     USES CPAP MACHINE   . Diverticulosis of colon (without  mention of hemorrhage) 09/05/2006    Colonoscopy   . Hx of colonic polyps 09/05/2006    Colonoscopy(ADENOMATOUS POLYP)  . Hiatal hernia 09/05/2006    EGD  . 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 GERD (gastroesophageal reflux disease) 07/26/2001    EGD(Chronic)  . Hemorrhoids   . Coronary artery disease   . Cancer     PROSTATE CANCER  . Iron deficiency anemia, unspecified     Past Surgical History  Procedure Laterality Date  . 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 Hemorrhoid surgery    . Robot assisted laparoscopic radical prostatectomy N/A 05/01/2014    Procedure: ROBOTIC ASSISTED LAPAROSCOPIC RADICAL PROSTATECTOMY WITH INDOCYANINE GREEN DYE;  Surgeon: Alexis Frock, MD;  Location: [facility];  Service: Urology;  Laterality: N/A;  . Lymphadenectomy Bilateral 05/01/2014    Procedure: LYMPHADENECTOMY;  Surgeon: Alexis Frock, MD;  Location: [facility];  Service: Urology;  Laterality: Bilateral;     Social History:  The patient  reports that he has never smoked. He has never used smokeless tobacco. He reports that he does not drink alcohol or use illicit drugs.  Family History:  The patient's family history includes Diabetes in his brother and mother; Heart attack in his father.    ROS:  Please see the history of present illness. All other systems are reviewed and  Negative to the above problem except as noted.    PHYSICAL EXAM: VS:  BP 142/80 mmHg  Pulse 79  Ht 5\' 9"  (1.753 m)  Wt 291 lb 12.8 oz (132.36 kg)  BMI 43.07 kg/m2  GEN: Well nourished, well developed, in no acute distress HEENT: normal Neck: no JVD, carotid bruits, or masses Cardiac: RRR; no murmurs, rubs, or gallops,no edema  Respiratory:  clear to auscultation bilaterally, normal work of breathing GI: soft, nontender, nondistended, + BS  No hepatomegaly  MS: no deformity Moving all extremities   Skin: warm and dry, no rash Neuro:  Strength and  sensation are intact Psych: euthymic mood, full affect   EKG:  EKG is ordered today.  SR 79 bpm  LVH     Lipid Panel    Component Value Date/Time   CHOL 161 12/19/2014 0926   TRIG 67 12/19/2014 0926   HDL 74 12/19/2014 0926   CHOLHDL 2.2 12/19/2014 0926   VLDL 13 12/19/2014 0926   LDLCALC 74 12/19/2014 0926      Wt Readings from Last 3 Encounters:  06/19/15 291 lb 12.8 oz (132.36 kg)  04/04/15 295 lb 6.4 oz (133.993 kg)  02/27/15 295 lb 11.2 oz (134.129 kg)      ASSESSMENT AND PLAN: 1  HTN  BP has been high at other doctors visits  I would keep on same regimen    2.  Hx CAD  No symptoms of angina  Continue to follow   3.  HL  Lipids to be done today.  4.  Obesity  Keeping up with wt loss attempts    F/U in 1 year    Signed, Dorris Carnes, MD  06/19/2015 8:26 AM    [address], [place], [place]  [address] Phone: [phone]; Fax: [phone]

## 2015-06-19 ENCOUNTER — Encounter: Payer: Self-pay | Admitting: Internal Medicine

## 2015-06-19 ENCOUNTER — Other Ambulatory Visit: Payer: Managed Care, Other (non HMO) | Admitting: Internal Medicine

## 2015-06-19 ENCOUNTER — Ambulatory Visit (INDEPENDENT_AMBULATORY_CARE_PROVIDER_SITE_OTHER): Payer: No Typology Code available for payment source | Admitting: Internal Medicine

## 2015-06-19 VITALS — BP 142/80 | HR 79 | Ht 69.0 in | Wt 291.8 lb

## 2015-06-19 DIAGNOSIS — E785 Hyperlipidemia, unspecified: Secondary | ICD-10-CM

## 2015-06-19 DIAGNOSIS — E118 Type 2 diabetes mellitus with unspecified complications: Secondary | ICD-10-CM | POA: Diagnosis not present

## 2015-06-19 DIAGNOSIS — I10 Essential (primary) hypertension: Secondary | ICD-10-CM

## 2015-06-19 LAB — BASIC METABOLIC PANEL
BUN: 14 mg/dL (ref 6–23)
CHLORIDE: 104 meq/L (ref 96–112)
CO2: 29 mEq/L (ref 19–32)
Calcium: 9.5 mg/dL (ref 8.4–10.5)
Creatinine, Ser: 1.02 mg/dL (ref 0.40–1.50)
GFR: 93.47 mL/min (ref >60.00)
Glucose, Bld: 106 mg/dL — ABNORMAL HIGH (ref 70–99)
POTASSIUM: 3.9 meq/L (ref 3.5–5.1)
SODIUM: 139 meq/L (ref 135–145)

## 2015-06-19 LAB — LIPID PANEL
CHOL/HDL RATIO: 2
Cholesterol: 148 mg/dL (ref 0–200)
HDL: 73.7 mg/dL (ref >39.00)
LDL CALC: 64 mg/dL (ref 0–99)
NonHDL: 74.28
Triglycerides: 50 mg/dL (ref 0.0–149.0)
VLDL: 10 mg/dL (ref 0.0–40.0)

## 2015-06-19 LAB — HEMOGLOBIN A1C: HEMOGLOBIN A1C: 6.4 % (ref 4.6–6.5)

## 2015-06-19 LAB — HEPATIC FUNCTION PANEL
ALK PHOS: 77 U/L (ref 39–117)
ALT: 15 U/L (ref 0–53)
AST: 15 U/L (ref 0–37)
Albumin: 4.1 g/dL (ref 3.5–5.2)
Bilirubin, Direct: 0.1 mg/dL (ref 0.0–0.3)
TOTAL PROTEIN: 7.5 g/dL (ref 6.0–8.3)
Total Bilirubin: 0.4 mg/dL (ref 0.2–1.2)

## 2015-06-19 MED ORDER — TRIAMTERENE-HCTZ 37.5-25 MG PO TABS: 0.5000 | ORAL_TABLET | Freq: Every day | ORAL | Status: DC

## 2015-06-19 NOTE — Patient Instructions (Signed)
Medication Instructions:  Your physician has recommended you make the following change in your medication:  START Maxzide 37.5-25mg  (1/2 tablet) daily an Rx has bee sent  To your pharmacy   Labwork: Bmet today  Testing/Procedures: None   Follow-Up: Your physician recommends that you schedule a follow-up appointment in:4-6 weeks for a nurse BP check a day Dr.Ross is in the office  Any Other Special Instructions Will Be Listed Below (If Applicable).

## 2015-06-20 ENCOUNTER — Encounter: Payer: Self-pay | Admitting: Internal Medicine

## 2015-06-20 ENCOUNTER — Ambulatory Visit (INDEPENDENT_AMBULATORY_CARE_PROVIDER_SITE_OTHER): Payer: Managed Care, Other (non HMO) | Admitting: Internal Medicine

## 2015-06-20 VITALS — BP 130/72 | HR 77 | Temp 97.4°F | Wt 293.0 lb

## 2015-06-20 DIAGNOSIS — D472 Monoclonal gammopathy: Secondary | ICD-10-CM

## 2015-06-20 DIAGNOSIS — Z8546 Personal history of malignant neoplasm of prostate: Secondary | ICD-10-CM

## 2015-06-20 DIAGNOSIS — E119 Type 2 diabetes mellitus without complications: Secondary | ICD-10-CM

## 2015-06-20 DIAGNOSIS — Z8719 Personal history of other diseases of the digestive system: Secondary | ICD-10-CM

## 2015-06-20 DIAGNOSIS — R1013 Epigastric pain: Secondary | ICD-10-CM

## 2015-06-20 DIAGNOSIS — Z8679 Personal history of other diseases of the circulatory system: Secondary | ICD-10-CM

## 2015-06-20 DIAGNOSIS — E8881 Metabolic syndrome: Secondary | ICD-10-CM | POA: Diagnosis not present

## 2015-06-20 DIAGNOSIS — I10 Essential (primary) hypertension: Secondary | ICD-10-CM

## 2015-06-20 DIAGNOSIS — E785 Hyperlipidemia, unspecified: Secondary | ICD-10-CM | POA: Diagnosis not present

## 2015-06-20 DIAGNOSIS — G4733 Obstructive sleep apnea (adult) (pediatric): Secondary | ICD-10-CM

## 2015-06-20 NOTE — Progress Notes (Signed)
   Subjective:    Patient ID: Leonard [name], male    DOB: Nov 16, 1945, 69 y.o.   MRN: [id number]  HPI  Had large vascular gastric polyp removed 2014  at [facility]  after being referred by Dr. Sharlett Iles. Has been on generic Nexium. Now having some epigastric pain intermittent. No association with any foods he is aware of. Does not awaken him from sleep. Does not drink ETOH  anymore. 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 Polyp found during anemia workup. Should return to GI for evaluation.  Hx prostate cancer followed by Dr. Tresa Moore. S/p robotic prostatectomy.  Dr. Harrington Challenger saw him yesterday. Has not started Maxzide yet was just prescibed yesterday. History of hypertension.  History of uncontrolled type 2 diabetes mellitus and hemoglobin A1c is 6.4%.  Needs to stay with weight loss regimen.  Has bilateral knee pain  Saw Dr. Edilia Bo 2 weeks ago for eye exam  History of hyperlipidemia, obstructive sleep apnea, coronary artery disease, metabolic syndrome, microalbuminuria.  History of benign monoclonal gammopathy.      Review of Systems     Objective:   Physical Exam Mild epigastric tenderness. Neck is supple without JVD thyromegaly or carotid bruits. Chest clear. Cardiac exam regular rate and rhythm normal S1 and S2. Extremities without edema.       Assessment & Plan:   Morbid obesity-needs to diet exercise and lose weight once again counseled regarding this  Essential hypertension-has not started Maxide prescribe a Dr. Harrington Challenger yesterday  History of heart disease  Hyperlipidemia  Benign monoclonal gammopathy  History of gastric polyp  GE reflux  Microalbuminuria  History of prostate cancer  Obstructive sleep apnea  Metabolic syndrome  Controlled type 2 diabetes mellitus-needs to watch diet  Plan: Continue same medications and return in 6 months for physical exam

## 2015-06-21 LAB — MICROALBUMIN / CREATININE URINE RATIO
Creatinine, Urine: 74.5 mg/dL
Microalb Creat Ratio: 21.5 mg/g (ref 0.0–30.0)
Microalb, Ur: 1.6 mg/dL (ref <2.0)

## 2015-06-23 ENCOUNTER — Encounter: Payer: Self-pay | Admitting: *Deleted

## 2015-07-15 ENCOUNTER — Encounter: Payer: Self-pay | Admitting: Internal Medicine

## 2015-07-15 ENCOUNTER — Other Ambulatory Visit (INDEPENDENT_AMBULATORY_CARE_PROVIDER_SITE_OTHER): Payer: No Typology Code available for payment source

## 2015-07-15 ENCOUNTER — Ambulatory Visit (INDEPENDENT_AMBULATORY_CARE_PROVIDER_SITE_OTHER): Payer: No Typology Code available for payment source | Admitting: Internal Medicine

## 2015-07-15 VITALS — BP 130/66 | HR 76 | Ht 67.5 in | Wt 293.5 lb

## 2015-07-15 DIAGNOSIS — R1013 Epigastric pain: Secondary | ICD-10-CM

## 2015-07-15 DIAGNOSIS — Z8719 Personal history of other diseases of the digestive system: Secondary | ICD-10-CM

## 2015-07-15 DIAGNOSIS — Z8639 Personal history of other endocrine, nutritional and metabolic disease: Secondary | ICD-10-CM

## 2015-07-15 LAB — COMPREHENSIVE METABOLIC PANEL
ALBUMIN: 4 g/dL (ref 3.5–5.2)
ALK PHOS: 72 U/L (ref 39–117)
ALT: 14 U/L (ref 0–53)
AST: 13 U/L (ref 0–37)
BILIRUBIN TOTAL: 0.4 mg/dL (ref 0.2–1.2)
BUN: 15 mg/dL (ref 6–23)
CO2: 31 mEq/L (ref 19–32)
CREATININE: 1.05 mg/dL (ref 0.40–1.50)
Calcium: 9.4 mg/dL (ref 8.4–10.5)
Chloride: 105 mEq/L (ref 96–112)
GFR: 90.37 mL/min (ref >60.00)
GLUCOSE: 99 mg/dL (ref 70–99)
POTASSIUM: 4 meq/L (ref 3.5–5.1)
SODIUM: 141 meq/L (ref 135–145)
TOTAL PROTEIN: 7.4 g/dL (ref 6.0–8.3)

## 2015-07-15 LAB — CBC WITH DIFFERENTIAL/PLATELET
BASOS ABS: 0 10*3/uL (ref 0.0–0.1)
Basophils Relative: 0.4 % (ref 0.0–3.0)
EOS PCT: 1.3 % (ref 0.0–5.0)
Eosinophils Absolute: 0.1 10*3/uL (ref 0.0–0.7)
HCT: 37.6 % — ABNORMAL LOW (ref 39.0–52.0)
HEMOGLOBIN: 12.4 g/dL — AB (ref 13.0–17.0)
Lymphocytes Relative: 31.9 % (ref 12.0–46.0)
Lymphs Abs: 3.2 10*3/uL (ref 0.7–4.0)
MCHC: 33 g/dL (ref 30.0–36.0)
MCV: 91.7 fl (ref 78.0–100.0)
MONO ABS: 0.3 10*3/uL (ref 0.1–1.0)
MONOS PCT: 3.4 % (ref 3.0–12.0)
Neutro Abs: 6.3 10*3/uL (ref 1.4–7.7)
Neutrophils Relative %: 63 % (ref 43.0–77.0)
Platelets: 265 10*3/uL (ref 150.0–400.0)
RBC: 4.1 Mil/uL — AB (ref 4.22–5.81)
RDW: 15.4 % (ref 11.5–15.5)
WBC: 10 10*3/uL (ref 4.0–10.5)

## 2015-07-15 LAB — LIPASE: LIPASE: 16 U/L (ref 11.0–59.0)

## 2015-07-15 MED ORDER — TRAMADOL HCL 50 MG PO TABS: 50.0000 mg | ORAL_TABLET | Freq: Four times a day (QID) | ORAL | Status: DC | PRN

## 2015-07-15 NOTE — Patient Instructions (Signed)
Your physician has requested that you go to the basement for the following lab work before leaving today: cbc, cmp, lipase.  We have sent the following medications to your pharmacy for you to pick up at your convenience: Tramadol 50 mg every 6 hours as needed for pain.  You have been scheduled for a CT scan of the abdomen and pelvis at [facility] (1st floor radiology).   You are scheduled on 07/18/15 at 12 noon. You should arrive 15 minutes prior to your appointment time for registration. Please follow the written instructions below on the day of your exam:  WARNING: IF YOU ARE ALLERGIC TO IODINE/X-RAY DYE, PLEASE NOTIFY RADIOLOGY IMMEDIATELY AT [phone]! YOU WILL BE GIVEN A 13 HOUR PREMEDICATION PREP.  1) Do not eat or drink anything after 8 am (4 hours prior to your test) 2) You have been given 2 bottles of oral contrast to drink. The solution may taste better if refrigerated, but do NOT add ice or any other liquid to this solution. Shake  well before drinking.    Drink 1 bottle of contrast @ 10 am (2 hours prior to your exam)  Drink 1 bottle of contrast @ 11 am (1 hour prior to your exam)  You may take any medications as prescribed with a small amount of water.  The purpose of you drinking the oral contrast is to aid in the visualization of your intestinal tract. The contrast solution may cause some diarrhea. Before your exam is started, you will be given a small amount of fluid to drink. Depending on your individual set of symptoms, you may also receive an intravenous injection of x-ray contrast/dye. Plan on being at [facility] for 30 minutes or longer, depending on the type of exam you are having performed.  This test typically takes 30-45 minutes to complete.  If you have any questions regarding your exam or if you need to reschedule, you may call the CT department at [phone] between the hours of 8:00 am and 5:00 pm,  Monday-Friday.  ________________________________________________________________________

## 2015-07-15 NOTE — Progress Notes (Signed)
Patient ID: Leonard [name], male   DOB: 1946-05-01, 69 y.o.   MRN: [id number] HPI: Leonard [name] is a 69 yo male with PMH of hyperplastic gastric polyp status post resection at [facility], history of colon polyps, GERD, type 2 diabetes, OSA, prostate cancer who is seen at the request of Dr. Renold Genta to evaluate epigastric and upper abdominal pain. He is here alone today. He has a history of an iron deficiency anemia that was evaluated by Dr. Sharlett Iles. A large gastric polyp was found at upper endoscopy and he was referred to [facility] for resection. This was performed by Dr. Stephanie Acre on 10/03/2013. This is a 20 mm pedunculated polyp on the incisura. The polyp was removed with hot snare. The esophagus and duodenum were normal. Pathology results indicated hyperplastic type polyp, favor gastric prolapse polyp. No dysplasia. Since resection his anemia has improved. He returns today because he has had 6 weeks of upper abdominal pain. This is intermittent, comes and goes and is sharp in nature. He associates this with chills. Has become so intense that he has to lie down. No known aggravating or alleviating factors. Pain does not radiate. Does not seem to be associated with eating. Is not associated with nausea or vomiting. Reports bowel movements are regular without diarrhea, constipation. No blood in his stool or melena. Episodes of abdominal pain last a few hours and are occurring 1-2 days per week. Appetite and ability to be has not been impaired. He denies heartburn, dysphagia and odynophagia. He continues on Nexium 40 mg daily. He takes allopurinol for gout. He is on a baby aspirin daily. He also uses Tradjenta for diabetes, Lipitor for hypercholesterolemia and Lotrel and triamterene hydrochlorothiazide for blood pressure.  Past Medical History  Diagnosis Date  . Type II or unspecified type diabetes mellitus without mention of complication, not stated as uncontrolled   . Gouty arthropathy   . Other and unspecified hyperlipidemia    . Unspecified essential hypertension   . OSA (obstructive sleep apnea)     USES CPAP MACHINE   . Diverticulosis of colon (without mention of hemorrhage) 09/05/2006    Colonoscopy   . Hx of colonic polyps 09/05/2006    Colonoscopy(ADENOMATOUS POLYP)  . Hiatal hernia 09/05/2006    EGD  . 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 GERD (gastroesophageal reflux disease) 07/26/2001    EGD(Chronic)  . Hemorrhoids   . Coronary artery disease   . Prostate cancer 2015  . Iron deficiency anemia, unspecified   . Gastric polyp 2014    done at [facility]    Past Surgical History  Procedure Laterality Date  . 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 Hemorrhoid surgery    . Robot assisted laparoscopic radical prostatectomy N/A 05/01/2014    Procedure: ROBOTIC ASSISTED LAPAROSCOPIC RADICAL PROSTATECTOMY WITH INDOCYANINE GREEN DYE;  Surgeon: Alexis Frock, MD;  Location: [facility];  Service: Urology;  Laterality: N/A;  . Lymphadenectomy Bilateral 05/01/2014    Procedure: LYMPHADENECTOMY;  Surgeon: Alexis Frock, MD;  Location: [facility];  Service: Urology;  Laterality: Bilateral;  . Esophagogastroduodenoscopy      Done at [facility]  . Gi prostate biopsy      Outpatient Prescriptions Prior to Visit  Medication Sig Dispense Refill  . allopurinol (ZYLOPRIM) 300 MG tablet TAKE 1 TABLET BY MOUTH DAILY TO PREVENT GOUT 90 tablet 3  . amLODipine-benazepril (LOTREL) 10-20 MG per capsule Take 1 capsule by  mouth every morning. 90 capsule 3  . aspirin 81 MG tablet Take 81 mg by mouth daily.    Marland Kitchen atorvastatin (LIPITOR) 10 MG tablet TAKE 1 TABLET BY MOUTH EVERY DAY 90 tablet 3  . esomeprazole (NEXIUM) 40 MG capsule TAKE ONE CAPSULE BY MOUTH DAILY 90 capsule 3  . linagliptin (TRADJENTA) 5 MG TABS tablet Take 1 tablet (5 mg total) by mouth daily. 90 tablet 3  . nitroGLYCERIN (NITROSTAT) 0.4 MG SL tablet Place 0.4 mg under the tongue every 5 (five) minutes as needed  for chest pain.    Marland Kitchen triamterene-hydrochlorothiazide (MAXZIDE-25) 37.5-25 MG per tablet Take 0.5 tablets by mouth daily. 15 tablet 11  . doxycycline (VIBRA-TABS) 100 MG tablet Take 1 tablet (100 mg total) by mouth 2 (two) times daily. 20 tablet 0   No facility-administered medications prior to visit.    Allergies  Allergen Reactions  . Dicloxacillin Rash  . Cayenne Pepper [Cayenne] Swelling    Family History  Problem Relation Age of Onset  . Heart attack Father   . Diabetes Mother   . Diabetes Brother     Social History  Substance Use Topics  . Smoking status: Never Smoker   . Smokeless tobacco: Never Used  . Alcohol Use: No    ROS: As per history of present illness, otherwise negative  BP 130/66 mmHg  Pulse 76  Ht 5' 7.5" (1.715 m)  Wt 293 lb 8 oz (133.131 kg)  BMI 45.26 kg/m2 Constitutional: Well-developed and well-nourished. No distress. HEENT: Normocephalic and atraumatic. Oropharynx is clear and moist. No oropharyngeal exudate. Conjunctivae are normal.  No scleral icterus. Neck: Neck supple. Trachea midline. Cardiovascular: Normal rate, regular rhythm and intact distal pulses. No M/R/G Pulmonary/chest: Effort normal and breath sounds normal. No wheezing, rales or rhonchi. Abdominal: Soft, obese, nontender, nondistended. Bowel sounds active throughout.  Extremities: no clubbing, cyanosis, or edema Lymphadenopathy: No cervical adenopathy noted. Neurological: Alert and oriented to person place and time. Skin: Skin is warm and dry. No rashes noted. Psychiatric: Normal mood and affect. Behavior is normal.  RELEVANT LABS AND IMAGING: CBC    Component Value Date/Time   WBC 10.0 07/15/2015 1127   WBC 8.9 02/20/2015 1001   RBC 4.10* 07/15/2015 1127   RBC 4.20 02/20/2015 1001   RBC 4.23 09/01/2012 1140   HGB 12.4* 07/15/2015 1127   HGB 12.6* 02/20/2015 1001   HCT 37.6* 07/15/2015 1127   HCT 38.8 02/20/2015 1001   PLT 265.0 07/15/2015 1127   PLT 247 02/20/2015  1001   MCV 91.7 07/15/2015 1127   MCV 92.4 02/20/2015 1001   MCH 30.0 02/20/2015 1001   MCH 30.3 12/19/2014 0926   MCHC 33.0 07/15/2015 1127   MCHC 32.5 02/20/2015 1001   RDW 15.4 07/15/2015 1127   RDW 14.7* 02/20/2015 1001   LYMPHSABS 3.2 07/15/2015 1127   LYMPHSABS 3.2 02/20/2015 1001   MONOABS 0.3 07/15/2015 1127   MONOABS 0.6 02/20/2015 1001   EOSABS 0.1 07/15/2015 1127   EOSABS 0.1 02/20/2015 1001   BASOSABS 0.0 07/15/2015 1127   BASOSABS 0.0 02/20/2015 1001    CMP     Component Value Date/Time   NA 141 07/15/2015 1127   NA 140 02/20/2015 1001   K 4.0 07/15/2015 1127   K 4.1 02/20/2015 1001   CL 105 07/15/2015 1127   CL 104 04/17/2013 0857   CO2 31 07/15/2015 1127   CO2 26 02/20/2015 1001   GLUCOSE 99 07/15/2015 1127   GLUCOSE 98 02/20/2015 1001  GLUCOSE 105* 04/17/2013 0857   BUN 15 07/15/2015 1127   BUN 15.2 02/20/2015 1001   CREATININE 1.05 07/15/2015 1127   CREATININE 1.1 02/20/2015 1001   CREATININE 1.06 12/19/2014 0926   CALCIUM 9.4 07/15/2015 1127   CALCIUM 9.2 02/20/2015 1001   PROT 7.4 07/15/2015 1127   PROT 7.4 02/20/2015 1001   ALBUMIN 4.0 07/15/2015 1127   ALBUMIN 3.7 02/20/2015 1001   AST 13 07/15/2015 1127   AST 23 02/20/2015 1001   ALT 14 07/15/2015 1127   ALT 28 02/20/2015 1001   ALKPHOS 72 07/15/2015 1127   ALKPHOS 81 02/20/2015 1001   BILITOT 0.4 07/15/2015 1127   BILITOT 0.55 02/20/2015 1001   GFRNONAA 83* 05/02/2014 0449   GFRAA >90 05/02/2014 0449   Lipase     Component Value Date/Time   LIPASE 16.0 07/15/2015 1127     ASSESSMENT/PLAN: 69 yo male with PMH of hyperplastic gastric polyp status post resection at [facility], history of colon polyps, GERD, type 2 diabetes, OSA, prostate cancer who is seen at the request of Dr. Renold Genta to evaluate epigastric and upper abdominal pain  1. Epigastric pain -- episodic, unclear etiology. Does not seem to relate to eating and is not associated with nausea or vomiting. History of hyperplastic  gastric polyp resected at [facility] nearly 2 years ago. Previous workup negative for H. pylori. Polyp was not dysplastic nor was it causing abdominal pain prior to resection. I recommended cross-sectional imaging with CT scan abdomen and pelvis with contrast to further evaluate his pain. Gallbladder pain is possible but due to body habitus will start with CT rather than ultrasound. May require repeat upper endoscopy, await CT findings. Tramadol 50 mg every 6 hours as needed for pain only until workup complete. Liver enzymes normal today as is lipase.  2. Colorectal cancer screening -- normal colonoscopy other than left-sided diverticulosis in November 2013, repeat would be recommended in November 2023 for screening  3. History of IDA -- felt secondary to gastric polyp now resected. Hemoglobin and iron studies are stable and he is followed by Dr. Earlie Server with hematology.    FX:OVAN J [name], Md [address] [place], [place] [address]

## 2015-07-16 ENCOUNTER — Encounter: Payer: Self-pay | Admitting: Internal Medicine

## 2015-07-16 NOTE — Patient Instructions (Signed)
Please try to diet exercise and lose some weight. Please contact gastroenterologist regarding epigastric pain. Continue same medications.

## 2015-07-18 ENCOUNTER — Ambulatory Visit: Payer: No Typology Code available for payment source

## 2015-07-18 ENCOUNTER — Ambulatory Visit (HOSPITAL_COMMUNITY): Payer: No Typology Code available for payment source

## 2015-07-23 ENCOUNTER — Encounter: Payer: Self-pay | Admitting: Pharmacist

## 2015-07-23 ENCOUNTER — Ambulatory Visit (INDEPENDENT_AMBULATORY_CARE_PROVIDER_SITE_OTHER): Payer: No Typology Code available for payment source | Admitting: Pharmacist

## 2015-07-23 VITALS — BP 118/68 | HR 74 | Wt 290.0 lb

## 2015-07-23 DIAGNOSIS — I10 Essential (primary) hypertension: Secondary | ICD-10-CM

## 2015-07-23 DIAGNOSIS — E118 Type 2 diabetes mellitus with unspecified complications: Secondary | ICD-10-CM

## 2015-07-23 DIAGNOSIS — E785 Hyperlipidemia, unspecified: Secondary | ICD-10-CM | POA: Diagnosis not present

## 2015-07-23 LAB — BASIC METABOLIC PANEL
BUN: 18 mg/dL (ref 6–23)
CHLORIDE: 101 meq/L (ref 96–112)
CO2: 27 mEq/L (ref 19–32)
Calcium: 9.6 mg/dL (ref 8.4–10.5)
Creatinine, Ser: 1.17 mg/dL (ref 0.40–1.50)
GFR: 79.76 mL/min (ref >60.00)
GLUCOSE: 93 mg/dL (ref 70–99)
POTASSIUM: 4.1 meq/L (ref 3.5–5.1)
SODIUM: 136 meq/L (ref 135–145)

## 2015-07-23 NOTE — Progress Notes (Signed)
Patient ID: Leonard FJELD                  DOB: 2046/05/12, 69 yo                          MRN: [id number]     HPI: Leonard [name] is a 69 y.o. male referred by Dr. Harrington Challenger to HTN clinic for BP f/u after initiation of Maxzide 1 month ago. PMH is significant for HTN, minimal CAD on cath in 2007, sleep apnea, HLD, and prostate cancer. His blood pressure has mostly been at goal this year during clinic visits with readings < 150/18mmHg.   He was started on triamterene-HCTZ 37.5-25mg  1/2 tablet daily a month ago by Dr. Harrington Challenger. Patient reports that it was hard to split the tablet in half because it would crumble. He has been taking a full tablet every day and has not had any symptoms of dizziness or lightheadedness at home. He does not check his blood pressure at home.  Current HTN meds: amlodipine-benazepril 10-20mg  once daily, triamterene-HCTZ 37.5-25mg  once daily BP goal: <150/36mmHg  Family History: Diabetes in his brother and mother, heart attack in his father.  Social History: He has never smoked and does not drink alcohol or use illicit drugs.  Diet: He eats oatmeal for breakfast, chicken or sometimes steak for lunch, and had baked chicken and potato salad last night for dinner. He tries to avoid adding salt to his food.  Exercise: He walks 1/2-1 mile once a week and would like to increase this to 2-3x/week.   Wt Readings from Last 3 Encounters:  07/23/15 290 lb (131.543 kg)  07/15/15 293 lb 8 oz (133.131 kg)  06/20/15 293 lb (132.904 kg)   BP Readings from Last 3 Encounters:  07/23/15 118/68  07/15/15 130/66  06/20/15 130/72   Pulse Readings from Last 3 Encounters:  07/23/15 74  07/15/15 76  06/20/15 77    Renal function: Estimated Creatinine Clearance: 89.8 mL/min (by C-G formula based on Cr of 1.05).  Past Medical History  Diagnosis Date  . Type II or unspecified type diabetes mellitus without mention of complication, not stated as uncontrolled   . Gouty arthropathy   . Other  and unspecified hyperlipidemia   . Unspecified essential hypertension   . OSA (obstructive sleep apnea)     USES CPAP MACHINE   . Diverticulosis of colon (without mention of hemorrhage) 09/05/2006    Colonoscopy   . Hx of colonic polyps 09/05/2006    Colonoscopy(ADENOMATOUS POLYP)  . Hiatal hernia 09/05/2006    EGD  . 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 GERD (gastroesophageal reflux disease) 07/26/2001    EGD(Chronic)  . Hemorrhoids   . Coronary artery disease   . Prostate cancer 2015  . Iron deficiency anemia, unspecified   . Gastric polyp 2014    done at [facility]    Current Outpatient Prescriptions on File Prior to Visit  Medication Sig Dispense Refill  . allopurinol (ZYLOPRIM) 300 MG tablet TAKE 1 TABLET BY MOUTH DAILY TO PREVENT GOUT 90 tablet 3  . amLODipine-benazepril (LOTREL) 10-20 MG per capsule Take 1 capsule by mouth every morning. 90 capsule 3  . aspirin 81 MG tablet Take 81 mg by mouth daily.    Marland Kitchen atorvastatin (LIPITOR) 10 MG tablet TAKE 1 TABLET BY MOUTH EVERY DAY 90 tablet 3  . esomeprazole (NEXIUM) 40 MG capsule TAKE ONE CAPSULE  BY MOUTH DAILY 90 capsule 3  . FLUZONE HIGH-DOSE 0.5 ML SUSY inject 0.5 milliliter intramuscularly  0  . linagliptin (TRADJENTA) 5 MG TABS tablet Take 1 tablet (5 mg total) by mouth daily. 90 tablet 3  . nitroGLYCERIN (NITROSTAT) 0.4 MG SL tablet Place 0.4 mg under the tongue every 5 (five) minutes as needed for chest pain.    . traMADol (ULTRAM) 50 MG tablet Take 1 tablet (50 mg total) by mouth every 6 (six) hours as needed. 30 tablet 0  . triamterene-hydrochlorothiazide (MAXZIDE-25) 37.5-25 MG per tablet Take 0.5 tablets by mouth daily. (Patient taking differently: Take 1 tablet by mouth daily. ) 15 tablet 11   No current facility-administered medications on file prior to visit.    Allergies  Allergen Reactions  . Dicloxacillin Rash  . Cayenne Pepper [Cayenne] Swelling     Assessment/Plan:  1.  Hypertension - Patient's BP is at goal < 150/73mmHg with recent addition of Maxzide last month. Patient had been taking 1 tablet rather than 1/2 as prescribed because the tablets kept crumbling when he would try to split them in half. Since his BP is well controlled today in clinic and he has not had any complaints of dizziness or low blood pressure at home, patient will continue on amlodipine-benazepril 10-20mg  daily and triamterene-HCTZ 37.5-25 daily. Ordered BMET today to check electrolytes with recent addition of Maxzide - lytes wnl. Patient aware to continue with current regimen and he will work on increasing his walking to 2-3x/week.   Megan E. Supple, PharmD [place] [address] [place], [place] [address] Phone: [phone]; Fax: [phone] 07/23/2015 11:44 AM

## 2015-07-24 ENCOUNTER — Other Ambulatory Visit: Payer: Self-pay

## 2015-07-24 DIAGNOSIS — R1013 Epigastric pain: Secondary | ICD-10-CM

## 2015-07-25 ENCOUNTER — Telehealth: Payer: Self-pay

## 2015-07-25 NOTE — Telephone Encounter (Signed)
Spoke with pt and he is aware. 

## 2015-07-25 NOTE — Telephone Encounter (Signed)
Pts CT scan rescheduled at [facility] for 08/01/15@10am . Pt to be NPO after midnight, drink bottle 1 of contrast at 8am and bottle 2 at 9am. Left message for pt to call back.

## 2015-08-01 ENCOUNTER — Ambulatory Visit (INDEPENDENT_AMBULATORY_CARE_PROVIDER_SITE_OTHER)
Admission: RE | Admit: 2015-08-01 | Discharge: 2015-08-01 | Disposition: A | Discharge status: Home / Self Care | Payer: No Typology Code available for payment source | Source: Ambulatory Visit | Attending: Internal Medicine | Admitting: Internal Medicine

## 2015-08-01 DIAGNOSIS — R1013 Epigastric pain: Secondary | ICD-10-CM

## 2015-08-01 MED ORDER — IOHEXOL 300 MG/ML  SOLN
100.0000 mL | Freq: Once | INTRAMUSCULAR | Status: AC | PRN
  Administered 2015-08-01: 100 mL via INTRAVENOUS

## 2015-08-04 ENCOUNTER — Encounter: Payer: Self-pay | Admitting: Internal Medicine

## 2015-08-04 ENCOUNTER — Telehealth: Payer: Self-pay | Admitting: Internal Medicine

## 2015-08-04 ENCOUNTER — Encounter (HOSPITAL_BASED_OUTPATIENT_CLINIC_OR_DEPARTMENT_OTHER): Payer: Self-pay | Admitting: *Deleted

## 2015-08-04 ENCOUNTER — Emergency Department (HOSPITAL_BASED_OUTPATIENT_CLINIC_OR_DEPARTMENT_OTHER)
Admission: EM | Admit: 2015-08-04 | Discharge: 2015-08-04 | Disposition: A | Discharge status: Home / Self Care | Payer: No Typology Code available for payment source | Source: Home / Self Care | Attending: Emergency Medicine | Admitting: Emergency Medicine

## 2015-08-04 DIAGNOSIS — R1031 Right lower quadrant pain: Secondary | ICD-10-CM

## 2015-08-04 DIAGNOSIS — R112 Nausea with vomiting, unspecified: Secondary | ICD-10-CM | POA: Diagnosis not present

## 2015-08-04 DIAGNOSIS — Z6841 Body Mass Index (BMI) 40.0 and over, adult: Secondary | ICD-10-CM | POA: Diagnosis not present

## 2015-08-04 DIAGNOSIS — K5660 Unspecified intestinal obstruction: Secondary | ICD-10-CM | POA: Diagnosis not present

## 2015-08-04 DIAGNOSIS — N179 Acute kidney failure, unspecified: Secondary | ICD-10-CM | POA: Diagnosis not present

## 2015-08-04 DIAGNOSIS — I10 Essential (primary) hypertension: Secondary | ICD-10-CM | POA: Diagnosis not present

## 2015-08-04 DIAGNOSIS — K5669 Other intestinal obstruction: Secondary | ICD-10-CM | POA: Diagnosis not present

## 2015-08-04 DIAGNOSIS — J9811 Atelectasis: Secondary | ICD-10-CM | POA: Diagnosis not present

## 2015-08-04 DIAGNOSIS — R1013 Epigastric pain: Secondary | ICD-10-CM

## 2015-08-04 DIAGNOSIS — E119 Type 2 diabetes mellitus without complications: Secondary | ICD-10-CM | POA: Diagnosis not present

## 2015-08-04 LAB — COMPREHENSIVE METABOLIC PANEL
ALK PHOS: 73 U/L (ref 38–126)
ALT: 16 U/L — AB (ref 17–63)
AST: 18 U/L (ref 15–41)
Albumin: 4.1 g/dL (ref 3.5–5.0)
Anion gap: 10 (ref 5–15)
BILIRUBIN TOTAL: 0.6 mg/dL (ref 0.3–1.2)
BUN: 13 mg/dL (ref 6–20)
CALCIUM: 9.5 mg/dL (ref 8.9–10.3)
CHLORIDE: 101 mmol/L (ref 101–111)
CO2: 26 mmol/L (ref 22–32)
CREATININE: 0.99 mg/dL (ref 0.61–1.24)
GFR calc Af Amer: >60 mL/min (ref >60)
Glucose, Bld: 158 mg/dL — ABNORMAL HIGH (ref 65–99)
Potassium: 3.5 mmol/L (ref 3.5–5.1)
Sodium: 137 mmol/L (ref 135–145)
TOTAL PROTEIN: 8 g/dL (ref 6.5–8.1)

## 2015-08-04 LAB — CBC WITH DIFFERENTIAL/PLATELET
BASOS ABS: 0 10*3/uL (ref 0.0–0.1)
Basophils Relative: 0 %
Eosinophils Absolute: 0 10*3/uL (ref 0.0–0.7)
Eosinophils Relative: 0 %
HEMATOCRIT: 39.3 % (ref 39.0–52.0)
HEMOGLOBIN: 13 g/dL (ref 13.0–17.0)
LYMPHS ABS: 2.3 10*3/uL (ref 0.7–4.0)
LYMPHS PCT: 16 %
MCH: 30 pg (ref 26.0–34.0)
MCHC: 33.1 g/dL (ref 30.0–36.0)
MCV: 90.6 fL (ref 78.0–100.0)
Monocytes Absolute: 0.6 10*3/uL (ref 0.1–1.0)
Monocytes Relative: 5 %
NEUTROS ABS: 10.9 10*3/uL — AB (ref 1.7–7.7)
Neutrophils Relative %: 79 %
Platelets: 288 10*3/uL (ref 150–400)
RBC: 4.34 MIL/uL (ref 4.22–5.81)
RDW: 14.1 % (ref 11.5–15.5)
WBC: 13.8 10*3/uL — AB (ref 4.0–10.5)

## 2015-08-04 LAB — URINE MICROSCOPIC-ADD ON

## 2015-08-04 LAB — URINALYSIS, ROUTINE W REFLEX MICROSCOPIC
Bilirubin Urine: NEGATIVE
GLUCOSE, UA: NEGATIVE mg/dL
KETONES UR: NEGATIVE mg/dL
LEUKOCYTES UA: NEGATIVE
Nitrite: NEGATIVE
PH: 6.5 (ref 5.0–8.0)
Protein, ur: 100 mg/dL — AB
Specific Gravity, Urine: 1.024 (ref 1.005–1.030)
Urobilinogen, UA: 1 mg/dL (ref 0.0–1.0)

## 2015-08-04 LAB — TROPONIN I: Troponin I: <0.03 ng/mL (ref <0.031)

## 2015-08-04 LAB — LIPASE, BLOOD: LIPASE: 14 U/L — AB (ref 22–51)

## 2015-08-04 MED ORDER — OXYCODONE-ACETAMINOPHEN 5-325 MG PO TABS: 1.0000 | ORAL_TABLET | Freq: Four times a day (QID) | ORAL | Status: DC | PRN

## 2015-08-04 MED ORDER — MORPHINE SULFATE (PF) 4 MG/ML IV SOLN
4.0000 mg | Freq: Once | INTRAVENOUS | Status: AC
  Administered 2015-08-04: 4 mg via INTRAVENOUS
  Filled 2015-08-04: qty 1

## 2015-08-04 MED ORDER — ONDANSETRON HCL 4 MG/2ML IJ SOLN
4.0000 mg | Freq: Once | INTRAMUSCULAR | Status: AC
  Administered 2015-08-04: 4 mg via INTRAVENOUS
  Filled 2015-08-04: qty 2

## 2015-08-04 MED ORDER — ONDANSETRON 4 MG PO TBDP: 4.0000 mg | ORAL_TABLET | Freq: Three times a day (TID) | ORAL | Status: DC | PRN

## 2015-08-04 MED ORDER — PANTOPRAZOLE SODIUM 40 MG IV SOLR
40.0000 mg | Freq: Once | INTRAVENOUS | Status: AC
  Administered 2015-08-04: 40 mg via INTRAVENOUS
  Filled 2015-08-04: qty 40

## 2015-08-04 MED ORDER — GI COCKTAIL ~~LOC~~
30.0000 mL | Freq: Once | ORAL | Status: AC
  Administered 2015-08-04: 30 mL via ORAL
  Filled 2015-08-04: qty 30

## 2015-08-04 NOTE — Discharge Instructions (Signed)
You were seen in the emergency department for upper abdominal pain that has been present intermittently for the past 2 months. Please call your gastroenterologist for close follow-up. Your CT scan did not show any findings that would cause your upper abdominal pain but did show a fluid collection in the right lower side of your abdomen. I have discussed this finding with general surgery who has recommended that you follow-up with radiology to have an ultrasound-guided aspiration of this fluid for further evaluation. I have placed a referral to the radiologist. If you do not hear back from them in the next week, please contact your primary care provider.  Your recent CT scan showed a normal gallbladder, normal appendix and normal pancreas. Your labs today were unremarkable other than a mildly elevated white blood cell count. We are discharging you with a prescription for Percocet to take for pain as needed. Please do not take this with your tramadol. Please continue your Nexium every day. If you have worsening pain, new abdominal pain, vomiting and can't stop, blood in your stool or black and tarry stools, please return to the hospital.   Abdominal Pain Many things can cause abdominal pain. Usually, abdominal pain is not caused by a disease and will improve without treatment. It can often be observed and treated at home. Your health care provider will do a physical exam and possibly order blood tests and X-rays to help determine the seriousness of your pain. However, in many cases, more time must pass before a clear cause of the pain can be found. Before that point, your health care provider may not know if you need more testing or further treatment. HOME CARE INSTRUCTIONS  Monitor your abdominal pain for any changes. The following actions may help to alleviate any discomfort you are experiencing:  Only take over-the-counter or prescription medicines as directed by your health care provider.  Do not take  laxatives unless directed to do so by your health care provider.  Try a clear liquid diet (broth, tea, or water) as directed by your health care provider. Slowly move to a bland diet as tolerated. SEEK MEDICAL CARE IF:  You have unexplained abdominal pain.  You have abdominal pain associated with nausea or diarrhea.  You have pain when you urinate or have a bowel movement.  You experience abdominal pain that wakes you in the night.  You have abdominal pain that is worsened or improved by eating food.  You have abdominal pain that is worsened with eating fatty foods.  You have a fever. SEEK IMMEDIATE MEDICAL CARE IF:   Your pain does not go away within 2 hours.  You keep throwing up (vomiting).  Your pain is felt only in portions of the abdomen, such as the right side or the left lower portion of the abdomen.  You pass bloody or black tarry stools. MAKE SURE YOU:  Understand these instructions.   Will watch your condition.   Will get help right away if you are not doing well or get worse.  Document Released: 08/11/2005 Document Revised: 11/06/2013 Document Reviewed: 07/11/2013 [facility] Patient Information 2015 [place], [place]. This information is not intended to replace advice given to you by your health care provider. Make sure you discuss any questions you have with your health care provider.   Possible Gastritis, Adult Gastritis is soreness and swelling (inflammation) of the lining of the stomach. Gastritis can develop as a sudden onset (acute) or long-term (chronic) condition. If gastritis is not treated, it  can lead to stomach bleeding and ulcers. CAUSES  Gastritis occurs when the stomach lining is weak or damaged. Digestive juices from the stomach then inflame the weakened stomach lining. The stomach lining may be weak or damaged due to viral or bacterial infections. One common bacterial infection is the Helicobacter pylori infection. Gastritis can also result from  excessive alcohol consumption, taking certain medicines, or having too much acid in the stomach.  SYMPTOMS  In some cases, there are no symptoms. When symptoms are present, they may include:  Pain or a burning sensation in the upper abdomen.  Nausea.  Vomiting.  An uncomfortable feeling of fullness after eating. DIAGNOSIS  Your caregiver may suspect you have gastritis based on your symptoms and a physical exam. To determine the cause of your gastritis, your caregiver may perform the following:  Blood or stool tests to check for the H pylori bacterium.  Gastroscopy. A thin, flexible tube (endoscope) is passed down the esophagus and into the stomach. The endoscope has a light and camera on the end. Your caregiver uses the endoscope to view the inside of the stomach.  Taking a tissue sample (biopsy) from the stomach to examine under a microscope. TREATMENT  Depending on the cause of your gastritis, medicines may be prescribed. If you have a bacterial infection, such as an H pylori infection, antibiotics may be given. If your gastritis is caused by too much acid in the stomach, H2 blockers or antacids may be given. Your caregiver may recommend that you stop taking aspirin, ibuprofen, or other nonsteroidal anti-inflammatory drugs (NSAIDs). HOME CARE INSTRUCTIONS  Only take over-the-counter or prescription medicines as directed by your caregiver.  If you were given antibiotic medicines, take them as directed. Finish them even if you start to feel better.  Drink enough fluids to keep your urine clear or pale yellow.  Avoid foods and drinks that make your symptoms worse, such as:  Caffeine or alcoholic drinks.  Chocolate.  Peppermint or mint flavorings.  Garlic and onions.  Spicy foods.  Citrus fruits, such as oranges, lemons, or limes.  Tomato-based foods such as sauce, chili, salsa, and pizza.  Fried and fatty foods.  Eat small, frequent meals instead of large meals. SEEK  IMMEDIATE MEDICAL CARE IF:   You have black or dark red stools.  You vomit blood or material that looks like coffee grounds.  You are unable to keep fluids down.  Your abdominal pain gets worse.  You have a fever.  You do not feel better after 1 week.  You have any other questions or concerns. MAKE SURE YOU:  Understand these instructions.  Will watch your condition.  Will get help right away if you are not doing well or get worse. Document Released: 10/26/2001 Document Revised: 05/02/2012 Document Reviewed: 12/15/2011 [facility] Patient Information 2015 [place], [place]. This information is not intended to replace advice given to you by your health care provider. Make sure you discuss any questions you have with your health care provider.  Food Choices for Gastroesophageal Reflux Disease, gastritis When you have gastroesophageal reflux disease (GERD), the foods you eat and your eating habits are very important. Choosing the right foods can help ease the discomfort of GERD. WHAT GENERAL GUIDELINES DO I NEED TO FOLLOW?  Choose fruits, vegetables, whole grains, low-fat dairy products, and low-fat meat, fish, and poultry.  Limit fats such as oils, salad dressings, butter, nuts, and avocado.  Keep a food diary to identify foods that cause symptoms.  Avoid foods  that cause reflux. These may be different for different people.  Eat frequent small meals instead of three large meals each day.  Eat your meals slowly, in a relaxed setting.  Limit fried foods.  Cook foods using methods other than frying.  Avoid drinking alcohol.  Avoid drinking large amounts of liquids with your meals.  Avoid bending over or lying down until 2-3 hours after eating. WHAT FOODS ARE NOT RECOMMENDED? The following are some foods and drinks that may worsen your symptoms: Vegetables Tomatoes. Tomato juice. Tomato and spaghetti sauce. Chili peppers. Onion and garlic. Horseradish. Fruits Oranges,  grapefruit, and lemon (fruit and juice). Meats High-fat meats, fish, and poultry. This includes hot dogs, ribs, ham, sausage, salami, and bacon. Dairy Whole milk and chocolate milk. Sour cream. Cream. Butter. Ice cream. Cream cheese.  Beverages Coffee and tea, with or without caffeine. Carbonated beverages or energy drinks. Condiments Hot sauce. Barbecue sauce.  Sweets/Desserts Chocolate and cocoa. Donuts. Peppermint and spearmint. Fats and Oils High-fat foods, including [place] fries and potato chips. Other Vinegar. Strong spices, such as black pepper, white pepper, red pepper, cayenne, curry powder, cloves, ginger, and chili powder. The items listed above may not be a complete list of foods and beverages to avoid. Contact your dietitian for more information. Document Released: 11/01/2005 Document Revised: 11/06/2013 Document Reviewed: 09/05/2013 [facility] Patient Information 2015 [place], [place]. This information is not intended to replace advice given to you by your health care provider. Make sure you discuss any questions you have with your health care provider.

## 2015-08-04 NOTE — ED Notes (Signed)
Dr. Leonides Schanz in to see pt, wife at [facility]. Pt alert, NAD, calm, interactive, resps e/u, speaking in clear complete sentences, VSS, no dyspnea noted.

## 2015-08-04 NOTE — ED Provider Notes (Addendum)
TIME SEEN: 4:20 AM  CHIEF COMPLAINT: Abdominal pain  HPI: Pt is a 69 y.o. male with history of non-insulin-dependent diabetes, obesity, prostate cancer status post prostatectomy, gastric polyp which was resected in 2014 at [facility], hiatal hernia who presents emergency department with abdominal pain for the past 1-1/2 months. Describes the pain is in the epigastric region. Unable to describe the pain. Worse after eating. States tonight his tramadol did not help with his pain as it normally does. He has had nausea and one episode of vomiting last night. No diarrhea. No dysuria or hematuria. Denies bloody stool or melena. No fever but reports he did have chills tonight. Denies chest pain or shortness of breath. Reports his last endoscopy was in 2014 and colonoscopy in 2013. He is followed by Dr. Hilarie Fredrickson with gastroenterology. He had a CT scan on September 16 of his abdomen and pelvis. He has previously complained of right lower quadrant pain but is not complaining of this today.   PCP is [name]  ROS: See HPI Constitutional: no fever  Eyes: no drainage  ENT: no runny nose   Cardiovascular:  no chest pain  Resp: no SOB  GI:  vomiting GU: no dysuria Integumentary: no rash  Allergy: no hives  Musculoskeletal: no leg swelling  Neurological: no slurred speech ROS otherwise negative  PAST MEDICAL HISTORY/PAST SURGICAL HISTORY:  Past Medical History  Diagnosis Date  . Type II or unspecified type diabetes mellitus without mention of complication, not stated as uncontrolled   . Gouty arthropathy   . Other and unspecified hyperlipidemia   . Unspecified essential hypertension   . OSA (obstructive sleep apnea)     USES CPAP MACHINE   . Diverticulosis of colon (without mention of hemorrhage) 09/05/2006    Colonoscopy   . Hx of colonic polyps 09/05/2006    Colonoscopy(ADENOMATOUS POLYP)  . Hiatal hernia 09/05/2006    EGD  . 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 GERD (gastroesophageal reflux disease) 07/26/2001    EGD(Chronic)  . Hemorrhoids   . Coronary artery disease   . Prostate cancer 2015  . Iron deficiency anemia, unspecified   . Gastric polyp 2014    done at [place]:  Prior to Admission medications   Medication Sig Start Date End Date Taking? Authorizing Provider  allopurinol (ZYLOPRIM) 300 MG tablet TAKE 1 TABLET BY MOUTH DAILY TO PREVENT GOUT 02/25/15   [name], MD  amLODipine-benazepril (LOTREL) 10-20 MG per capsule Take 1 capsule by mouth every morning. 08/22/14   [name], MD  aspirin 81 MG tablet Take 81 mg by mouth daily.    Historical Provider, MD  atorvastatin (LIPITOR) 10 MG tablet TAKE 1 TABLET BY MOUTH EVERY DAY 06/02/15   Jettie Booze, MD  esomeprazole (NEXIUM) 40 MG capsule TAKE ONE CAPSULE BY MOUTH DAILY 01/05/15   [name], MD  FLUZONE HIGH-DOSE 0.5 ML SUSY inject 0.5 milliliter intramuscularly 07/12/15   Historical Provider, MD  linagliptin (TRADJENTA) 5 MG TABS tablet Take 1 tablet (5 mg total) by mouth daily. 02/19/15   [name], MD  nitroGLYCERIN (NITROSTAT) 0.4 MG SL tablet Place 0.4 mg under the tongue every 5 (five) minutes as needed for chest pain.    Historical Provider, MD  traMADol (ULTRAM) 50 MG tablet Take 1 tablet (50 mg total) by mouth every 6 (six) hours as needed. 07/15/15   Jerene Bears, MD  triamterene-hydrochlorothiazide (MAXZIDE-25) 37.5-25 MG per tablet Take 1 tablet by mouth daily. 07/23/15   Fay Records, MD    ALLERGIES:  Allergies  Allergen Reactions  . Dicloxacillin Rash  . Cayenne Pepper [Cayenne] Swelling    SOCIAL HISTORY:  Social History  Substance Use Topics  . Smoking status: Never Smoker   . Smokeless tobacco: Never Used  . Alcohol Use: No    FAMILY HISTORY: Family History  Problem Relation Age of Onset  . Heart attack Father   . Diabetes Mother   . Diabetes Brother     EXAM: BP 157/78 mmHg  Pulse 84  Temp(Src) 98.5 F (36.9 C)  (Oral)  Resp 20  Ht 5\' 9"  (1.753 m)  Wt 290 lb (131.543 kg)  BMI 42.81 kg/m2  SpO2 93% CONSTITUTIONAL: Alert and oriented and responds appropriately to questions. Well-appearing; well-nourished, obese, afebrile, in no distress HEAD: Normocephalic EYES: Conjunctivae clear, PERRL ENT: normal nose; no rhinorrhea; moist mucous membranes; pharynx without lesions noted NECK: Supple, no meningismus, no LAD  CARD: RRR; S1 and S2 appreciated; no murmurs, no clicks, no rubs, no gallops RESP: Normal chest excursion without splinting or tachypnea; breath sounds clear and equal bilaterally; no wheezes, no rhonchi, no rales, no hypoxia or respiratory distress, speaking full sentences ABD/GI: Normal bowel sounds; non-distended; soft, tender to palpation in the epigastric region without guarding or rebound, negative Murphy sign, no peritoneal signs, also tender over the right lower quadrant with no overlying erythema or warmth, induration noted to the soft tissues BACK:  The back appears normal and is non-tender to palpation, there is no CVA tenderness EXT: Normal ROM in all joints; non-tender to palpation; no edema; normal capillary refill; no cyanosis, no calf tenderness or swelling    SKIN: Normal color for age and race; warm NEURO: Moves all extremities equally, sensation to light touch intact diffusely, cranial nerves II through XII intact PSYCH: The patient's mood and manner are appropriate. Grooming and personal hygiene are appropriate.  MEDICAL DECISION MAKING: Patient here with abdominal pain has been present for the past 1-1/2 months. Reports his pain was not controlled with his tramadol as it normally is. One episode of vomiting. States this pain feels similar however to the pain that he has been experiencing recently. Had a CT scan performed 3 days ago. CT results below.  Pain in the right lower quadrant may be related to the small subcutaneous fluid collection. This is not a fluid collection that I  would be able to drain from the emergency department given patient has a large amount of fat and subcutaneous tissue in this area would not easily be accessible with a bedside I and D. Differential diagnosis includes gastritis, pancreatitis, less likely cholelithiasis or cholecystitis, appendicitis given recent normal CT scan that showed a normal gallbladder and appendix. I do not feel he needs repeat abdominal imaging at this time. We'll obtain labs and treat symptoms with morphine, Zofran, Protonix and GI cocktail.    CTAP with contrast 08/01/15:  Interval prostatectomy with low position of urinary bladder.  Small perirectal fluid collection 2 cm diameter versus a small amount of pelvic ascites.  17 x 10 x 18 mm area of density posterior to the urinary bladder, RIGHT anterolateral to the perirectal fluid collection, question postsurgical change versus tumor nodularity.  Multiple small low-attenuation hepatic lesions likely representing numerous cysts.  LEFT adrenal adenomas.  Nonspecific subcutaneous fluid collection RIGHT mid abdomen 4.5 x 1.5 x 3.6 cm, question postsurgical, related to prior  trauma or prior infection.  ED PROGRESS: 5:45 AM  Pt reports feeling better after morphine, Zofran, GI cocktail and Protonix. Labs show mild leukocytosis of 13 with left shift. LFTs, lipase normal. Troponin negative. Given right lower quadrant subcutaneous fluid collection and leukocytosis in the setting of right lower quadrant tenderness on exam, will discuss with general surgery for further recommendations.    6:10 AM  D/w Dr. [name] with general surgery who has reviewed patient's CT imaging. Greatly appreciate his help. It appears patient did not have this fluid collection on a previous CT in April 2015. He did have a robotic laparoscopic prostatectomy in June 2015. Dr. [name] feels that this is likely a postoperative residual seroma. He recommends US guided aspiration by  interventional radiology. I feel this can be done as an outpatient.  He has no sign of overlying cellulitis on exam. I do not feel he needs to be on antibiotics at this time. I have placed a referral to interventional radiology for urgent ultrasound guided aspiration of this fluid collection. Patient has follow-up with his primary care physician as well as a gastroenterologist. Have advised him to call his gastroenterologist for close follow-up. We'll discharge with prescription for Percocet for pain control given tramadol is not helping with his pain as well as Zofran. He has been able to drink without difficulty in the emergency department. I feel he is safe to go home without further emergent workup. I do not feel he needs a repeat CT scan at this time. Discussed supportive care instructions including diet changes. Discussed return precautions. He verbalized understanding and is comfortable with this plan.    Date: 08/04/2015 4:40 AM  Rate: 78  Rhythm: normal sinus rhythm  QRS Axis: normal  Intervals: normal  ST/T Wave abnormalities: Diffuse nonspecific ST flattening  Conduction Disutrbances: Occasional PACs  Narrative Interpretation: Patient has LVH, occasional PACs, diffuse ST flattening that is unchanged compared to prior EKGs, no significant change     [place], DO 08/04/15 [id number]   7:10 AM  Spoke with Dr. Vernard Gambles with IR.  He will contact patient to set up ultrasound-guided aspiration. He states they may be able to get the patient in today. Discussed this with patient and his wife who are comfortable with this plan.  Dr. Vernard Gambles will forward results to Dr. Renold Genta as well.  [place], DO 08/04/15 [phone]

## 2015-08-04 NOTE — ED Notes (Signed)
D/c instructions reviewed with pt and wife, verbalize understanding of need to call his pcp if they haven't heard from radiology with an apt for u/s this week.

## 2015-08-04 NOTE — ED Notes (Signed)
C/o abd pain and chills, also some intermittent nausea (none now), vomited x1 Sunday evening (denies: diarrhea, urinary sx, bleeding or known fever), describes pain as ongoing for ~ 1.5 months, mid upper abd, worse after eating tonight, has been seen by PCP for the same, CT done Friday (resulted in system), last ate 1500, "then pain increased", last BM Sunday morning (normal), PCP is Dr. Renold Genta, (no GI MD), no relief with tramadol.

## 2015-08-04 NOTE — ED Notes (Signed)
Pt is drinking a ginger ale. Will monitor pt response to ginger ale.

## 2015-08-04 NOTE — Telephone Encounter (Signed)
Phone call from Dr. Earleen Newport interventional radiologist regarding patient who presented to [facility] with abdominal pain. Apparently there was a fluid collection within fat that was thought perhaps could be drained. Dr. Earleen Newport does not think this can be drained and it's a small amount of fluid. Wants to make sure patient is followed up. Has a Richter-type hernia there but does not feel patient is obstructed. We'll defer to Dr. Hilarie Fredrickson

## 2015-08-04 NOTE — Telephone Encounter (Signed)
Spoke with pts wife and pt scheduled to see Alonza Bogus PA 08/12/15@1 :30pm. Pt was seen in ER for epigastric pain and nausea. Wife aware of appt.

## 2015-08-05 ENCOUNTER — Emergency Department (HOSPITAL_COMMUNITY): Payer: No Typology Code available for payment source

## 2015-08-05 ENCOUNTER — Encounter (HOSPITAL_COMMUNITY): Payer: Self-pay

## 2015-08-05 ENCOUNTER — Inpatient Hospital Stay (HOSPITAL_COMMUNITY): Payer: No Typology Code available for payment source

## 2015-08-05 ENCOUNTER — Inpatient Hospital Stay (HOSPITAL_COMMUNITY)
Admission: EM | Admit: 2015-08-05 | Discharge: 2015-08-15 | DRG: 354 | Disposition: A | Discharge status: Home / Self Care | Payer: No Typology Code available for payment source | Attending: Internal Medicine | Admitting: Internal Medicine

## 2015-08-05 DIAGNOSIS — D72829 Elevated white blood cell count, unspecified: Secondary | ICD-10-CM

## 2015-08-05 DIAGNOSIS — K9189 Other postprocedural complications and disorders of digestive system: Secondary | ICD-10-CM

## 2015-08-05 DIAGNOSIS — Z833 Family history of diabetes mellitus: Secondary | ICD-10-CM

## 2015-08-05 DIAGNOSIS — R197 Diarrhea, unspecified: Secondary | ICD-10-CM | POA: Diagnosis not present

## 2015-08-05 DIAGNOSIS — Z8546 Personal history of malignant neoplasm of prostate: Secondary | ICD-10-CM

## 2015-08-05 DIAGNOSIS — K432 Incisional hernia without obstruction or gangrene: Secondary | ICD-10-CM | POA: Diagnosis present

## 2015-08-05 DIAGNOSIS — Z8249 Family history of ischemic heart disease and other diseases of the circulatory system: Secondary | ICD-10-CM

## 2015-08-05 DIAGNOSIS — K913 Postprocedural intestinal obstruction: Secondary | ICD-10-CM | POA: Diagnosis not present

## 2015-08-05 DIAGNOSIS — Z7982 Long term (current) use of aspirin: Secondary | ICD-10-CM | POA: Diagnosis not present

## 2015-08-05 DIAGNOSIS — K5669 Other intestinal obstruction: Secondary | ICD-10-CM | POA: Diagnosis not present

## 2015-08-05 DIAGNOSIS — R600 Localized edema: Secondary | ICD-10-CM

## 2015-08-05 DIAGNOSIS — N179 Acute kidney failure, unspecified: Secondary | ICD-10-CM

## 2015-08-05 DIAGNOSIS — Z86010 Personal history of colonic polyps: Secondary | ICD-10-CM | POA: Diagnosis not present

## 2015-08-05 DIAGNOSIS — R188 Other ascites: Secondary | ICD-10-CM | POA: Diagnosis not present

## 2015-08-05 DIAGNOSIS — Z9079 Acquired absence of other genital organ(s): Secondary | ICD-10-CM | POA: Diagnosis present

## 2015-08-05 DIAGNOSIS — I10 Essential (primary) hypertension: Secondary | ICD-10-CM | POA: Diagnosis not present

## 2015-08-05 DIAGNOSIS — M109 Gout, unspecified: Secondary | ICD-10-CM | POA: Diagnosis present

## 2015-08-05 DIAGNOSIS — E860 Dehydration: Secondary | ICD-10-CM | POA: Diagnosis present

## 2015-08-05 DIAGNOSIS — Z6841 Body Mass Index (BMI) 40.0 and over, adult: Secondary | ICD-10-CM | POA: Diagnosis not present

## 2015-08-05 DIAGNOSIS — G4733 Obstructive sleep apnea (adult) (pediatric): Secondary | ICD-10-CM | POA: Diagnosis present

## 2015-08-05 DIAGNOSIS — E785 Hyperlipidemia, unspecified: Secondary | ICD-10-CM | POA: Diagnosis present

## 2015-08-05 DIAGNOSIS — Z0189 Encounter for other specified special examinations: Secondary | ICD-10-CM

## 2015-08-05 DIAGNOSIS — I2510 Atherosclerotic heart disease of native coronary artery without angina pectoris: Secondary | ICD-10-CM | POA: Diagnosis present

## 2015-08-05 DIAGNOSIS — R1110 Vomiting, unspecified: Secondary | ICD-10-CM

## 2015-08-05 DIAGNOSIS — Z4659 Encounter for fitting and adjustment of other gastrointestinal appliance and device: Secondary | ICD-10-CM

## 2015-08-05 DIAGNOSIS — Z79899 Other long term (current) drug therapy: Secondary | ICD-10-CM

## 2015-08-05 DIAGNOSIS — E119 Type 2 diabetes mellitus without complications: Secondary | ICD-10-CM | POA: Diagnosis not present

## 2015-08-05 DIAGNOSIS — K56609 Unspecified intestinal obstruction, unspecified as to partial versus complete obstruction: Secondary | ICD-10-CM | POA: Diagnosis present

## 2015-08-05 DIAGNOSIS — J9811 Atelectasis: Secondary | ICD-10-CM | POA: Diagnosis not present

## 2015-08-05 DIAGNOSIS — K219 Gastro-esophageal reflux disease without esophagitis: Secondary | ICD-10-CM | POA: Diagnosis not present

## 2015-08-05 DIAGNOSIS — K56600 Partial intestinal obstruction, unspecified as to cause: Secondary | ICD-10-CM

## 2015-08-05 DIAGNOSIS — K567 Ileus, unspecified: Secondary | ICD-10-CM

## 2015-08-05 DIAGNOSIS — D509 Iron deficiency anemia, unspecified: Secondary | ICD-10-CM | POA: Diagnosis present

## 2015-08-05 DIAGNOSIS — K429 Umbilical hernia without obstruction or gangrene: Secondary | ICD-10-CM | POA: Diagnosis present

## 2015-08-05 DIAGNOSIS — R112 Nausea with vomiting, unspecified: Secondary | ICD-10-CM | POA: Diagnosis present

## 2015-08-05 LAB — COMPREHENSIVE METABOLIC PANEL
ALBUMIN: 3.8 g/dL (ref 3.5–5.0)
ALK PHOS: 65 U/L (ref 38–126)
ALT: 18 U/L (ref 17–63)
ALT: 17 U/L (ref 17–63)
ANION GAP: 10 (ref 5–15)
AST: 21 U/L (ref 15–41)
AST: 20 U/L (ref 15–41)
Albumin: 3.9 g/dL (ref 3.5–5.0)
Alkaline Phosphatase: 67 U/L (ref 38–126)
Anion gap: 14 (ref 5–15)
BILIRUBIN TOTAL: 0.7 mg/dL (ref 0.3–1.2)
BUN: 16 mg/dL (ref 6–20)
BUN: 18 mg/dL (ref 6–20)
CALCIUM: 9.2 mg/dL (ref 8.9–10.3)
CHLORIDE: 100 mmol/L — AB (ref 101–111)
CO2: 22 mmol/L (ref 22–32)
CO2: 27 mmol/L (ref 22–32)
CREATININE: 1.03 mg/dL (ref 0.61–1.24)
Calcium: 9.3 mg/dL (ref 8.9–10.3)
Chloride: 99 mmol/L — ABNORMAL LOW (ref 101–111)
Creatinine, Ser: 1.16 mg/dL (ref 0.61–1.24)
GFR calc Af Amer: >60 mL/min (ref >60)
GFR calc non Af Amer: >60 mL/min (ref >60)
GFR calc non Af Amer: >60 mL/min (ref >60)
GLUCOSE: 157 mg/dL — AB (ref 65–99)
Glucose, Bld: 130 mg/dL — ABNORMAL HIGH (ref 65–99)
Potassium: 3.8 mmol/L (ref 3.5–5.1)
Potassium: 3.6 mmol/L (ref 3.5–5.1)
SODIUM: 137 mmol/L (ref 135–145)
Sodium: 135 mmol/L (ref 135–145)
TOTAL PROTEIN: 7.6 g/dL (ref 6.5–8.1)
Total Bilirubin: 0.8 mg/dL (ref 0.3–1.2)
Total Protein: 8 g/dL (ref 6.5–8.1)

## 2015-08-05 LAB — LIPASE, BLOOD: Lipase: 11 U/L — ABNORMAL LOW (ref 22–51)

## 2015-08-05 LAB — CBC WITH DIFFERENTIAL/PLATELET
BASOS ABS: 0 10*3/uL (ref 0.0–0.1)
Basophils Absolute: 0 10*3/uL (ref 0.0–0.1)
Basophils Relative: 0 %
Basophils Relative: 0 %
EOS ABS: 0 10*3/uL (ref 0.0–0.7)
EOS PCT: 0 %
Eosinophils Absolute: 0 10*3/uL (ref 0.0–0.7)
Eosinophils Relative: 0 %
HCT: 41.4 % (ref 39.0–52.0)
HEMATOCRIT: 39.9 % (ref 39.0–52.0)
HEMOGLOBIN: 14 g/dL (ref 13.0–17.0)
Hemoglobin: 13.8 g/dL (ref 13.0–17.0)
LYMPHS ABS: 1.9 10*3/uL (ref 0.7–4.0)
LYMPHS PCT: 27 %
Lymphocytes Relative: 18 %
Lymphs Abs: 2.4 10*3/uL (ref 0.7–4.0)
MCH: 30.7 pg (ref 26.0–34.0)
MCH: 31.4 pg (ref 26.0–34.0)
MCHC: 33.8 g/dL (ref 30.0–36.0)
MCHC: 34.6 g/dL (ref 30.0–36.0)
MCV: 90.8 fL (ref 78.0–100.0)
MCV: 90.7 fL (ref 78.0–100.0)
MONOS PCT: 11 %
Monocytes Absolute: 1.2 10*3/uL — ABNORMAL HIGH (ref 0.1–1.0)
Monocytes Absolute: 1.6 10*3/uL — ABNORMAL HIGH (ref 0.1–1.0)
Monocytes Relative: 18 %
NEUTROS ABS: 5 10*3/uL (ref 1.7–7.7)
NEUTROS PCT: 71 %
Neutro Abs: 7.4 10*3/uL (ref 1.7–7.7)
Neutrophils Relative %: 55 %
PLATELETS: 309 10*3/uL (ref 150–400)
Platelets: 323 10*3/uL (ref 150–400)
RBC: 4.56 MIL/uL (ref 4.22–5.81)
RBC: 4.4 MIL/uL (ref 4.22–5.81)
RDW: 14.5 % (ref 11.5–15.5)
RDW: 14.6 % (ref 11.5–15.5)
WBC: 10.5 10*3/uL (ref 4.0–10.5)
WBC: 9 10*3/uL (ref 4.0–10.5)

## 2015-08-05 LAB — URINALYSIS, ROUTINE W REFLEX MICROSCOPIC
Glucose, UA: NEGATIVE mg/dL
Ketones, ur: NEGATIVE mg/dL
Leukocytes, UA: NEGATIVE
Nitrite: NEGATIVE
PH: 5.5 (ref 5.0–8.0)
Protein, ur: 100 mg/dL — AB
SPECIFIC GRAVITY, URINE: 1.029 (ref 1.005–1.030)
Urobilinogen, UA: 0.2 mg/dL (ref 0.0–1.0)

## 2015-08-05 LAB — APTT: aPTT: 30 seconds (ref 24–37)

## 2015-08-05 LAB — URINE MICROSCOPIC-ADD ON

## 2015-08-05 LAB — PHOSPHORUS: PHOSPHORUS: 2.8 mg/dL (ref 2.5–4.6)

## 2015-08-05 LAB — MAGNESIUM: Magnesium: 2.2 mg/dL (ref 1.7–2.4)

## 2015-08-05 LAB — PROTIME-INR
INR: 1.18 (ref 0.00–1.49)
Prothrombin Time: 15.1 seconds (ref 11.6–15.2)

## 2015-08-05 LAB — GLUCOSE, CAPILLARY: GLUCOSE-CAPILLARY: 126 mg/dL — AB (ref 65–99)

## 2015-08-05 MED ORDER — SODIUM CHLORIDE 0.9 % IV BOLUS (SEPSIS)
1000.0000 mL | Freq: Once | INTRAVENOUS | Status: AC
  Administered 2015-08-05: 1000 mL via INTRAVENOUS

## 2015-08-05 MED ORDER — ONDANSETRON HCL 4 MG/2ML IJ SOLN
4.0000 mg | Freq: Four times a day (QID) | INTRAMUSCULAR | Status: DC | PRN
  Administered 2015-08-05 – 2015-08-10 (×5): 4 mg via INTRAVENOUS
  Filled 2015-08-05 (×5): qty 2

## 2015-08-05 MED ORDER — HYDRALAZINE HCL 20 MG/ML IJ SOLN
5.0000 mg | Freq: Four times a day (QID) | INTRAMUSCULAR | Status: DC
  Administered 2015-08-05 – 2015-08-15 (×38): 5 mg via INTRAVENOUS
  Filled 2015-08-05 (×38): qty 1

## 2015-08-05 MED ORDER — GI COCKTAIL ~~LOC~~
30.0000 mL | Freq: Once | ORAL | Status: AC
  Administered 2015-08-05: 30 mL via ORAL
  Filled 2015-08-05: qty 30

## 2015-08-05 MED ORDER — ENOXAPARIN SODIUM 60 MG/0.6ML ~~LOC~~ SOLN
60.0000 mg | SUBCUTANEOUS | Status: DC
  Administered 2015-08-05 – 2015-08-14 (×9): 60 mg via SUBCUTANEOUS
  Filled 2015-08-05 (×9): qty 0.6

## 2015-08-05 MED ORDER — SODIUM CHLORIDE 0.9 % IV SOLN
INTRAVENOUS | Status: DC
  Administered 2015-08-05: 16:00:00 via INTRAVENOUS

## 2015-08-05 MED ORDER — ACETAMINOPHEN 325 MG PO TABS: 650.0000 mg | ORAL_TABLET | Freq: Four times a day (QID) | ORAL | Status: DC | PRN

## 2015-08-05 MED ORDER — PANTOPRAZOLE SODIUM 40 MG IV SOLR
40.0000 mg | Freq: Two times a day (BID) | INTRAVENOUS | Status: DC
  Administered 2015-08-05 – 2015-08-14 (×19): 40 mg via INTRAVENOUS
  Filled 2015-08-05 (×20): qty 40

## 2015-08-05 MED ORDER — PANTOPRAZOLE SODIUM 40 MG IV SOLR: 40.0000 mg | INTRAVENOUS | Status: DC

## 2015-08-05 MED ORDER — ONDANSETRON HCL 4 MG PO TABS: 4.0000 mg | ORAL_TABLET | Freq: Four times a day (QID) | ORAL | Status: DC | PRN

## 2015-08-05 MED ORDER — LORAZEPAM 2 MG/ML IJ SOLN
1.0000 mg | Freq: Once | INTRAMUSCULAR | Status: AC
  Administered 2015-08-05: 1 mg via INTRAVENOUS
  Filled 2015-08-05: qty 1

## 2015-08-05 MED ORDER — HYDROMORPHONE HCL 1 MG/ML IJ SOLN: 1.0000 mg | INTRAMUSCULAR | Status: DC | PRN

## 2015-08-05 MED ORDER — ONDANSETRON HCL 4 MG/2ML IJ SOLN
4.0000 mg | Freq: Once | INTRAMUSCULAR | Status: AC
  Administered 2015-08-05: 4 mg via INTRAVENOUS
  Filled 2015-08-05: qty 2

## 2015-08-05 MED ORDER — INSULIN ASPART 100 UNIT/ML ~~LOC~~ SOLN
0.0000 [IU] | Freq: Three times a day (TID) | SUBCUTANEOUS | Status: DC
  Administered 2015-08-05: 1 [IU] via SUBCUTANEOUS

## 2015-08-05 MED ORDER — ACETAMINOPHEN 650 MG RE SUPP: 650.0000 mg | Freq: Four times a day (QID) | RECTAL | Status: DC | PRN

## 2015-08-05 NOTE — ED Notes (Signed)
Pt reports have an appointment w/ GI on 9/27.

## 2015-08-05 NOTE — Progress Notes (Signed)
NG tube  Insertion per order was unsuccessful. Was attempted by 3 RN'S on floor. Md on call notified. Order for IR  To insert was placed. IR is closed so it will be done in the morning. Will continue to monitor and assist with nausea control as needed.

## 2015-08-05 NOTE — ED Notes (Addendum)
Pt c/o intermittent abdominal pain and n/v since July increasing x 3 days.  Currently, denies pain and diarrhea.  Last BM x 2 days ago.  Pt was seen at [facility] yesterday for same.   Pt reports that prescribed medications have helped "a little."  Pt reports having a CT scan x 4 days ago.

## 2015-08-05 NOTE — ED Provider Notes (Signed)
CSN: [id number]     Arrival date & time 08/05/15  8003 History   First MD Initiated Contact with Patient 08/05/15 [phone]     Chief Complaint  Patient presents with  . Emesis  . Weakness     (Consider location/radiation/quality/duration/timing/severity/associated sxs/prior Treatment) Patient is a 69 y.o. male presenting with abdominal pain.  Abdominal Pain Pain location:  Epigastric Pain quality: burning   Pain radiates to:  Does not radiate Pain severity:  Severe Onset quality:  Gradual Duration: several months, but worse over last week. Timing:  Constant Progression:  Partially resolved Chronicity:  New Context comment:  CT performed by PCP showed an intraabdominal fluid collection.  then seen at [facility] ED with ultimate plan for outpatient IR guided drainage of colleciton. Relieved by: pain meds, zofran. Exacerbated by: pain is not worse with food.  Associated symptoms: belching, chills, nausea and vomiting   Associated symptoms: no diarrhea and no fever     Past Medical History  Diagnosis Date  . Type II or unspecified type diabetes mellitus without mention of complication, not stated as uncontrolled   . Gouty arthropathy   . Other and unspecified hyperlipidemia   . Unspecified essential hypertension   . OSA (obstructive sleep apnea)     USES CPAP MACHINE   . Diverticulosis of colon (without mention of hemorrhage) 09/05/2006    Colonoscopy   . Hx of colonic polyps 09/05/2006    Colonoscopy(ADENOMATOUS POLYP)  . Hiatal hernia 09/05/2006    EGD  . 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 GERD (gastroesophageal reflux disease) 07/26/2001    EGD(Chronic)  . Hemorrhoids   . Coronary artery disease   . Prostate cancer 2015  . Iron deficiency anemia, unspecified   . Gastric polyp 2014    done at [facility]   Past Surgical History  Procedure Laterality Date  . 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 Hemorrhoid  surgery    . Robot assisted laparoscopic radical prostatectomy N/A 05/01/2014    Procedure: ROBOTIC ASSISTED LAPAROSCOPIC RADICAL PROSTATECTOMY WITH INDOCYANINE GREEN DYE;  Surgeon: Alexis Frock, MD;  Location: [facility] ORS;  Service: Urology;  Laterality: N/A;  . Lymphadenectomy Bilateral 05/01/2014    Procedure: LYMPHADENECTOMY;  Surgeon: Alexis Frock, MD;  Location: [facility] ORS;  Service: Urology;  Laterality: Bilateral;  . Esophagogastroduodenoscopy      Done at [facility]  . Gi prostate biopsy     Family History  Problem Relation Age of Onset  . Heart attack Father   . Diabetes Mother   . Diabetes Brother    Social History  Substance Use Topics  . Smoking status: Never Smoker   . Smokeless tobacco: Never Used  . Alcohol Use: No    Review of Systems  Constitutional: Positive for chills. Negative for fever.  Gastrointestinal: Positive for nausea, vomiting and abdominal pain. Negative for diarrhea.  Neurological: Positive for weakness.  All other systems reviewed and are negative.     Allergies  Dicloxacillin and Cayenne pepper  Home Medications   Prior to Admission medications   Medication Sig Start Date End Date Taking? Authorizing Provider  allopurinol (ZYLOPRIM) 300 MG tablet TAKE 1 TABLET BY MOUTH DAILY TO PREVENT GOUT 02/25/15  Yes [name], MD  amLODipine-benazepril (LOTREL) 10-20 MG per capsule Take 1 capsule by mouth every morning. 08/22/14  Yes [name], MD  aspirin 81 MG tablet Take 81 mg by mouth  daily.   Yes Historical Provider, MD  atorvastatin (LIPITOR) 10 MG tablet TAKE 1 TABLET BY MOUTH EVERY DAY 06/02/15  Yes Jettie Booze, MD  esomeprazole (NEXIUM) 40 MG capsule TAKE ONE CAPSULE BY MOUTH DAILY 01/05/15  Yes [name], MD  FLUZONE HIGH-DOSE 0.5 ML SUSY inject 0.5 milliliter intramuscularly 07/12/15  Yes Historical Provider, MD  linagliptin (TRADJENTA) 5 MG TABS tablet Take 1 tablet (5 mg total) by mouth daily. 02/19/15  Yes [name], MD   nitroGLYCERIN (NITROSTAT) 0.4 MG SL tablet Place 0.4 mg under the tongue every 5 (five) minutes as needed for chest pain.   Yes Historical Provider, MD  ondansetron (ZOFRAN ODT) 4 MG disintegrating tablet Take 1 tablet (4 mg total) by mouth every 8 (eight) hours as needed for nausea or vomiting. 08/04/15  Yes Kristen N Ward, DO  oxyCODONE-acetaminophen (PERCOCET/ROXICET) 5-325 MG per tablet Take 1-2 tablets by mouth every 6 (six) hours as needed. Patient taking differently: Take 1-2 tablets by mouth every 6 (six) hours as needed for moderate pain.  08/04/15  Yes Kristen N Ward, DO  traMADol (ULTRAM) 50 MG tablet Take 1 tablet (50 mg total) by mouth every 6 (six) hours as needed. Patient taking differently: Take 50 mg by mouth every 6 (six) hours as needed for moderate pain.  07/15/15  Yes Jerene Bears, MD  triamterene-hydrochlorothiazide (MAXZIDE-25) 37.5-25 MG per tablet Take 1 tablet by mouth daily. 07/23/15  Yes Fay Records, MD   BP 163/86 mmHg  Pulse 100  Temp(Src) 98 F (36.7 C) (Oral)  Resp 20  SpO2 92% Physical Exam  Constitutional: He is oriented to person, place, and time. He appears well-developed and well-nourished. No distress.  HENT:  Head: Normocephalic and atraumatic.  Mouth/Throat: Oropharynx is clear and moist.  Eyes: Conjunctivae are normal. Pupils are equal, round, and reactive to light. No scleral icterus.  Neck: Neck supple.  Cardiovascular: Normal rate, regular rhythm, normal heart sounds and intact distal pulses.   No murmur heard. Pulmonary/Chest: Effort normal and breath sounds normal. No stridor. No respiratory distress. He has no wheezes. He has no rales.  Abdominal: Soft. He exhibits no distension. There is tenderness (mild) in the right upper quadrant, epigastric area and left upper quadrant. There is no rigidity, no rebound and no guarding.  Musculoskeletal: Normal range of motion. He exhibits no edema.  Neurological: He is alert and oriented to person, place,  and time.  Skin: Skin is warm and dry. No rash noted.  Psychiatric: He has a normal mood and affect. His behavior is normal.  Nursing note and vitals reviewed.   ED Course  Procedures (including critical care time) Labs Review Labs Reviewed  COMPREHENSIVE METABOLIC PANEL - Abnormal; Notable for the following:    Chloride 99 (*)    Glucose, Bld 157 (*)    All other components within normal limits  LIPASE, BLOOD - Abnormal; Notable for the following:    Lipase 11 (*)    All other components within normal limits  URINALYSIS, ROUTINE W REFLEX MICROSCOPIC (NOT AT [facility]) - Abnormal; Notable for the following:    Color, Urine AMBER (*)    Hgb urine dipstick TRACE (*)    Bilirubin Urine SMALL (*)    Protein, ur 100 (*)    All other components within normal limits  CBC WITH DIFFERENTIAL/PLATELET - Abnormal; Notable for the following:    Monocytes Absolute 1.2 (*)    All other components within normal limits  URINE MICROSCOPIC-ADD ON - Abnormal; Notable for the following:    Casts HYALINE CASTS (*)    All other components within normal limits  CBC WITH DIFFERENTIAL/PLATELET  CBC  COMPREHENSIVE METABOLIC PANEL  MAGNESIUM  PHOSPHORUS  CBC WITH DIFFERENTIAL/PLATELET  APTT  PROTIME-INR    Imaging Review Dg Abd Acute W/chest  08/05/2015   CLINICAL DATA:  Nausea and vomiting for 2-3 days.  EXAM: DG ABDOMEN ACUTE W/ 1V CHEST  COMPARISON:  April 24, 2014  FINDINGS: There is mild atelectasis of the right lung base. The heart size is mildly enlarged. The aorta is tortuous. There is no pleural effusion. There are multiple dilated small bowel loops in the abdomen. Residual contrast is identified in the colon. There is scoliosis of spine.  IMPRESSION: Multiple dilated small bowel loops. The findings are consistent with early small bowel obstruction or partial small bowel obstruction.  Mild atelectasis of right lung base.   Electronically Signed   By: [name] M.D.   On: 08/05/2015 12:25   I  have personally reviewed and evaluated these images and lab results as part of my medical decision-making.   EKG Interpretation None      MDM   Final diagnoses:  Partial small bowel obstruction    Pt has persistent nausea and upper abdominal pain.  Shortly after my initial eval, he vomited a large amount.  AAS showed concern for partial SBO.  Admitted to internal medicine.  Surgery consulted.      Serita Grit, MD 08/05/15 [phone]

## 2015-08-05 NOTE — ED Notes (Signed)
IV insertion attempt x 2 without success.  Will ask another RN to attempt.

## 2015-08-05 NOTE — ED Notes (Signed)
Page sent to dr. Charlies Silvers that the pt needs nausea meds

## 2015-08-05 NOTE — Consult Note (Signed)
Reason for Consult: Partial SBO Referring Physician: Dr. Neil Crouch  Leonard [name] is an 69 y.o. male.  HPI: Pt with a 6 week history of abdominal pain.  He was seen by GI, Dr. Hilarie Fredrickson, on 07/15/15.  He noted intermittent pain sharp upper epigastric area that comes and goes. He said it would come on anytime and last about 20-30 minutes.   He reported chills with the pain, not related to PO intake. Bowel movements were reported to be normal.  He has a history of hyperplastic gastric polyp, resected 2 years ago at [facility].  He was seen again in Ed on 08/04/15 with abdominal pain again for at least 6 weeks. He had nausea and vomiting with the episode, no diarrhea.  CT from 08/01/15, Small subcutaneous collection identified external to the fascia inRIGHT mid abdomen the level of the iliac crests, 4.5 x 1.5 x 3.6 cm in size.  He returned to the ED at [place], on 9/19 with pain, vomited on _0 15    Past Surgical History  Procedure Laterality Date  . 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 Hemorrhoid surgery    . Robot assisted laparoscopic radical prostatectomy N/A 05/01/2014    Procedure: ROBOTIC ASSISTED LAPAROSCOPIC RADICAL PROSTATECTOMY WITH INDOCYANINE GREEN DYE;  Surgeon: Alexis Frock, MD;  Location: [facility];  Service: Urology;  Laterality: N/A;  .  Lymphadenectomy Bilateral 05/01/2014    Procedure: LYMPHADENECTOMY;  Surgeon: Alexis Frock, MD;  Location: [facility];  Service: Urology;  Laterality: Bilateral;  . Esophagogastroduodenoscopy      Done at [facility]  . Gi prostate biopsy      Family History  Problem Relation Age of Onset  . Heart attack Father   . Diabetes Mother   . Diabetes Brother     Social History:  reports that he has never smoked. He has never used smokeless tobacco. He reports that he does not drink alcohol or use illicit drugs.  Allergies:  Allergies  Allergen Reactions  . Dicloxacillin Rash  . Cayenne Pepper [Cayenne] Swelling   Prior to Admission medications   Medication Sig Start  Date End Date Taking? Authorizing Provider  allopurinol (ZYLOPRIM) 300 MG tablet TAKE 1 TABLET BY MOUTH DAILY TO PREVENT GOUT 02/25/15  Yes [name], MD  amLODipine-benazepril (LOTREL) 10-20 MG per capsule Take 1 capsule by mouth every morning. 08/22/14  Yes [name], MD  aspirin 81 MG tablet Take 81 mg by mouth daily.   Yes Historical Provider, MD  atorvastatin (LIPITOR) 10 MG tablet TAKE 1 TABLET BY MOUTH EVERY DAY 06/02/15  Yes Jettie Booze, MD  esomeprazole (NEXIUM) 40 MG capsule TAKE ONE CAPSULE BY MOUTH DAILY 01/05/15  Yes [name], MD  FLUZONE HIGH-DOSE 0.5 ML SUSY inject 0.5 milliliter intramuscularly 07/12/15  Yes Historical Provider, MD  linagliptin (TRADJENTA) 5 MG TABS tablet Take 1 tablet (5 mg total) by mouth daily. 02/19/15  Yes [name], MD  nitroGLYCERIN (NITROSTAT) 0.4 MG SL tablet Place 0.4 mg under the tongue every 5 (five) minutes as needed for chest pain.   Yes Historical Provider, MD  ondansetron (ZOFRAN ODT) 4 MG disintegrating tablet Take 1 tablet (4 mg total) by mouth every 8 (eight) hours as needed for nausea or vomiting. 08/04/15  Yes Kristen N Ward, DO  oxyCODONE-acetaminophen (PERCOCET/ROXICET) 5-325 MG per tablet Take 1-2 tablets by mouth every 6 (six) hours as needed. Patient taking differently: Take 1-2 tablets by mouth every 6 (six) hours as needed for moderate pain.  08/04/15  Yes Kristen N Ward, DO  traMADol (ULTRAM) 50 MG tablet Take 1 tablet (50 mg total) by mouth every 6 (six) hours as needed. Patient taking differently: Take 50 mg by mouth every 6 (six) hours as needed for moderate pain.  07/15/15  Yes Jerene Bears, MD  triamterene-hydrochlorothiazide (MAXZIDE-25) 37.5-25 MG per tablet Take 1 tablet by mouth daily. 07/23/15  Yes Fay Records, MD     Results for orders placed or performed during the hospital encounter of 08/05/15 (from the past 48 hour(s))  Comprehensive metabolic panel     Status: Abnormal   Collection Time: 08/05/15  9:15 AM   Result Value Ref Range   Sodium 135 135 - 145 mmol/L   Potassium 3.8 3.5 - 5.1 mmol/L   Chloride 99 (L) 101 - 111 mmol/L   CO2 22 22 - 32 mmol/L   Glucose, Bld 157 (H) 65 - 99 mg/dL   BUN 16 6 - 20 mg/dL   Creatinine, Ser 1.03 0.61 - 1.24 mg/dL   Calcium 9.2 8.9 - 10.3 mg/dL   Total Protein 7.6 6.5 - 8.1 g/dL   Albumin 3.8 3.5 - 5.0 g/dL   AST 21 15 - 41 U/L   ALT 18 17 - 63 U/L   Alkaline Phosphatase 65 38 - 126 U/L   Total Bilirubin 0.7  0.3 - 1.2 mg/dL   GFR calc non Af Amer >60 >60 mL/min   GFR calc Af Amer >60 >60 mL/min    Comment: (NOTE) The eGFR has been calculated using the CKD EPI equation. This calculation has not been validated in all clinical situations. eGFR's persistently <60 mL/min signify possible Chronic Kidney Disease.    Anion gap 14 5 - 15  Lipase, blood     Status: Abnormal   Collection Time: 08/05/15  9:15 AM  Result Value Ref Range   Lipase 11 (L) 22 - 51 U/L  CBC with Differential     Status: Abnormal   Collection Time: 08/05/15 10:40 AM  Result Value Ref Range   WBC 10.5 4.0 - 10.5 K/uL   RBC 4.56 4.22 - 5.81 MIL/uL   Hemoglobin 14.0 13.0 - 17.0 g/dL   HCT 41.4 39.0 - 52.0 %   MCV 90.8 78.0 - 100.0 fL   MCH 30.7 26.0 - 34.0 pg   MCHC 33.8 30.0 - 36.0 g/dL   RDW 14.5 11.5 - 15.5 %   Platelets 323 150 - 400 K/uL   Neutrophils Relative % 71 %   Neutro Abs 7.4 1.7 - 7.7 K/uL   Lymphocytes Relative 18 %   Lymphs Abs 1.9 0.7 - 4.0 K/uL   Monocytes Relative 11 %   Monocytes Absolute 1.2 (H) 0.1 - 1.0 K/uL   Eosinophils Relative 0 %   Eosinophils Absolute 0.0 0.0 - 0.7 K/uL   Basophils Relative 0 %   Basophils Absolute 0.0 0.0 - 0.1 K/uL  Urinalysis, Routine w reflex microscopic (not at [facility])     Status: Abnormal   Collection Time: 08/05/15 11:26 AM  Result Value Ref Range   Color, Urine AMBER (A) YELLOW    Comment: BIOCHEMICALS MAY BE AFFECTED BY COLOR   APPearance CLEAR CLEAR   Specific Gravity, Urine 1.029 1.005 - 1.030   pH 5.5 5.0 - 8.0    Glucose, UA NEGATIVE NEGATIVE mg/dL   Hgb urine dipstick TRACE (A) NEGATIVE   Bilirubin Urine SMALL (A) NEGATIVE   Ketones, ur NEGATIVE NEGATIVE mg/dL   Protein, ur 100 (A) NEGATIVE mg/dL   Urobilinogen, UA 0.2 0.0 - 1.0 mg/dL   Nitrite NEGATIVE NEGATIVE   Leukocytes, UA NEGATIVE NEGATIVE  Urine microscopic-add on     Status: Abnormal   Collection Time: 08/05/15 11:26 AM  Result Value Ref Range   Squamous Epithelial / LPF RARE RARE   WBC, UA 0-2 <3 WBC/hpf   RBC / HPF 0-2 <3 RBC/hpf   Bacteria, UA RARE RARE   Casts HYALINE CASTS (A) NEGATIVE    Dg Abd Acute W/chest  08/05/2015   CLINICAL DATA:  Nausea and vomiting for 2-3 days.  EXAM: DG ABDOMEN ACUTE W/ 1V CHEST  COMPARISON:  April 24, 2014  FINDINGS: There is mild atelectasis of the right lung base. The heart size is mildly enlarged. The aorta is tortuous. There is no pleural effusion. There are multiple dilated small bowel loops in the abdomen. Residual contrast is identified in the colon. There is scoliosis of spine.  IMPRESSION: Multiple dilated small bowel loops. The findings are consistent with early small bowel obstruction or partial small bowel obstruction.  Mild atelectasis of right lung base.   Electronically Signed   By: [name] M.D.   On: 08/05/2015 12:25    Review of Systems  Constitutional: Positive for chills, weight loss (6-7 pounds over the last week ) and malaise/fatigue. Negative for fever  and diaphoresis.  HENT: Negative.   Eyes: Negative.   Respiratory: Negative.   Cardiovascular: Negative.   Gastrointestinal: Positive for heartburn (OK on PPI), nausea, vomiting and abdominal pain. Negative for diarrhea, constipation, blood in stool and melena.  Genitourinary: Negative.   Musculoskeletal: Negative.   Skin: Negative.   Neurological: Negative.  Negative for weakness.  Endo/Heme/Allergies: Negative.   Psychiatric/Behavioral: Negative.    Blood pressure 163/87, pulse 87, temperature 98.6 F (37 C),  temperature source Oral, resp. rate 20, SpO2 92 %. Physical Exam  Constitutional: He is oriented to person, place, and time. He appears well-developed and well-nourished. No distress.  Body mass index is 41.59 kg/(m^2).   HENT:  Head: Normocephalic and atraumatic.  Nose: Nose normal.  Eyes: Conjunctivae and EOM are normal. Right eye exhibits no discharge. Left eye exhibits no discharge. No scleral icterus.  Neck: Normal range of motion. Neck supple. No JVD present. No tracheal deviation present. No thyromegaly present.  Cardiovascular: Normal rate, regular rhythm and intact distal pulses.   No murmur heard. Respiratory: Effort normal and breath sounds normal. No respiratory distress. He has no wheezes. He has no rales. He exhibits no tenderness.  GI: Soft. He exhibits no distension and no mass. There is no tenderness. There is no rebound and no guarding.  Musculoskeletal: He exhibits no edema.  Lymphadenopathy:    He has no cervical adenopathy.  Neurological: He is alert and oriented to person, place, and time. No cranial nerve deficit.  Skin: Skin is warm and dry. No rash noted. He is not diaphoretic. No erythema. No pallor.  Psychiatric: He has a normal mood and affect. His behavior is normal. Judgment and thought content normal.    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 Which is upper epigastric pain.  He has new nausea and vomiting, no BM since 9/18 with a film consistent with SBO, with a  reported Richter hernia by Dr. Jacqualyn Posey.  I would keep him NPO, hydrate, and if he gets nauseated again insert an NG.   I would let Dr. Hilarie Fredrickson know he is here also.     JENNINGS,WILLARD 08/05/2015, 2:23 PM

## 2015-08-05 NOTE — ED Notes (Signed)
Went to stick pt to draw blood with no luck. Pt stated he was feeling nauseous and once I pulled the needle out, pt started projectile vomiting all over himself. Pt vomited 7 times and was soaked. Nurse was called right away and we washed pt and the bed and pt is now saying he feels better.

## 2015-08-05 NOTE — H&P (Signed)
[facility] History and Physical  Leonard [name] VXY:[id number] DOB: [birth year] DOA: 08/05/2015  Referring physician: ER physician: Dr. Serita [name] PCP: Leonard Showers, MD  Chief Complaint: nausea and vomiting   HPI:  69 year old male with past medical history of prostate cancer status post prostatectomy, hypertension, gout, DM, dyslipidemia who presented to [facility] ED with main concern for ongoing abdominal pain, nausea and vomiting for past few weeks prior to this admission. Pain is mostly in epigastric area, discomfort feels like cramps and occasionally as sharp pain about 7/10 in intensity. After vomiting pain improves. No reports of blood in emesis. No diarrhea or constipation. No blood in stool. Pt has been seen in ED 9/19 at which time he had CT abdomen which demonstrated areas of fluid collection in perirectal area, posterior to urinary bladder which could represent tumor nodularity or infection. Dr. Zella [name] of surgery has recommended IR evaluation and pt was going to be set up for aspiration per Dr. Milta [name] of IR. Pt was subsequently discharged home. On this admission, he is hemodynamically stable. Abd x ray on admission showed SBO. He was admitted for management of SBO. His blood work was essentially unremarkable.    Assessment & Plan    Principal Problem:   SBO (small bowel obstruction) / Nausea and vomiting - Possibly from adhesion - Abdominal x ray demonstrated multiple dilated small bowel loops consistent with early SBO - CT abdomen abdomen was done on recent visit to ED and showed areas of fluid collection in perirectal area, posterior to urinary bladder which could represent tumor nodularity or infection. We will follow up on surgery recommendations  - He is still complaining of nausea and vomiting and has had at least 2-3 episodes of vomiting since coming from ER. We will place NG tube  - Follow up in am on surgery recommendations.    Active Problems:   Abdominal wall  fluid collections - Plan since recent visit to ED to have IR do possible aspiration - Consulted IR for input if this fluid collection can be drained      Dyslipidemia - Resume statin therapy once tolerates PO intake    Obstructive sleep apnea - CPAP at bedtime, he brought machine from home    Essential hypertension - Hold Maxzide until able to tolerate PO - For now may use hydralazine for BP control (IV route)    CAD (coronary artery disease) - Stable - No reports of chest pain    History of prostate cancer - History of prostatectomy    Diabetes mellitus type 2, controlled, without complications - Recent M2L controlled, less than 7 - Tradjenta on hold and started SSI since pt is NPO    Gout - Resume allopurinol once tolerates PO intake     GERD (gastroesophageal reflux disease) - Took protonix today at home - Resume protonix form tomorrow, give IV route until tolerates PO intake    DVT prophylaxis:  - Lovenox subQ ordered    Radiological Exams on Admission: Dg Abd Acute W/chest 08/05/2015  Multiple dilated small bowel loops. The findings are consistent with early small bowel obstruction or partial small bowel obstruction.  Mild atelectasis of right lung base.   Electronically Signed   By: Leonard [name] M.D.   On: 08/05/2015 12:25    Code Status: Full Family Communication: Plan of care discussed with the patient  Disposition Plan: Admit for further evaluation  Leonard Lenz, MD  [facility] Pager [phone]  Time spent in minutes: 75  minutes  Review of Systems:  Constitutional: Negative for fever, chills and malaise/fatigue. Negative for diaphoresis.  HENT: Negative for hearing loss, ear pain, nosebleeds, congestion, sore throat, neck pain, tinnitus and ear discharge.   Eyes: Negative for blurred vision, double vision, photophobia, pain, discharge and redness.  Respiratory: Negative for cough, hemoptysis, sputum production, shortness of breath, wheezing and  stridor.   Cardiovascular: Negative for chest pain, palpitations, orthopnea, claudication and leg swelling.  Gastrointestinal: per HPI Genitourinary: Negative for dysuria, urgency, frequency, hematuria and flank pain.  Musculoskeletal: Negative for myalgias, back pain, joint pain and falls.  Skin: Negative for itching and rash.  Neurological: Negative for dizziness and weakness. Negative for tingling, tremors, sensory change, speech change, focal weakness, loss of consciousness and headaches.  Endo/Heme/Allergies: Negative for environmental allergies and polydipsia. Does not bruise/bleed easily.  Psychiatric/Behavioral: Negative for suicidal ideas. The patient is not nervous/anxious.      Past Medical History  Diagnosis Date  . Type II or unspecified type diabetes mellitus without mention of complication, not stated as uncontrolled   . Gouty arthropathy   . Other and unspecified hyperlipidemia   . Unspecified essential hypertension   . OSA (obstructive sleep apnea)     USES CPAP MACHINE   . Diverticulosis of colon (without mention of hemorrhage) 09/05/2006    Colonoscopy   . Hx of colonic polyps 09/05/2006    Colonoscopy(ADENOMATOUS POLYP)  . Hiatal hernia 09/05/2006    EGD  . 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 GERD (gastroesophageal reflux disease) 07/26/2001    EGD(Chronic)  . Hemorrhoids   . Coronary artery disease   . Prostate cancer 2015  . Iron deficiency anemia, unspecified   . Gastric polyp 2014    done at [facility]   Past Surgical History  Procedure Laterality Date  . 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 Hemorrhoid surgery    . Robot assisted laparoscopic radical prostatectomy N/A 05/01/2014    Procedure: ROBOTIC ASSISTED LAPAROSCOPIC RADICAL PROSTATECTOMY WITH INDOCYANINE GREEN DYE;  Surgeon: Leonard Frock, MD;  Location: [facility];  Service: Urology;  Laterality: N/A;  . Lymphadenectomy Bilateral  05/01/2014    Procedure: LYMPHADENECTOMY;  Surgeon: Leonard Frock, MD;  Location: [facility];  Service: Urology;  Laterality: Bilateral;  . Esophagogastroduodenoscopy      Done at [facility]  . Gi prostate biopsy     Social History:  reports that he has never smoked. He has never used smokeless tobacco. He reports that he does not drink alcohol or use illicit drugs.  Allergies  Allergen Reactions  . Dicloxacillin Rash  . Cayenne Pepper [Cayenne] Swelling    Family History:  Family History  Problem Relation Age of Onset  . Heart attack Father   . Diabetes Mother   . Diabetes Brother      Prior to Admission medications   Medication Sig Start Date End Date Taking? Authorizing Provider  allopurinol (ZYLOPRIM) 300 MG tablet TAKE 1 TABLET BY MOUTH DAILY TO PREVENT GOUT 02/25/15  Yes Leonard Showers, MD  amLODipine-benazepril (LOTREL) 10-20 MG per capsule Take 1 capsule by mouth every morning. 08/22/14  Yes Leonard Showers, MD  aspirin 81 MG tablet Take 81 mg by mouth daily.   Yes Historical Provider, MD  atorvastatin (LIPITOR) 10 MG tablet TAKE 1 TABLET BY MOUTH EVERY DAY 06/02/15  Yes Jettie Booze, MD  esomeprazole (NEXIUM) 40 MG capsule TAKE ONE  CAPSULE BY MOUTH DAILY 01/05/15  Yes Leonard Showers, MD  FLUZONE HIGH-DOSE 0.5 ML SUSY inject 0.5 milliliter intramuscularly 07/12/15  Yes Historical Provider, MD  linagliptin (TRADJENTA) 5 MG TABS tablet Take 1 tablet (5 mg total) by mouth daily. 02/19/15  Yes Leonard Showers, MD  nitroGLYCERIN (NITROSTAT) 0.4 MG SL tablet Place 0.4 mg under the tongue every 5 (five) minutes as needed for chest pain.   Yes Historical Provider, MD  ondansetron (ZOFRAN ODT) 4 MG disintegrating tablet Take 1 tablet (4 mg total) by mouth every 8 (eight) hours as needed for nausea or vomiting. 08/04/15  Yes Kristen N Ward, DO  oxyCODONE-acetaminophen (PERCOCET/ROXICET) 5-325 MG per tablet Take 1-2 tablets by mouth every 6 (six) hours as needed. Patient taking differently: Take 1-2  tablets by mouth every 6 (six) hours as needed for moderate pain.  08/04/15  Yes Kristen N Ward, DO  traMADol (ULTRAM) 50 MG tablet Take 1 tablet (50 mg total) by mouth every 6 (six) hours as needed. Patient taking differently: Take 50 mg by mouth every 6 (six) hours as needed for moderate pain.  07/15/15  Yes Jerene Bears, MD  triamterene-hydrochlorothiazide (MAXZIDE-25) 37.5-25 MG per tablet Take 1 tablet by mouth daily. 07/23/15  Yes Fay Records, MD   Physical Exam: Filed Vitals:   08/05/15 1042 08/05/15 1126 08/05/15 1224 08/05/15 1300  BP:  151/69 168/78 162/82  Pulse:  89 85 92  Temp:  98.6 F (37 C)    TempSrc:  Oral    Resp: 19 25 22 22   SpO2:  100% 94% 95%    Physical Exam  Constitutional: Appears well-developed and well-nourished. No distress.  HENT: Normocephalic. No tonsillar erythema or exudates Eyes: Conjunctivae are normal. No scleral icterus.  Neck: Normal ROM. Neck supple. No JVD. No tracheal deviation. No thyromegaly.  CVS: RRR, S1/S2 +, no murmurs, no gallops, no carotid bruit.  Pulmonary: Effort and breath sounds normal, no stridor, rhonchi, wheezes, rales.  Abdominal: distended, tender to deep palpation in med abdomen, sluggish bowel sounds, no rebound or guarding .  Musculoskeletal: Normal range of motion. Appreciate pedal edema but no tenderness   Lymphadenopathy: No lymphadenopathy noted, cervical, inguinal. Neuro: Alert. Normal reflexes, muscle tone coordination. No focal neurologic deficits. Skin: Skin is warm and dry. No rash noted.  No erythema. No pallor.  Psychiatric: Normal mood and affect. Behavior, judgment, thought content normal.   Labs on Admission:  Basic Metabolic Panel:  Recent Labs Lab 08/04/15 0445 08/05/15 0915  NA 137 135  K 3.5 3.8  CL 101 99*  CO2 26 22  GLUCOSE 158* 157*  BUN 13 16  CREATININE 0.99 1.03  CALCIUM 9.5 9.2   Liver Function Tests:  Recent Labs Lab 08/04/15 0445 08/05/15 0915  AST 18 21  ALT 16* 18  ALKPHOS  73 65  BILITOT 0.6 0.7  PROT 8.0 7.6  ALBUMIN 4.1 3.8    Recent Labs Lab 08/04/15 0445 08/05/15 0915  LIPASE 14* 11*   No results for input(s): AMMONIA in the last 168 hours. CBC:  Recent Labs Lab 08/04/15 0445 08/05/15 1040  WBC 13.8* 10.5  NEUTROABS 10.9* 7.4  HGB 13.0 14.0  HCT 39.3 41.4  MCV 90.6 90.8  PLT 288 323   Cardiac Enzymes:  Recent Labs Lab 08/04/15 0445  TROPONINI <0.03   BNP: Invalid input(s): POCBNP CBG: No results for input(s): GLUCAP in the last 168 hours.  If 7PM-7AM, please contact night-coverage [email or web address] Password [facility] 08/05/2015,  2:01 PM

## 2015-08-06 ENCOUNTER — Inpatient Hospital Stay (HOSPITAL_COMMUNITY): Payer: No Typology Code available for payment source

## 2015-08-06 ENCOUNTER — Encounter (HOSPITAL_COMMUNITY): Payer: Self-pay | Admitting: Radiology

## 2015-08-06 LAB — CBC
HCT: 38.6 % — ABNORMAL LOW (ref 39.0–52.0)
HEMOGLOBIN: 13 g/dL (ref 13.0–17.0)
MCH: 31 pg (ref 26.0–34.0)
MCHC: 33.7 g/dL (ref 30.0–36.0)
MCV: 91.9 fL (ref 78.0–100.0)
PLATELETS: 302 10*3/uL (ref 150–400)
RBC: 4.2 MIL/uL — AB (ref 4.22–5.81)
RDW: 15 % (ref 11.5–15.5)
WBC: 9.1 10*3/uL (ref 4.0–10.5)

## 2015-08-06 LAB — COMPREHENSIVE METABOLIC PANEL
ALT: 16 U/L — ABNORMAL LOW (ref 17–63)
ANION GAP: 8 (ref 5–15)
AST: 16 U/L (ref 15–41)
Albumin: 3.3 g/dL — ABNORMAL LOW (ref 3.5–5.0)
Alkaline Phosphatase: 55 U/L (ref 38–126)
BUN: 19 mg/dL (ref 6–20)
CALCIUM: 8.7 mg/dL — AB (ref 8.9–10.3)
CHLORIDE: 102 mmol/L (ref 101–111)
CO2: 27 mmol/L (ref 22–32)
CREATININE: 1.23 mg/dL (ref 0.61–1.24)
GFR, EST NON AFRICAN AMERICAN: 59 mL/min — AB (ref >60)
Glucose, Bld: 120 mg/dL — ABNORMAL HIGH (ref 65–99)
Potassium: 3.8 mmol/L (ref 3.5–5.1)
SODIUM: 137 mmol/L (ref 135–145)
Total Bilirubin: 0.7 mg/dL (ref 0.3–1.2)
Total Protein: 6.9 g/dL (ref 6.5–8.1)

## 2015-08-06 LAB — GLUCOSE, CAPILLARY
Glucose-Capillary: 95 mg/dL (ref 65–99)
Glucose-Capillary: 113 mg/dL — ABNORMAL HIGH (ref 65–99)

## 2015-08-06 MED ORDER — INSULIN ASPART 100 UNIT/ML ~~LOC~~ SOLN
0.0000 [IU] | SUBCUTANEOUS | Status: DC
  Administered 2015-08-07 – 2015-08-08 (×3): 1 [IU] via SUBCUTANEOUS
  Administered 2015-08-08: 2 [IU] via SUBCUTANEOUS
  Administered 2015-08-08: 1 [IU] via SUBCUTANEOUS
  Administered 2015-08-08: 2 [IU] via SUBCUTANEOUS
  Administered 2015-08-09: 1 [IU] via SUBCUTANEOUS
  Administered 2015-08-09: 2 [IU] via SUBCUTANEOUS
  Administered 2015-08-09 (×2): 1 [IU] via SUBCUTANEOUS
  Administered 2015-08-09: 2 [IU] via SUBCUTANEOUS
  Administered 2015-08-09 – 2015-08-10 (×2): 1 [IU] via SUBCUTANEOUS
  Administered 2015-08-10: 2 [IU] via SUBCUTANEOUS
  Administered 2015-08-10: 1 [IU] via SUBCUTANEOUS
  Administered 2015-08-10 – 2015-08-12 (×2): 2 [IU] via SUBCUTANEOUS
  Administered 2015-08-13 – 2015-08-14 (×4): 1 [IU] via SUBCUTANEOUS

## 2015-08-06 MED ORDER — IOHEXOL 300 MG/ML  SOLN
100.0000 mL | Freq: Once | INTRAMUSCULAR | Status: AC | PRN
  Administered 2015-08-06: 100 mL via INTRAVENOUS

## 2015-08-06 MED ORDER — PROMETHAZINE HCL 25 MG/ML IJ SOLN
12.5000 mg | Freq: Once | INTRAMUSCULAR | Status: AC
  Administered 2015-08-06: 12.5 mg via INTRAVENOUS
  Filled 2015-08-06: qty 1

## 2015-08-06 MED ORDER — KCL IN DEXTROSE-NACL 20-5-0.45 MEQ/L-%-% IV SOLN
INTRAVENOUS | Status: DC
  Administered 2015-08-06 – 2015-08-10 (×10): via INTRAVENOUS
  Filled 2015-08-06 (×11): qty 1000

## 2015-08-06 MED ORDER — HYDRALAZINE HCL 20 MG/ML IJ SOLN: 5.0000 mg | Freq: Four times a day (QID) | INTRAMUSCULAR | Status: DC | PRN

## 2015-08-06 NOTE — Progress Notes (Signed)
  Subjective: He is still having nausea, vomited earlier, No pain.  They were unable to get the NG in and are awaiting IR to try and get it in.    Objective: Vital signs in last 24 hours: Temp:  [98 F (36.7 C)-98.6 F (37 C)] 98.2 F (36.8 C) (09/21 0502) Pulse Rate:  [79-100] 96 (09/21 0502) Resp:  [16-27] 16 (09/21 0502) BP: (131-168)/(69-88) 152/82 mmHg (09/21 0502) SpO2:  [92 %-100 %] 94 % (09/21 0502) Weight:  [128.685 kg (283 lb 11.2 oz)] 128.685 kg (283 lb 11.2 oz) (09/20 1510)  500 emesis recorded Afebrile, VSS Labs OK Intake/Output from previous day: 09/20 0701 - 09/21 0700 In: -  Out: 500 [Emesis/NG output:500] Intake/Output this shift:    General appearance: alert, cooperative and no distress GI: soft, non-tender; bowel sounds normal; no masses,  no organomegaly  Lab Results:   Recent Labs  08/05/15 1605 08/06/15 0400  WBC 9.0 9.1  HGB 13.8 13.0  HCT 39.9 38.6*  PLT 309 302    BMET  Recent Labs  08/05/15 1605 08/06/15 0400  NA 137 137  K 3.6 3.8  CL 100* 102  CO2 27 27  GLUCOSE 130* 120*  BUN 18 19  CREATININE 1.16 1.23  CALCIUM 9.3 8.7*   PT/INR  Recent Labs  08/05/15 1605  LABPROT 15.1  INR 1.18     Recent Labs Lab 08/04/15 0445 08/05/15 0915 08/05/15 1605 08/06/15 0400  AST 18 21 20 16   ALT 16* 18 17 16*  ALKPHOS 73 65 67 55  BILITOT 0.6 0.7 0.8 0.7  PROT 8.0 7.6 8.0 6.9  ALBUMIN 4.1 3.8 3.9 3.3*     Lipase     Component Value Date/Time   LIPASE 11* 08/05/2015 0915     Studies/Results: Dg Abd Acute W/chest  08/05/2015   CLINICAL DATA:  Nausea and vomiting for 2-3 days.  EXAM: DG ABDOMEN ACUTE W/ 1V CHEST  COMPARISON:  April 24, 2014  FINDINGS: There is mild atelectasis of the right lung base. The heart size is mildly enlarged. The aorta is tortuous. There is no pleural effusion. There are multiple dilated small bowel loops in the abdomen. Residual contrast is identified in the colon. There is scoliosis of spine.   IMPRESSION: Multiple dilated small bowel loops. The findings are consistent with early small bowel obstruction or partial small bowel obstruction.  Mild atelectasis of right lung base.   Electronically Signed   By: [name] M.D.   On: 08/05/2015 12:25    Medications: . enoxaparin (LOVENOX) injection  60 mg Subcutaneous Q24H  . hydrALAZINE  5 mg Intravenous Q6H  . insulin aspart  0-9 Units Subcutaneous TID WC  . pantoprazole (PROTONIX) IV  40 mg Intravenous Q12H    Assessment/Plan SBO  Ongoing nausea and vomiting, dehydration Hx of Type II diabetes  Hx of atrophic gastritis/GERD/Hiatal hernia Hx of hyperplastic gastric polyp Richter hernia on CT without obstruction 08/01/15 Hx of gastric polyp Hx of diverticulosis, but no hx of diverticulitis Prostatic cancer with robotic prostatectomy on 04/2014 Hypertension Cardiac Murmur Mild MR OSA on CPAP Body mass index is 41.5 Gout Antibiotics:  None DVT: Lovenox/SCD   Plan:  Ongoing nausea, with some vomiting, no pain.  Await IR  NG tube placement.  I will talk with Dr. Kieth Brightly, we may need to repeat CT.    LOS: 1 day    JENNINGS,WILLARD 08/06/2015

## 2015-08-06 NOTE — Progress Notes (Signed)
[facility] PROGRESS NOTE  ZAKARI BATHE WHQ:[id number] DOB: [birth year] DOA: 08/05/2015 PCP: [name], MD  Assessment/Plan: Principal Problem:   SBO (small bowel obstruction) - General surgery on board and managing - continue supportive therapy - since pt is npo will place on D5 1/2 NS with 20 meq of KCL    Abdominal wall fluid collections - discussed with IR specialist this am who recommends repeating CT scan  - placed order to repeat ct scan of abd and pelvis with IV contrast. No oral contrast  Active Problems:   Dyslipidemia   Obstructive sleep apnea - stable continue home cpap   Essential hypertension - Currently on hydralazine   CAD (coronary artery disease) - stable no chest pain reported.   History of prostate cancer   Diabetes mellitus type 2, controlled, without complications - On SSI, will place on MIVF with d5 in it and change SSI to every 4 hours while patient is nothing by mouth   Nausea and vomiting - NG tube in place, continue supportive therapy   Gout: Stable   GERD (gastroesophageal reflux disease) - Stable continue PPI  Code Status: Full Family Communication: No family at bedside  Disposition Plan: Pending improvement in condition   Consultants:  General surgery   procedures: None  Antibiotics:  None   HPI/Subjective: Patient has no new complaints. Reports of pain is tolerable Objective: Filed Vitals:   08/06/15 1341  BP: 169/81  Pulse: 90  Temp: 98.8 F (37.1 C)  Resp: 20   No intake or output data in the 24 hours ending 08/06/15 1439 Filed Weights   08/05/15 1510  Weight: 128.685 kg (283 lb 11.2 oz)    Exam:   General: Patient is alert and awake  Cardiovascular: regular rate and rhythm no murmurs or rubs   Respiratory: clear to auscultation bilaterally, no wheezes   Abdomen: distended, no guarding, hypoactive bowel sound   Musculoskeletal: No cyanosis or clubbing    Data Reviewed: Basic Metabolic  Panel:  Recent Labs Lab 08/04/15 0445 08/05/15 0915 08/05/15 1605 08/06/15 0400  NA 137 135 137 137  K 3.5 3.8 3.6 3.8  CL 101 99* 100* 102  CO2 26 22 27 27   GLUCOSE 158* 157* 130* 120*  BUN 13 16 18 19   CREATININE 0.99 1.03 1.16 1.23  CALCIUM 9.5 9.2 9.3 8.7*  MG  --   --  2.2  --   PHOS  --   --  2.8  --    Liver Function Tests:  Recent Labs Lab 08/04/15 0445 08/05/15 0915 08/05/15 1605 08/06/15 0400  AST 18 21 20 16   ALT 16* 18 17 16*  ALKPHOS 73 65 67 55  BILITOT 0.6 0.7 0.8 0.7  PROT 8.0 7.6 8.0 6.9  ALBUMIN 4.1 3.8 3.9 3.3*    Recent Labs Lab 08/04/15 0445 08/05/15 0915  LIPASE 14* 11*   No results for input(s): AMMONIA in the last 168 hours. CBC:  Recent Labs Lab 08/04/15 0445 08/05/15 1040 08/05/15 1605 08/06/15 0400  WBC 13.8* 10.5 9.0 9.1  NEUTROABS 10.9* 7.4 5.0  --   HGB 13.0 14.0 13.8 13.0  HCT 39.3 41.4 39.9 38.6*  MCV 90.6 90.8 90.7 91.9  PLT 288 323 309 302   Cardiac Enzymes:  Recent Labs Lab 08/04/15 0445  TROPONINI <0.03   BNP (last 3 results) No results for input(s): BNP in the last 8760 hours.  ProBNP (last 3 results) No results for input(s): PROBNP in the last 8760  hours.  CBG:  Recent Labs Lab 08/05/15 1640 08/06/15 0742  GLUCAP 126* 95    No results found for this or any previous visit (from the past 240 hour(s)).   Studies: Dg Abd Acute W/chest  08/05/2015   CLINICAL DATA:  Nausea and vomiting for 2-3 days.  EXAM: DG ABDOMEN ACUTE W/ 1V CHEST  COMPARISON:  April 24, 2014  FINDINGS: There is mild atelectasis of the right lung base. The heart size is mildly enlarged. The aorta is tortuous. There is no pleural effusion. There are multiple dilated small bowel loops in the abdomen. Residual contrast is identified in the colon. There is scoliosis of spine.  IMPRESSION: Multiple dilated small bowel loops. The findings are consistent with early small bowel obstruction or partial small bowel obstruction.  Mild  atelectasis of right lung base.   Electronically Signed   By: [name] M.D.   On: 08/05/2015 12:25   Dg Loyce Dys Tube Plc W/fl W/rad  08/06/2015   CLINICAL DATA:  69 year old male with small bowel obstruction in need of nasogastric tube placement.  EXAM: NASO G TUBE PLACEMENT WITH FL AND WITH RAD  CONTRAST:  None  FLUOROSCOPY TIME:  If the device does not provide the exposure index:  Fluoroscopy Time (in minutes and seconds):  5 minutes and 2 seconds  Number of Acquired Images:  1  COMPARISON:  No priors.  FINDINGS: Fluoroscopic guidance was utilized to pass a feeding tube into the stomach over a guidewire. Proper placement of the tube was confirmed with fluoroscopy. No complications.  IMPRESSION: 1. Successful fluoroscopic guided placement of feeding tube into the body of the stomach.   Electronically Signed   By: Vinnie Langton M.D.   On: 08/06/2015 13:17    Scheduled Meds: . enoxaparin (LOVENOX) injection  60 mg Subcutaneous Q24H  . hydrALAZINE  5 mg Intravenous Q6H  . insulin aspart  0-9 Units Subcutaneous Q4H  . pantoprazole (PROTONIX) IV  40 mg Intravenous Q12H   Continuous Infusions: . dextrose 5 % and 0.45 % NaCl with KCl 20 mEq/L 100 mL/hr at 08/06/15 1428    Time spent: > 35 minutes   Velvet Bathe  [facility] Pager [phone] If 7PM-7AM, please contact night-coverage at [email or web address], password [facility] 08/06/2015, 2:39 PM  LOS: 1 day

## 2015-08-06 NOTE — Clinical Documentation Improvement (Signed)
General Surgery Internal Medicine  Can the BMI of 41.5 be further specified with an associated condition?   Obesity, Morbid Obesity, Overweight  Other  Clinically Undetermined  Document any associated diagnoses/conditions.  Supporting Information:  BMI of 41.5, 41.7 documented  Please exercise your independent, professional judgment when responding. A specific answer is not anticipated or expected.  Thank You,  Zoila Shutter BSN, Eugenio Saenz [phone]

## 2015-08-07 ENCOUNTER — Encounter (HOSPITAL_COMMUNITY): Payer: Self-pay

## 2015-08-07 ENCOUNTER — Inpatient Hospital Stay (HOSPITAL_COMMUNITY): Payer: No Typology Code available for payment source | Admitting: Certified Registered Nurse Anesthetist

## 2015-08-07 ENCOUNTER — Encounter (HOSPITAL_COMMUNITY)
Admission: EM | Disposition: A | Discharge status: Home / Self Care | Payer: Self-pay | Source: Home / Self Care | Attending: Internal Medicine

## 2015-08-07 HISTORY — PX: INCISIONAL HERNIA REPAIR: SHX193

## 2015-08-07 LAB — BASIC METABOLIC PANEL
ANION GAP: 6 (ref 5–15)
BUN: 12 mg/dL (ref 6–20)
CALCIUM: 8.5 mg/dL — AB (ref 8.9–10.3)
CHLORIDE: 105 mmol/L (ref 101–111)
CO2: 26 mmol/L (ref 22–32)
Creatinine, Ser: 1.07 mg/dL (ref 0.61–1.24)
GFR calc non Af Amer: >60 mL/min (ref >60)
GLUCOSE: 129 mg/dL — AB (ref 65–99)
POTASSIUM: 3.3 mmol/L — AB (ref 3.5–5.1)
Sodium: 137 mmol/L (ref 135–145)

## 2015-08-07 LAB — GLUCOSE, CAPILLARY
GLUCOSE-CAPILLARY: 119 mg/dL — AB (ref 65–99)
Glucose-Capillary: 111 mg/dL — ABNORMAL HIGH (ref 65–99)
Glucose-Capillary: 129 mg/dL — ABNORMAL HIGH (ref 65–99)

## 2015-08-07 LAB — MRSA PCR SCREENING: MRSA BY PCR: NEGATIVE

## 2015-08-07 LAB — HEMOGLOBIN A1C
Hgb A1c MFr Bld: 6.6 % — ABNORMAL HIGH (ref 4.8–5.6)
Mean Plasma Glucose: 143 mg/dL

## 2015-08-07 LAB — MAGNESIUM: Magnesium: 2 mg/dL (ref 1.7–2.4)

## 2015-08-07 SURGERY — REPAIR, HERNIA, INCISIONAL, LAPAROSCOPIC
Anesthesia: General | Site: Abdomen

## 2015-08-07 MED ORDER — PHENYLEPHRINE HCL 10 MG/ML IJ SOLN
INTRAMUSCULAR | Status: DC | PRN
  Administered 2015-08-07 (×4): 40 ug via INTRAVENOUS

## 2015-08-07 MED ORDER — FENTANYL CITRATE (PF) 250 MCG/5ML IJ SOLN
INTRAMUSCULAR | Status: AC
  Filled 2015-08-07: qty 25

## 2015-08-07 MED ORDER — KETOROLAC TROMETHAMINE 30 MG/ML IJ SOLN: 30.0000 mg | Freq: Four times a day (QID) | INTRAMUSCULAR | Status: DC | PRN

## 2015-08-07 MED ORDER — HYDROMORPHONE HCL 1 MG/ML IJ SOLN
INTRAMUSCULAR | Status: AC
  Filled 2015-08-07: qty 1

## 2015-08-07 MED ORDER — LACTATED RINGERS IV SOLN
INTRAVENOUS | Status: DC | PRN
  Administered 2015-08-07: 13:00:00 via INTRAVENOUS

## 2015-08-07 MED ORDER — GLYCOPYRROLATE 0.2 MG/ML IJ SOLN
INTRAMUSCULAR | Status: AC
  Filled 2015-08-07: qty 4

## 2015-08-07 MED ORDER — DEXAMETHASONE SODIUM PHOSPHATE 10 MG/ML IJ SOLN
INTRAMUSCULAR | Status: AC
  Filled 2015-08-07: qty 1

## 2015-08-07 MED ORDER — PROPOFOL 10 MG/ML IV BOLUS
INTRAVENOUS | Status: DC | PRN
  Administered 2015-08-07: 200 mg via INTRAVENOUS

## 2015-08-07 MED ORDER — DIPHENHYDRAMINE HCL 12.5 MG/5ML PO ELIX: 12.5000 mg | ORAL_SOLUTION | Freq: Four times a day (QID) | ORAL | Status: DC | PRN

## 2015-08-07 MED ORDER — HYDROMORPHONE HCL 1 MG/ML IJ SOLN
0.2500 mg | INTRAMUSCULAR | Status: DC | PRN
  Administered 2015-08-07: 0.5 mg via INTRAVENOUS

## 2015-08-07 MED ORDER — MIDAZOLAM HCL 2 MG/2ML IJ SOLN
INTRAMUSCULAR | Status: AC
  Filled 2015-08-07: qty 4

## 2015-08-07 MED ORDER — 0.9 % SODIUM CHLORIDE (POUR BTL) OPTIME
TOPICAL | Status: DC | PRN
  Administered 2015-08-07: 1000 mL

## 2015-08-07 MED ORDER — ONDANSETRON HCL 4 MG/2ML IJ SOLN
INTRAMUSCULAR | Status: DC | PRN
  Administered 2015-08-07: 4 mg via INTRAVENOUS

## 2015-08-07 MED ORDER — NEOSTIGMINE METHYLSULFATE 10 MG/10ML IV SOLN
INTRAVENOUS | Status: DC | PRN
  Administered 2015-08-07: 5 mg via INTRAVENOUS

## 2015-08-07 MED ORDER — LABETALOL HCL 5 MG/ML IV SOLN
INTRAVENOUS | Status: AC
  Filled 2015-08-07: qty 4

## 2015-08-07 MED ORDER — PROPOFOL 10 MG/ML IV BOLUS
INTRAVENOUS | Status: AC
Start: 2015-08-07 — End: 2015-08-07
  Filled 2015-08-07: qty 20

## 2015-08-07 MED ORDER — LACTATED RINGERS IR SOLN
Status: DC | PRN
  Administered 2015-08-07: 1000 mL

## 2015-08-07 MED ORDER — SODIUM CHLORIDE 0.9 % IJ SOLN: 9.0000 mL | INTRAMUSCULAR | Status: DC | PRN

## 2015-08-07 MED ORDER — MORPHINE SULFATE 1 MG/ML IV SOLN
INTRAVENOUS | Status: DC
  Administered 2015-08-07: 16:00:00 via INTRAVENOUS
  Administered 2015-08-07: 10 mg via INTRAVENOUS
  Administered 2015-08-07: 22:00:00 via INTRAVENOUS
  Administered 2015-08-08: 27.6 mg via INTRAVENOUS
  Administered 2015-08-08: 2 mg via INTRAVENOUS
  Administered 2015-08-08: 16:00:00 via INTRAVENOUS
  Administered 2015-08-08: 13 mg via INTRAVENOUS
  Administered 2015-08-08: 5 mg via INTRAVENOUS
  Administered 2015-08-08: 0 mg via INTRAVENOUS
  Administered 2015-08-08: 15.44 mg via INTRAVENOUS
  Administered 2015-08-08: 02:00:00 via INTRAVENOUS
  Administered 2015-08-09: 9 mg via INTRAVENOUS
  Administered 2015-08-09: 4 mg via INTRAVENOUS
  Administered 2015-08-09: 2 mg via INTRAVENOUS
  Administered 2015-08-09: 0 mg via INTRAVENOUS
  Administered 2015-08-10: 12:00:00 via INTRAVENOUS
  Administered 2015-08-10: 1 mg via INTRAVENOUS
  Administered 2015-08-10: 04:00:00 via INTRAVENOUS
  Administered 2015-08-10: 21 mg via INTRAVENOUS
  Administered 2015-08-10: 9.43 mg via INTRAVENOUS
  Administered 2015-08-10: 2 mg via INTRAVENOUS
  Administered 2015-08-10: 0 mg via INTRAVENOUS
  Administered 2015-08-11: 5 mg via INTRAVENOUS
  Administered 2015-08-11: 4 mg via INTRAVENOUS
  Administered 2015-08-11: 0 mg via INTRAVENOUS
  Administered 2015-08-11: 15:00:00 via INTRAVENOUS
  Administered 2015-08-11: 13.41 mg via INTRAVENOUS
  Administered 2015-08-11: 3 mg via INTRAVENOUS
  Administered 2015-08-12: 6 mg via INTRAVENOUS
  Administered 2015-08-12: 1 mg via INTRAVENOUS
  Filled 2015-08-07 (×6): qty 25

## 2015-08-07 MED ORDER — LABETALOL HCL 5 MG/ML IV SOLN
5.0000 mg | INTRAVENOUS | Status: DC | PRN
  Administered 2015-08-07 (×3): 5 mg via INTRAVENOUS
  Filled 2015-08-07 (×4): qty 4

## 2015-08-07 MED ORDER — ONDANSETRON HCL 4 MG/2ML IJ SOLN
INTRAMUSCULAR | Status: AC
  Filled 2015-08-07: qty 2

## 2015-08-07 MED ORDER — BUPIVACAINE-EPINEPHRINE 0.25% -1:200000 IJ SOLN
INTRAMUSCULAR | Status: DC | PRN
  Administered 2015-08-07: 30 mL

## 2015-08-07 MED ORDER — NALOXONE HCL 0.4 MG/ML IJ SOLN: 0.4000 mg | INTRAMUSCULAR | Status: DC | PRN

## 2015-08-07 MED ORDER — BUPIVACAINE-EPINEPHRINE (PF) 0.25% -1:200000 IJ SOLN
INTRAMUSCULAR | Status: AC
Start: 2015-08-07 — End: 2015-08-07
  Filled 2015-08-07: qty 30

## 2015-08-07 MED ORDER — PROPOFOL 10 MG/ML IV BOLUS
INTRAVENOUS | Status: AC
  Filled 2015-08-07: qty 20

## 2015-08-07 MED ORDER — LACTATED RINGERS IV SOLN
INTRAVENOUS | Status: DC | PRN
  Administered 2015-08-07: 14:00:00 via INTRAVENOUS

## 2015-08-07 MED ORDER — LIDOCAINE HCL (CARDIAC) 20 MG/ML IV SOLN
INTRAVENOUS | Status: AC
  Filled 2015-08-07: qty 5

## 2015-08-07 MED ORDER — FENTANYL CITRATE (PF) 100 MCG/2ML IJ SOLN
INTRAMUSCULAR | Status: DC | PRN
  Administered 2015-08-07: 100 ug via INTRAVENOUS
  Administered 2015-08-07 (×3): 50 ug via INTRAVENOUS

## 2015-08-07 MED ORDER — MIDAZOLAM HCL 5 MG/5ML IJ SOLN
INTRAMUSCULAR | Status: DC | PRN
  Administered 2015-08-07 (×2): 0.5 mg via INTRAVENOUS
  Administered 2015-08-07: 1 mg via INTRAVENOUS

## 2015-08-07 MED ORDER — LACTATED RINGERS IV SOLN
INTRAVENOUS | Status: DC
  Administered 2015-08-07: 1000 mL via INTRAVENOUS

## 2015-08-07 MED ORDER — ROCURONIUM BROMIDE 100 MG/10ML IV SOLN
INTRAVENOUS | Status: DC | PRN
  Administered 2015-08-07: 30 mg via INTRAVENOUS

## 2015-08-07 MED ORDER — LIDOCAINE HCL (CARDIAC) 20 MG/ML IV SOLN
INTRAVENOUS | Status: DC | PRN
  Administered 2015-08-07: 100 mg via INTRAVENOUS

## 2015-08-07 MED ORDER — MORPHINE SULFATE 1 MG/ML IV SOLN
INTRAVENOUS | Status: AC
  Filled 2015-08-07: qty 25

## 2015-08-07 MED ORDER — LACTATED RINGERS IV SOLN: INTRAVENOUS | Status: DC

## 2015-08-07 MED ORDER — HYDROMORPHONE HCL 1 MG/ML IJ SOLN
0.2500 mg | INTRAMUSCULAR | Status: DC | PRN
  Administered 2015-08-07 (×4): 0.5 mg via INTRAVENOUS

## 2015-08-07 MED ORDER — ROCURONIUM BROMIDE 100 MG/10ML IV SOLN
INTRAVENOUS | Status: AC
  Filled 2015-08-07: qty 1

## 2015-08-07 MED ORDER — GLYCOPYRROLATE 0.2 MG/ML IJ SOLN
INTRAMUSCULAR | Status: DC | PRN
  Administered 2015-08-07: .8 mg via INTRAVENOUS

## 2015-08-07 MED ORDER — DIPHENHYDRAMINE HCL 50 MG/ML IJ SOLN: 12.5000 mg | Freq: Four times a day (QID) | INTRAMUSCULAR | Status: DC | PRN

## 2015-08-07 MED ORDER — VANCOMYCIN HCL 10 G IV SOLR
1500.0000 mg | INTRAVENOUS | Status: AC
  Administered 2015-08-07: 1500 mg via INTRAVENOUS
  Filled 2015-08-07 (×2): qty 1500

## 2015-08-07 MED ORDER — SUCCINYLCHOLINE CHLORIDE 20 MG/ML IJ SOLN
INTRAMUSCULAR | Status: DC | PRN
Start: 2015-08-07 — End: 2015-08-07
  Administered 2015-08-07: 160 mg via INTRAVENOUS

## 2015-08-07 SURGICAL SUPPLY — 46 items
APPLIER CLIP 5 13 M/L LIGAMAX5 (MISCELLANEOUS)
BINDER ABDOMINAL 12 ML 46-62 (SOFTGOODS) IMPLANT
CHLORAPREP W/TINT 26ML (MISCELLANEOUS) ×4 IMPLANT
CLIP APPLIE 5 13 M/L LIGAMAX5 (MISCELLANEOUS) IMPLANT
COVER SURGICAL LIGHT HANDLE (MISCELLANEOUS) ×8 IMPLANT
DECANTER SPIKE VIAL GLASS SM (MISCELLANEOUS) IMPLANT
DEVICE SECURE STRAP 25 ABSORB (INSTRUMENTS) ×4 IMPLANT
DRAIN CHANNEL 19F RND (DRAIN) IMPLANT
DRAPE LAPAROSCOPIC ABDOMINAL (DRAPES) ×4 IMPLANT
DRAPE WARM FLUID 44X44 (DRAPE) IMPLANT
ELECT REM PT RETURN 9FT ADLT (ELECTROSURGICAL) ×8
ELECTRODE REM PT RTRN 9FT ADLT (ELECTROSURGICAL) ×4 IMPLANT
EVACUATOR SILICONE 100CC (DRAIN) IMPLANT
GLOVE BIO SURGEON STRL SZ7 (GLOVE) IMPLANT
GLOVE BIOGEL PI IND STRL 7.0 (GLOVE) ×2 IMPLANT
GLOVE BIOGEL PI INDICATOR 7.0 (GLOVE) ×2
GLOVE SURG SS PI 7.0 STRL IVOR (GLOVE) ×8 IMPLANT
GOWN STRL REUS W/TWL LRG LVL3 (GOWN DISPOSABLE) ×4 IMPLANT
GOWN STRL REUS W/TWL XL LVL3 (GOWN DISPOSABLE) ×8 IMPLANT
KIT BASIN OR (CUSTOM PROCEDURE TRAY) ×8 IMPLANT
LIQUID BAND (GAUZE/BANDAGES/DRESSINGS) IMPLANT
MESH VENTRALIGHT ST 4X6IN (Mesh General) ×4 IMPLANT
PEN SKIN MARKING BROAD (MISCELLANEOUS) IMPLANT
SCISSORS LAP 5X35 DISP (ENDOMECHANICALS) ×4 IMPLANT
SET IRRIG TUBING LAPAROSCOPIC (IRRIGATION / IRRIGATOR) ×4 IMPLANT
SHEARS HARMONIC ACE PLUS 36CM (ENDOMECHANICALS) IMPLANT
SLEEVE XCEL OPT CAN 5 100 (ENDOMECHANICALS) ×12 IMPLANT
SUT ETHILON 2 0 PS N (SUTURE) IMPLANT
SUT MNCRL AB 4-0 PS2 18 (SUTURE) ×8 IMPLANT
SUT PROLENE 0 CT 1 CR/8 (SUTURE) ×4 IMPLANT
SUT SILK 2 0 (SUTURE)
SUT SILK 2 0 SH CR/8 (SUTURE) IMPLANT
SUT SILK 2-0 18XBRD TIE 12 (SUTURE) IMPLANT
SUT SILK 3 0 (SUTURE)
SUT SILK 3 0 SH CR/8 (SUTURE) IMPLANT
SUT SILK 3-0 18XBRD TIE 12 (SUTURE) IMPLANT
SUT VIC AB 2-0 SH 27 (SUTURE)
SUT VIC AB 2-0 SH 27X BRD (SUTURE) IMPLANT
SYSTEM CARTER THOMASON II (TROCAR) IMPLANT
TOWEL OR 17X26 10 PK STRL BLUE (TOWEL DISPOSABLE) ×4 IMPLANT
TOWEL OR NON WOVEN STRL DISP B (DISPOSABLE) ×4 IMPLANT
TRAY FOLEY W/METER SILVER 14FR (SET/KITS/TRAYS/PACK) IMPLANT
TRAY FOLEY W/METER SILVER 16FR (SET/KITS/TRAYS/PACK) IMPLANT
TRAY LAPAROSCOPIC (CUSTOM PROCEDURE TRAY) ×4 IMPLANT
TROCAR BLADELESS OPT 5 100 (ENDOMECHANICALS) ×4 IMPLANT
TROCAR XCEL 12X100 BLDLESS (ENDOMECHANICALS) ×4 IMPLANT

## 2015-08-07 NOTE — Transfer of Care (Signed)
Immediate Anesthesia Transfer of Care Note  Patient: Leonard [name]  Procedure(s) Performed: Procedure(s): LAPAROSCOPIC INCISIONAL HERNIA repair with mesh (N/A)  Patient Location: PACU  Anesthesia Type:General  Level of Consciousness: awake, alert , oriented and patient cooperative  Airway & Oxygen Therapy: Patient Spontanous Breathing and Patient connected to face mask oxygen  Post-op Assessment: Report given to RN, Post -op Vital signs reviewed and stable and Patient moving all extremities  Post vital signs: Reviewed and stable  Last Vitals:  Filed Vitals:   08/07/15 1153  BP: 158/67  Pulse: 88  Temp: 37.1 C  Resp: 20    Complications: No apparent anesthesia complications

## 2015-08-07 NOTE — Progress Notes (Signed)
[facility] PROGRESS NOTE  ALLAH REASON TZG:[id number] DOB: 1946/08/02 DOA: 08/05/2015 PCP: [name], MD   Brief narrative: Patient is a 69 year old Caucasian male with 6 week history of abdominal discomfort. Patient currently being evaluated for SBO. Repeat CT scan for evaluation of abdominal wall fluid collection reported findings suspicious for incarcerated bowel. As such general surgery took patient to OR 08/07/2015  Assessment/Plan: Principal Problem:   SBO (small bowel obstruction) - General surgery on board and managing - continue supportive therapy - While patient is nothing by mouth continue maintenance IV fluids    Abdominal wall fluid collections - discussed with IR specialist this am who recommends repeating CT scan  - Repeat CT scan did not report abdominal wall fluid collection. Did report findings suspicious for incarcerated hernia  Active Problems:   Dyslipidemia   Obstructive sleep apnea - stable continue home cpap   Essential hypertension - Currently on hydralazine   CAD (coronary artery disease) - stable no chest pain reported.   History of prostate cancer   Diabetes mellitus type 2, controlled, without complications - On SSI, will place on MIVF with d5 in it and change SSI to every 4 hours while patient is nothing by mouth   Nausea and vomiting - NG tube in place, continue supportive therapy   Gout: Stable   GERD (gastroesophageal reflux disease) - Stable continue PPI  Code Status: Full Family Communication: No family at bedside  Disposition Plan: Pending improvement in condition   Consultants:  General surgery   procedures: None  Antibiotics:  None   HPI/Subjective: Patient was taken to or was not available for evaluation despite multiple tries to see patient.   Objective: Filed Vitals:   08/07/15 1645  BP:   Pulse:   Temp: 98.1 F (36.7 C)  Resp:     Intake/Output Summary (Last 24 hours) at 08/07/15 1654 Last data  filed at 08/07/15 1545  Gross per 24 hour  Intake   2100 ml  Output   1075 ml  Net   1025 ml   Filed Weights   08/05/15 1510  Weight: 128.685 kg (283 lb 11.2 oz)    Exam:   Vital signs reviewed. Patient was not available in floor.  Data Reviewed: Basic Metabolic Panel:  Recent Labs Lab 08/04/15 0445 08/05/15 0915 08/05/15 1605 08/06/15 0400 08/07/15 0825  NA 137 135 137 137 137  K 3.5 3.8 3.6 3.8 3.3*  CL 101 99* 100* 102 105  CO2 26 22 27 27 26   GLUCOSE 158* 157* 130* 120* 129*  BUN 13 16 18 19 12   CREATININE 0.99 1.03 1.16 1.23 1.07  CALCIUM 9.5 9.2 9.3 8.7* 8.5*  MG  --   --  2.2  --  2.0  PHOS  --   --  2.8  --   --    Liver Function Tests:  Recent Labs Lab 08/04/15 0445 08/05/15 0915 08/05/15 1605 08/06/15 0400  AST 18 21 20 16   ALT 16* 18 17 16*  ALKPHOS 73 65 67 55  BILITOT 0.6 0.7 0.8 0.7  PROT 8.0 7.6 8.0 6.9  ALBUMIN 4.1 3.8 3.9 3.3*    Recent Labs Lab 08/04/15 0445 08/05/15 0915  LIPASE 14* 11*   No results for input(s): AMMONIA in the last 168 hours. CBC:  Recent Labs Lab 08/04/15 0445 08/05/15 1040 08/05/15 1605 08/06/15 0400  WBC 13.8* 10.5 9.0 9.1  NEUTROABS 10.9* 7.4 5.0  --   HGB 13.0 14.0 13.8 13.0  HCT 39.3 41.4 39.9 38.6*  MCV 90.6 90.8 90.7 91.9  PLT 288 323 309 302   Cardiac Enzymes:  Recent Labs Lab 08/04/15 0445  TROPONINI <0.03   BNP (last 3 results) No results for input(s): BNP in the last 8760 hours.  ProBNP (last 3 results) No results for input(s): PROBNP in the last 8760 hours.  CBG:  Recent Labs Lab 08/05/15 1640 08/06/15 0742 08/06/15 1648 08/07/15 0807 08/07/15 1202  GLUCAP 126* 95 113* 119* 111*    Recent Results (from the past 240 hour(s))  MRSA PCR Screening     Status: None   Collection Time: 08/07/15  8:07 AM  Result Value Ref Range Status   MRSA by PCR NEGATIVE NEGATIVE Final    Comment:        The GeneXpert MRSA Assay (FDA approved for NASAL specimens only), is one  component of a comprehensive MRSA colonization surveillance program. It is not intended to diagnose MRSA infection nor to guide or monitor treatment for MRSA infections.      Studies: Ct Abdomen Pelvis W Contrast  08/06/2015   ADDENDUM REPORT: 08/06/2015 16:13 ADDENDUM: These results were called by telephone at the time of interpretation on 08/06/2015 at 4:12 pm to Dr. Gurney Maxin, who verbally acknowledged these results. Electronically Signed   By: Markus Daft M.D.   On: 08/06/2015 16:13  08/06/2015   CLINICAL DATA:  Evaluate abdominal wall fluid collections. History of prostate cancer.  EXAM: CT ABDOMEN AND PELVIS WITH CONTRAST  TECHNIQUE: Multidetector CT imaging of the abdomen and pelvis was performed using the standard protocol following bolus administration of intravenous contrast.  CONTRAST:  144mL OMNIPAQUE IOHEXOL 300 MG/ML  SOLN  COMPARISON:  08/01/2015 and abdominal radiographs 08/05/2015  FINDINGS: Increased volume loss at the medial left lung base with trace pleural fluid. CT tomogram images demonstrate dilated loops of small bowel throughout the abdomen which are similar to the examination on 08/05/2015.  Negative for free intraperitoneal air.  Nasogastric tube extends into the stomach body. Mild dilatation of the distal esophagus.  Again noted are numerous hypodensities throughout the liver which most likely represent cysts, largest measuring 2.2 cm. Normal appearance of the gallbladder and portal venous system is patent. No acute abnormality to the pancreas or spleen. Again noted is a small exophytic hypodensity along the superior aspect of spleen measuring up to 1.6 cm and may contain fat. No acute abnormality involving the kidneys and no evidence for hydronephrosis. Stable left adrenal nodules may represent adenomas based their low density.  Atherosclerotic calcifications in the distal abdominal aorta without aneurysm. No significant abdominal or pelvic lymphadenopathy.  Prior  prostatectomy. Small amount of fluid at the pelvis has decreased or resolved. There is oral contrast throughout the colon. The colon is decompressed. There is oral contrast in the appendix. There are dilated loops of small bowel and the previously described subcutaneous fluid collection in the right lower abdomen represents an abdominal wall hernia containing small bowel loops. Dilated small bowel loops measure up to 3.3 cm. There is concern for mild inflammatory changes in the right lower abdomen surrounding a loop of bowel on sequence 2, image 61. There may be mesenteric edema around this loop of small bowel.  Extensive facet arthropathy at L5-S1. Mild disc space narrowing and vacuum disc phenomena at L5-S1.  IMPRESSION: Persistent dilated loops of small bowel compatible with a small bowel obstruction. 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  Findings are concerning for an incarcerated hernia causing the small bowel obstruction. Mild inflammation or mesenteric edema involving a small bowel loop in the right lower abdomen.  Volume loss at the left lung base with trace left pleural fluid.  Stable left adrenal nodules may represent adenomas.  Exophytic splenic lesion with fat attenuation. This could represent a small subcapsular hamartoma.  Probable hepatic cysts.  Electronically Signed: By: Markus Daft M.D. On: 08/06/2015 16:08   Dg Loyce Dys Tube Plc W/fl W/rad  08/06/2015   CLINICAL DATA:  69 year old male with small bowel obstruction in need of nasogastric tube placement.  EXAM: NASO G TUBE PLACEMENT WITH FL AND WITH RAD  CONTRAST:  None  FLUOROSCOPY TIME:  If the device does not provide the exposure index:  Fluoroscopy Time (in minutes and seconds):  5 minutes and 2 seconds  Number of Acquired Images:  1  COMPARISON:  No priors.  FINDINGS: Fluoroscopic guidance was utilized to pass a feeding tube into the stomach over a guidewire. Proper  placement of the tube was confirmed with fluoroscopy. No complications.  IMPRESSION: 1. Successful fluoroscopic guided placement of feeding tube into the body of the stomach.   Electronically Signed   By: Vinnie Langton M.D.   On: 08/06/2015 13:17    Scheduled Meds: . enoxaparin (LOVENOX) injection  60 mg Subcutaneous Q24H  . hydrALAZINE  5 mg Intravenous Q6H  . HYDROmorphone      . HYDROmorphone      . HYDROmorphone      . insulin aspart  0-9 Units Subcutaneous Q4H  . labetalol      . morphine   Intravenous 6 times per day  . pantoprazole (PROTONIX) IV  40 mg Intravenous Q12H   Continuous Infusions: . dextrose 5 % and 0.45 % NaCl with KCl 20 mEq/L Stopped (08/07/15 1300)  . lactated ringers 1,000 mL (08/07/15 1257)  . lactated ringers      Time spent: 10 minutes   Velvet Bathe  [facility] Pager [phone] If 7PM-7AM, please contact night-coverage at [email or web address], password [email or web address] 08/07/2015, 4:54 PM  LOS: 2 days

## 2015-08-07 NOTE — Anesthesia Postprocedure Evaluation (Signed)
  Anesthesia Post-op Note  Patient: Leonard [name]  Procedure(s) Performed: Procedure(s) (LRB): LAPAROSCOPIC INCISIONAL HERNIA repair with mesh (N/A)  Patient Location: PACU  Anesthesia Type: General  Level of Consciousness: awake and alert   Airway and Oxygen Therapy: Patient Spontanous Breathing  Post-op Pain: mild  Post-op Assessment: Post-op Vital signs reviewed, Patient's Cardiovascular Status Stable, Respiratory Function Stable, Patent Airway and No signs of Nausea or vomiting  Last Vitals:  Filed Vitals:   08/07/15 1715  BP: 157/74  Pulse: 73  Temp: 36.8 C  Resp: 17    Post-op Vital Signs: stable   Complications: No apparent anesthesia complications

## 2015-08-07 NOTE — H&P (View-Only) (Signed)
Reason for Consult: Partial SBO Referring Physician: Dr. Neil Crouch  Leonard [name] is an 69 y.o. male.  HPI: Pt with a 6 week history of abdominal pain.  He was seen by GI, Dr. Hilarie Fredrickson, on 07/15/15.  He noted intermittent pain sharp upper epigastric area that comes and goes. He said it would come on anytime and last about 20-30 minutes.   He reported chills with the pain, not related to PO intake. Bowel movements were reported to be normal.  He has a history of hyperplastic gastric polyp, resected 2 years ago at [facility].  He was seen again in Ed on 08/04/15 with abdominal pain again for at least 6 weeks. He had nausea and vomiting with the episode, no diarrhea.  CT from 08/01/15, Small subcutaneous collection identified external to the fascia inRIGHT mid abdomen the level of the iliac crests, 4.5 x 1.5 x 3.6 cm in size.  He returned to the ED at [place], on 9/19 with pain, vomited on _0 15    Past Surgical History  Procedure Laterality Date  . 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 Hemorrhoid surgery    . Robot assisted laparoscopic radical prostatectomy N/A 05/01/2014    Procedure: ROBOTIC ASSISTED LAPAROSCOPIC RADICAL PROSTATECTOMY WITH INDOCYANINE GREEN DYE;  Surgeon: Alexis Frock, MD;  Location: [facility];  Service: Urology;  Laterality: N/A;  .  Lymphadenectomy Bilateral 05/01/2014    Procedure: LYMPHADENECTOMY;  Surgeon: Theodore Manny, MD;  Location: [facility];  Service: Urology;  Laterality: Bilateral;  . Esophagogastroduodenoscopy      Done at [facility]  . Gi prostate biopsy      Family History  Problem Relation Age of Onset  . Heart attack Father   . Diabetes Mother   . Diabetes Brother     Social History:  reports that he has never smoked. He has never used smokeless tobacco. He reports that he does not drink alcohol or use illicit drugs.  Allergies:  Allergies  Allergen Reactions  . Dicloxacillin Rash  . Cayenne Pepper [Cayenne] Swelling   Prior to Admission medications   Medication Sig Start  Date End Date Taking? Authorizing Estell Dillinger  allopurinol (ZYLOPRIM) 300 MG tablet TAKE 1 TABLET BY MOUTH DAILY TO PREVENT GOUT 02/25/15  Yes Mary J [name], MD  amLODipine-benazepril (LOTREL) 10-20 MG per capsule Take 1 capsule by mouth every morning. 08/22/14  Yes Mary J [name], MD  aspirin 81 MG tablet Take 81 mg by mouth daily.   Yes Historical Tomas Schamp, MD  atorvastatin (LIPITOR) 10 MG tablet TAKE 1 TABLET BY MOUTH EVERY DAY 06/02/15  Yes Jayadeep S Varanasi, MD  esomeprazole (NEXIUM) 40 MG capsule TAKE ONE CAPSULE BY MOUTH DAILY 01/05/15  Yes Mary J [name], MD  FLUZONE HIGH-DOSE 0.5 ML SUSY inject 0.5 milliliter intramuscularly 07/12/15  Yes Historical Aksh Swart, MD  linagliptin (TRADJENTA) 5 MG TABS tablet Take 1 tablet (5 mg total) by mouth daily. 02/19/15  Yes Mary J [name], MD  nitroGLYCERIN (NITROSTAT) 0.4 MG SL tablet Place 0.4 mg under the tongue every 5 (five) minutes as needed for chest pain.   Yes Historical Kiandra Sanguinetti, MD  ondansetron (ZOFRAN ODT) 4 MG disintegrating tablet Take 1 tablet (4 mg total) by mouth every 8 (eight) hours as needed for nausea or vomiting. 08/04/15  Yes Kristen N Ward, DO  oxyCODONE-acetaminophen (PERCOCET/ROXICET) 5-325 MG per tablet Take 1-2 tablets by mouth every 6 (six) hours as needed. Patient taking differently: Take 1-2 tablets by mouth every 6 (six) hours as needed for moderate pain.  08/04/15  Yes Kristen N Ward, DO  traMADol (ULTRAM) 50 MG tablet Take 1 tablet (50 mg total) by mouth every 6 (six) hours as needed. Patient taking differently: Take 50 mg by mouth every 6 (six) hours as needed for moderate pain.  07/15/15  Yes Jay M Pyrtle, MD  triamterene-hydrochlorothiazide (MAXZIDE-25) 37.5-25 MG per tablet Take 1 tablet by mouth daily. 07/23/15  Yes Paula Ross V, MD     Results for orders placed or performed during the hospital encounter of 08/05/15 (from the past 48 hour(s))  Comprehensive metabolic panel     Status: Abnormal   Collection Time: 08/05/15  9:15 AM   Result Value Ref Range   Sodium 135 135 - 145 mmol/L   Potassium 3.8 3.5 - 5.1 mmol/L   Chloride 99 (L) 101 - 111 mmol/L   CO2 22 22 - 32 mmol/L   Glucose, Bld 157 (H) 65 - 99 mg/dL   BUN 16 6 - 20 mg/dL   Creatinine, Ser 1.03 0.61 - 1.24 mg/dL   Calcium 9.2 8.9 - 10.3 mg/dL   Total Protein 7.6 6.5 - 8.1 g/dL   Albumin 3.8 3.5 - 5.0 g/dL   AST 21 15 - 41 U/L   ALT 18 17 - 63 U/L   Alkaline Phosphatase 65 38 - 126 U/L   Total Bilirubin 0.7   0.3 - 1.2 mg/dL   GFR calc non Af Amer >60 >60 mL/min   GFR calc Af Amer >60 >60 mL/min    Comment: (NOTE) The eGFR has been calculated using the CKD EPI equation. This calculation has not been validated in all clinical situations. eGFR's persistently <60 mL/min signify possible Chronic Kidney Disease.    Anion gap 14 5 - 15  Lipase, blood     Status: Abnormal   Collection Time: 08/05/15  9:15 AM  Result Value Ref Range   Lipase 11 (L) 22 - 51 U/L  CBC with Differential     Status: Abnormal   Collection Time: 08/05/15 10:40 AM  Result Value Ref Range   WBC 10.5 4.0 - 10.5 K/uL   RBC 4.56 4.22 - 5.81 MIL/uL   Hemoglobin 14.0 13.0 - 17.0 g/dL   HCT 41.4 39.0 - 52.0 %   MCV 90.8 78.0 - 100.0 fL   MCH 30.7 26.0 - 34.0 pg   MCHC 33.8 30.0 - 36.0 g/dL   RDW 14.5 11.5 - 15.5 %   Platelets 323 150 - 400 K/uL   Neutrophils Relative % 71 %   Neutro Abs 7.4 1.7 - 7.7 K/uL   Lymphocytes Relative 18 %   Lymphs Abs 1.9 0.7 - 4.0 K/uL   Monocytes Relative 11 %   Monocytes Absolute 1.2 (H) 0.1 - 1.0 K/uL   Eosinophils Relative 0 %   Eosinophils Absolute 0.0 0.0 - 0.7 K/uL   Basophils Relative 0 %   Basophils Absolute 0.0 0.0 - 0.1 K/uL  Urinalysis, Routine w reflex microscopic (not at [facility])     Status: Abnormal   Collection Time: 08/05/15 11:26 AM  Result Value Ref Range   Color, Urine AMBER (A) YELLOW    Comment: BIOCHEMICALS MAY BE AFFECTED BY COLOR   APPearance CLEAR CLEAR   Specific Gravity, Urine 1.029 1.005 - 1.030   pH 5.5 5.0 - 8.0    Glucose, UA NEGATIVE NEGATIVE mg/dL   Hgb urine dipstick TRACE (A) NEGATIVE   Bilirubin Urine SMALL (A) NEGATIVE   Ketones, ur NEGATIVE NEGATIVE mg/dL   Protein, ur 100 (A) NEGATIVE mg/dL   Urobilinogen, UA 0.2 0.0 - 1.0 mg/dL   Nitrite NEGATIVE NEGATIVE   Leukocytes, UA NEGATIVE NEGATIVE  Urine microscopic-add on     Status: Abnormal   Collection Time: 08/05/15 11:26 AM  Result Value Ref Range   Squamous Epithelial / LPF RARE RARE   WBC, UA 0-2 <3 WBC/hpf   RBC / HPF 0-2 <3 RBC/hpf   Bacteria, UA RARE RARE   Casts HYALINE CASTS (A) NEGATIVE    Dg Abd Acute W/chest  08/05/2015   CLINICAL DATA:  Nausea and vomiting for 2-3 days.  EXAM: DG ABDOMEN ACUTE W/ 1V CHEST  COMPARISON:  April 24, 2014  FINDINGS: There is mild atelectasis of the right lung base. The heart size is mildly enlarged. The aorta is tortuous. There is no pleural effusion. There are multiple dilated small bowel loops in the abdomen. Residual contrast is identified in the colon. There is scoliosis of spine.  IMPRESSION: Multiple dilated small bowel loops. The findings are consistent with early small bowel obstruction or partial small bowel obstruction.  Mild atelectasis of right lung base.   Electronically Signed   By: Wei-Chen  Lin M.D.   On: 08/05/2015 12:25    Review of Systems  Constitutional: Positive for chills, weight loss (6-7 pounds over the last week ) and malaise/fatigue. Negative for fever   and diaphoresis.  HENT: Negative.   Eyes: Negative.   Respiratory: Negative.   Cardiovascular: Negative.   Gastrointestinal: Positive for heartburn (OK on PPI), nausea, vomiting and abdominal pain. Negative for diarrhea, constipation, blood in stool and melena.  Genitourinary: Negative.   Musculoskeletal: Negative.   Skin: Negative.   Neurological: Negative.  Negative for weakness.  Endo/Heme/Allergies: Negative.   Psychiatric/Behavioral: Negative.    Blood pressure 163/87, pulse 87, temperature 98.6 F (37 C),  temperature source Oral, resp. rate 20, SpO2 92 %. Physical Exam  Constitutional: He is oriented to person, place, and time. He appears well-developed and well-nourished. No distress.  Body mass index is 41.59 kg/(m^2).   HENT:  Head: Normocephalic and atraumatic.  Nose: Nose normal.  Eyes: Conjunctivae and EOM are normal. Right eye exhibits no discharge. Left eye exhibits no discharge. No scleral icterus.  Neck: Normal range of motion. Neck supple. No JVD present. No tracheal deviation present. No thyromegaly present.  Cardiovascular: Normal rate, regular rhythm and intact distal pulses.   No murmur heard. Respiratory: Effort normal and breath sounds normal. No respiratory distress. He has no wheezes. He has no rales. He exhibits no tenderness.  GI: Soft. He exhibits no distension and no mass. There is no tenderness. There is no rebound and no guarding.  Musculoskeletal: He exhibits no edema.  Lymphadenopathy:    He has no cervical adenopathy.  Neurological: He is alert and oriented to person, place, and time. No cranial nerve deficit.  Skin: Skin is warm and dry. No rash noted. He is not diaphoretic. No erythema. No pallor.  Psychiatric: He has a normal mood and affect. His behavior is normal. Judgment and thought content normal.    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 Which is upper epigastric pain.  He has new nausea and vomiting, no BM since 9/18 with a film consistent with SBO, with a  reported Richter hernia by Dr. Jacqualyn Posey.  I would keep him NPO, hydrate, and if he gets nauseated again insert an NG.   I would let Dr. Hilarie Fredrickson know he is here also.     JENNINGS,WILLARD 08/05/2015, 2:23 PM

## 2015-08-07 NOTE — Op Note (Signed)
Preoperative diagnosis:small bowel obstruction, incisional hernia  Postoperative diagnosis: Same   Procedure: laparoscopic incisional hernia repair with mesh  Surgeon: Gurney Maxin, M.D.  Asst: none  Anesthesia: Gen.   Indications for procedure: Leonard [name] is a 69 y.o. male with symptoms of abdominal pain and obstruction on exam. His imaging confirmed incisional hernia with bowel and a transition point at the point. He was admitted, NG decompression was used and then brought down to the operating room for reduction and repair.  Description of procedure: The patient was brought into the operative suite, placed supine. Anesthesia was administered with endotracheal tube. Patient was strapped in place and foot board was secured. Both arms were tucked. All pressure points were offloaded by foam padding. Foley was placed in sterile fashion. The patient was prepped and draped in the usual sterile fashion.  A small incision was made over the left subcostal area and 62mm trocar was placed with optical entry. Pneumoperitoneum was applied with high flow low pressure. The abdominal cavity was inspected and 2 areas of scar tissue up to the abdominal wall were found. One under the RUQ scar and one area directly under the umbilicus.  1 77mm trocar was placed in the mid left abdomen and 3 additional 62mm trocars 1 in the LLQ. All trocar sites were first anesthestized with 0.25% marcaine with epi and trocars were placed under direct vision.  Blunt dissection was used to remove most of the filmy adhesions with occasional sharp dissection. Cautery was used to provide hemostasis. The hernia was identified  As 3cm in the RUQand was already reduced. A second small hernia was located directly under the umbilicus. It was slowly dissected free and reduced. The defect was about 3 cm in diameter. A 10 x 15cm ventralight mesh was inserted and used to the repair the mesh. 4 transfascial sutures were used to secure the mesh  in place and absorbable tackers were used to appose the mesh against the abdominal wall in all areas. The umbilical hernia was closed with a single 0 prolene using the suture passer  The abdominal contents were again inspected and hemostasis was intact.  0 vicryl was used to close the fascial defect of the 74mm trocar site using suture passer. Pneumoperitoneum was removed, all trocar were removed. All incisions were closed with 4-0 monocryl subcuticular stitch. The patient woke from anesthesia and was brought to PACU in stable condition.  Findings: 3cm incisional hernia and 1cm umbilical hernia  Specimen: none  Blood loss: 100cc  Local anesthesia: 30cc 0.25% marcaine w epi  Complications: none  Implant: 10x15cm ventralight mesh  Gurney Maxin, M.D. General, Bariatric, & Minimally Invasive Surgery [facility]

## 2015-08-07 NOTE — Anesthesia Procedure Notes (Signed)
Procedure Name: Intubation Date/Time: 08/07/2015 2:13 PM Performed by: Ofilia Neas Pre-anesthesia Checklist: Patient identified, Emergency Drugs available, Suction available, Patient being monitored and Timeout performed Patient Re-evaluated:Patient Re-evaluated prior to inductionOxygen Delivery Method: Circle system utilized Preoxygenation: Pre-oxygenation with 100% oxygen Intubation Type: Cricoid Pressure applied, IV induction and Rapid sequence Laryngoscope Size: Glidescope and 4 Grade View: Grade II Tube type: Oral Tube size: 8.0 mm Number of attempts: 1 Airway Equipment and Method: Video-laryngoscopy Placement Confirmation: ETT inserted through vocal cords under direct vision,  positive ETCO2 and breath sounds checked- equal and bilateral Secured at: 22 cm Tube secured with: Tape Dental Injury: Teeth and Oropharynx as per pre-operative assessment  Future Recommendations: Recommend- induction with short-acting agent, and alternative techniques readily available Comments: Elective glidescope due to body habitus, airway exam.

## 2015-08-07 NOTE — Interval H&P Note (Signed)
History and Physical Interval Note:  Attempted nonoperative management. Repeat CT 9/21 consistent with hernia causing bowel obstruction. He has had no cardinal signs of ischemia. NG tube down and functioning well Filed Vitals:   08/07/15 0529  BP: 129/109  Pulse: 100  Temp: 97.9 F (36.6 C)  Resp: 18   Ab: soft, slight distension, obese, tender in LUQ A/P SBO related to hernia -laparoscopic vs open repair of incisional hernia  08/07/2015 10:01 AM  Leonard [name]  has presented today for surgery, with the diagnosis of Hernia  The various methods of treatment have been discussed with the patient and family. After consideration of risks, benefits and other options for treatment, the patient has consented to  Procedure(s): HERNIA REPAIR INCISIONAL (N/A) as a surgical intervention .  The patient's history has been reviewed, patient examined, no change in status, stable for surgery.  I have reviewed the patient's chart and labs.  Questions were answered to the patient's satisfaction.     Leonard [name]

## 2015-08-07 NOTE — Anesthesia Preprocedure Evaluation (Signed)
Anesthesia Evaluation  Patient identified by MRN, date of birth, ID band Patient awake    Reviewed: Allergy & Precautions, H&P , NPO status , Patient's Chart, lab work & pertinent test results  Airway Mallampati: III  TM Distance: >3 FB Neck ROM: Full    Dental no notable dental hx. (+) Dental Advisory Given, Teeth Intact   Pulmonary sleep apnea and Continuous Positive Airway Pressure Ventilation ,    Pulmonary exam normal breath sounds clear to auscultation       Cardiovascular Exercise Tolerance: Good hypertension, Pt. on medications + CAD (mimimal by Cath)  Normal cardiovascular exam Rhythm:Regular Rate:Normal     Neuro/Psych negative neurological ROS  negative psych ROS   GI/Hepatic Neg liver ROS, hiatal hernia, GERD  Medicated and Controlled,SBO with NG tube in place   Endo/Other  diabetes, Well Controlled, Type 2, Oral Hypoglycemic AgentsMorbid obesity  Renal/GU negative Renal ROS  negative genitourinary   Musculoskeletal negative musculoskeletal ROS (+)   Abdominal   Peds negative pediatric ROS (+)  Hematology negative hematology ROS (+)   Anesthesia Other Findings   Reproductive/Obstetrics negative OB ROS                             Anesthesia Physical Anesthesia Plan  ASA: III and emergent  Anesthesia Plan: General   Post-op Pain Management:    Induction: Intravenous, Rapid sequence and Cricoid pressure planned  Airway Management Planned: Video Laryngoscope Planned and Oral ETT  Additional Equipment:   Intra-op Plan:   Post-operative Plan: Extubation in OR  Informed Consent:   Plan Discussed with: Surgeon  Anesthesia Plan Comments:         Anesthesia Quick Evaluation

## 2015-08-07 NOTE — Progress Notes (Signed)
PACU note---pt's blood pressure elevated; Dr. Landry Dyke notified, order rec'd and med given

## 2015-08-07 NOTE — Progress Notes (Signed)
Subjective: Stable, sump plugged this AM, but no nausea or vomiting , cannister is about 1/2 full.  No pain or tenderness on exam.  Objective: Vital signs in last 24 hours: Temp:  [97.9 F (36.6 C)-99.1 F (37.3 C)] 97.9 F (36.6 C) (09/22 0529) Pulse Rate:  [90-100] 100 (09/22 0529) Resp:  [18-20] 18 (09/22 0529) BP: (129-169)/(76-109) 129/109 mmHg (09/22 0529) SpO2:  [94 %-100 %] 94 % (09/22 0529)  NG not recorded  250 urine recorded Tm 99.1 No labs CT scan yesterday shows abdominal wall hernia with SB involvement Intake/Output from previous day: 09/21 0701 - 09/22 0700 In: -  Out: 250 [Urine:250] Intake/Output this shift:    General appearance: alert, cooperative and no distress GI: soft, nontender, no BS  Lab Results:   Recent Labs  08/05/15 1605 08/06/15 0400  WBC 9.0 9.1  HGB 13.8 13.0  HCT 39.9 38.6*  PLT 309 302    BMET  Recent Labs  08/05/15 1605 08/06/15 0400  NA 137 137  K 3.6 3.8  CL 100* 102  CO2 27 27  GLUCOSE 130* 120*  BUN 18 19  CREATININE 1.16 1.23  CALCIUM 9.3 8.7*   PT/INR  Recent Labs  08/05/15 1605  LABPROT 15.1  INR 1.18     Recent Labs Lab 08/04/15 0445 08/05/15 0915 08/05/15 1605 08/06/15 0400  AST 18 21 20 16   ALT 16* 18 17 16*  ALKPHOS 73 65 67 55  BILITOT 0.6 0.7 0.8 0.7  PROT 8.0 7.6 8.0 6.9  ALBUMIN 4.1 3.8 3.9 3.3*     Lipase     Component Value Date/Time   LIPASE 11* 08/05/2015 0915     Studies/Results: Ct Abdomen Pelvis W Contrast  08/06/2015   ADDENDUM REPORT: 08/06/2015 16:13 ADDENDUM: These results were called by telephone at the time of interpretation on 08/06/2015 at 4:12 pm to Dr. Gurney Maxin, who verbally acknowledged these results. Electronically Signed   By: Markus Daft M.D.   On: 08/06/2015 16:13  08/06/2015   CLINICAL DATA:  Evaluate abdominal wall fluid collections. History of prostate cancer.  EXAM: CT ABDOMEN AND PELVIS WITH CONTRAST  TECHNIQUE: Multidetector CT imaging of the  abdomen and pelvis was performed using the standard protocol following bolus administration of intravenous contrast.  CONTRAST:  14mL OMNIPAQUE IOHEXOL 300 MG/ML  SOLN  COMPARISON:  08/01/2015 and abdominal radiographs 08/05/2015  FINDINGS: Increased volume loss at the medial left lung base with trace pleural fluid. CT tomogram images demonstrate dilated loops of small bowel throughout the abdomen which are similar to the examination on 08/05/2015.  Negative for free intraperitoneal air.  Nasogastric tube extends into the stomach body. Mild dilatation of the distal esophagus.  Again noted are numerous hypodensities throughout the liver which most likely represent cysts, largest measuring 2.2 cm. Normal appearance of the gallbladder and portal venous system is patent. No acute abnormality to the pancreas or spleen. Again noted is a small exophytic hypodensity along the superior aspect of spleen measuring up to 1.6 cm and may contain fat. No acute abnormality involving the kidneys and no evidence for hydronephrosis. Stable left adrenal nodules may represent adenomas based their low density.  Atherosclerotic calcifications in the distal abdominal aorta without aneurysm. No significant abdominal or pelvic lymphadenopathy.  Prior prostatectomy. Small amount of fluid at the pelvis has decreased or resolved. There is oral contrast throughout the colon. The colon is decompressed. There is oral contrast in the appendix. There are dilated loops of small bowel  and the previously described subcutaneous fluid collection in the right lower abdomen represents an abdominal wall hernia containing small bowel loops. Dilated small bowel loops measure up to 3.3 cm. There is concern for mild inflammatory changes in the right lower abdomen surrounding a loop of bowel on sequence 2, image 61. There may be mesenteric edema around this loop of small bowel.  Extensive facet arthropathy at L5-S1. Mild disc space narrowing and vacuum disc  phenomena at L5-S1.  IMPRESSION: Persistent dilated loops of small bowel compatible with a small bowel obstruction. 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 Findings are concerning for an incarcerated hernia causing the small bowel obstruction. Mild inflammation or mesenteric edema involving a small bowel loop in the right lower abdomen.  Volume loss at the left lung base with trace left pleural fluid.  Stable left adrenal nodules may represent adenomas.  Exophytic splenic lesion with fat attenuation. This could represent a small subcapsular hamartoma.  Probable hepatic cysts.  Electronically Signed: By: Markus Daft M.D. On: 08/06/2015 16:08   Dg Abd Acute W/chest  08/05/2015   CLINICAL DATA:  Nausea and vomiting for 2-3 days.  EXAM: DG ABDOMEN ACUTE W/ 1V CHEST  COMPARISON:  April 24, 2014  FINDINGS: There is mild atelectasis of the right lung base. The heart size is mildly enlarged. The aorta is tortuous. There is no pleural effusion. There are multiple dilated small bowel loops in the abdomen. Residual contrast is identified in the colon. There is scoliosis of spine.  IMPRESSION: Multiple dilated small bowel loops. The findings are consistent with early small bowel obstruction or partial small bowel obstruction.  Mild atelectasis of right lung base.   Electronically Signed   By: [name] M.D.   On: 08/05/2015 12:25   Dg Loyce Dys Tube Plc W/fl W/rad  08/06/2015   CLINICAL DATA:  69 year old male with small bowel obstruction in need of nasogastric tube placement.  EXAM: NASO G TUBE PLACEMENT WITH FL AND WITH RAD  CONTRAST:  None  FLUOROSCOPY TIME:  If the device does not provide the exposure index:  Fluoroscopy Time (in minutes and seconds):  5 minutes and 2 seconds  Number of Acquired Images:  1  COMPARISON:  No priors.  FINDINGS: Fluoroscopic guidance was utilized to pass a feeding tube into the stomach over a guidewire. Proper  placement of the tube was confirmed with fluoroscopy. No complications.  IMPRESSION: 1. Successful fluoroscopic guided placement of feeding tube into the body of the stomach.   Electronically Signed   By: Vinnie Langton M.D.   On: 08/06/2015 13:17    Medications: . enoxaparin (LOVENOX) injection  60 mg Subcutaneous Q24H  . hydrALAZINE  5 mg Intravenous Q6H  . insulin aspart  0-9 Units Subcutaneous Q4H  . pantoprazole (PROTONIX) IV  40 mg Intravenous Q12H    Assessment/Plan SBO  Ongoing nausea and vomiting, dehydration Hx of Type II diabetes  Hx of atrophic gastritis/GERD/Hiatal hernia Hx of hyperplastic gastric polyp Richter hernia on CT without obstruction 08/01/15 Hx of gastric polyp Hx of diverticulosis, but no hx of diverticulitis Prostatic cancer with robotic prostatectomy on 04/2014 Hypertension Cardiac Murmur Mild MR OSA on CPAP Body mass index is 41.5 Gout Antibiotics: None DVT: Lovenox/SCD   Plan:  For surgery later today, recheck BMP, & mag.    LOS: 2 days    JENNINGS,WILLARD 08/07/2015

## 2015-08-08 ENCOUNTER — Encounter (HOSPITAL_COMMUNITY): Payer: Self-pay | Admitting: General Surgery

## 2015-08-08 LAB — GLUCOSE, CAPILLARY
GLUCOSE-CAPILLARY: 122 mg/dL — AB (ref 65–99)
GLUCOSE-CAPILLARY: 123 mg/dL — AB (ref 65–99)
GLUCOSE-CAPILLARY: 129 mg/dL — AB (ref 65–99)
GLUCOSE-CAPILLARY: 141 mg/dL — AB (ref 65–99)
GLUCOSE-CAPILLARY: 161 mg/dL — AB (ref 65–99)
Glucose-Capillary: 121 mg/dL — ABNORMAL HIGH (ref 65–99)
Glucose-Capillary: 158 mg/dL — ABNORMAL HIGH (ref 65–99)

## 2015-08-08 MED ORDER — MAGNESIUM SULFATE 2 GM/50ML IV SOLN
2.0000 g | Freq: Once | INTRAVENOUS | Status: AC
  Administered 2015-08-08: 2 g via INTRAVENOUS
  Filled 2015-08-08: qty 50

## 2015-08-08 NOTE — Progress Notes (Addendum)
[facility] PROGRESS NOTE  Leonard [name] HBZ:[id number] DOB: May 21, 1946 DOA: 08/05/2015 PCP: [name], MD   Brief narrative: Patient is a 69 year old Caucasian male with 6 week history of abdominal discomfort. Patient currently being evaluated for SBO. Repeat CT scan for evaluation of abdominal wall fluid collection reported findings suspicious for incarcerated bowel. As such general surgery took patient to OR 08/07/2015  Assessment/Plan: SBO (small bowel obstruction) - General surgery on board and managing - continue supportive therapy - While patient is nothing by mouth continue maintenance IV fluids -doing better and no off NGT -slowly advance diet as per CCs rec's -will keep electrolytes repleted as needed  Abdominal wall fluid collections: due to incisional hernia -s/p laparoscopic incisional repair -doing better overall and with controlled abd pain -will follow response and advance diet    Obstructive sleep apnea - stable continue home cpap    Essential hypertension - Currently on hydralazine PRN -BP is stable -will monitor    CAD (coronary artery disease) - no chest pain reported.    Diabetes mellitus type 2, controlled, without complications -will continue SSI -A1C 6.6    Nausea and vomiting - NG tube has been removed and plan is for trial of CLD sips -will follow CCS rec  Obstructive sleep apnea -will continue CPAP    Gout: Stable  GERD (gastroesophageal reflux disease) - continue PPI  Obesity -Body mass index is 43.13 kg/(m^2). -patient advise to follow low calorie diet and to increase exercise   Code Status: Full Family Communication: No family at bedside  Disposition Plan: Pending improvement in condition; most likely home when medically ready.   Consultants:  General surgery   procedures: Laparoscopic incisional hernia repair with mesh 08/07/15  Antibiotics:  None   HPI/Subjective: Afebrile, denies CP, no SOB. No passing  gas and without BM. No nausea, no vomiting. abd pain controlled.   Objective: Filed Vitals:   08/08/15 2122  BP: 135/67  Pulse: 69  Temp: 98.1 F (36.7 C)  Resp: 18    Intake/Output Summary (Last 24 hours) at 08/08/15 2234 Last data filed at 08/08/15 2207  Gross per 24 hour  Intake   3880 ml  Output    700 ml  Net   3180 ml   Filed Weights   08/05/15 1510 08/08/15 0504  Weight: 128.685 kg (283 lb 11.2 oz) 133.448 kg (294 lb 3.2 oz)    Exam:   General: afebrile, feeling better overall; abd pain controlled, no flatus, no BM  Heart: S1 and S2, no rubs or gallops  Abd: soft, tender to deep palpation, neg BS, no guarding  Ext: no edema, no cyanosis   Data Reviewed: Basic Metabolic Panel:  Recent Labs Lab 08/04/15 0445 08/05/15 0915 08/05/15 1605 08/06/15 0400 08/07/15 0825  NA 137 135 137 137 137  K 3.5 3.8 3.6 3.8 3.3*  CL 101 99* 100* 102 105  CO2 26 22 27 27 26   GLUCOSE 158* 157* 130* 120* 129*  BUN 13 16 18 19 12   CREATININE 0.99 1.03 1.16 1.23 1.07  CALCIUM 9.5 9.2 9.3 8.7* 8.5*  MG  --   --  2.2  --  2.0  PHOS  --   --  2.8  --   --    Liver Function Tests:  Recent Labs Lab 08/04/15 0445 08/05/15 0915 08/05/15 1605 08/06/15 0400  AST 18 21 20 16   ALT 16* 18 17 16*  ALKPHOS 73 65 67 55  BILITOT 0.6 0.7 0.8 0.7  PROT 8.0 7.6 8.0 6.9  ALBUMIN 4.1 3.8 3.9 3.3*    Recent Labs Lab 08/04/15 0445 08/05/15 0915  LIPASE 14* 11*   CBC:  Recent Labs Lab 08/04/15 0445 08/05/15 1040 08/05/15 1605 08/06/15 0400  WBC 13.8* 10.5 9.0 9.1  NEUTROABS 10.9* 7.4 5.0  --   HGB 13.0 14.0 13.8 13.0  HCT 39.3 41.4 39.9 38.6*  MCV 90.6 90.8 90.7 91.9  PLT 288 323 309 302   Cardiac Enzymes:  Recent Labs Lab 08/04/15 0445  TROPONINI <0.03   BNP (last 3 results) No results for input(s): BNP in the last 8760 hours.  ProBNP (last 3 results) No results for input(s): PROBNP in the last 8760 hours.  CBG:  Recent Labs Lab 08/08/15 0347  08/08/15 0813 08/08/15 1140 08/08/15 1658 08/08/15 2034  GLUCAP 129* 158* 161* 141* 123*    Recent Results (from the past 240 hour(s))  MRSA PCR Screening     Status: None   Collection Time: 08/07/15  8:07 AM  Result Value Ref Range Status   MRSA by PCR NEGATIVE NEGATIVE Final    Comment:        The GeneXpert MRSA Assay (FDA approved for NASAL specimens only), is one component of a comprehensive MRSA colonization surveillance program. It is not intended to diagnose MRSA infection nor to guide or monitor treatment for MRSA infections.      Studies: No results found.  Scheduled Meds: . enoxaparin (LOVENOX) injection  60 mg Subcutaneous Q24H  . hydrALAZINE  5 mg Intravenous Q6H  . insulin aspart  0-9 Units Subcutaneous Q4H  . morphine   Intravenous 6 times per day  . pantoprazole (PROTONIX) IV  40 mg Intravenous Q12H   Continuous Infusions: . dextrose 5 % and 0.45 % NaCl with KCl 20 mEq/L 100 mL/hr at 08/08/15 1556  . lactated ringers      Time spent: 30 minutes   Barton Dubois  [facility] Pager [phone] If 7PM-7AM, please contact night-coverage at [email or web address], password [facility] 08/08/2015, 10:34 PM  LOS: 3 days

## 2015-08-08 NOTE — Progress Notes (Signed)
1 Day Post-Op  Subjective: He had trouble sleeping ON, PCA, sites look fine.  No BS or flatus.  NG sump was plugged so I don't know how much is coming from his stomach.  I irrigated it and made sure it is working.  I will try some clamping trails later.    Objective: Vital signs in last 24 hours: Temp:  [97.5 F (36.4 C)-98.8 F (37.1 C)] 98.7 F (37.1 C) (09/23 0504) Pulse Rate:  [71-98] 87 (09/23 0504) Resp:  [14-23] 15 (09/23 0504) BP: (105-183)/(53-94) 105/53 mmHg (09/23 0504) SpO2:  [93 %-100 %] 94 % (09/23 0504) Weight:  [133.448 kg (294 lb 3.2 oz)] 133.448 kg (294 lb 3.2 oz) (09/23 0504)  NPO 250 recorded from the NG Afebrile, VSS No labs Intake/Output from previous day: 09/22 0701 - 09/23 0700 In: 3041.7 [I.V.:3041.7] Out: 1125 [Urine:850; Emesis/NG output:250; Blood:25] Intake/Output this shift:    General appearance: alert, cooperative and no distress GI: large abdomen, no BS, no flatus, sites OK  Lab Results:   Recent Labs  08/05/15 1605 08/06/15 0400  WBC 9.0 9.1  HGB 13.8 13.0  HCT 39.9 38.6*  PLT 309 302    BMET  Recent Labs  08/06/15 0400 08/07/15 0825  NA 137 137  K 3.8 3.3*  CL 102 105  CO2 27 26  GLUCOSE 120* 129*  BUN 19 12  CREATININE 1.23 1.07  CALCIUM 8.7* 8.5*   PT/INR  Recent Labs  08/05/15 1605  LABPROT 15.1  INR 1.18     Recent Labs Lab 08/04/15 0445 08/05/15 0915 08/05/15 1605 08/06/15 0400  AST 18 21 20 16   ALT 16* 18 17 16*  ALKPHOS 73 65 67 55  BILITOT 0.6 0.7 0.8 0.7  PROT 8.0 7.6 8.0 6.9  ALBUMIN 4.1 3.8 3.9 3.3*     Lipase     Component Value Date/Time   LIPASE 11* 08/05/2015 0915     Studies/Results: Ct Abdomen Pelvis W Contrast  08/06/2015   ADDENDUM REPORT: 08/06/2015 16:13 ADDENDUM: These results were called by telephone at the time of interpretation on 08/06/2015 at 4:12 pm to Dr. Gurney Maxin, who verbally acknowledged these results. Electronically Signed   By: Markus Daft M.D.   On:  08/06/2015 16:13  08/06/2015   CLINICAL DATA:  Evaluate abdominal wall fluid collections. History of prostate cancer.  EXAM: CT ABDOMEN AND PELVIS WITH CONTRAST  TECHNIQUE: Multidetector CT imaging of the abdomen and pelvis was performed using the standard protocol following bolus administration of intravenous contrast.  CONTRAST:  116mL OMNIPAQUE IOHEXOL 300 MG/ML  SOLN  COMPARISON:  08/01/2015 and abdominal radiographs 08/05/2015  FINDINGS: Increased volume loss at the medial left lung base with trace pleural fluid. CT tomogram images demonstrate dilated loops of small bowel throughout the abdomen which are similar to the examination on 08/05/2015.  Negative for free intraperitoneal air.  Nasogastric tube extends into the stomach body. Mild dilatation of the distal esophagus.  Again noted are numerous hypodensities throughout the liver which most likely represent cysts, largest measuring 2.2 cm. Normal appearance of the gallbladder and portal venous system is patent. No acute abnormality to the pancreas or spleen. Again noted is a small exophytic hypodensity along the superior aspect of spleen measuring up to 1.6 cm and may contain fat. No acute abnormality involving the kidneys and no evidence for hydronephrosis. Stable left adrenal nodules may represent adenomas based their low density.  Atherosclerotic calcifications in the distal abdominal aorta without aneurysm. No significant  abdominal or pelvic lymphadenopathy.  Prior prostatectomy. Small amount of fluid at the pelvis has decreased or resolved. There is oral contrast throughout the colon. The colon is decompressed. There is oral contrast in the appendix. There are dilated loops of small bowel and the previously described subcutaneous fluid collection in the right lower abdomen represents an abdominal wall hernia containing small bowel loops. Dilated small bowel loops measure up to 3.3 cm. There is concern for mild inflammatory changes in the right lower  abdomen surrounding a loop of bowel on sequence 2, image 61. There may be mesenteric edema around this loop of small bowel.  Extensive facet arthropathy at L5-S1. Mild disc space narrowing and vacuum disc phenomena at L5-S1.  IMPRESSION: Persistent dilated loops of small bowel compatible with a small bowel obstruction. 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 Findings are concerning for an incarcerated hernia causing the small bowel obstruction. Mild inflammation or mesenteric edema involving a small bowel loop in the right lower abdomen.  Volume loss at the left lung base with trace left pleural fluid.  Stable left adrenal nodules may represent adenomas.  Exophytic splenic lesion with fat attenuation. This could represent a small subcapsular hamartoma.  Probable hepatic cysts.  Electronically Signed: By: Markus Daft M.D. On: 08/06/2015 16:08   Dg Loyce Dys Tube Plc W/fl W/rad  08/06/2015   CLINICAL DATA:  69 year old male with small bowel obstruction in need of nasogastric tube placement.  EXAM: NASO G TUBE PLACEMENT WITH FL AND WITH RAD  CONTRAST:  None  FLUOROSCOPY TIME:  If the device does not provide the exposure index:  Fluoroscopy Time (in minutes and seconds):  5 minutes and 2 seconds  Number of Acquired Images:  1  COMPARISON:  No priors.  FINDINGS: Fluoroscopic guidance was utilized to pass a feeding tube into the stomach over a guidewire. Proper placement of the tube was confirmed with fluoroscopy. No complications.  IMPRESSION: 1. Successful fluoroscopic guided placement of feeding tube into the body of the stomach.   Electronically Signed   By: Vinnie Langton M.D.   On: 08/06/2015 13:17    Medications: . enoxaparin (LOVENOX) injection  60 mg Subcutaneous Q24H  . hydrALAZINE  5 mg Intravenous Q6H  . insulin aspart  0-9 Units Subcutaneous Q4H  . morphine   Intravenous 6 times per day  . pantoprazole (PROTONIX) IV  40 mg  Intravenous Q12H    Assessment/Plan Small bowel obstruction, incisional hernia S/p laparoscopic incisional hernia repair with mesh, 08/07/15, Dr. Lurena Joiner Kinsinger Hx of Type II diabetes  Hx of atrophic gastritis/GERD/Hiatal hernia Hx of hyperplastic gastric polyp Richter hernia on CT without obstruction 08/01/15 Hx of gastric polyp Hx of diverticulosis, but no hx of diverticulitis Prostatic cancer with robotic prostatectomy on 04/2014 Hypertension Cardiac Murmur Mild MR OSA on CPAP Body mass index is 41.5 Gout Antibiotics: None DVT: Lovenox/SCD  Plan:  I will get him OOB and try some clamping trials later.  I don't hear any BS this Am. He will restart his Lovenox at 6 PM tonight for DVT.  Recheck labs in AM, K+ low yesterday.       LOS: 3 days    JENNINGS,WILLARD 08/08/2015

## 2015-08-09 DIAGNOSIS — E876 Hypokalemia: Secondary | ICD-10-CM

## 2015-08-09 DIAGNOSIS — M1000 Idiopathic gout, unspecified site: Secondary | ICD-10-CM

## 2015-08-09 LAB — GLUCOSE, CAPILLARY
GLUCOSE-CAPILLARY: 143 mg/dL — AB (ref 65–99)
Glucose-Capillary: 129 mg/dL — ABNORMAL HIGH (ref 65–99)
Glucose-Capillary: 131 mg/dL — ABNORMAL HIGH (ref 65–99)
Glucose-Capillary: 149 mg/dL — ABNORMAL HIGH (ref 65–99)
Glucose-Capillary: 151 mg/dL — ABNORMAL HIGH (ref 65–99)
Glucose-Capillary: 162 mg/dL — ABNORMAL HIGH (ref 65–99)

## 2015-08-09 LAB — BASIC METABOLIC PANEL
Anion gap: 9 (ref 5–15)
BUN: 17 mg/dL (ref 6–20)
CALCIUM: 8.5 mg/dL — AB (ref 8.9–10.3)
CO2: 23 mmol/L (ref 22–32)
CREATININE: 1.96 mg/dL — AB (ref 0.61–1.24)
Chloride: 103 mmol/L (ref 101–111)
GFR calc Af Amer: 39 mL/min — ABNORMAL LOW (ref >60)
GFR, EST NON AFRICAN AMERICAN: 34 mL/min — AB (ref >60)
Glucose, Bld: 143 mg/dL — ABNORMAL HIGH (ref 65–99)
Potassium: 3.8 mmol/L (ref 3.5–5.1)
SODIUM: 135 mmol/L (ref 135–145)

## 2015-08-09 LAB — CBC
HCT: 36.1 % — ABNORMAL LOW (ref 39.0–52.0)
Hemoglobin: 11.7 g/dL — ABNORMAL LOW (ref 13.0–17.0)
MCH: 30.2 pg (ref 26.0–34.0)
MCHC: 32.4 g/dL (ref 30.0–36.0)
MCV: 93.3 fL (ref 78.0–100.0)
PLATELETS: 285 10*3/uL (ref 150–400)
RBC: 3.87 MIL/uL — ABNORMAL LOW (ref 4.22–5.81)
RDW: 15.4 % (ref 11.5–15.5)
WBC: 13.6 10*3/uL — ABNORMAL HIGH (ref 4.0–10.5)

## 2015-08-09 LAB — MAGNESIUM: MAGNESIUM: 2.3 mg/dL (ref 1.7–2.4)

## 2015-08-09 MED ORDER — GI COCKTAIL ~~LOC~~
30.0000 mL | Freq: Three times a day (TID) | ORAL | Status: DC | PRN
  Administered 2015-08-09 (×2): 30 mL via ORAL
  Filled 2015-08-09 (×2): qty 30

## 2015-08-09 NOTE — Progress Notes (Signed)
Patient ID: Leonard [name], male   DOB: 1946/06/04, 69 y.o.   MRN: [id number]  [facility]  POD#: 2  Subjective: Patient in bed, complains of acid reflux.  NG tube has been removed.  No flatus or BM per patient.  Ambulatory.  Objective: Vital signs in last 24 hours: Temp:  [98.1 F (36.7 C)-98.3 F (36.8 C)] 98.3 F (36.8 C) (09/24 0446) Pulse Rate:  [69-88] 86 (09/24 0446) Resp:  [13-18] 18 (09/24 0753) BP: (135-155)/(65-69) 144/69 mmHg (09/24 0446) SpO2:  [94 %-98 %] 94 % (09/24 0841) FiO2 (%):  [28 %] 28 % (09/24 0753)    Intake/Output from previous day: 09/23 0701 - 09/24 0700 In: 3844 [I.V.:3844] Out: 300 [Urine:300] Intake/Output this shift:    Physical Exam: HEENT - sclerae clear, mucous membranes moist Neck - soft Chest - clear bilaterally Cor - RRR Abdomen - soft, obese, quiet; wounds dry and intact Ext - no edema, non-tender Neuro - alert & oriented, no focal deficits  Lab Results:   Recent Labs  08/09/15 0345  WBC 13.6*  HGB 11.7*  HCT 36.1*  PLT 285   BMET  Recent Labs  08/07/15 0825 08/09/15 0345  NA 137 135  K 3.3* 3.8  CL 105 103  CO2 26 23  GLUCOSE 129* 143*  BUN 12 17  CREATININE 1.07 1.96*  CALCIUM 8.5* 8.5*   PT/INR No results for input(s): LABPROT, INR in the last 72 hours. Comprehensive Metabolic Panel:    Component Value Date/Time   NA 135 08/09/2015 0345   NA 137 08/07/2015 0825   NA 140 02/20/2015 1001   NA 141 08/01/2014 0917   K 3.8 08/09/2015 0345   K 3.3* 08/07/2015 0825   K 4.1 02/20/2015 1001   K 3.7 08/01/2014 0917   CL 103 08/09/2015 0345   CL 105 08/07/2015 0825   CL 104 04/17/2013 0857   CL 107 10/02/2012 0953   CO2 23 08/09/2015 0345   CO2 26 08/07/2015 0825   CO2 26 02/20/2015 1001   CO2 24 08/01/2014 0917   BUN 17 08/09/2015 0345   BUN 12 08/07/2015 0825   BUN 15.2 02/20/2015 1001   BUN 13.4 08/01/2014 0917   CREATININE 1.96* 08/09/2015 0345   CREATININE  1.07 08/07/2015 0825   CREATININE 1.1 02/20/2015 1001   CREATININE 1.06 12/19/2014 0926   CREATININE 1.1 08/01/2014 0917   CREATININE 1.08 12/13/2013 0934   GLUCOSE 143* 08/09/2015 0345   GLUCOSE 129* 08/07/2015 0825   GLUCOSE 98 02/20/2015 1001   GLUCOSE 114 08/01/2014 0917   GLUCOSE 105* 04/17/2013 0857   GLUCOSE 97 10/02/2012 0953   CALCIUM 8.5* 08/09/2015 0345   CALCIUM 8.5* 08/07/2015 0825   CALCIUM 9.2 02/20/2015 1001   CALCIUM 9.4 08/01/2014 0917   AST 16 08/06/2015 0400   AST 20 08/05/2015 1605   AST 23 02/20/2015 1001   AST 14 08/01/2014 0917   ALT 16* 08/06/2015 0400   ALT 17 08/05/2015 1605   ALT 28 02/20/2015 1001   ALT 12 08/01/2014 0917   ALKPHOS 55 08/06/2015 0400   ALKPHOS 67 08/05/2015 1605   ALKPHOS 81 02/20/2015 1001   ALKPHOS 83 08/01/2014 0917   BILITOT 0.7 08/06/2015 0400   BILITOT 0.8 08/05/2015 1605   BILITOT 0.55 02/20/2015 1001   BILITOT 0.61 08/01/2014 0917   PROT 6.9 08/06/2015 0400   PROT 8.0 08/05/2015 1605   PROT 7.4 02/20/2015 1001   PROT 7.5 08/01/2014  3354   ALBUMIN 3.3* 08/06/2015 0400   ALBUMIN 3.9 08/05/2015 1605   ALBUMIN 3.7 02/20/2015 1001   ALBUMIN 3.6 08/01/2014 0917    Studies/Results: No results found.  Anti-infectives: Anti-infectives    Start     Dose/Rate Route Frequency Ordered Stop   08/07/15 1302  vancomycin (VANCOCIN) 1,500 mg in sodium chloride 0.9 % 500 mL IVPB     1,500 mg 250 mL/hr over 120 Minutes Intravenous 60 min pre-op 08/07/15 1258 08/07/15 1355      Assessment & Plans: Status post lap lysis of adhesions, incisional hernia repair  Continue NPO, IV hydration  Encouraged OOB, ambulation, IS use  Will follow  Earnstine Regal, MD, [facility] Office: [place] 08/09/2015

## 2015-08-09 NOTE — Progress Notes (Signed)
[facility] PROGRESS NOTE  Leonard [name] VQM:[id number] DOB: 1946-05-26 DOA: 08/05/2015 PCP: [name], MD   Brief narrative: Patient is a 69 year old Caucasian male with 6 week history of abdominal discomfort. Patient currently being evaluated for SBO. Repeat CT scan for evaluation of abdominal wall fluid collection reported findings suspicious for incarcerated bowel. As such general surgery took patient to OR 08/07/2015  Assessment/Plan: SBO (small bowel obstruction) - General surgery on board and managing - continue supportive therapy - While patient is nothing by mouth continue maintenance IV fluids -doing better and no off NGT -slowly advance diet as per CCs rec's -will keep an eye on his electrolytes and replete them as needed  Abdominal wall fluid collections: due to incisional hernia -s/p laparoscopic incisional repair -doing better overall and with controlled abd pain -will follow response and advance diet    Obstructive sleep apnea - stable continue home cpap    Essential hypertension - Currently on hydralazine PRN -BP is stable -will monitor    CAD (coronary artery disease) - no chest pain reported.    Diabetes mellitus type 2, controlled, without complications -will continue SSI -A1C 6.6    Nausea and vomiting - NG tube has been removed and patient doing CLD trial -will follow CCS rec on diet advance process  Obstructive sleep apnea -will continue CPAP    Gout: Stable  GERD (gastroesophageal reflux disease) - continue PPI every 12 hours -Will initiate as needed GI cocktail  Obesity -Body mass index is 43.13 kg/(m^2). -patient advise to follow low calorie diet and to increase exercise   Code Status: Full Family Communication: No family at bedside  Disposition Plan: Pending improvement in condition; most likely home when medically ready.   Consultants:  General surgery   procedures: Laparoscopic incisional hernia repair with mesh  08/07/15  Antibiotics:  None   HPI/Subjective: Afebrile, denies CP, no SOB. Complaining of significant reflux symptoms. No bowel movements, no flatulence.  Objective: Filed Vitals:   08/09/15 1318  BP: 182/89  Pulse: 89  Temp: 98.3 F (36.8 C)  Resp: 20    Intake/Output Summary (Last 24 hours) at 08/09/15 1722 Last data filed at 08/09/15 1345  Gross per 24 hour  Intake   3844 ml  Output    500 ml  Net   3344 ml   Filed Weights   08/05/15 1510 08/08/15 0504  Weight: 128.685 kg (283 lb 11.2 oz) 133.448 kg (294 lb 3.2 oz)    Exam:   General: afebrile, feeling better overall; abd pain controlled, no flatus, no BM; main complain is reflux symptoms. Patient so far tolerating clear liquid trial.   Heart: S1 and S2, no rubs or gallops  Abd: soft, tender to deep palpation, neg BS, no guarding  Ext: no edema, no cyanosis   Data Reviewed: Basic Metabolic Panel:  Recent Labs Lab 08/05/15 0915 08/05/15 1605 08/06/15 0400 08/07/15 0825 08/09/15 0345  NA 135 137 137 137 135  K 3.8 3.6 3.8 3.3* 3.8  CL 99* 100* 102 105 103  CO2 22 27 27 26 23   GLUCOSE 157* 130* 120* 129* 143*  BUN 16 18 19 12 17   CREATININE 1.03 1.16 1.23 1.07 1.96*  CALCIUM 9.2 9.3 8.7* 8.5* 8.5*  MG  --  2.2  --  2.0 2.3  PHOS  --  2.8  --   --   --    Liver Function Tests:  Recent Labs Lab 08/04/15 0445 08/05/15 0915 08/05/15 1605 08/06/15 0400  AST 18 21 20 16   ALT 16* 18 17 16*  ALKPHOS 73 65 67 55  BILITOT 0.6 0.7 0.8 0.7  PROT 8.0 7.6 8.0 6.9  ALBUMIN 4.1 3.8 3.9 3.3*    Recent Labs Lab 08/04/15 0445 08/05/15 0915  LIPASE 14* 11*   CBC:  Recent Labs Lab 08/04/15 0445 08/05/15 1040 08/05/15 1605 08/06/15 0400 08/09/15 0345  WBC 13.8* 10.5 9.0 9.1 13.6*  NEUTROABS 10.9* 7.4 5.0  --   --   HGB 13.0 14.0 13.8 13.0 11.7*  HCT 39.3 41.4 39.9 38.6* 36.1*  MCV 90.6 90.8 90.7 91.9 93.3  PLT 288 323 309 302 285   Cardiac Enzymes:  Recent Labs Lab 08/04/15 0445   TROPONINI <0.03   CBG:  Recent Labs Lab 08/09/15 0011 08/09/15 0445 08/09/15 0736 08/09/15 1128 08/09/15 1628  GLUCAP 129* 162* 149* 143* 131*    Recent Results (from the past 240 hour(s))  MRSA PCR Screening     Status: None   Collection Time: 08/07/15  8:07 AM  Result Value Ref Range Status   MRSA by PCR NEGATIVE NEGATIVE Final    Comment:        The GeneXpert MRSA Assay (FDA approved for NASAL specimens only), is one component of a comprehensive MRSA colonization surveillance program. It is not intended to diagnose MRSA infection nor to guide or monitor treatment for MRSA infections.      Studies: No results found.  Scheduled Meds: . enoxaparin (LOVENOX) injection  60 mg Subcutaneous Q24H  . hydrALAZINE  5 mg Intravenous Q6H  . insulin aspart  0-9 Units Subcutaneous Q4H  . morphine   Intravenous 6 times per day  . pantoprazole (PROTONIX) IV  40 mg Intravenous Q12H   Continuous Infusions: . dextrose 5 % and 0.45 % NaCl with KCl 20 mEq/L 100 mL/hr at 08/09/15 1231    Time spent: 30 minutes   Barton Dubois  [facility] Pager [phone] If 7PM-7AM, please contact night-coverage at [email or web address], password [facility] 08/09/2015, 5:22 PM  LOS: 4 days

## 2015-08-10 ENCOUNTER — Inpatient Hospital Stay (HOSPITAL_COMMUNITY): Payer: No Typology Code available for payment source

## 2015-08-10 DIAGNOSIS — N179 Acute kidney failure, unspecified: Secondary | ICD-10-CM

## 2015-08-10 LAB — GLUCOSE, CAPILLARY
GLUCOSE-CAPILLARY: 107 mg/dL — AB (ref 65–99)
GLUCOSE-CAPILLARY: 129 mg/dL — AB (ref 65–99)
GLUCOSE-CAPILLARY: 138 mg/dL — AB (ref 65–99)
GLUCOSE-CAPILLARY: 156 mg/dL — AB (ref 65–99)
Glucose-Capillary: 119 mg/dL — ABNORMAL HIGH (ref 65–99)
Glucose-Capillary: 130 mg/dL — ABNORMAL HIGH (ref 65–99)
Glucose-Capillary: 94 mg/dL (ref 65–99)

## 2015-08-10 MED ORDER — PHENOL 1.4 % MT LIQD: 2.0000 | OROMUCOSAL | Status: DC | PRN

## 2015-08-10 MED ORDER — LACTATED RINGERS IV BOLUS (SEPSIS)
1000.0000 mL | Freq: Once | INTRAVENOUS | Status: AC
  Administered 2015-08-10: 1000 mL via INTRAVENOUS

## 2015-08-10 MED ORDER — METOCLOPRAMIDE HCL 5 MG/ML IJ SOLN
10.0000 mg | Freq: Four times a day (QID) | INTRAMUSCULAR | Status: DC
  Administered 2015-08-10 – 2015-08-13 (×11): 10 mg via INTRAVENOUS
  Filled 2015-08-10 (×11): qty 2

## 2015-08-10 MED ORDER — LIP MEDEX EX OINT
1.0000 "application " | TOPICAL_OINTMENT | Freq: Two times a day (BID) | CUTANEOUS | Status: DC
  Administered 2015-08-10 – 2015-08-14 (×8): 1 via TOPICAL
  Filled 2015-08-10 (×3): qty 7

## 2015-08-10 MED ORDER — PROMETHAZINE HCL 25 MG/ML IJ SOLN
6.2500 mg | INTRAMUSCULAR | Status: DC | PRN
  Administered 2015-08-12 – 2015-08-13 (×5): 12.5 mg via INTRAVENOUS
  Filled 2015-08-10 (×5): qty 1

## 2015-08-10 MED ORDER — DIPHENHYDRAMINE HCL 50 MG/ML IJ SOLN: 12.5000 mg | Freq: Four times a day (QID) | INTRAMUSCULAR | Status: DC | PRN

## 2015-08-10 MED ORDER — ALUM & MAG HYDROXIDE-SIMETH 200-200-20 MG/5ML PO SUSP: 30.0000 mL | Freq: Four times a day (QID) | ORAL | Status: DC | PRN

## 2015-08-10 MED ORDER — LACTATED RINGERS IV SOLN
INTRAVENOUS | Status: DC
  Administered 2015-08-10 – 2015-08-13 (×7): via INTRAVENOUS

## 2015-08-10 MED ORDER — SODIUM CHLORIDE 0.9 % IV SOLN
25.0000 mg | Freq: Four times a day (QID) | INTRAVENOUS | Status: DC | PRN
  Filled 2015-08-10: qty 1

## 2015-08-10 MED ORDER — BISACODYL 10 MG RE SUPP
10.0000 mg | Freq: Every day | RECTAL | Status: DC
  Administered 2015-08-10 – 2015-08-12 (×3): 10 mg via RECTAL
  Filled 2015-08-10 (×3): qty 1

## 2015-08-10 MED ORDER — SODIUM CHLORIDE 0.9 % IV SOLN
8.0000 mg | Freq: Four times a day (QID) | INTRAVENOUS | Status: DC | PRN
  Filled 2015-08-10: qty 4

## 2015-08-10 MED ORDER — ONDANSETRON HCL 4 MG/2ML IJ SOLN
4.0000 mg | Freq: Four times a day (QID) | INTRAMUSCULAR | Status: DC | PRN
  Administered 2015-08-11 – 2015-08-13 (×5): 4 mg via INTRAVENOUS
  Filled 2015-08-10 (×5): qty 2

## 2015-08-10 MED ORDER — MAGIC MOUTHWASH
15.0000 mL | Freq: Four times a day (QID) | ORAL | Status: DC | PRN
  Filled 2015-08-10: qty 15

## 2015-08-10 MED ORDER — MENTHOL 3 MG MT LOZG
1.0000 | LOZENGE | OROMUCOSAL | Status: DC | PRN
Start: 2015-08-10 — End: 2015-08-15

## 2015-08-10 MED ORDER — LACTATED RINGERS IV BOLUS (SEPSIS): 1000.0000 mL | Freq: Three times a day (TID) | INTRAVENOUS | Status: AC | PRN

## 2015-08-10 MED ORDER — METOCLOPRAMIDE HCL 5 MG/ML IJ SOLN: 5.0000 mg | Freq: Four times a day (QID) | INTRAMUSCULAR | Status: DC | PRN

## 2015-08-10 MED ORDER — ONDANSETRON 4 MG PO TBDP
4.0000 mg | ORAL_TABLET | Freq: Four times a day (QID) | ORAL | Status: DC | PRN
  Administered 2015-08-14 (×2): 8 mg via ORAL
  Filled 2015-08-10 (×2): qty 2

## 2015-08-10 NOTE — Progress Notes (Signed)
Leonard [name]  January 11, 1946 [id number]  Patient Care Team: [name], MD as PCP - General (Internal Medicine) Fay Records, MD (Cardiology) Lowella Bandy, MD (Urology)  Called by RN  Pt refusing more attempts for NGT placement by RN or MD - requests only radiology / fluoro to place since that was the only way it worked 4 days ago.  Radiology refusing to do this today, Sunday, despite efforts by floor nursing, Dione RN, and calls to Dr Lovey Newcomer (radiology) by Dr Dyann Kief, with the primary service, and myself.  Will continue to make the patient NPO w ice chips only.  Encourage walking more as the pt is doing w nursing help.  Add Reglan.  See if radiology will comply with orders tomorrow, Monday.     CLINICAL DATA: 69 year old male with small bowel obstruction in need of nasogastric tube placement.  EXAM: NASO G TUBE PLACEMENT WITH FL AND WITH RAD  CONTRAST: None  FLUOROSCOPY TIME: If the device does not provide the exposure index:  Fluoroscopy Time (in minutes and seconds): 5 minutes and 2 seconds  Number of Acquired Images: 1  COMPARISON: No priors.  FINDINGS: Fluoroscopic guidance was utilized to pass a feeding tube into the stomach over a guidewire. Proper placement of the tube was confirmed with fluoroscopy. No complications.  IMPRESSION: 1. Successful fluoroscopic guided placement of feeding tube into the body of the stomach.   Electronically Signed  By: Vinnie Langton M.D.  On: 08/06/2015 13:17    Patient Active Problem List   Diagnosis Date Noted  . Severe obesity (BMI >= 40) 08/08/2015  . SBO (small bowel obstruction) 08/05/2015  . Diabetes mellitus type 2, controlled, without complications 33/35/4562  . Abdominal wall fluid collections 08/05/2015  . Nausea and vomiting 08/05/2015  . Gout 08/05/2015  . GERD (gastroesophageal reflux disease) 08/05/2015  . History of prostate cancer 07/28/2014  . CAD (coronary artery disease)  08/13/2011  . Obstructive sleep apnea 12/15/2007  . Dyslipidemia 11/22/2007  . Essential hypertension 11/22/2007    Past Medical History  Diagnosis Date  . Type II or unspecified type diabetes mellitus without mention of complication, not stated as uncontrolled   . Gouty arthropathy   . Other and unspecified hyperlipidemia   . Unspecified essential hypertension   . OSA (obstructive sleep apnea)     USES CPAP MACHINE   . Diverticulosis of colon (without mention of hemorrhage) 09/05/2006    Colonoscopy   . Hx of colonic polyps 09/05/2006    Colonoscopy(ADENOMATOUS POLYP)  . Hiatal hernia 09/05/2006    EGD  . 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 GERD (gastroesophageal reflux disease) 07/26/2001    EGD(Chronic)  . Hemorrhoids   . Coronary artery disease   . Prostate cancer 2015  . Iron deficiency anemia, unspecified   . Gastric polyp 2014    done at [facility]    Past Surgical History  Procedure Laterality Date  . 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 Hemorrhoid surgery    . Robot assisted laparoscopic radical prostatectomy N/A 05/01/2014    Procedure: ROBOTIC ASSISTED LAPAROSCOPIC RADICAL PROSTATECTOMY WITH INDOCYANINE GREEN DYE;  Surgeon: Alexis Frock, MD;  Location: [facility];  Service: Urology;  Laterality: N/A;  . Lymphadenectomy Bilateral 05/01/2014    Procedure: LYMPHADENECTOMY;  Surgeon: Alexis Frock, MD;  Location: [facility];  Service: Urology;  Laterality: Bilateral;  . Esophagogastroduodenoscopy  Done at [facility]  . Gi prostate biopsy    . Incisional hernia repair N/A 08/07/2015    Procedure: LAPAROSCOPIC INCISIONAL HERNIA repair with mesh;  Surgeon: Arta Bruce Kinsinger, MD;  Location: [facility];  Service: General;  Laterality: N/A;    Social History   Social History  . Marital Status: Married    Spouse Name: N/A  . Number of Children: 3  . Years of Education: N/A   Occupational History  . Tour manager     Social History Main Topics  . Smoking status: Never Smoker   . Smokeless tobacco: Never Used  . Alcohol Use: No  . Drug Use: No  . Sexual Activity: Not on file   Other Topics Concern  . Not on file   Social History Narrative    Family History  Problem Relation Age of Onset  . Heart attack Father   . Diabetes Mother   . Diabetes Brother     Current Facility-Administered Medications  Medication Dose Route Frequency Provider Last Rate Last Dose  . acetaminophen (TYLENOL) tablet 650 mg  650 mg Oral Q6H PRN Robbie Lis, MD       Or  . acetaminophen (TYLENOL) suppository 650 mg  650 mg Rectal Q6H PRN Robbie Lis, MD      . dextrose 5 % and 0.45 % NaCl with KCl 20 mEq/L infusion   Intravenous Continuous Barton Dubois, MD 75 mL/hr at 08/10/15 [phone]    . diphenhydrAMINE (BENADRYL) injection 12.5 mg  12.5 mg Intravenous Q6H PRN Mickeal Skinner, MD       Or  . diphenhydrAMINE (BENADRYL) 12.5 MG/5ML elixir 12.5 mg  12.5 mg Oral Q6H PRN Arta Bruce Kinsinger, MD      . enoxaparin (LOVENOX) injection 60 mg  60 mg Subcutaneous Q24H Earnstine Regal, PA-C   60 mg at 08/09/15 1746  . gi cocktail (Maalox,Lidocaine,Donnatal)  30 mL Oral TID PRN Barton Dubois, MD   30 mL at 08/09/15 2150  . hydrALAZINE (APRESOLINE) injection 5 mg  5 mg Intravenous Q6H Robbie Lis, MD   5 mg at 08/10/15 1529  . insulin aspart (novoLOG) injection 0-9 Units  0-9 Units Subcutaneous Q4H Velvet Bathe, MD   2 Units at 08/10/15 1217  . ketorolac (TORADOL) 30 MG/ML injection 30 mg  30 mg Intravenous Q6H PRN Arta Bruce Kinsinger, MD      . metoCLOPramide (REGLAN) injection 10 mg  10 mg Intravenous 4 times per day Michael Boston, MD   10 mg at 08/10/15 1529  . morphine 1 MG/ML PCA injection   Intravenous 6 times per day Mickeal Skinner, MD      . naloxone [facility]) injection 0.4 mg  0.4 mg Intravenous PRN Arta Bruce Kinsinger, MD       And  . sodium chloride 0.9 % injection 9 mL  9 mL Intravenous PRN Arta Bruce Kinsinger, MD      . ondansetron [facility]) tablet 4 mg  4 mg Oral Q6H PRN Robbie Lis, MD       Or  . ondansetron [facility]) injection 4 mg  4 mg Intravenous Q6H PRN Robbie Lis, MD   4 mg at 08/10/15 1032  . pantoprazole (PROTONIX) injection 40 mg  40 mg Intravenous Q12H Earnstine Regal, PA-C   40 mg at 08/10/15 0756     Allergies  Allergen Reactions  . Dicloxacillin Rash  . Cayenne Pepper [Cayenne] Swelling    BP 155/81 mmHg  Pulse  98  Temp(Src) 98.3 F (36.8 C) (Oral)  Resp 20  Ht 5' 9.25" (1.759 m)  Wt 136.986 kg (302 lb)  BMI 44.27 kg/m2  SpO2 95%  Ct Abdomen Pelvis W Contrast  08/06/2015   ADDENDUM REPORT: 08/06/2015 16:13 ADDENDUM: These results were called by telephone at the time of interpretation on 08/06/2015 at 4:12 pm to Dr. Gurney Maxin, who verbally acknowledged these results. Electronically Signed   By: Markus Daft M.D.   On: 08/06/2015 16:13  08/06/2015   CLINICAL DATA:  Evaluate abdominal wall fluid collections. History of prostate cancer.  EXAM: CT ABDOMEN AND PELVIS WITH CONTRAST  TECHNIQUE: Multidetector CT imaging of the abdomen and pelvis was performed using the standard protocol following bolus administration of intravenous contrast.  CONTRAST:  125mL OMNIPAQUE IOHEXOL 300 MG/ML  SOLN  COMPARISON:  08/01/2015 and abdominal radiographs 08/05/2015  FINDINGS: Increased volume loss at the medial left lung base with trace pleural fluid. CT tomogram images demonstrate dilated loops of small bowel throughout the abdomen which are similar to the examination on 08/05/2015.  Negative for free intraperitoneal air.  Nasogastric tube extends into the stomach body. Mild dilatation of the distal esophagus.  Again noted are numerous hypodensities throughout the liver which most likely represent cysts, largest measuring 2.2 cm. Normal appearance of the gallbladder and portal venous system is patent. No acute abnormality to the pancreas or spleen. Again noted is a small  exophytic hypodensity along the superior aspect of spleen measuring up to 1.6 cm and may contain fat. No acute abnormality involving the kidneys and no evidence for hydronephrosis. Stable left adrenal nodules may represent adenomas based their low density.  Atherosclerotic calcifications in the distal abdominal aorta without aneurysm. No significant abdominal or pelvic lymphadenopathy.  Prior prostatectomy. Small amount of fluid at the pelvis has decreased or resolved. There is oral contrast throughout the colon. The colon is decompressed. There is oral contrast in the appendix. There are dilated loops of small bowel and the previously described subcutaneous fluid collection in the right lower abdomen represents an abdominal wall hernia containing small bowel loops. Dilated small bowel loops measure up to 3.3 cm. There is concern for mild inflammatory changes in the right lower abdomen surrounding a loop of bowel on sequence 2, image 61. There may be mesenteric edema around this loop of small bowel.  Extensive facet arthropathy at L5-S1. Mild disc space narrowing and vacuum disc phenomena at L5-S1.  IMPRESSION: Persistent dilated loops of small bowel compatible with a small bowel obstruction. 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 Findings are concerning for an incarcerated hernia causing the small bowel obstruction. Mild inflammation or mesenteric edema involving a small bowel loop in the right lower abdomen.  Volume loss at the left lung base with trace left pleural fluid.  Stable left adrenal nodules may represent adenomas.  Exophytic splenic lesion with fat attenuation. This could represent a small subcapsular hamartoma.  Probable hepatic cysts.  Electronically Signed: By: Markus Daft M.D. On: 08/06/2015 16:08   Ct Abdomen Pelvis W Contrast  08/01/2015   CLINICAL DATA:  Epigastric and abdominal pain intermittently for 1.5 months,  episodes last few hours, type II diabetes mellitus, essential hypertension, coronary artery disease, prostate cancer post prostatectomy, hiatal hernia  EXAM: CT ABDOMEN AND PELVIS WITH CONTRAST  TECHNIQUE: Multidetector CT imaging of the abdomen and pelvis was performed using the standard protocol following bolus administration of intravenous contrast. Sagittal  and coronal MPR images reconstructed from axial data set.  CONTRAST:  145mL OMNIPAQUE IOHEXOL 300 MG/ML SOLN IV. Dilute oral contrast.  COMPARISON:  02/22/2014 CT pelvis  FINDINGS: Minimal dependent atelectasis in the lower lobes.  Multiple low-attenuation foci within liver up to 2.2 cm diameter questions cysts.  Remainder of liver, spleen, pancreas, gallbladder, kidneys, and RIGHT adrenal gland normal.  Low-attenuation LEFT adrenal nodules consistent with adenomas, 1.6 x 1.4 cm image 31 and 20 x 11 mm image 28.  Normal appendix and gallbladder.  Small subcutaneous collection identified external to the fascia in RIGHT mid abdomen the level of the iliac crests, 4.5 x 1.5 x 3.6 cm in size, uncertain etiology.  No definite fashion old defect to suggest hernia seen.  This could be related to infection, prior surgery or trauma and could be a sterile or infected subcutaneous collection.  Interval prostatectomy with small fluid collection adjacent to rectum 2 cm diameter question perirectal fluid versus within a peritoneal recess.  Low position of the decompressed urinary bladder.  Questionable area of tumor nodularity versus postsurgical change anterolateral to the small perirectal fluid collection, 17 x 10 x 18 mm.  Gastric wall appears slightly prominent though stomach is underdistended.  Large and small bowel loops unremarkable.  Minimal scattered atherosclerotic calcification.  No additional mass, adenopathy, free air or free fluid.  No acute osseous findings.  IMPRESSION: Interval prostatectomy with low position of urinary bladder.  Small perirectal fluid  collection 2 cm diameter versus a small amount of pelvic ascites.  17 x 10 x 18 mm area of density posterior to the urinary bladder, RIGHT anterolateral to the perirectal fluid collection, question postsurgical change versus tumor nodularity.  Multiple small low-attenuation hepatic lesions likely representing numerous cysts.  LEFT adrenal adenomas.  Nonspecific subcutaneous fluid collection RIGHT mid abdomen 4.5 x 1.5 x 3.6 cm, question postsurgical, related to prior trauma or prior infection.   Electronically Signed   By: Lavonia Dana M.D.   On: 08/01/2015 11:43   Dg Abd Acute W/chest  08/05/2015   CLINICAL DATA:  Nausea and vomiting for 2-3 days.  EXAM: DG ABDOMEN ACUTE W/ 1V CHEST  COMPARISON:  April 24, 2014  FINDINGS: There is mild atelectasis of the right lung base. The heart size is mildly enlarged. The aorta is tortuous. There is no pleural effusion. There are multiple dilated small bowel loops in the abdomen. Residual contrast is identified in the colon. There is scoliosis of spine.  IMPRESSION: Multiple dilated small bowel loops. The findings are consistent with early small bowel obstruction or partial small bowel obstruction.  Mild atelectasis of right lung base.   Electronically Signed   By: [name] M.D.   On: 08/05/2015 12:25   Dg Loyce Dys Tube Plc W/fl W/rad  08/06/2015   CLINICAL DATA:  69 year old male with small bowel obstruction in need of nasogastric tube placement.  EXAM: NASO G TUBE PLACEMENT WITH FL AND WITH RAD  CONTRAST:  None  FLUOROSCOPY TIME:  If the device does not provide the exposure index:  Fluoroscopy Time (in minutes and seconds):  5 minutes and 2 seconds  Number of Acquired Images:  1  COMPARISON:  No priors.  FINDINGS: Fluoroscopic guidance was utilized to pass a feeding tube into the stomach over a guidewire. Proper placement of the tube was confirmed with fluoroscopy. No complications.  IMPRESSION: 1. Successful fluoroscopic guided placement of feeding tube into the body  of the stomach.   Electronically Signed  By: Vinnie Langton M.D.   On: 08/06/2015 13:17    Note: This dictation was prepared with Dragon/digital dictation along with [employer]. Any transcriptional errors that result from this process are unintentional.

## 2015-08-10 NOTE — Progress Notes (Signed)
RN received notification that radiology not able to place NG tube today due to no radiologist present on site, tube will be placed in AM.  RN reached out to radiology staff regarding this pt to see if there is any possibility of placing this tube today and was informed that there was not.  RN reached out to internal medicine MD who informed RN that he had spoken with radiologist on call regarding this and that it would be done tomorrow.  Rn then reached out to surgery MD, per on call surgeon, start reglan IV and have another RN from surgical floor or ICU attempt placement.  Surgeon off site currently but may be able to stop by and attempt to place tube himself if patient agreeable.  RN spoke to pt and explained that radiology available tomorrow to place tube, however RN explained that we can call another RN from surgical or ICU floor to attempt and if not that then there is a possibility that surgeon may stop by and attempt.  Patient stated he does not want anyone else to "try" but would rather wait till am for radiology. Rn explained that given that he is not passing gas, there is concern that he will begin vomiting especially since he vomited a very large amount last night and despite nausea medications this is a mechanical issue and vomiting will more than likely still occur. Patient stated he understood and if he vomits then he vomits but he doesn't want anyone else trying anything when he radiology can place the tube tomorrow. RN provided pt with nausea medication and will continue to monitor.

## 2015-08-10 NOTE — Progress Notes (Signed)
Rn attempted to place Ng tube at bedside with no success.  Unable to advance tube past nasal cavity.  Per pt last NG tube placed by radiology using guide wire.  Prior to that pt has three failed attempts at bedside.  MD notified and to place order for radiology to place NG tube.

## 2015-08-10 NOTE — Progress Notes (Signed)
PHARMACY NOTE -  Lovenox  Pharmacy has been assisting with dosing of Lovenox for VTE prophylaxis. Dosage remains stable at 60mg  SQ q24h and need for further dosage adjustment appears unlikely at present as CrCl remains > 30 ml/min. Will sign off at this time.  Please reconsult if a change in clinical status warrants re-evaluation of dosage.  Peggyann Juba, PharmD, BCPS Pager: [phone] 08/10/2015 4:02 PM

## 2015-08-10 NOTE — Progress Notes (Signed)
Patient continues to c/o acid reflux.  He ambulated around unit last night with stand by assist. No report of flatulence at this time. At approximately 2340 patient requested to sit in the recliner because he was not "feeling well". At approximately 2343, he vomited 900cc of dark green liquid. Zofran 4 mg IV was given at 2345. Dr Harlow Asa was informed at [facility] and received orders for NPO except ice chips and to increase IV fluid rate to 150cc/hr. Patient to be seen on rounds in the morning by MD. No further vomiting noted at this time but will continue to closely monitor. Son is at bedside.

## 2015-08-10 NOTE — Progress Notes (Signed)
[facility] PROGRESS NOTE  Leonard [name] VOJ:[id number] DOB: July 03, 1946 DOA: 08/05/2015 PCP: [name], MD   Brief narrative: Patient is a 69 year old Caucasian male with 6 week history of abdominal discomfort. Patient currently being evaluated for SBO. Repeat CT scan for evaluation of abdominal wall fluid collection reported findings suspicious for incarcerated bowel. As such general surgery took patient to OR 08/07/2015  Assessment/Plan: SBO (small bowel obstruction) - General surgery on board and managing - continue supportive therapy - While patient is nothing by mouth continue maintenance IV fluids -doing better and no off NGT; but if symptoms worsen will need it back -given overnight episodes of N/V and abd discomfort, patient back to NPO except for ice chips. Will y advance diet as per CCs rec's -will keep an eye on his electrolytes and replete them as needed  Abdominal wall fluid collections: due to incisional hernia -s/p laparoscopic incisional repair -doing better overall and with controlled abd pain -will follow response and advance diet    Obstructive sleep apnea - stable continue home cpap    Essential hypertension - Currently on hydralazine PRN -BP is stable -will monitor    CAD (coronary artery disease) - no chest pain reported.    Diabetes mellitus type 2, controlled, without complications -will continue SSI -A1C 6.6    Nausea and vomiting - NG tube has been removed and patient doing CLD trial -will follow CCS rec on diet advance process  Obstructive sleep apnea -will continue CPAP    Gout: Stable  GERD (gastroesophageal reflux disease) - continue PPI every 12 hours -GI cocktail PRN when able to tolerate PO   Obesity -Body mass index is 44.27 kg/(m^2). -patient advise to follow low calorie diet and to increase exercise   Code Status: Full Family Communication: No family at bedside  Disposition Plan: Pending improvement in condition;  most likely home when medically ready.   Consultants:  General surgery   procedures: Laparoscopic incisional hernia repair with mesh 08/07/15  Antibiotics:  None   HPI/Subjective: Afebrile, denies CP, no SOB. Complaining of significant reflux symptoms. No bowel movements, no flatulence.  Objective: Filed Vitals:   08/10/15 0800  BP:   Pulse:   Temp:   Resp: 22    Intake/Output Summary (Last 24 hours) at 08/10/15 0925 Last data filed at 08/10/15 0527  Gross per 24 hour  Intake 4204.8 ml  Output    500 ml  Net 3704.8 ml   Filed Weights   08/05/15 1510 08/08/15 0504 08/10/15 0646  Weight: 128.685 kg (283 lb 11.2 oz) 133.448 kg (294 lb 3.2 oz) 136.986 kg (302 lb)    Exam:   General: afebrile, feeling ok now. Overnight with nausea and vomiting (approx 700-900 CC). no passing gas or flatus. Reports mildbetter overall; abd pain controlled, no flatus, no BM   Heart: S1 and S2, no rubs or gallops  Abd: soft, tender to deep palpation, neg BS, no guarding  Ext: no edema, no cyanosis   Data Reviewed: Basic Metabolic Panel:  Recent Labs Lab 08/05/15 0915 08/05/15 1605 08/06/15 0400 08/07/15 0825 08/09/15 0345  NA 135 137 137 137 135  K 3.8 3.6 3.8 3.3* 3.8  CL 99* 100* 102 105 103  CO2 22 27 27 26 23   GLUCOSE 157* 130* 120* 129* 143*  BUN 16 18 19 12 17   CREATININE 1.03 1.16 1.23 1.07 1.96*  CALCIUM 9.2 9.3 8.7* 8.5* 8.5*  MG  --  2.2  --  2.0 2.3  PHOS  --  2.8  --   --   --    Liver Function Tests:  Recent Labs Lab 08/04/15 0445 08/05/15 0915 08/05/15 1605 08/06/15 0400  AST 18 21 20 16   ALT 16* 18 17 16*  ALKPHOS 73 65 67 55  BILITOT 0.6 0.7 0.8 0.7  PROT 8.0 7.6 8.0 6.9  ALBUMIN 4.1 3.8 3.9 3.3*    Recent Labs Lab 08/04/15 0445 08/05/15 0915  LIPASE 14* 11*   CBC:  Recent Labs Lab 08/04/15 0445 08/05/15 1040 08/05/15 1605 08/06/15 0400 08/09/15 0345  WBC 13.8* 10.5 9.0 9.1 13.6*  NEUTROABS 10.9* 7.4 5.0  --   --   HGB 13.0  14.0 13.8 13.0 11.7*  HCT 39.3 41.4 39.9 38.6* 36.1*  MCV 90.6 90.8 90.7 91.9 93.3  PLT 288 323 309 302 285   Cardiac Enzymes:  Recent Labs Lab 08/04/15 0445  TROPONINI <0.03   CBG:  Recent Labs Lab 08/09/15 1628 08/09/15 1958 08/10/15 08/10/15 0403 08/10/15 0742  GLUCAP 131* 151* 129* 156* 138*    Recent Results (from the past 240 hour(s))  MRSA PCR Screening     Status: None   Collection Time: 08/07/15  8:07 AM  Result Value Ref Range Status   MRSA by PCR NEGATIVE NEGATIVE Final    Comment:        The GeneXpert MRSA Assay (FDA approved for NASAL specimens only), is one component of a comprehensive MRSA colonization surveillance program. It is not intended to diagnose MRSA infection nor to guide or monitor treatment for MRSA infections.      Studies: No results found.  Scheduled Meds: . enoxaparin (LOVENOX) injection  60 mg Subcutaneous Q24H  . hydrALAZINE  5 mg Intravenous Q6H  . insulin aspart  0-9 Units Subcutaneous Q4H  . morphine   Intravenous 6 times per day  . pantoprazole (PROTONIX) IV  40 mg Intravenous Q12H   Continuous Infusions: . dextrose 5 % and 0.45 % NaCl with KCl 20 mEq/L 150 mL/hr at 08/10/15 0525    Time spent: 30 minutes   Barton Dubois  [facility] Pager [phone] If 7PM-7AM, please contact night-coverage at [email or web address], password [facility] 08/10/2015, 9:25 AM  LOS: 5 days             ``````````````````````````````````````````````````````````````````````````````````````````````````````````````````````````````````````````````````````````````````````````````````````````````````````````````

## 2015-08-10 NOTE — Progress Notes (Signed)
Patient ID: JAICOB DIA, male   DOB: 04-01-1946, 69 y.o.   MRN: [id number]  [facility]  POD#: 3  Subjective: Patient with nausea, emesis last night.  Denies nausea this AM.  No BM or flatus yet.  Son at bedside.  Objective: Vital signs in last 24 hours: Temp:  [97.9 F (36.6 C)-98.3 F (36.8 C)] 98.3 F (36.8 C) (09/25 0646) Pulse Rate:  [89-103] 98 (09/25 0646) Resp:  [17-26] 22 (09/25 0646) BP: (155-182)/(81-89) 155/81 mmHg (09/25 0646) SpO2:  [93 %-99 %] 97 % (09/25 0646) FiO2 (%):  [21 %-28 %] 21 % (09/24 1600) Weight:  [136.986 kg (302 lb)] 136.986 kg (302 lb) (09/25 0646)    Intake/Output from previous day: 09/24 0701 - 09/25 0700 In: 4204.8 [I.V.:4204.8] Out: 500 [Urine:500] Intake/Output this shift:    Physical Exam: HEENT - sclerae clear, mucous membranes moist Neck - soft Chest - clear bilaterally Cor - RRR Abdomen - soft, obese; quiet; wounds dry and intact Ext - no edema, non-tender Neuro - alert & oriented, no focal deficits  Lab Results:   Recent Labs  08/09/15 0345  WBC 13.6*  HGB 11.7*  HCT 36.1*  PLT 285   BMET  Recent Labs  08/07/15 0825 08/09/15 0345  NA 137 135  K 3.3* 3.8  CL 105 103  CO2 26 23  GLUCOSE 129* 143*  BUN 12 17  CREATININE 1.07 1.96*  CALCIUM 8.5* 8.5*   PT/INR No results for input(s): LABPROT, INR in the last 72 hours. Comprehensive Metabolic Panel:    Component Value Date/Time   NA 135 08/09/2015 0345   NA 137 08/07/2015 0825   NA 140 02/20/2015 1001   NA 141 08/01/2014 0917   K 3.8 08/09/2015 0345   K 3.3* 08/07/2015 0825   K 4.1 02/20/2015 1001   K 3.7 08/01/2014 0917   CL 103 08/09/2015 0345   CL 105 08/07/2015 0825   CL 104 04/17/2013 0857   CL 107 10/02/2012 0953   CO2 23 08/09/2015 0345   CO2 26 08/07/2015 0825   CO2 26 02/20/2015 1001   CO2 24 08/01/2014 0917   BUN 17 08/09/2015 0345   BUN 12 08/07/2015 0825   BUN 15.2 02/20/2015 1001   BUN 13.4  08/01/2014 0917   CREATININE 1.96* 08/09/2015 0345   CREATININE 1.07 08/07/2015 0825   CREATININE 1.1 02/20/2015 1001   CREATININE 1.06 12/19/2014 0926   CREATININE 1.1 08/01/2014 0917   CREATININE 1.08 12/13/2013 0934   GLUCOSE 143* 08/09/2015 0345   GLUCOSE 129* 08/07/2015 0825   GLUCOSE 98 02/20/2015 1001   GLUCOSE 114 08/01/2014 0917   GLUCOSE 105* 04/17/2013 0857   GLUCOSE 97 10/02/2012 0953   CALCIUM 8.5* 08/09/2015 0345   CALCIUM 8.5* 08/07/2015 0825   CALCIUM 9.2 02/20/2015 1001   CALCIUM 9.4 08/01/2014 0917   AST 16 08/06/2015 0400   AST 20 08/05/2015 1605   AST 23 02/20/2015 1001   AST 14 08/01/2014 0917   ALT 16* 08/06/2015 0400   ALT 17 08/05/2015 1605   ALT 28 02/20/2015 1001   ALT 12 08/01/2014 0917   ALKPHOS 55 08/06/2015 0400   ALKPHOS 67 08/05/2015 1605   ALKPHOS 81 02/20/2015 1001   ALKPHOS 83 08/01/2014 0917   BILITOT 0.7 08/06/2015 0400   BILITOT 0.8 08/05/2015 1605   BILITOT 0.55 02/20/2015 1001   BILITOT 0.61 08/01/2014 0917   PROT 6.9 08/06/2015 0400   PROT 8.0 08/05/2015  1605   PROT 7.4 02/20/2015 1001   PROT 7.5 08/01/2014 0917   ALBUMIN 3.3* 08/06/2015 0400   ALBUMIN 3.9 08/05/2015 1605   ALBUMIN 3.7 02/20/2015 1001   ALBUMIN 3.6 08/01/2014 0917    Studies/Results: No results found.  Anti-infectives: Anti-infectives    Start     Dose/Rate Route Frequency Ordered Stop   08/07/15 1302  vancomycin (VANCOCIN) 1,500 mg in sodium chloride 0.9 % 500 mL IVPB     1,500 mg 250 mL/hr over 120 Minutes Intravenous 60 min pre-op 08/07/15 1258 08/07/15 1355      Assessment & Plans: Status post lap lysis of adhesions, incisional hernia repair  Post op ileus persists Continue NPO, IV hydration Encouraged OOB, ambulation, IS use Will follow  Earnstine Regal, MD, [facility] Office: [place] 08/10/2015

## 2015-08-11 DIAGNOSIS — Z9889 Other specified postprocedural states: Secondary | ICD-10-CM

## 2015-08-11 DIAGNOSIS — R112 Nausea with vomiting, unspecified: Secondary | ICD-10-CM

## 2015-08-11 LAB — BASIC METABOLIC PANEL
Anion gap: 8 (ref 5–15)
BUN: 18 mg/dL (ref 6–20)
CHLORIDE: 105 mmol/L (ref 101–111)
CO2: 22 mmol/L (ref 22–32)
Calcium: 8.3 mg/dL — ABNORMAL LOW (ref 8.9–10.3)
Creatinine, Ser: 1.32 mg/dL — ABNORMAL HIGH (ref 0.61–1.24)
GFR calc Af Amer: >60 mL/min (ref >60)
GFR calc non Af Amer: 54 mL/min — ABNORMAL LOW (ref >60)
GLUCOSE: 111 mg/dL — AB (ref 65–99)
POTASSIUM: 4.2 mmol/L (ref 3.5–5.1)
Sodium: 135 mmol/L (ref 135–145)

## 2015-08-11 LAB — GLUCOSE, CAPILLARY
GLUCOSE-CAPILLARY: 98 mg/dL (ref 65–99)
Glucose-Capillary: 111 mg/dL — ABNORMAL HIGH (ref 65–99)
Glucose-Capillary: 105 mg/dL — ABNORMAL HIGH (ref 65–99)
Glucose-Capillary: 102 mg/dL — ABNORMAL HIGH (ref 65–99)
Glucose-Capillary: 103 mg/dL — ABNORMAL HIGH (ref 65–99)
Glucose-Capillary: 104 mg/dL — ABNORMAL HIGH (ref 65–99)

## 2015-08-11 LAB — CBC
HEMATOCRIT: 31 % — AB (ref 39.0–52.0)
Hemoglobin: 7.1 g/dL — ABNORMAL LOW (ref 13.0–17.0)
MCH: 21.5 pg — ABNORMAL LOW (ref 26.0–34.0)
MCHC: 22.9 g/dL — AB (ref 30.0–36.0)
MCV: 93.7 fL (ref 78.0–100.0)
Platelets: 212 10*3/uL (ref 150–400)
RBC: 3.31 MIL/uL — ABNORMAL LOW (ref 4.22–5.81)
RDW: 15.2 % (ref 11.5–15.5)
WBC: 13.2 10*3/uL — AB (ref 4.0–10.5)

## 2015-08-11 NOTE — Progress Notes (Signed)
Patient ID: Leonard [name], male   DOB: October 17, 1946, 69 y.o.   MRN: [id number] 4 Days Post-Op  Subjective: Pt feeling better today.  No further nausea or emesis.  Had a BM overnight and he has felt much better since then.  No major return of flatus yet though.    Objective: Vital signs in last 24 hours: Temp:  [98.3 F (36.8 C)-98.7 F (37.1 C)] 98.7 F (37.1 C) (09/26 0404) Pulse Rate:  [84-99] 91 (09/26 0404) Resp:  [10-20] 20 (09/26 0450) BP: (156-194)/(68-84) 156/68 mmHg (09/26 0404) SpO2:  [92 %-95 %] 94 % (09/26 0450) FiO2 (%):  [21 %] 21 % (09/25 1543) Weight:  [138.075 kg (304 lb 6.4 oz)] 138.075 kg (304 lb 6.4 oz) (09/26 0404)    Intake/Output from previous day: 09/25 0701 - 09/26 0700 In: 3535.9 [I.V.:3535.9] Out: 350 [Urine:350] Intake/Output this shift:    PE: Abd: obese, soft, minimal bloating, hypoactive BS, appropriately tender, incisions c/d/i  Lab Results:   Recent Labs  08/09/15 0345 08/11/15 0416  WBC 13.6* 13.2*  HGB 11.7* 7.1*  HCT 36.1* 31.0*  PLT 285 212   BMET  Recent Labs  08/09/15 0345 08/11/15 0416  NA 135 135  K 3.8 4.2  CL 103 105  CO2 23 22  GLUCOSE 143* 111*  BUN 17 18  CREATININE 1.96* 1.32*  CALCIUM 8.5* 8.3*   PT/INR No results for input(s): LABPROT, INR in the last 72 hours. CMP     Component Value Date/Time   NA 135 08/11/2015 0416   NA 140 02/20/2015 1001   K 4.2 08/11/2015 0416   K 4.1 02/20/2015 1001   CL 105 08/11/2015 0416   CL 104 04/17/2013 0857   CO2 22 08/11/2015 0416   CO2 26 02/20/2015 1001   GLUCOSE 111* 08/11/2015 0416   GLUCOSE 98 02/20/2015 1001   GLUCOSE 105* 04/17/2013 0857   BUN 18 08/11/2015 0416   BUN 15.2 02/20/2015 1001   CREATININE 1.32* 08/11/2015 0416   CREATININE 1.1 02/20/2015 1001   CREATININE 1.06 12/19/2014 0926   CALCIUM 8.3* 08/11/2015 0416   CALCIUM 9.2 02/20/2015 1001   PROT 6.9 08/06/2015 0400   PROT 7.4 02/20/2015 1001   ALBUMIN 3.3* 08/06/2015 0400   ALBUMIN 3.7  02/20/2015 1001   AST 16 08/06/2015 0400   AST 23 02/20/2015 1001   ALT 16* 08/06/2015 0400   ALT 28 02/20/2015 1001   ALKPHOS 55 08/06/2015 0400   ALKPHOS 81 02/20/2015 1001   BILITOT 0.7 08/06/2015 0400   BILITOT 0.55 02/20/2015 1001   GFRNONAA 54* 08/11/2015 0416   GFRAA >60 08/11/2015 0416   Lipase     Component Value Date/Time   LIPASE 11* 08/05/2015 0915       Studies/Results: No results found.  Anti-infectives: Anti-infectives    Start     Dose/Rate Route Frequency Ordered Stop   08/07/15 1302  vancomycin (VANCOCIN) 1,500 mg in sodium chloride 0.9 % 500 mL IVPB     1,500 mg 250 mL/hr over 120 Minutes Intravenous 60 min pre-op 08/07/15 1258 08/07/15 1355       Assessment/Plan  POD 4, s/p lap LOA for SBO, Dr. Kieth Brightly -post op ileus, seems to be improving since yesterday.  Had a small BM.  Still with some belching, but no further emesis.  Will hold off on NGT today unless he start throwing up again. -cont NPO x ice for now -hopefully can advance to clears tomorrow -cont mobilization and pulm toileting.  LOS: 6 days    OSBORNE,KELLY E 08/11/2015, 8:49 AM Pager: [phone]

## 2015-08-11 NOTE — Progress Notes (Signed)
[facility] PROGRESS NOTE  Leonard [name] CWC:[id number] DOB: 05-10-1946 DOA: 08/05/2015 PCP: [name], MD   Brief narrative: Patient is a 69 year old Caucasian male with 6 week history of abdominal discomfort. Patient currently being evaluated for SBO. Repeat CT scan for evaluation of abdominal wall fluid collection reported findings suspicious for incarcerated bowel. As such general surgery took patient to OR 08/07/2015  Assessment/Plan: SBO (small bowel obstruction) - General surgery on board and managing; will follow rec's - continue supportive therapy - While patient is nothing by mouth continue maintenance IV fluids -feeling better, no nausea, no vomiting, abd soft and had BM last night; will hold on NGT orders and follow response -will keep an eye on his electrolytes and replete them as needed  Abdominal wall fluid collections: due to incisional hernia -s/p laparoscopic incisional repair -doing better overall and with controlled abd pain -will follow response and advance diet    Essential hypertension -Currently on hydralazine PRN -BP is stable overall -will monitor and adjust medications as needed    CAD (coronary artery disease) - no chest pain reported.    Diabetes mellitus type 2, controlled, without complications -will continue SSI -A1C 6.6    Nausea and vomiting -will follow CCS rec on diet advance process -plan is to keep patient NPO except for ice chips today (9/26)  Obstructive sleep apnea -will continue CPAP QHS    Gout: Stable  GERD (gastroesophageal reflux disease) - continue PPI every 12 hours -GI cocktail PRN when able to tolerate PO  -feels better after having BM  Obesity -Body mass index is 44.63 kg/(m^2). -patient advise to follow low calorie diet and to increase exercise   Code Status: Full Family Communication: No family at bedside  Disposition Plan: Pending improvement in condition; most likely home when medically  ready.   Consultants:  General surgery   procedures: Laparoscopic incisional hernia repair with mesh 08/07/15  Antibiotics:  None   HPI/Subjective: Afebrile, denies CP, no SOB. No nausea or vomiting. Reports no abd pain and had BM last night   Objective: Filed Vitals:   08/11/15 0450  BP:   Pulse:   Temp:   Resp: 20    Intake/Output Summary (Last 24 hours) at 08/11/15 0935 Last data filed at 08/11/15 0452  Gross per 24 hour  Intake 3535.9 ml  Output    350 ml  Net 3185.9 ml   Filed Weights   08/08/15 0504 08/10/15 0646 08/11/15 0404  Weight: 133.448 kg (294 lb 3.2 oz) 136.986 kg (302 lb) 138.075 kg (304 lb 6.4 oz)    Exam:   General: afebrile, feeling much better today and had a BM overnight. Denies nausea and vomiting.    Heart: S1 and S2, no rubs or gallops  Lungs: no wheezing, good air movement, no crackles  Abd: soft, tender to deep palpation, neg BS, no guarding  Ext: no edema, no cyanosis    Data Reviewed: Basic Metabolic Panel:  Recent Labs Lab 08/05/15 1605 08/06/15 0400 08/07/15 0825 08/09/15 0345 08/11/15 0416  NA 137 137 137 135 135  K 3.6 3.8 3.3* 3.8 4.2  CL 100* 102 105 103 105  CO2 27 27 26 23 22   GLUCOSE 130* 120* 129* 143* 111*  BUN 18 19 12 17 18   CREATININE 1.16 1.23 1.07 1.96* 1.32*  CALCIUM 9.3 8.7* 8.5* 8.5* 8.3*  MG 2.2  --  2.0 2.3  --   PHOS 2.8  --   --   --   --  Liver Function Tests:  Recent Labs Lab 08/05/15 0915 08/05/15 1605 08/06/15 0400  AST 21 20 16   ALT 18 17 16*  ALKPHOS 65 67 55  BILITOT 0.7 0.8 0.7  PROT 7.6 8.0 6.9  ALBUMIN 3.8 3.9 3.3*    Recent Labs Lab 08/05/15 0915  LIPASE 11*   CBC:  Recent Labs Lab 08/05/15 1040 08/05/15 1605 08/06/15 0400 08/09/15 0345 08/11/15 0416  WBC 10.5 9.0 9.1 13.6* 13.2*  NEUTROABS 7.4 5.0  --   --   --   HGB 14.0 13.8 13.0 11.7* 7.1*  HCT 41.4 39.9 38.6* 36.1* 31.0*  MCV 90.8 90.7 91.9 93.3 93.7  PLT 323 309 302 285 212   CBG:  Recent  Labs Lab 08/10/15 1137 08/10/15 1630 08/10/15 1948 08/10/15 2353 08/11/15 0353  GLUCAP 130* 107* 94 119* 98    Recent Results (from the past 240 hour(s))  MRSA PCR Screening     Status: None   Collection Time: 08/07/15  8:07 AM  Result Value Ref Range Status   MRSA by PCR NEGATIVE NEGATIVE Final    Comment:        The GeneXpert MRSA Assay (FDA approved for NASAL specimens only), is one component of a comprehensive MRSA colonization surveillance program. It is not intended to diagnose MRSA infection nor to guide or monitor treatment for MRSA infections.      Studies: No results found.  Scheduled Meds: . bisacodyl  10 mg Rectal Daily  . enoxaparin (LOVENOX) injection  60 mg Subcutaneous Q24H  . hydrALAZINE  5 mg Intravenous Q6H  . insulin aspart  0-9 Units Subcutaneous Q4H  . lip balm  1 application Topical BID  . metoCLOPramide (REGLAN) injection  10 mg Intravenous 4 times per day  . morphine   Intravenous 6 times per day  . pantoprazole (PROTONIX) IV  40 mg Intravenous Q12H   Continuous Infusions: . lactated ringers 125 mL/hr at 08/11/15 0147    Time spent: 30 minutes   Barton Dubois  [facility] Pager [phone] If 7PM-7AM, please contact night-coverage at [email or web address], password [facility] 08/11/2015, 9:35 AM  LOS: 6 days

## 2015-08-11 NOTE — Progress Notes (Signed)
   08/11/15 1100  Clinical Encounter Type  Visited With Patient  Visit Type Initial  Consult/Referral To Chaplain  Spiritual Encounters  Spiritual Needs Literature;Emotional;Other (Comment);Prayer Academic librarian)  Stress Factors  Patient Stress Factors Health changes   Chaplain visited with the patient after seeing his length of stay at the hospital. The patient and his son were in the room upon the Chaplain's arrival. The patient was receptive to a visit from the [place].  The patient appeared to be in a good mood and didn't have any complaints. He told the Chaplain that he felt that the staff at [facility] have been doing a great job with his care, and that the staff on the 3rd floor were exceptional.  The patient had a conversation about his spirituality and participation in the [employer]. He requested material on [place], and wants a follow-up visit with the [place]. Chaplain interventions included pastoral conversation, spiritual support and getting together literature that the patient requested.  The Chaplain will continue to follow up with the patient.

## 2015-08-11 NOTE — Progress Notes (Signed)
Patient had a small BM last night. Has voiced no complaints of nausea throughout shift. Still has not pass flatus.

## 2015-08-12 ENCOUNTER — Ambulatory Visit: Payer: No Typology Code available for payment source | Admitting: Gastroenterology

## 2015-08-12 LAB — GLUCOSE, CAPILLARY
GLUCOSE-CAPILLARY: 104 mg/dL — AB (ref 65–99)
GLUCOSE-CAPILLARY: 105 mg/dL — AB (ref 65–99)
GLUCOSE-CAPILLARY: 117 mg/dL — AB (ref 65–99)
Glucose-Capillary: 90 mg/dL (ref 65–99)
Glucose-Capillary: 107 mg/dL — ABNORMAL HIGH (ref 65–99)
Glucose-Capillary: 197 mg/dL — ABNORMAL HIGH (ref 65–99)

## 2015-08-12 MED ORDER — OXYCODONE-ACETAMINOPHEN 5-325 MG PO TABS
1.0000 | ORAL_TABLET | ORAL | Status: DC | PRN
  Administered 2015-08-13 (×2): 1 via ORAL
  Filled 2015-08-12 (×2): qty 1

## 2015-08-12 MED ORDER — MORPHINE SULFATE (PF) 2 MG/ML IV SOLN
2.0000 mg | INTRAVENOUS | Status: DC | PRN
  Administered 2015-08-12 – 2015-08-13 (×3): 4 mg via INTRAVENOUS
  Filled 2015-08-12 (×3): qty 2

## 2015-08-12 NOTE — Progress Notes (Signed)
Patient ID: Leonard [name], male   DOB: April 21, 1946, 68 y.o.   MRN: [id number] 5 Days Post-Op  Subjective: Pt feeling better today.  No further nausea or emesis.  Had a BM overnight and he has felt much better since then.  No major return of flatus yet though.    Objective: Vital signs in last 24 hours: Temp:  [97.9 F (36.6 C)-98.1 F (36.7 C)] 97.9 F (36.6 C) (09/27 0539) Pulse Rate:  [96-104] 104 (09/27 0539) Resp:  [13-23] 20 (09/27 0539) BP: (167-191)/(67-94) 176/90 mmHg (09/27 0539) SpO2:  [93 %-100 %] 98 % (09/27 0539) Weight:  [136.578 kg (301 lb 1.6 oz)] 136.578 kg (301 lb 1.6 oz) (09/27 0539) Last BM Date: 08/11/15  Intake/Output from previous day: 09/26 0701 - 09/27 0700 In: 1200.3 [I.V.:1200.3] Out: -  Intake/Output this shift:    PE: Abd: obese, soft, minimal bloating, hypoactive BS, appropriately tender, incisions c/d/i  Lab Results:   Recent Labs  08/11/15 0416  WBC 13.2*  HGB 7.1*  HCT 31.0*  PLT 212   BMET  Recent Labs  08/11/15 0416  NA 135  K 4.2  CL 105  CO2 22  GLUCOSE 111*  BUN 18  CREATININE 1.32*  CALCIUM 8.3*   PT/INR No results for input(s): LABPROT, INR in the last 72 hours. CMP     Component Value Date/Time   NA 135 08/11/2015 0416   NA 140 02/20/2015 1001   K 4.2 08/11/2015 0416   K 4.1 02/20/2015 1001   CL 105 08/11/2015 0416   CL 104 04/17/2013 0857   CO2 22 08/11/2015 0416   CO2 26 02/20/2015 1001   GLUCOSE 111* 08/11/2015 0416   GLUCOSE 98 02/20/2015 1001   GLUCOSE 105* 04/17/2013 0857   BUN 18 08/11/2015 0416   BUN 15.2 02/20/2015 1001   CREATININE 1.32* 08/11/2015 0416   CREATININE 1.1 02/20/2015 1001   CREATININE 1.06 12/19/2014 0926   CALCIUM 8.3* 08/11/2015 0416   CALCIUM 9.2 02/20/2015 1001   PROT 6.9 08/06/2015 0400   PROT 7.4 02/20/2015 1001   ALBUMIN 3.3* 08/06/2015 0400   ALBUMIN 3.7 02/20/2015 1001   AST 16 08/06/2015 0400   AST 23 02/20/2015 1001   ALT 16* 08/06/2015 0400   ALT 28 02/20/2015  1001   ALKPHOS 55 08/06/2015 0400   ALKPHOS 81 02/20/2015 1001   BILITOT 0.7 08/06/2015 0400   BILITOT 0.55 02/20/2015 1001   GFRNONAA 54* 08/11/2015 0416   GFRAA >60 08/11/2015 0416   Lipase     Component Value Date/Time   LIPASE 11* 08/05/2015 0915       Studies/Results: No results found.  Anti-infectives: Anti-infectives    Start     Dose/Rate Route Frequency Ordered Stop   08/07/15 1302  vancomycin (VANCOCIN) 1,500 mg in sodium chloride 0.9 % 500 mL IVPB     1,500 mg 250 mL/hr over 120 Minutes Intravenous 60 min pre-op 08/07/15 1258 08/07/15 1355       Assessment/Plan  POD 5, s/p lap LOA for SBO, Dr. Kieth Brightly -post op ileus, seems to be resolving.  Had a small BM and passing flatus.  -start clears, advance as tolerated -d/c PCA -cont mobilization and pulm toileting.  LOS: 7 days    Leonard [name], Leonard C. 01/04/2541, 7:06 AM

## 2015-08-12 NOTE — Progress Notes (Signed)
[facility] PROGRESS NOTE  Leonard ARENSON JSH:[id number] DOB: Oct 03, 1946 DOA: 08/05/2015 PCP: [name], MD   Brief narrative: Patient is a 69 year old Caucasian male with 6 week history of abdominal discomfort. Patient currently being evaluated for SBO. Repeat CT scan for evaluation of abdominal wall fluid collection reported findings suspicious for incarcerated bowel. As such general surgery took patient to OR 08/07/2015  Assessment/Plan: SBO (small bowel obstruction) -General surgery on board and managing; will follow rec's -continue supportive therapy -feeling better, no nausea, no vomiting, abd soft and had another small BM overnight; Per CCS ok to start CLD trial again and follow response -will keep an eye on his electrolytes and replete them as needed  Abdominal wall fluid collections: due to incisional hernia -s/p laparoscopic incisional repair -doing better overall and with controlled abd pain -will follow response and advance diet as per CCS rec's -continue PRN pain meds, but will stop PCA    Essential hypertension -Currently on hydralazine PRN -BP is stable overall -will monitor and adjust medications as needed    CAD (coronary artery disease) - no chest pain reported. -no SOB    Diabetes mellitus type 2, controlled, without complications -will continue SSI -A1C 6.6 -resume home medication regimen when tolerating diet     Nausea and vomiting -will follow CCS rec on diet advance process -plan is to do trial of CLD (9/27) -continue PRN antiemetics   Obstructive sleep apnea -will continue CPAP QHS    Gout: Stable -no flares  GERD (gastroesophageal reflux disease) -continue PPI every 12 hours -will transition medications to PO once tolerating diet   Obesity -Body mass index is 44.14 kg/(m^2). -patient advise to follow low calorie diet and to increase exercise   Code Status: Full Family Communication: No family at bedside  Disposition Plan:  Pending improvement in condition; most likely home when medically ready.   Consultants:  General surgery   procedures: Laparoscopic incisional hernia repair with mesh 08/07/15  Antibiotics:  None   HPI/Subjective: Afebrile, denies CP, no SOB. Patient no significant abd pain, no vomiting and has another small BM overnight  Objective: Filed Vitals:   08/12/15 0539  BP: 176/90  Pulse: 104  Temp: 97.9 F (36.6 C)  Resp: 20    Intake/Output Summary (Last 24 hours) at 08/12/15 0938 Last data filed at 08/11/15 1420  Gross per 24 hour  Intake 1200.33 ml  Output      0 ml  Net 1200.33 ml   Filed Weights   08/10/15 0646 08/11/15 0404 08/12/15 0539  Weight: 136.986 kg (302 lb) 138.075 kg (304 lb 6.4 oz) 136.578 kg (301 lb 1.6 oz)    Exam:   General: afebrile, feeling much better today and had another small BM overnight. Denies any further vomiting. No abd pain  Heart: S1 and S2, no rubs or gallops  Lungs: no wheezing, good air movement, no crackles  Abd: soft, decrease but present BS, no guarding and no significant tenderness   Ext: no edema, no cyanosis    Data Reviewed: Basic Metabolic Panel:  Recent Labs Lab 08/05/15 1605 08/06/15 0400 08/07/15 0825 08/09/15 0345 08/11/15 0416  NA 137 137 137 135 135  K 3.6 3.8 3.3* 3.8 4.2  CL 100* 102 105 103 105  CO2 27 27 26 23 22   GLUCOSE 130* 120* 129* 143* 111*  BUN 18 19 12 17 18   CREATININE 1.16 1.23 1.07 1.96* 1.32*  CALCIUM 9.3 8.7* 8.5* 8.5* 8.3*  MG 2.2  --  2.0 2.3  --   PHOS 2.8  --   --   --   --    Liver Function Tests:  Recent Labs Lab 08/05/15 1605 08/06/15 0400  AST 20 16  ALT 17 16*  ALKPHOS 67 55  BILITOT 0.8 0.7  PROT 8.0 6.9  ALBUMIN 3.9 3.3*   CBC:  Recent Labs Lab 08/05/15 1040 08/05/15 1605 08/06/15 0400 08/09/15 0345 08/11/15 0416  WBC 10.5 9.0 9.1 13.6* 13.2*  NEUTROABS 7.4 5.0  --   --   --   HGB 14.0 13.8 13.0 11.7* 7.1*  HCT 41.4 39.9 38.6* 36.1* 31.0*  MCV  90.8 90.7 91.9 93.3 93.7  PLT 323 309 302 285 212   CBG:  Recent Labs Lab 08/11/15 1841 08/11/15 2120 08/12/15 0005 08/12/15 0527 08/12/15 0742  GLUCAP 103* 104* 105* 90 104*    Recent Results (from the past 240 hour(s))  MRSA PCR Screening     Status: None   Collection Time: 08/07/15  8:07 AM  Result Value Ref Range Status   MRSA by PCR NEGATIVE NEGATIVE Final    Comment:        The GeneXpert MRSA Assay (FDA approved for NASAL specimens only), is one component of a comprehensive MRSA colonization surveillance program. It is not intended to diagnose MRSA infection nor to guide or monitor treatment for MRSA infections.      Studies: No results found.  Scheduled Meds: . bisacodyl  10 mg Rectal Daily  . enoxaparin (LOVENOX) injection  60 mg Subcutaneous Q24H  . hydrALAZINE  5 mg Intravenous Q6H  . insulin aspart  0-9 Units Subcutaneous Q4H  . lip balm  1 application Topical BID  . metoCLOPramide (REGLAN) injection  10 mg Intravenous 4 times per day  . pantoprazole (PROTONIX) IV  40 mg Intravenous Q12H   Continuous Infusions: . lactated ringers 125 mL/hr at 08/12/15 5102    Time spent: 30 minutes   Barton Dubois  [facility] Pager [phone] If 7PM-7AM, please contact night-coverage at [email or web address], password [facility] 08/12/2015, 9:38 AM  LOS: 7 days

## 2015-08-12 NOTE — Care Management Note (Signed)
Case Management Note  Patient Details  Name: Leonard [name] MRN: [id number] Date of Birth: 1946-09-04  Subjective/Objective:              69 yo admitted with SBO      Action/Plan: Pt from home with wife  Expected Discharge Date:   (UNKNOWN)               Expected Discharge Plan:  Home/Self Care  In-House Referral:     Discharge planning Services  CM Consult  Post Acute Care Choice:    Choice offered to:     DME Arranged:    DME Agency:     HH Arranged:    [place] Agency:     Status of Service:  In process, will continue to follow  Medicare Important Message Given:    Date Medicare IM Given:    Medicare IM give by:    Date Additional Medicare IM Given:    Additional Medicare Important Message give by:     If discussed at [place] of Stay Meetings, dates discussed:    Additional Comments: Chart reviewed and CM following for DC needs. Lynnell Catalan, RN 08/12/2015, 1:12 PM

## 2015-08-12 NOTE — Progress Notes (Signed)
Pt reported a small soft BM after dulcolax suppository

## 2015-08-13 ENCOUNTER — Inpatient Hospital Stay (HOSPITAL_COMMUNITY): Payer: No Typology Code available for payment source

## 2015-08-13 LAB — BASIC METABOLIC PANEL
ANION GAP: 8 (ref 5–15)
BUN: 14 mg/dL (ref 6–20)
CO2: 24 mmol/L (ref 22–32)
Calcium: 8.5 mg/dL — ABNORMAL LOW (ref 8.9–10.3)
Chloride: 105 mmol/L (ref 101–111)
Creatinine, Ser: 1.3 mg/dL — ABNORMAL HIGH (ref 0.61–1.24)
GFR, EST NON AFRICAN AMERICAN: 55 mL/min — AB (ref >60)
Glucose, Bld: 119 mg/dL — ABNORMAL HIGH (ref 65–99)
POTASSIUM: 3.9 mmol/L (ref 3.5–5.1)
SODIUM: 137 mmol/L (ref 135–145)

## 2015-08-13 LAB — CBC
HCT: 34 % — ABNORMAL LOW (ref 39.0–52.0)
Hemoglobin: 11 g/dL — ABNORMAL LOW (ref 13.0–17.0)
MCH: 29.6 pg (ref 26.0–34.0)
MCHC: 32.4 g/dL (ref 30.0–36.0)
MCV: 91.4 fL (ref 78.0–100.0)
PLATELETS: 231 10*3/uL (ref 150–400)
RBC: 3.72 MIL/uL — AB (ref 4.22–5.81)
RDW: 14.9 % (ref 11.5–15.5)
WBC: 16.7 10*3/uL — AB (ref 4.0–10.5)

## 2015-08-13 LAB — GLUCOSE, CAPILLARY
GLUCOSE-CAPILLARY: 110 mg/dL — AB (ref 65–99)
GLUCOSE-CAPILLARY: 111 mg/dL — AB (ref 65–99)
GLUCOSE-CAPILLARY: 115 mg/dL — AB (ref 65–99)
GLUCOSE-CAPILLARY: 123 mg/dL — AB (ref 65–99)
GLUCOSE-CAPILLARY: 124 mg/dL — AB (ref 65–99)
Glucose-Capillary: 132 mg/dL — ABNORMAL HIGH (ref 65–99)

## 2015-08-13 LAB — URINALYSIS, ROUTINE W REFLEX MICROSCOPIC
Glucose, UA: NEGATIVE mg/dL
HGB URINE DIPSTICK: NEGATIVE
Ketones, ur: NEGATIVE mg/dL
Leukocytes, UA: NEGATIVE
Nitrite: NEGATIVE
PH: 5.5 (ref 5.0–8.0)
Protein, ur: 30 mg/dL — AB
SPECIFIC GRAVITY, URINE: 1.022 (ref 1.005–1.030)
Urobilinogen, UA: 1 mg/dL (ref 0.0–1.0)

## 2015-08-13 LAB — MAGNESIUM: MAGNESIUM: 2 mg/dL (ref 1.7–2.4)

## 2015-08-13 LAB — URINE MICROSCOPIC-ADD ON

## 2015-08-13 MED ORDER — BENAZEPRIL HCL 10 MG PO TABS
20.0000 mg | ORAL_TABLET | Freq: Every day | ORAL | Status: DC
  Administered 2015-08-13 – 2015-08-15 (×3): 20 mg via ORAL
  Filled 2015-08-13 (×4): qty 2

## 2015-08-13 MED ORDER — AMLODIPINE BESYLATE 10 MG PO TABS
10.0000 mg | ORAL_TABLET | Freq: Every day | ORAL | Status: DC
  Administered 2015-08-13 – 2015-08-15 (×3): 10 mg via ORAL
  Filled 2015-08-13 (×3): qty 1

## 2015-08-13 MED ORDER — OXYCODONE-ACETAMINOPHEN 5-325 MG PO TABS: 1.0000 | ORAL_TABLET | ORAL | Status: DC | PRN

## 2015-08-13 NOTE — Discharge Instructions (Signed)
[facility] _______[facility]  UMBILICAL OR INGUINAL HERNIA REPAIR: POST OP INSTRUCTIONS  Always review your discharge instruction sheet given to you by the facility where your surgery was performed. IF YOU HAVE DISABILITY OR FAMILY LEAVE FORMS, YOU MUST BRING THEM TO THE OFFICE FOR PROCESSING.   DO NOT GIVE THEM TO YOUR DOCTOR.  1. A  prescription for pain medication may be given to you upon discharge.  Take your pain medication as prescribed, if needed.  If narcotic pain medicine is not needed, then you may take acetaminophen (Tylenol) or ibuprofen (Advil) as needed. 2. Take your usually prescribed medications unless otherwise directed. 3. If you need a refill on your pain medication, please contact your pharmacy.  They will contact our office to request authorization. Prescriptions will not be filled after 5 pm or on week-ends. 4. You should follow a light diet the first 24 hours after arrival home, such as soup and crackers, etc.  Be sure to include lots of fluids daily.  Resume your normal diet the day after surgery. 5. Most patients will experience some swelling and bruising around the umbilicus or in the groin and scrotum.  Ice packs and reclining will help.  Swelling and bruising can take several days to resolve.  6. It is common to experience some constipation if taking pain medication after surgery.  Increasing fluid intake and taking a stool softener (such as Colace) will usually help or prevent this problem from occurring.  A mild laxative (Milk of Magnesia or Miralax) should be taken according to package directions if there are no bowel movements after 48 hours. 7. Unless discharge instructions indicate otherwise, you may remove your bandages 24-48 hours after surgery, and you may shower at that time.  You may have steri-strips (small skin tapes) in place directly over the incision.  These strips should be left on the skin for 7-10 days.  If your surgeon used skin glue on the  incision, you may shower in 24 hours.  The glue will flake off over the next 2-3 weeks.  Any sutures or staples will be removed at the office during your follow-up visit. 8. ACTIVITIES:  You may resume regular (light) daily activities beginning the next day--such as daily self-care, walking, climbing stairs--gradually increasing activities as tolerated.  You may have sexual intercourse when it is comfortable.  Refrain from any heavy lifting or straining until approved by your doctor. a. You may drive when you are no longer taking prescription pain medication, you can comfortably wear a seatbelt, and you can safely maneuver your car and apply brakes. b. RETURN TO WORK:  __________________________________________________________ 9. You should see your doctor in the office for a follow-up appointment approximately 2-3 weeks after your surgery.  Make sure that you call for this appointment within a day or two after you arrive home to insure a convenient appointment time. 10. OTHER INSTRUCTIONS:  __________________________________________________________________________________________________________________________________________________________________________________________  WHEN TO CALL YOUR DOCTOR: 1. Fever over 101.0 2. Inability to urinate 3. Nausea and/or vomiting 4. Extreme swelling or bruising 5. Continued bleeding from incision. 6. Increased pain, redness, or drainage from the incision  The clinic staff is available to answer your questions during regular business hours.  Please don't hesitate to call and ask to speak to one of the nurses for clinical concerns.  If you have a medical emergency, go to the nearest emergency room or call 911.  A surgeon from [facility] is always on call at the hospital     [address], [address], [place], [place]  [address] ?  [address] [address], [place], [place]   [address] [phone] ? [phone] ? FAX [phone] Web site:  [email or web address]  

## 2015-08-13 NOTE — Progress Notes (Signed)
[facility] PROGRESS NOTE  Leonard [name] WER:[id number] DOB: [birth year] DOA: 08/05/2015 PCP: [name], MD  Assessment/Plan: 1. Small bowel obstruction/incarcerated hernia -He was taken to the operating room on 08/07/2015 undergoing laparoscopic incisional hernia repair with placement of mesh. -Postoperative course complicated by ileus -Patient showing improvements over the last several days having flatus and bowel movements with resolution to nausea and vomiting. -Lab work this morning revealed upward trend in his white count of 13,200-16,700. He had a MAXIMUM TEMPERATURE of 100.1. However, on physical examination he was nonfocal. Will further workup with a chest x-ray and urinalysis. Hold antimicrobial therapy for now.  2.  Hypertension -Patient's systolic blood pressures elevated, having systolic blood pressures in the 150s to 180s; will restart amlodipine and benazepril.   3.  Diabetes mellitus. -Blood sugars controlled -HisTradjenta was held on admission given nothing by mouth status. -Will monitor his blood sugars as his diet is being advanced. -Restarting ARB  4.  History of coronary artery disease. -He does not appear to have active cardiac issues.  5.  Dyslipidemia -Will resume Lipitor 10 mg by mouth daily  Code Status: Full code Family Communication: Family not present Disposition Plan: Anticipate discharge home when medically stable   Consultants:  General surgery  Procedures: Laparoscopic incisional hernia repair with placement of mesh, procedure performed on 08/07/2015    HPI/Subjective: Leonard [name] is a pleasant 69 year old gentleman with a history of prostate cancer, hypertension, diabetes mellitus, dyslipidemia, admitted to the medicine service on 08/05/2015 when he presented with complaints of abdominal pain that was associated with nausea and vomiting. A recent CT scan done on 08/01/2015 showed nonspecific subcutaneous fluid collection in the right  mid abdomen. CT scan of abdomen and pelvis was repeated on 08/06/2015 that reveals dilated loops of small bowel compatible with a small bowel obstruction. Radiology also reported an abdominal wall hernia along the right lateral abdominal wall with findings concerning for an incarcerated hernia leading to small bowel obstruction. General surgery was consulted, he was taken to the operating room on 08/07/2015 undergoing laparoscopic incisional hernia repair with placement of mesh. He tolerated procedure well. His postoperative course was complicated by ileus. This resolved with conservative management as his diet was slowly advanced.  Objective: Filed Vitals:   08/13/15 0820  BP: 180/77  Pulse:   Temp:   Resp:     Intake/Output Summary (Last 24 hours) at 08/13/15 1054 Last data filed at 08/13/15 0800  Gross per 24 hour  Intake   1850 ml  Output    325 ml  Net   1525 ml   Filed Weights   08/11/15 0404 08/12/15 0539 08/13/15 0443  Weight: 138.075 kg (304 lb 6.4 oz) 136.578 kg (301 lb 1.6 oz) 137.077 kg (302 lb 3.2 oz)    Exam:   General:  Patient is in no acute distress, awake and alert oriented, nonfocal on exam  Cardiovascular: Regular rate and rhythm normal S1-S2 no murmurs or gallops  Respiratory: Normal respiratory effort, lungs are clear to auscultation bilaterally  Abdomen: Abdomen, obese, soft, having mild tenderness over her surgical incision site otherwise no guarding or rebound tenderness  Musculoskeletal: No extremity edema  Data Reviewed: Basic Metabolic Panel:  Recent Labs Lab 08/07/15 0825 08/09/15 0345 08/11/15 0416 08/13/15 0525  NA 137 135 135 137  K 3.3* 3.8 4.2 3.9  CL 105 103 105 105  CO2 26 23 22 24   GLUCOSE 129* 143* 111* 119*  BUN 12 17 18 14   CREATININE 1.07  1.96* 1.32* 1.30*  CALCIUM 8.5* 8.5* 8.3* 8.5*  MG 2.0 2.3  --  2.0   Liver Function Tests: No results for input(s): AST, ALT, ALKPHOS, BILITOT, PROT, ALBUMIN in the last 168 hours. No  results for input(s): LIPASE, AMYLASE in the last 168 hours. No results for input(s): AMMONIA in the last 168 hours. CBC:  Recent Labs Lab 08/09/15 0345 08/11/15 0416 08/13/15 0525  WBC 13.6* 13.2* 16.7*  HGB 11.7* 7.1* 11.0*  HCT 36.1* 31.0* 34.0*  MCV 93.3 93.7 91.4  PLT 285 212 231   Cardiac Enzymes: No results for input(s): CKTOTAL, CKMB, CKMBINDEX, TROPONINI in the last 168 hours. BNP (last 3 results) No results for input(s): BNP in the last 8760 hours.  ProBNP (last 3 results) No results for input(s): PROBNP in the last 8760 hours.  CBG:  Recent Labs Lab 08/12/15 1604 08/12/15 1938 08/12/15 2358 08/13/15 0435 08/13/15 0750  GLUCAP 197* 117* 123* 124* 115*    Recent Results (from the past 240 hour(s))  MRSA PCR Screening     Status: None   Collection Time: 08/07/15  8:07 AM  Result Value Ref Range Status   MRSA by PCR NEGATIVE NEGATIVE Final    Comment:        The GeneXpert MRSA Assay (FDA approved for NASAL specimens only), is one component of a comprehensive MRSA colonization surveillance program. It is not intended to diagnose MRSA infection nor to guide or monitor treatment for MRSA infections.      Studies: No results found.  Scheduled Meds: . enoxaparin (LOVENOX) injection  60 mg Subcutaneous Q24H  . hydrALAZINE  5 mg Intravenous Q6H  . insulin aspart  0-9 Units Subcutaneous Q4H  . lip balm  1 application Topical BID  . pantoprazole (PROTONIX) IV  40 mg Intravenous Q12H   Continuous Infusions: . lactated ringers 50 mL/hr at 08/13/15 0024    Principal Problem:   SBO (small bowel obstruction) Active Problems:   Dyslipidemia   Obstructive sleep apnea   Essential hypertension   CAD (coronary artery disease)   History of prostate cancer   Diabetes mellitus type 2, controlled, without complications   Abdominal wall fluid collections   Nausea and vomiting   Gout   GERD (gastroesophageal reflux disease)   Morbid obesity   ARF (acute  renal failure)    Time spent: 30 minutes    Kelvin Cellar  [facility] Pager [phone]. If 7PM-7AM, please contact night-coverage at [email or web address], password [facility] 08/13/2015, 10:54 AM  LOS: 8 days

## 2015-08-13 NOTE — Progress Notes (Signed)
Patient ID: Leonard [name], male   DOB: September 20, 1946, 69 y.o.   MRN: [id number] 6 Days Post-Op  Subjective: Pt feeling well this morning.  Up in chair.  Tolerating clear liquids and still passing flatus and BMs  Objective: Vital signs in last 24 hours: Temp:  [98.3 F (36.8 C)-100.1 F (37.8 C)] 98.3 F (36.8 C) (09/28 0443) Pulse Rate:  [94-111] 94 (09/28 0443) Resp:  [20-22] 20 (09/28 0443) BP: (154-182)/(69-89) 169/69 mmHg (09/28 0443) SpO2:  [94 %-97 %] 94 % (09/28 0443) Weight:  [137.077 kg (302 lb 3.2 oz)] 137.077 kg (302 lb 3.2 oz) (09/28 0443) Last BM Date: 08/12/15  Intake/Output from previous day: 09/27 0701 - 09/28 0700 In: 1610 [P.O.:360; I.V.:1250] Out: 550 [Urine:550] Intake/Output this shift:    PE: Abd: soft, appropriately tender, +BS, ND, obese, incisions c/d/i Heart: regular  Lab Results:   Recent Labs  08/11/15 0416 08/13/15 0525  WBC 13.2* 16.7*  HGB 7.1* 11.0*  HCT 31.0* 34.0*  PLT 212 231   BMET  Recent Labs  08/11/15 0416 08/13/15 0525  NA 135 137  K 4.2 3.9  CL 105 105  CO2 22 24  GLUCOSE 111* 119*  BUN 18 14  CREATININE 1.32* 1.30*  CALCIUM 8.3* 8.5*   PT/INR No results for input(s): LABPROT, INR in the last 72 hours. CMP     Component Value Date/Time   NA 137 08/13/2015 0525   NA 140 02/20/2015 1001   K 3.9 08/13/2015 0525   K 4.1 02/20/2015 1001   CL 105 08/13/2015 0525   CL 104 04/17/2013 0857   CO2 24 08/13/2015 0525   CO2 26 02/20/2015 1001   GLUCOSE 119* 08/13/2015 0525   GLUCOSE 98 02/20/2015 1001   GLUCOSE 105* 04/17/2013 0857   BUN 14 08/13/2015 0525   BUN 15.2 02/20/2015 1001   CREATININE 1.30* 08/13/2015 0525   CREATININE 1.1 02/20/2015 1001   CREATININE 1.06 12/19/2014 0926   CALCIUM 8.5* 08/13/2015 0525   CALCIUM 9.2 02/20/2015 1001   PROT 6.9 08/06/2015 0400   PROT 7.4 02/20/2015 1001   ALBUMIN 3.3* 08/06/2015 0400   ALBUMIN 3.7 02/20/2015 1001   AST 16 08/06/2015 0400   AST 23 02/20/2015 1001   ALT  16* 08/06/2015 0400   ALT 28 02/20/2015 1001   ALKPHOS 55 08/06/2015 0400   ALKPHOS 81 02/20/2015 1001   BILITOT 0.7 08/06/2015 0400   BILITOT 0.55 02/20/2015 1001   GFRNONAA 55* 08/13/2015 0525   GFRAA >60 08/13/2015 0525   Lipase     Component Value Date/Time   LIPASE 11* 08/05/2015 0915       Studies/Results: No results found.  Anti-infectives: Anti-infectives    Start     Dose/Rate Route Frequency Ordered Stop   08/07/15 1302  vancomycin (VANCOCIN) 1,500 mg in sodium chloride 0.9 % 500 mL IVPB     1,500 mg 250 mL/hr over 120 Minutes Intravenous 60 min pre-op 08/07/15 1258 08/07/15 1355       Assessment/Plan   POD 6, s/p lap ventral hernia repair with mesh, Dr. Kieth Brightly -patient doing well.  Advance to carb mod diet -transition to oral pain medications -pt's WBC did increase to 16.7K.  No overt post op signs of infection. ? Need to check UA or CXR.  Will d/w Medicine   LOS: 8 days    OSBORNE,KELLY E 08/13/2015, 8:10 AM Pager: [phone]

## 2015-08-14 ENCOUNTER — Inpatient Hospital Stay (HOSPITAL_COMMUNITY): Payer: No Typology Code available for payment source

## 2015-08-14 DIAGNOSIS — D72829 Elevated white blood cell count, unspecified: Secondary | ICD-10-CM

## 2015-08-14 DIAGNOSIS — R197 Diarrhea, unspecified: Secondary | ICD-10-CM

## 2015-08-14 LAB — GLUCOSE, CAPILLARY
GLUCOSE-CAPILLARY: 100 mg/dL — AB (ref 65–99)
GLUCOSE-CAPILLARY: 124 mg/dL — AB (ref 65–99)
GLUCOSE-CAPILLARY: 83 mg/dL (ref 65–99)
GLUCOSE-CAPILLARY: 95 mg/dL (ref 65–99)
Glucose-Capillary: 105 mg/dL — ABNORMAL HIGH (ref 65–99)
Glucose-Capillary: 131 mg/dL — ABNORMAL HIGH (ref 65–99)

## 2015-08-14 LAB — CBC
HEMATOCRIT: 34.8 % — AB (ref 39.0–52.0)
HEMOGLOBIN: 11.4 g/dL — AB (ref 13.0–17.0)
MCH: 29.7 pg (ref 26.0–34.0)
MCHC: 32.8 g/dL (ref 30.0–36.0)
MCV: 90.6 fL (ref 78.0–100.0)
Platelets: 254 10*3/uL (ref 150–400)
RBC: 3.84 MIL/uL — AB (ref 4.22–5.81)
RDW: 14.7 % (ref 11.5–15.5)
WBC: 19.5 10*3/uL — AB (ref 4.0–10.5)

## 2015-08-14 LAB — C DIFFICILE QUICK SCREEN W PCR REFLEX
C DIFFICILE (CDIFF) INTERP: NEGATIVE
C Diff antigen: NEGATIVE
C Diff toxin: NEGATIVE

## 2015-08-14 LAB — BASIC METABOLIC PANEL
ANION GAP: 9 (ref 5–15)
BUN: 16 mg/dL (ref 6–20)
CHLORIDE: 105 mmol/L (ref 101–111)
CO2: 23 mmol/L (ref 22–32)
Calcium: 8.3 mg/dL — ABNORMAL LOW (ref 8.9–10.3)
Creatinine, Ser: 1.24 mg/dL (ref 0.61–1.24)
GFR calc non Af Amer: 58 mL/min — ABNORMAL LOW (ref >60)
Glucose, Bld: 102 mg/dL — ABNORMAL HIGH (ref 65–99)
Potassium: 3.7 mmol/L (ref 3.5–5.1)
Sodium: 137 mmol/L (ref 135–145)

## 2015-08-14 MED ORDER — IOHEXOL 300 MG/ML  SOLN
25.0000 mL | INTRAMUSCULAR | Status: AC
  Administered 2015-08-14 (×2): 25 mL via INTRAVENOUS

## 2015-08-14 MED ORDER — IOHEXOL 300 MG/ML  SOLN
100.0000 mL | Freq: Once | INTRAMUSCULAR | Status: AC | PRN
  Administered 2015-08-14: 100 mL via INTRAVENOUS

## 2015-08-14 NOTE — Clinical Documentation Improvement (Signed)
  Internal Medicine  Can a diagnosis be made secondary to elevated Creatinine's?   Acute Renal Failure/Acute Kidney Injury now resolving  Acute on Chronic Renal Failure  Chronic Renal Failure - please document stage of if applicable: GFR's noted below  Other  Clinically Undetermined  Supporting Information:  Creatinine's have increased from 1.03 on admission up to 1.96 on 9/24; trending downward last 5 days  Black male  GFR's for the current admission have ranged from 39 to > 60  Please exercise your independent, professional judgment when responding. A specific answer is not anticipated or expected.  Thank You,  Zoila Shutter BSN, [place] [phone]

## 2015-08-14 NOTE — Progress Notes (Signed)
[facility] PROGRESS NOTE  Leonard [name] WNI:[id number] DOB: 02-04-1946 DOA: 08/05/2015 PCP: [name], MD  Assessment/Plan: 1. Small bowel obstruction/incarcerated hernia -He was taken to the operating room on 08/07/2015 undergoing laparoscopic incisional hernia repair with placement of mesh. -Postoperative course complicated by ileus -Patient showing improvements over the last several days having flatus and bowel movements with resolution to nausea and vomiting. -Lab work on 08/13/2015 revealed upward trend in his white count of 13,200-16,700. He had a MAXIMUM TEMPERATURE of 100.1. However, on physical examination he was nonfocal. Will further workup with a chest x-ray and urinalysis. Hold antimicrobial therapy for now. -On 08/14/2015 labs showing further increase in white count to 19,500. On exam he remains nontoxic appearing and nonfocal. -He complained of diarrhea making C. difficile colitis a possibility however stool for C. difficile toxin came back negative. -Plan to proceed with CT scan of abdomen and pelvis with IV contrast  2.  Hypertension -Patient's systolic blood pressures elevated, having systolic blood pressures in the 150s to 160's -I restarted amlodipine and benazepril on 08/13/2015.  -We'll continue monitoring his blood pressures.  3.  Diabetes mellitus. -Blood sugars controlled -HisTradjenta was held on admission given nothing by mouth status. -Will monitor his blood sugars as his diet is being advanced. -Restarting ARB  4.  History of coronary artery disease. -He does not appear to have active cardiac issues.  5.  Dyslipidemia -Will resume Lipitor 10 mg by mouth daily  Code Status: Full code Family Communication: Family not present Disposition Plan: Anticipate discharge home when medically stable   Consultants:  General surgery  Procedures: Laparoscopic incisional hernia repair with placement of mesh, procedure performed on  08/07/2015    HPI/Subjective: Mr. Leonard [name] is a pleasant 69 year old gentleman with a history of prostate cancer, hypertension, diabetes mellitus, dyslipidemia, admitted to the medicine service on 08/05/2015 when he presented with complaints of abdominal pain that was associated with nausea and vomiting. A recent CT scan done on 08/01/2015 showed nonspecific subcutaneous fluid collection in the right mid abdomen. CT scan of abdomen and pelvis was repeated on 08/06/2015 that reveals dilated loops of small bowel compatible with a small bowel obstruction. Radiology also reported an abdominal wall hernia along the right lateral abdominal wall with findings concerning for an incarcerated hernia leading to small bowel obstruction. General surgery was consulted, he was taken to the operating room on 08/07/2015 undergoing laparoscopic incisional hernia repair with placement of mesh. He tolerated procedure well. His postoperative course was complicated by ileus. This resolved with conservative management as his diet was slowly advanced.  Objective: Filed Vitals:   08/14/15 1300  BP: 157/75  Pulse: 87  Temp: 98.2 F (36.8 C)  Resp:     Intake/Output Summary (Last 24 hours) at 08/14/15 1634 Last data filed at 08/14/15 0600  Gross per 24 hour  Intake 1069.17 ml  Output    150 ml  Net 919.17 ml   Filed Weights   08/12/15 0539 08/13/15 0443 08/14/15 0608  Weight: 136.578 kg (301 lb 1.6 oz) 137.077 kg (302 lb 3.2 oz) 138.619 kg (305 lb 9.6 oz)    Exam:   General:  Patient is in no acute distress, awake and alert oriented, nonfocal on exam  Cardiovascular: Regular rate and rhythm normal S1-S2 no murmurs or gallops  Respiratory: Normal respiratory effort, lungs are clear to auscultation bilaterally  Abdomen: Abdomen, obese, soft, having mild tenderness over her surgical incision site otherwise no guarding or rebound tenderness  Musculoskeletal: No extremity  edema  Data Reviewed: Basic  Metabolic Panel:  Recent Labs Lab 08/09/15 0345 08/11/15 0416 08/13/15 0525 08/14/15 0435  NA 135 135 137 137  K 3.8 4.2 3.9 3.7  CL 103 105 105 105  CO2 23 22 24 23   GLUCOSE 143* 111* 119* 102*  BUN 17 18 14 16   CREATININE 1.96* 1.32* 1.30* 1.24  CALCIUM 8.5* 8.3* 8.5* 8.3*  MG 2.3  --  2.0  --    Liver Function Tests: No results for input(s): AST, ALT, ALKPHOS, BILITOT, PROT, ALBUMIN in the last 168 hours. No results for input(s): LIPASE, AMYLASE in the last 168 hours. No results for input(s): AMMONIA in the last 168 hours. CBC:  Recent Labs Lab 08/09/15 0345 08/11/15 0416 08/13/15 0525 08/14/15 0435  WBC 13.6* 13.2* 16.7* 19.5*  HGB 11.7* 7.1* 11.0* 11.4*  HCT 36.1* 31.0* 34.0* 34.8*  MCV 93.3 93.7 91.4 90.6  PLT 285 212 231 254   Cardiac Enzymes: No results for input(s): CKTOTAL, CKMB, CKMBINDEX, TROPONINI in the last 168 hours. BNP (last 3 results) No results for input(s): BNP in the last 8760 hours.  ProBNP (last 3 results) No results for input(s): PROBNP in the last 8760 hours.  CBG:  Recent Labs Lab 08/13/15 2111 08/14/15 0007 08/14/15 0405 08/14/15 0754 08/14/15 1152  GLUCAP 111* 100* 95 105* 124*    Recent Results (from the past 240 hour(s))  MRSA PCR Screening     Status: None   Collection Time: 08/07/15  8:07 AM  Result Value Ref Range Status   MRSA by PCR NEGATIVE NEGATIVE Final    Comment:        The GeneXpert MRSA Assay (FDA approved for NASAL specimens only), is one component of a comprehensive MRSA colonization surveillance program. It is not intended to diagnose MRSA infection nor to guide or monitor treatment for MRSA infections.   C difficile quick scan w PCR reflex     Status: None   Collection Time: 08/14/15 11:55 AM  Result Value Ref Range Status   C Diff antigen NEGATIVE NEGATIVE Final   C Diff toxin NEGATIVE NEGATIVE Final   C Diff interpretation Negative for toxigenic C. difficile  Final     Studies: Dg Chest 2  View  08/13/2015   CLINICAL DATA:  Leukocytosis, type II diabetes mellitus, unspecified essential hypertension, GERD, prostate cancer, coronary artery disease  EXAM: CHEST  2 VIEW  COMPARISON:  08/05/2015  FINDINGS: Enlargement of cardiac silhouette.  Calcified tortuous aorta.  Mediastinal contours and pulmonary vascularity normal.  Bibasilar atelectasis, mild in degree, little changed.  No definite infiltrate, pleural effusion or pneumothorax.  Scattered endplate spur formation thoracic spine.  IMPRESSION: Enlargement of cardiac silhouette with mild bibasilar atelectasis.  No new abnormalities.   Electronically Signed   By: Lavonia Dana M.D.   On: 08/13/2015 14:27    Scheduled Meds: . amLODipine  10 mg Oral Daily  . benazepril  20 mg Oral Daily  . enoxaparin (LOVENOX) injection  60 mg Subcutaneous Q24H  . hydrALAZINE  5 mg Intravenous Q6H  . insulin aspart  0-9 Units Subcutaneous Q4H  . lip balm  1 application Topical BID  . pantoprazole (PROTONIX) IV  40 mg Intravenous Q12H   Continuous Infusions: . lactated ringers 50 mL/hr at 08/13/15 1927    Principal Problem:   SBO (small bowel obstruction) Active Problems:   Dyslipidemia   Obstructive sleep apnea   Essential hypertension   CAD (coronary artery disease)   History  of prostate cancer   Diabetes mellitus type 2, controlled, without complications   Abdominal wall fluid collections   Nausea and vomiting   Gout   GERD (gastroesophageal reflux disease)   Morbid obesity   ARF (acute renal failure)    Time spent: 25 minutes    Kelvin Cellar  [facility] Pager [phone]. If 7PM-7AM, please contact night-coverage at [email or web address], password [facility] 08/14/2015, 4:34 PM  LOS: 9 days

## 2015-08-14 NOTE — Progress Notes (Signed)
Patient ID: JACODY BENEKE, male   DOB: 02-22-1946, 69 y.o.   MRN: [id number] 7 Days Post-Op  Subjective: Pt feeling well this morning.  Up in chair.  Tolerating a diet.  Having diarrhea and nausea  Objective: Vital signs in last 24 hours: Temp:  [98.1 F (36.7 C)-98.3 F (36.8 C)] 98.1 F (36.7 C) (09/29 8185) Pulse Rate:  [92-96] 92 (09/29 0608) Resp:  [20] 20 (09/29 0608) BP: (153-191)/(64-88) 156/71 mmHg (09/29 0608) SpO2:  [94 %-96 %] 95 % (09/29 6314) Weight:  [138.619 kg (305 lb 9.6 oz)] 138.619 kg (305 lb 9.6 oz) (09/29 0608) Last BM Date: 08/14/15  Intake/Output from previous day: 09/28 0701 - 09/29 0700 In: 2040 [P.O.:840; I.V.:1200] Out: 250 [Urine:250] Intake/Output this shift:    PE: Abd: soft, appropriately tender, +BS, ND, obese, incisions c/d/i Heart: regular  Lab Results:   Recent Labs  08/13/15 0525 08/14/15 0435  WBC 16.7* 19.5*  HGB 11.0* 11.4*  HCT 34.0* 34.8*  PLT 231 254   BMET  Recent Labs  08/13/15 0525 08/14/15 0435  NA 137 137  K 3.9 3.7  CL 105 105  CO2 24 23  GLUCOSE 119* 102*  BUN 14 16  CREATININE 1.30* 1.24  CALCIUM 8.5* 8.3*   PT/INR No results for input(s): LABPROT, INR in the last 72 hours. CMP     Component Value Date/Time   NA 137 08/14/2015 0435   NA 140 02/20/2015 1001   K 3.7 08/14/2015 0435   K 4.1 02/20/2015 1001   CL 105 08/14/2015 0435   CL 104 04/17/2013 0857   CO2 23 08/14/2015 0435   CO2 26 02/20/2015 1001   GLUCOSE 102* 08/14/2015 0435   GLUCOSE 98 02/20/2015 1001   GLUCOSE 105* 04/17/2013 0857   BUN 16 08/14/2015 0435   BUN 15.2 02/20/2015 1001   CREATININE 1.24 08/14/2015 0435   CREATININE 1.1 02/20/2015 1001   CREATININE 1.06 12/19/2014 0926   CALCIUM 8.3* 08/14/2015 0435   CALCIUM 9.2 02/20/2015 1001   PROT 6.9 08/06/2015 0400   PROT 7.4 02/20/2015 1001   ALBUMIN 3.3* 08/06/2015 0400   ALBUMIN 3.7 02/20/2015 1001   AST 16 08/06/2015 0400   AST 23 02/20/2015 1001   ALT 16* 08/06/2015  0400   ALT 28 02/20/2015 1001   ALKPHOS 55 08/06/2015 0400   ALKPHOS 81 02/20/2015 1001   BILITOT 0.7 08/06/2015 0400   BILITOT 0.55 02/20/2015 1001   GFRNONAA 58* 08/14/2015 0435   GFRAA >60 08/14/2015 0435   Lipase     Component Value Date/Time   LIPASE 11* 08/05/2015 0915       Studies/Results: Dg Chest 2 View  08/13/2015   CLINICAL DATA:  Leukocytosis, type II diabetes mellitus, unspecified essential hypertension, GERD, prostate cancer, coronary artery disease  EXAM: CHEST  2 VIEW  COMPARISON:  08/05/2015  FINDINGS: Enlargement of cardiac silhouette.  Calcified tortuous aorta.  Mediastinal contours and pulmonary vascularity normal.  Bibasilar atelectasis, mild in degree, little changed.  No definite infiltrate, pleural effusion or pneumothorax.  Scattered endplate spur formation thoracic spine.  IMPRESSION: Enlargement of cardiac silhouette with mild bibasilar atelectasis.  No new abnormalities.   Electronically Signed   By: Lavonia Dana M.D.   On: 08/13/2015 14:27    Anti-infectives: Anti-infectives    Start     Dose/Rate Route Frequency Ordered Stop   08/07/15 1302  vancomycin (VANCOCIN) 1,500 mg in sodium chloride 0.9 % 500 mL IVPB     1,500 mg  250 mL/hr over 120 Minutes Intravenous 60 min pre-op 08/07/15 1258 08/07/15 1355       Assessment/Plan   POD 7, s/p lap ventral hernia repair with mesh, Dr. Kieth Brightly -patient doing well.  Cont carb mod diet -Cont oral pain medications -pt's WBC up to 19K.  UA and CXR neg. With pt having diarrhea, I think there is a high likelihood he has C diff.  Will order this today.  If C diff neg, rec CT abd and pelvis to eval for possible occult abscess   LOS: 9 days    THOMAS, ALICIA C. 0/67/7034, 0:35 AM

## 2015-08-15 LAB — BASIC METABOLIC PANEL
ANION GAP: 7 (ref 5–15)
BUN: 11 mg/dL (ref 6–20)
CALCIUM: 8.1 mg/dL — AB (ref 8.9–10.3)
CO2: 25 mmol/L (ref 22–32)
Chloride: 104 mmol/L (ref 101–111)
Creatinine, Ser: 1.11 mg/dL (ref 0.61–1.24)
GLUCOSE: 102 mg/dL — AB (ref 65–99)
Potassium: 3.7 mmol/L (ref 3.5–5.1)
Sodium: 136 mmol/L (ref 135–145)

## 2015-08-15 LAB — GLUCOSE, CAPILLARY
GLUCOSE-CAPILLARY: 99 mg/dL (ref 65–99)
Glucose-Capillary: 113 mg/dL — ABNORMAL HIGH (ref 65–99)
Glucose-Capillary: 94 mg/dL (ref 65–99)

## 2015-08-15 LAB — CBC
HCT: 33.1 % — ABNORMAL LOW (ref 39.0–52.0)
Hemoglobin: 10.9 g/dL — ABNORMAL LOW (ref 13.0–17.0)
MCH: 30.2 pg (ref 26.0–34.0)
MCHC: 32.9 g/dL (ref 30.0–36.0)
MCV: 91.7 fL (ref 78.0–100.0)
PLATELETS: 274 10*3/uL (ref 150–400)
RBC: 3.61 MIL/uL — ABNORMAL LOW (ref 4.22–5.81)
RDW: 14.9 % (ref 11.5–15.5)
WBC: 15.4 10*3/uL — AB (ref 4.0–10.5)

## 2015-08-15 NOTE — Progress Notes (Signed)
Patient ID: LUNDEN MCLEISH, male   DOB: [birth year], 69 y.o.   MRN: [id number] 8 Days Post-Op  Subjective: Pt feels great.  Eager to go home.  Objective: Vital signs in last 24 hours: Temp:  [98.2 F (36.8 C)-98.6 F (37 C)] 98.6 F (37 C) (09/30 0500) Pulse Rate:  [83-97] 83 (09/30 0500) BP: (157-164)/(75-84) 162/75 mmHg (09/30 0500) SpO2:  [93 %-97 %] 97 % (09/30 0500) Weight:  [136.896 kg (301 lb 12.8 oz)] 136.896 kg (301 lb 12.8 oz) (09/30 0500) Last BM Date: 08/14/15  Intake/Output from previous day: 09/29 0701 - 09/30 0700 In: -  Out: 200 [Urine:200] Intake/Output this shift:    PE: Abd: soft, minimally tender, +BS, ND, obese, incisions c/d/i  Lab Results:   Recent Labs  08/14/15 0435 08/15/15 0440  WBC 19.5* 15.4*  HGB 11.4* 10.9*  HCT 34.8* 33.1*  PLT 254 274   BMET  Recent Labs  08/14/15 0435 08/15/15 0440  NA 137 136  K 3.7 3.7  CL 105 104  CO2 23 25  GLUCOSE 102* 102*  BUN 16 11  CREATININE 1.24 1.11  CALCIUM 8.3* 8.1*   PT/INR No results for input(s): LABPROT, INR in the last 72 hours. CMP     Component Value Date/Time   NA 136 08/15/2015 0440   NA 140 02/20/2015 1001   K 3.7 08/15/2015 0440   K 4.1 02/20/2015 1001   CL 104 08/15/2015 0440   CL 104 04/17/2013 0857   CO2 25 08/15/2015 0440   CO2 26 02/20/2015 1001   GLUCOSE 102* 08/15/2015 0440   GLUCOSE 98 02/20/2015 1001   GLUCOSE 105* 04/17/2013 0857   BUN 11 08/15/2015 0440   BUN 15.2 02/20/2015 1001   CREATININE 1.11 08/15/2015 0440   CREATININE 1.1 02/20/2015 1001   CREATININE 1.06 12/19/2014 0926   CALCIUM 8.1* 08/15/2015 0440   CALCIUM 9.2 02/20/2015 1001   PROT 6.9 08/06/2015 0400   PROT 7.4 02/20/2015 1001   ALBUMIN 3.3* 08/06/2015 0400   ALBUMIN 3.7 02/20/2015 1001   AST 16 08/06/2015 0400   AST 23 02/20/2015 1001   ALT 16* 08/06/2015 0400   ALT 28 02/20/2015 1001   ALKPHOS 55 08/06/2015 0400   ALKPHOS 81 02/20/2015 1001   BILITOT 0.7 08/06/2015 0400   BILITOT  0.55 02/20/2015 1001   GFRNONAA >60 08/15/2015 0440   GFRAA >60 08/15/2015 0440   Lipase     Component Value Date/Time   LIPASE 11* 08/05/2015 0915       Studies/Results: Dg Chest 2 View  08/13/2015   CLINICAL DATA:  Leukocytosis, type II diabetes mellitus, unspecified essential hypertension, GERD, prostate cancer, coronary artery disease  EXAM: CHEST  2 VIEW  COMPARISON:  08/05/2015  FINDINGS: Enlargement of cardiac silhouette.  Calcified tortuous aorta.  Mediastinal contours and pulmonary vascularity normal.  Bibasilar atelectasis, mild in degree, little changed.  No definite infiltrate, pleural effusion or pneumothorax.  Scattered endplate spur formation thoracic spine.  IMPRESSION: Enlargement of cardiac silhouette with mild bibasilar atelectasis.  No new abnormalities.   Electronically Signed   By: Lavonia Dana M.D.   On: 08/13/2015 14:27   Ct Abdomen Pelvis W Contrast  08/14/2015   CLINICAL DATA:  69 year old male with history of leukocytosis 1 week status post laparoscopic incisional hernia repair and placement of mesh.  EXAM: CT ABDOMEN AND PELVIS WITH CONTRAST  TECHNIQUE: Multidetector CT imaging of the abdomen and pelvis was performed using the standard protocol following bolus administration of  intravenous contrast.  CONTRAST:  169mL OMNIPAQUE IOHEXOL 300 MG/ML  SOLN  COMPARISON:  CT the abdomen and pelvis 08/06/2015.  FINDINGS: Lower chest: Subsegmental atelectasis in the left lower lobe. Small amount of anterior pericardial fluid and/or thickening, unlikely to be of any hemodynamic significance at this time. No associated pericardial calcification.  Hepatobiliary: Multiple well-defined low-attenuation lesions in the liver, the largest of which are compatible with simple cysts, measuring up to 2.2 cm in diameter in the central aspect of segment 8 of the liver. The smaller hepatic lesions are too small to definitively characterize, but are similar in size, number and pattern of  distribution compared to the prior examinations, likely small cysts. No intra or extrahepatic biliary ductal dilatation.  Pancreas: No pancreatic mass. No pancreatic ductal dilatation. No pancreatic or peripancreatic fluid or inflammatory changes.  Spleen: Subcapsular 1.3 cm low-attenuation lesion associated with the spleen, incompletely characterized on today's examination but similar to prior studies, likely a benign lesion such as a cyst.  Adrenals/Urinary Tract: Nodular thickening of the left adrenal gland is similar to the prior examination. Right adrenal gland is normal in appearance. Bilateral kidneys are normal in appearance. Mild bilateral perinephric stranding (nonspecific). No hydroureteronephrosis to indicate urinary tract obstruction at this time. Urinary bladder is normal in appearance.  Stomach/Bowel: The appearance of the stomach is normal. No pathologic dilatation of small bowel or colon. A few scattered colonic diverticulae are noted, without surrounding inflammatory changes to suggest an acute diverticulitis at this time. Normal appendix. 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  Vascular/Lymphatic: Atherosclerotic calcifications throughout the abdominal and pelvic vasculature. No aneurysm or dissection. No lymphadenopathy noted in the abdomen or pelvis.  Reproductive: Status post radical prostatectomy.  Other: Diffuse mesenteric edema. Trace volume of ascites. No pneumoperitoneum. Diffuse body wall edema.  Musculoskeletal: There are no aggressive appearing lytic or blastic lesions noted in the visualized portions of the skeleton.  IMPRESSION: 1. 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 2. Small-bowel obstruction noted on the prior study has resolved. 3. No definite source tree leukocytosis confidently identified on today's examination. 4. Mild diffuse mesenteric edema, trace volume of ascites, trace amount of pericardial fluid and/or thickening and diffuse body wall edema, suggesting a state of anasarca. 5. Additional incidental findings, as above, similar to the prior study.   Electronically Signed   By: Vinnie Langton M.D.   On: 08/14/2015 17:18    Anti-infectives: Anti-infectives    Start     Dose/Rate Route Frequency Ordered Stop   08/07/15 1302  vancomycin (VANCOCIN) 1,500 mg in sodium chloride 0.9 % 500 mL IVPB     1,500 mg 250 mL/hr over 120 Minutes Intravenous 60 min pre-op 08/07/15 1258 08/07/15 1355       Assessment/Plan   POD 8, s/p lap ventral hernia repair with mesh, Dr. Kieth Brightly -patient doing well. Cont carb mod diet -Cont oral pain medications -CT of abd/pel negative.  WBC down to 15K today.  All other labs and UA were negative.  Patient is stable for dc home from a surgical standpoint.  D/w  primary service. -follow up with Dr. Kieth Brightly in 3 weeks   LOS: 10 days    Marshall Roehrich E 08/15/2015, 8:07 AM Pager: [phone]

## 2015-08-15 NOTE — Progress Notes (Signed)
Pt discharged home with daughter in stable condition. Discharge instructions and scripts given. Pt verbalized understanding.  

## 2015-08-15 NOTE — Discharge Summary (Signed)
Physician Discharge Summary  Leonard [name] GBT:[id number] DOB: November 16, 1945 DOA: 08/05/2015  PCP: Leonard Showers, MD  Admit date: 08/05/2015 Discharge date: 08/15/2015  Time spent: 35 minutes  Recommendations for Outpatient Follow-up:  1. Please follow-up on repeat CBC on hospital follow-up visit, on 08/14/2015 his white count increased to 19,500 with workup not showing obvious source of infection. White count trended down to 15,400 on day of admission. Suspect reactive leukocytosis. 2. Please follow-up on his blood sugars, his Tradjenta was discontinued on discharge due to having blood sugar of 102 on the morning of discharge. Lady Gary may need to be restarted in the outpatient setting.  Discharge Diagnoses:  Principal Problem:   SBO (small bowel obstruction) Active Problems:   Dyslipidemia   Obstructive sleep apnea   Essential hypertension   CAD (coronary artery disease)   History of prostate cancer   Diabetes mellitus type 2, controlled, without complications   Abdominal wall fluid collections   Nausea and vomiting   Gout   GERD (gastroesophageal reflux disease)   Morbid obesity   ARF (acute renal failure)   Discharge Condition: Stable/improved  Diet recommendation: Carbohydrate modified diet  Filed Weights   08/13/15 0443 08/14/15 0608 08/15/15 0500  Weight: 137.077 kg (302 lb 3.2 oz) 138.619 kg (305 lb 9.6 oz) 136.896 kg (301 lb 12.8 oz)    History of present illness:  69 year old male with past medical history of prostate cancer status post prostatectomy, hypertension, gout, DM, dyslipidemia who presented to [facility] ED with main concern for ongoing abdominal pain, nausea and vomiting for past few weeks prior to this admission. Pain is mostly in epigastric area, discomfort feels like cramps and occasionally as sharp pain about 7/10 in intensity. After vomiting pain improves. No reports of blood in emesis. No diarrhea or constipation. No blood in stool. Pt has been seen in ED 9/19  at which time he had CT abdomen which demonstrated areas of fluid collection in perirectal area, posterior to urinary bladder which could represent tumor nodularity or infection. Dr. [name] of surgery has recommended IR evaluation and pt was going to be set up for aspiration per Dr. [name] of IR. Pt was subsequently discharged home. On this admission, he is hemodynamically stable. Abd x ray on admission showed SBO. He was admitted for management of SBO. His blood work was essentially unremarkable.   Hospital Course:  [name] is a pleasant 69 year old gentleman with a history of prostate cancer, hypertension, diabetes mellitus, dyslipidemia, admitted to the medicine service on 08/05/2015 when he presented with complaints of abdominal pain that was associated with nausea and vomiting. A recent CT scan done on 08/01/2015 showed nonspecific subcutaneous fluid collection in the right mid abdomen. CT scan of abdomen and pelvis was repeated on 08/06/2015 that reveals dilated loops of small bowel compatible with a small bowel obstruction. Radiology also reported an abdominal wall hernia along the right lateral abdominal wall with findings concerning for an incarcerated hernia leading to small bowel obstruction. General surgery was consulted, he was taken to the operating room on 08/07/2015 undergoing laparoscopic incisional hernia repair with placement of mesh. He tolerated procedure well. His postoperative course was complicated by ileus. This resolved with conservative management as his diet was slowly advanced.   Small bowel obstruction/incarcerated hernia -He was taken to the operating room on 08/07/2015 undergoing laparoscopic incisional hernia repair with placement of mesh. -Postoperative course complicated by ileus -Patient showing improvements over the last several days having flatus and bowel movements with  resolution to nausea and vomiting. -Lab work on 08/13/2015 revealed upward trend in his  white count of 13,200-16,700. He had a MAXIMUM TEMPERATURE of 100.1. However, on physical examination he was nonfocal. Will further workup with a pressures -On 08/14/2015 labs showing further increase in white count to 19,500. On exam he remains nontoxic appearing and nonfocal. -He complained of diarrhea making C. difficile colitis a possibility, however, stool for C. difficile toxin came back negative. -He was further worked up with CT scan of abdomen and pelvis with IV contrast that did not reveal obvious intra-abdominal source of infection.  2. Hypertension -Patient's systolic blood pressures elevated, having systolic blood pressures in the 150s to 160's -Please follow-up on blood pressures  3. Diabetes mellitus. -Blood sugars controlled -HisTradjenta was held on admission given nothing by mouth status. -Will monitor his blood sugars as his diet is being advanced. -Restarting ARB  4. History of coronary artery disease. -He does not appear to have active cardiac issues.   Procedures: Laparoscopic incisional hernia repair with placement of mesh, procedure performed on 08/07/2015  Consultations:  General surgery  Discharge Exam: Filed Vitals:   08/15/15 0500  BP: 162/75  Pulse: 83  Temp: 98.6 F (37 C)  Resp:      General: Patient is in no acute distress, awake and alert oriented, nonfocal on exam  Cardiovascular: Regular rate and rhythm normal S1-S2 no murmurs or gallops  Respiratory: Normal respiratory effort, lungs are clear to auscultation bilaterally  Abdomen: Abdomen, obese, soft, having mild tenderness over her surgical incision site otherwise no guarding or rebound tenderness  Musculoskeletal: No extremity edema  Discharge Instructions   Discharge Instructions    Call MD for:  difficulty breathing, headache or visual disturbances    Complete by:  As directed      Call MD for:  extreme fatigue    Complete by:  As directed      Call MD for:  hives     Complete by:  As directed      Call MD for:  persistant dizziness or light-headedness    Complete by:  As directed      Call MD for:  persistant nausea and vomiting    Complete by:  As directed      Call MD for:  redness, tenderness, or signs of infection (pain, swelling, redness, odor or green/yellow discharge around incision site)    Complete by:  As directed      Call MD for:  severe uncontrolled pain    Complete by:  As directed      Call MD for:  temperature >100.4    Complete by:  As directed      Call MD for:    Complete by:  As directed      Diet - low sodium heart healthy    Complete by:  As directed      Increase activity slowly    Complete by:  As directed           Current Discharge Medication List    CONTINUE these medications which have CHANGED   Details  oxyCODONE-acetaminophen (PERCOCET/ROXICET) 5-325 MG tablet Take 1-2 tablets by mouth every 4 (four) hours as needed for moderate pain. Qty: 40 tablet, Refills: 0      CONTINUE these medications which have NOT CHANGED   Details  allopurinol (ZYLOPRIM) 300 MG tablet TAKE 1 TABLET BY MOUTH DAILY TO PREVENT GOUT Qty: 90 tablet, Refills: 3    amLODipine-benazepril (LOTREL)  10-20 MG per capsule Take 1 capsule by mouth every morning. Qty: 90 capsule, Refills: 3    aspirin 81 MG tablet Take 81 mg by mouth daily.    atorvastatin (LIPITOR) 10 MG tablet TAKE 1 TABLET BY MOUTH EVERY DAY Qty: 90 tablet, Refills: 3    esomeprazole (NEXIUM) 40 MG capsule TAKE ONE CAPSULE BY MOUTH DAILY Qty: 90 capsule, Refills: 3    FLUZONE HIGH-DOSE 0.5 ML SUSY inject 0.5 milliliter intramuscularly Refills: 0    nitroGLYCERIN (NITROSTAT) 0.4 MG SL tablet Place 0.4 mg under the tongue every 5 (five) minutes as needed for chest pain.    ondansetron (ZOFRAN ODT) 4 MG disintegrating tablet Take 1 tablet (4 mg total) by mouth every 8 (eight) hours as needed for nausea or vomiting. Qty: 20 tablet, Refills: 0     triamterene-hydrochlorothiazide (MAXZIDE-25) 37.5-25 MG per tablet Take 1 tablet by mouth daily. Qty: 15 tablet, Refills: 11   Associated Diagnoses: Essential hypertension; Hyperlipemia; Type 2 diabetes mellitus with complication      STOP taking these medications     linagliptin (TRADJENTA) 5 MG TABS tablet      traMADol (ULTRAM) 50 MG tablet        Allergies  Allergen Reactions  . Dicloxacillin Rash  . Cayenne Pepper [Cayenne] Swelling  . Nsaids     Acute renal failure 08/09/2015   Follow-up Information    Follow up with Arta Bruce Kinsinger, MD. Schedule an appointment as soon as possible for a visit in 3 weeks.   Specialty:  General Surgery   Contact information:   [address] [place] [address] [phone]       Follow up with Leonard Showers, MD In 1 week.   Specialty:  Internal Medicine   Contact information:   [address] [id number] [phone]        The results of significant diagnostics from this hospitalization (including imaging, microbiology, ancillary and laboratory) are listed below for reference.    Significant Diagnostic Studies: Dg Chest 2 View  08/13/2015   CLINICAL DATA:  Leukocytosis, type II diabetes mellitus, unspecified essential hypertension, GERD, prostate cancer, coronary artery disease  EXAM: CHEST  2 VIEW  COMPARISON:  08/05/2015  FINDINGS: Enlargement of cardiac silhouette.  Calcified tortuous aorta.  Mediastinal contours and pulmonary vascularity normal.  Bibasilar atelectasis, mild in degree, little changed.  No definite infiltrate, pleural effusion or pneumothorax.  Scattered endplate spur formation thoracic spine.  IMPRESSION: Enlargement of cardiac silhouette with mild bibasilar atelectasis.  No new abnormalities.   Electronically Signed   By: Lavonia Dana M.D.   On: 08/13/2015 14:27   Ct Abdomen Pelvis W Contrast  08/14/2015   CLINICAL DATA:  69 year old male with history of leukocytosis 1 week  status post laparoscopic incisional hernia repair and placement of mesh.  EXAM: CT ABDOMEN AND PELVIS WITH CONTRAST  TECHNIQUE: Multidetector CT imaging of the abdomen and pelvis was performed using the standard protocol following bolus administration of intravenous contrast.  CONTRAST:  195mL OMNIPAQUE IOHEXOL 300 MG/ML  SOLN  COMPARISON:  CT the abdomen and pelvis 08/06/2015.  FINDINGS: Lower chest: Subsegmental atelectasis in the left lower lobe. Small amount of anterior pericardial fluid and/or thickening, unlikely to be of any hemodynamic significance at this time. No associated pericardial calcification.  Hepatobiliary: Multiple well-defined low-attenuation lesions in the liver, the largest of which are compatible with simple cysts, measuring up to 2.2 cm in diameter in the central aspect of segment  8 of the liver. The smaller hepatic lesions are too small to definitively characterize, but are similar in size, number and pattern of distribution compared to the prior examinations, likely small cysts. No intra or extrahepatic biliary ductal dilatation.  Pancreas: No pancreatic mass. No pancreatic ductal dilatation. No pancreatic or peripancreatic fluid or inflammatory changes.  Spleen: Subcapsular 1.3 cm low-attenuation lesion associated with the spleen, incompletely characterized on today's examination but similar to prior studies, likely a benign lesion such as a cyst.  Adrenals/Urinary Tract: Nodular thickening of the left adrenal gland is similar to the prior examination. Right adrenal gland is normal in appearance. Bilateral kidneys are normal in appearance. Mild bilateral perinephric stranding (nonspecific). No hydroureteronephrosis to indicate urinary tract obstruction at this time. Urinary bladder is normal in appearance.  Stomach/Bowel: The appearance of the stomach is normal. No pathologic dilatation of small bowel or colon. A few scattered colonic diverticulae are noted, without surrounding  inflammatory changes to suggest an acute diverticulitis at this time. Normal appendix. 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  Vascular/Lymphatic: Atherosclerotic calcifications throughout the abdominal and pelvic vasculature. No aneurysm or dissection. No lymphadenopathy noted in the abdomen or pelvis.  Reproductive: Status post radical prostatectomy.  Other: Diffuse mesenteric edema. Trace volume of ascites. No pneumoperitoneum. Diffuse body wall edema.  Musculoskeletal: There are no aggressive appearing lytic or blastic lesions noted in the visualized portions of the skeleton.  IMPRESSION: 1. Previously noted Spigelian hernia appears to have been closed, and there is a small postoperative fluid collection superficial to the site of hernia repair in the right anterior abdominal wall, likely to represent a postoperative seroma. 2. Small-bowel obstruction noted on the prior study has resolved. 3. No definite source tree leukocytosis confidently identified on today's examination. 4. Mild diffuse mesenteric edema, trace volume of ascites, trace amount of pericardial fluid and/or thickening and diffuse body wall edema, suggesting a state of anasarca. 5. Additional incidental findings, as above, similar to the prior study.   Electronically Signed   By: Vinnie Langton M.D.   On: 08/14/2015 17:18   Ct Abdomen Pelvis W Contrast  08/06/2015   ADDENDUM REPORT: 08/06/2015 16:13 ADDENDUM: These results were called by telephone at the time of interpretation on 08/06/2015 at 4:12 pm to Dr. Gurney Maxin, who verbally acknowledged these results. Electronically Signed   By: Markus Daft M.D.   On: 08/06/2015 16:13  08/06/2015   CLINICAL DATA:  Evaluate abdominal  wall fluid collections. History of prostate cancer.  EXAM: CT ABDOMEN AND PELVIS WITH CONTRAST  TECHNIQUE: Multidetector CT imaging of the abdomen and pelvis was performed using the standard protocol following bolus administration of intravenous contrast.  CONTRAST:  141mL OMNIPAQUE IOHEXOL 300 MG/ML  SOLN  COMPARISON:  08/01/2015 and abdominal radiographs 08/05/2015  FINDINGS: Increased volume loss at the medial left lung base with trace pleural fluid. CT tomogram images demonstrate dilated loops of small bowel throughout the abdomen which are similar to the examination on 08/05/2015.  Negative for free intraperitoneal air.  Nasogastric tube extends into the stomach body. Mild dilatation of the distal esophagus.  Again noted are numerous hypodensities throughout the liver which most likely represent cysts, largest measuring 2.2 cm. Normal appearance of the gallbladder and portal venous system  is patent. No acute abnormality to the pancreas or spleen. Again noted is a small exophytic hypodensity along the superior aspect of spleen measuring up to 1.6 cm and may contain fat. No acute abnormality involving the kidneys and no evidence for hydronephrosis. Stable left adrenal nodules may represent adenomas based their low density.  Atherosclerotic calcifications in the distal abdominal aorta without aneurysm. No significant abdominal or pelvic lymphadenopathy.  Prior prostatectomy. Small amount of fluid at the pelvis has decreased or resolved. There is oral contrast throughout the colon. The colon is decompressed. There is oral contrast in the appendix. There are dilated loops of small bowel and the previously described subcutaneous fluid collection in the right lower abdomen represents an abdominal wall hernia containing small bowel loops. Dilated small bowel loops measure up to 3.3 cm. There is concern for mild inflammatory changes in the right lower abdomen surrounding a loop of bowel on sequence 2, image 61. There  may be mesenteric edema around this loop of small bowel.  Extensive facet arthropathy at L5-S1. Mild disc space narrowing and vacuum disc phenomena at L5-S1.  IMPRESSION: Persistent dilated loops of small bowel compatible with a small bowel obstruction. 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 Findings are concerning for an incarcerated hernia causing the small bowel obstruction. Mild inflammation or mesenteric edema involving a small bowel loop in the right lower abdomen.  Volume loss at the left lung base with trace left pleural fluid.  Stable left adrenal nodules may represent adenomas.  Exophytic splenic lesion with fat attenuation. This could represent a small subcapsular hamartoma.  Probable hepatic cysts.  Electronically Signed: By: Markus Daft M.D. On: 08/06/2015 16:08   Ct Abdomen Pelvis W Contrast  08/01/2015   CLINICAL DATA:  Epigastric and abdominal pain intermittently for 1.5 months, episodes last few hours, type II diabetes mellitus, essential hypertension, coronary artery disease, prostate cancer post prostatectomy, hiatal hernia  EXAM: CT ABDOMEN AND PELVIS WITH CONTRAST  TECHNIQUE: Multidetector CT imaging of the abdomen and pelvis was performed using the standard protocol following bolus administration of intravenous contrast. Sagittal and coronal MPR images reconstructed from axial data set.  CONTRAST:  158mL OMNIPAQUE IOHEXOL 300 MG/ML SOLN IV. Dilute oral contrast.  COMPARISON:  02/22/2014 CT pelvis  FINDINGS: Minimal dependent atelectasis in the lower lobes.  Multiple low-attenuation foci within liver up to 2.2 cm diameter questions cysts.  Remainder of liver, spleen, pancreas, gallbladder, kidneys, and RIGHT adrenal gland normal.  Low-attenuation LEFT adrenal nodules consistent with adenomas, 1.6 x 1.4 cm image 31 and 20 x 11 mm image 28.  Normal appendix and gallbladder.  Small subcutaneous collection identified  external to the fascia in RIGHT mid abdomen the level of the iliac crests, 4.5 x 1.5 x 3.6 cm in size, uncertain etiology.  No definite fashion old defect to suggest hernia seen.  This could be related to infection, prior surgery or trauma and could be a sterile or infected subcutaneous collection.  Interval prostatectomy with small fluid collection adjacent to rectum 2 cm diameter question perirectal fluid versus within a peritoneal recess.  Low position of the decompressed urinary bladder.  Questionable area of tumor nodularity versus postsurgical change anterolateral to the small perirectal fluid collection, 17 x 10 x 18 mm.  Gastric wall appears slightly prominent though stomach is underdistended.  Large and small bowel loops unremarkable.  Minimal scattered atherosclerotic calcification.  No additional mass, adenopathy, free air or free  fluid.  No acute osseous findings.  IMPRESSION: Interval prostatectomy with low position of urinary bladder.  Small perirectal fluid collection 2 cm diameter versus a small amount of pelvic ascites.  17 x 10 x 18 mm area of density posterior to the urinary bladder, RIGHT anterolateral to the perirectal fluid collection, question postsurgical change versus tumor nodularity.  Multiple small low-attenuation hepatic lesions likely representing numerous cysts.  LEFT adrenal adenomas.  Nonspecific subcutaneous fluid collection RIGHT mid abdomen 4.5 x 1.5 x 3.6 cm, question postsurgical, related to prior trauma or prior infection.   Electronically Signed   By: Lavonia Dana M.D.   On: 08/01/2015 11:43   Dg Abd Acute W/chest  08/05/2015   CLINICAL DATA:  Nausea and vomiting for 2-3 days.  EXAM: DG ABDOMEN ACUTE W/ 1V CHEST  COMPARISON:  April 24, 2014  FINDINGS: There is mild atelectasis of the right lung base. The heart size is mildly enlarged. The aorta is tortuous. There is no pleural effusion. There are multiple dilated small bowel loops in the abdomen. Residual contrast is  identified in the colon. There is scoliosis of spine.  IMPRESSION: Multiple dilated small bowel loops. The findings are consistent with early small bowel obstruction or partial small bowel obstruction.  Mild atelectasis of right lung base.   Electronically Signed   By: [name] M.D.   On: 08/05/2015 12:25   Dg Loyce Dys Tube Plc W/fl W/rad  08/06/2015   CLINICAL DATA:  69 year old male with small bowel obstruction in need of nasogastric tube placement.  EXAM: NASO G TUBE PLACEMENT WITH FL AND WITH RAD  CONTRAST:  None  FLUOROSCOPY TIME:  If the device does not provide the exposure index:  Fluoroscopy Time (in minutes and seconds):  5 minutes and 2 seconds  Number of Acquired Images:  1  COMPARISON:  No priors.  FINDINGS: Fluoroscopic guidance was utilized to pass a feeding tube into the stomach over a guidewire. Proper placement of the tube was confirmed with fluoroscopy. No complications.  IMPRESSION: 1. Successful fluoroscopic guided placement of feeding tube into the body of the stomach.   Electronically Signed   By: Vinnie Langton M.D.   On: 08/06/2015 13:17    Microbiology: Recent Results (from the past 240 hour(s))  MRSA PCR Screening     Status: None   Collection Time: 08/07/15  8:07 AM  Result Value Ref Range Status   MRSA by PCR NEGATIVE NEGATIVE Final    Comment:        The GeneXpert MRSA Assay (FDA approved for NASAL specimens only), is one component of a comprehensive MRSA colonization surveillance program. It is not intended to diagnose MRSA infection nor to guide or monitor treatment for MRSA infections.   C difficile quick scan w PCR reflex     Status: None   Collection Time: 08/14/15 11:55 AM  Result Value Ref Range Status   C Diff antigen NEGATIVE NEGATIVE Final   C Diff toxin NEGATIVE NEGATIVE Final   C Diff interpretation Negative for toxigenic C. difficile  Final     Labs: Basic Metabolic Panel:  Recent Labs Lab 08/09/15 0345 08/11/15 0416 08/13/15 0525  08/14/15 0435 08/15/15 0440  NA 135 135 137 137 136  K 3.8 4.2 3.9 3.7 3.7  CL 103 105 105 105 104  CO2 23 22 24 23 25   GLUCOSE 143* 111* 119* 102* 102*  BUN 17 18 14 16 11   CREATININE 1.96* 1.32* 1.30* 1.24 1.11  CALCIUM  8.5* 8.3* 8.5* 8.3* 8.1*  MG 2.3  --  2.0  --   --    Liver Function Tests: No results for input(s): AST, ALT, ALKPHOS, BILITOT, PROT, ALBUMIN in the last 168 hours. No results for input(s): LIPASE, AMYLASE in the last 168 hours. No results for input(s): AMMONIA in the last 168 hours. CBC:  Recent Labs Lab 08/09/15 0345 08/11/15 0416 08/13/15 0525 08/14/15 0435 08/15/15 0440  WBC 13.6* 13.2* 16.7* 19.5* 15.4*  HGB 11.7* 7.1* 11.0* 11.4* 10.9*  HCT 36.1* 31.0* 34.0* 34.8* 33.1*  MCV 93.3 93.7 91.4 90.6 91.7  PLT 285 212 231 254 274   Cardiac Enzymes: No results for input(s): CKTOTAL, CKMB, CKMBINDEX, TROPONINI in the last 168 hours. BNP: BNP (last 3 results) No results for input(s): BNP in the last 8760 hours.  ProBNP (last 3 results) No results for input(s): PROBNP in the last 8760 hours.  CBG:  Recent Labs Lab 08/14/15 1712 08/14/15 2008 08/15/15 0032 08/15/15 0429 08/15/15 0748  GLUCAP 83 131* 113* 99 94       Signed:  ZAMORA, EZEQUIEL  [facility] 08/15/2015, 8:06 AM

## 2015-08-21 ENCOUNTER — Other Ambulatory Visit (HOSPITAL_BASED_OUTPATIENT_CLINIC_OR_DEPARTMENT_OTHER): Payer: No Typology Code available for payment source

## 2015-08-21 DIAGNOSIS — C61 Malignant neoplasm of prostate: Secondary | ICD-10-CM

## 2015-08-21 DIAGNOSIS — D509 Iron deficiency anemia, unspecified: Secondary | ICD-10-CM | POA: Diagnosis not present

## 2015-08-21 DIAGNOSIS — D472 Monoclonal gammopathy: Secondary | ICD-10-CM

## 2015-08-21 LAB — FERRITIN CHCC: Ferritin: 176 ng/ml (ref 22–316)

## 2015-08-21 LAB — CBC WITH DIFFERENTIAL/PLATELET
BASO%: 0.3 % (ref 0.0–2.0)
Basophils Absolute: 0 10*3/uL (ref 0.0–0.1)
EOS ABS: 0.1 10*3/uL (ref 0.0–0.5)
EOS%: 1.4 % (ref 0.0–7.0)
HEMATOCRIT: 34.6 % — AB (ref 38.4–49.9)
HGB: 11.2 g/dL — ABNORMAL LOW (ref 13.0–17.1)
LYMPH#: 2.3 10*3/uL (ref 0.9–3.3)
LYMPH%: 30.4 % (ref 14.0–49.0)
MCH: 30 pg (ref 27.2–33.4)
MCHC: 32.4 g/dL (ref 32.0–36.0)
MCV: 92.8 fL (ref 79.3–98.0)
MONO#: 0.6 10*3/uL (ref 0.1–0.9)
MONO%: 8.6 % (ref 0.0–14.0)
NEUT%: 59.3 % (ref 39.0–75.0)
NEUTROS ABS: 4.4 10*3/uL (ref 1.5–6.5)
PLATELETS: 277 10*3/uL (ref 140–400)
RBC: 3.73 10*6/uL — ABNORMAL LOW (ref 4.20–5.82)
RDW: 15.2 % — ABNORMAL HIGH (ref 11.0–14.6)
WBC: 7.4 10*3/uL (ref 4.0–10.3)

## 2015-08-21 LAB — IRON AND TIBC CHCC
%SAT: 12 % — ABNORMAL LOW (ref 20–55)
Iron: 36 ug/dL — ABNORMAL LOW (ref 42–163)
TIBC: 294 ug/dL (ref 202–409)
UIBC: 258 ug/dL (ref 117–376)

## 2015-08-28 ENCOUNTER — Telehealth: Payer: Self-pay | Admitting: Internal Medicine

## 2015-08-28 ENCOUNTER — Encounter: Payer: Self-pay | Admitting: Internal Medicine

## 2015-08-28 ENCOUNTER — Ambulatory Visit (HOSPITAL_BASED_OUTPATIENT_CLINIC_OR_DEPARTMENT_OTHER): Payer: No Typology Code available for payment source | Admitting: Internal Medicine

## 2015-08-28 VITALS — BP 156/76 | HR 75 | Temp 97.9°F | Resp 18 | Ht 69.25 in | Wt 273.4 lb

## 2015-08-28 DIAGNOSIS — D509 Iron deficiency anemia, unspecified: Secondary | ICD-10-CM | POA: Insufficient documentation

## 2015-08-28 DIAGNOSIS — C61 Malignant neoplasm of prostate: Secondary | ICD-10-CM

## 2015-08-28 DIAGNOSIS — D472 Monoclonal gammopathy: Secondary | ICD-10-CM | POA: Diagnosis not present

## 2015-08-28 NOTE — Telephone Encounter (Signed)
Gave and printed appt sched and avs for pt for April 2017 °

## 2015-08-28 NOTE — Progress Notes (Signed)
[facility] [phone]   [phone]  OFFICE PROGRESS NOTE  [name], MD [address] [place] [address]  DIAGNOSIS:  1) monoclonal gammopathy of undetermined significance.  2) iron deficiency anemia.  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 Tresa Moore.   PRIOR THERAPY: Feraheme infusion x 2 doses, last dose was given in October of 2014.  CURRENT THERAPY: Integra plus 1 capsule by mouth daily.  INTERVAL HISTORY: Leonard [name] 69 y.o. male returns to the clinic today for 6 months follow up visit. He is feeling very well today with no significant fatigue or weakness. He recently underwent laparoscopic incisional hernia repair with mesh and treatment for small bowel obstruction on 08/07/2015. He is feeling much better and recovering well. He denied having any bleeding issues he specifically no rectal bleeding. The patient denied having any significant nausea or vomiting. He denied having any significant chest pain, shortness of breath, cough or hemoptysis. He has a recent lab work including CBC, iron study and ferritin and he is here for evaluation and discussion of his lab results.  MEDICAL HISTORY: Past Medical History  Diagnosis Date  . Type II or unspecified type diabetes mellitus without mention of complication, not stated as uncontrolled   . Gouty arthropathy   . Other and unspecified hyperlipidemia   . Unspecified essential hypertension   . OSA (obstructive sleep apnea)     USES CPAP MACHINE   . Diverticulosis of colon (without mention of hemorrhage) 09/05/2006    Colonoscopy   . Hx of colonic polyps 09/05/2006    Colonoscopy(ADENOMATOUS POLYP)  . Hiatal hernia 09/05/2006    EGD  . 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 GERD  (gastroesophageal reflux disease) 07/26/2001    EGD(Chronic)  . Hemorrhoids   . Coronary artery disease   . Prostate cancer ([place]) 2015  . Iron deficiency anemia, unspecified   . Gastric polyp 2014    done at [facility]:  is allergic to dicloxacillin; cayenne pepper; and nsaids.  MEDICATIONS:  Current Outpatient Prescriptions  Medication Sig Dispense Refill  . allopurinol (ZYLOPRIM) 300 MG tablet TAKE 1 TABLET BY MOUTH DAILY TO PREVENT GOUT 90 tablet 3  . amLODipine-benazepril (LOTREL) 10-20 MG per capsule Take 1 capsule by mouth every morning. 90 capsule 3  . aspirin 81 MG tablet Take 81 mg by mouth daily.    Marland Kitchen atorvastatin (LIPITOR) 10 MG tablet TAKE 1 TABLET BY MOUTH EVERY DAY 90 tablet 3  . esomeprazole (NEXIUM) 40 MG capsule TAKE ONE CAPSULE BY MOUTH DAILY 90 capsule 3  . FLUZONE HIGH-DOSE 0.5 ML SUSY inject 0.5 milliliter intramuscularly  0  . nitroGLYCERIN (NITROSTAT) 0.4 MG SL tablet Place 0.4 mg under the tongue every 5 (five) minutes as needed for chest pain.    Marland Kitchen ondansetron (ZOFRAN ODT) 4 MG disintegrating tablet Take 1 tablet (4 mg total) by mouth every 8 (eight) hours as needed for nausea or vomiting. 20 tablet 0  . oxyCODONE-acetaminophen (PERCOCET/ROXICET) 5-325 MG tablet Take 1-2 tablets by mouth every 4 (four) hours as needed for moderate pain. 40 tablet 0  . triamterene-hydrochlorothiazide (MAXZIDE-25) 37.5-25 MG per tablet Take 1 tablet by mouth daily. 15 tablet 11  . TRADJENTA 5 MG TABS tablet     . traMADol (ULTRAM) 50  MG tablet Take 50 mg by mouth every 6 (six) hours as needed.  0   No current facility-administered medications for this visit.    SURGICAL HISTORY:  Past Surgical History  Procedure Laterality Date  . 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 Hemorrhoid surgery    . Robot assisted laparoscopic radical prostatectomy N/A 05/01/2014    Procedure: ROBOTIC ASSISTED LAPAROSCOPIC RADICAL PROSTATECTOMY WITH  INDOCYANINE GREEN DYE;  Surgeon: Alexis Frock, MD;  Location: [facility];  Service: Urology;  Laterality: N/A;  . Lymphadenectomy Bilateral 05/01/2014    Procedure: LYMPHADENECTOMY;  Surgeon: Alexis Frock, MD;  Location: [facility];  Service: Urology;  Laterality: Bilateral;  . Esophagogastroduodenoscopy      Done at [facility]  . Gi prostate biopsy    . Incisional hernia repair N/A 08/07/2015    Procedure: LAPAROSCOPIC INCISIONAL HERNIA repair with mesh;  Surgeon: Arta Bruce Kinsinger, MD;  Location: [facility];  Service: General;  Laterality: N/A;    REVIEW OF SYSTEMS:  A comprehensive review of systems was negative.   PHYSICAL EXAMINATION: General appearance: alert, cooperative, fatigued and no distress Head: Normocephalic, without obvious abnormality, atraumatic Neck: no adenopathy and thyroid not enlarged, symmetric, no tenderness/mass/nodules Lymph nodes: Cervical, supraclavicular, and axillary nodes normal. Resp: clear to auscultation bilaterally Back: symmetric, no curvature. ROM normal. No CVA tenderness. Cardio: regular rate and rhythm, S1, S2 normal, no murmur, click, rub or gallop GI: soft, non-tender; bowel sounds normal; no masses,  no organomegaly Extremities: extremities normal, atraumatic, no cyanosis or edema Neurologic: Alert and oriented X 3, normal strength and tone. Normal symmetric reflexes. Normal coordination and gait  ECOG PERFORMANCE STATUS: 1 - Symptomatic but completely ambulatory  Blood pressure 156/76, pulse 75, temperature 97.9 F (36.6 C), temperature source Oral, resp. rate 18, height 5' 9.25" (1.759 m), weight 273 lb 6.4 oz (124.013 kg), SpO2 99 %.  LABORATORY DATA: Lab Results  Component Value Date   WBC 7.4 08/21/2015   HGB 11.2* 08/21/2015   HCT 34.6* 08/21/2015   MCV 92.8 08/21/2015   PLT 277 08/21/2015      Chemistry      Component Value Date/Time   NA 136 08/15/2015 0440   NA 140 02/20/2015 1001   K 3.7 08/15/2015 0440   K 4.1 02/20/2015 1001   CL  104 08/15/2015 0440   CL 104 04/17/2013 0857   CO2 25 08/15/2015 0440   CO2 26 02/20/2015 1001   BUN 11 08/15/2015 0440   BUN 15.2 02/20/2015 1001   CREATININE 1.11 08/15/2015 0440   CREATININE 1.1 02/20/2015 1001   CREATININE 1.06 12/19/2014 0926      Component Value Date/Time   CALCIUM 8.1* 08/15/2015 0440   CALCIUM 9.2 02/20/2015 1001   ALKPHOS 55 08/06/2015 0400   ALKPHOS 81 02/20/2015 1001   AST 16 08/06/2015 0400   AST 23 02/20/2015 1001   ALT 16* 08/06/2015 0400   ALT 28 02/20/2015 1001   BILITOT 0.7 08/06/2015 0400   BILITOT 0.55 02/20/2015 1001     Other lab studies: ferritin 176, serum iron 36, total iron-binding capacity 294 and iron saturation 12%.   RADIOGRAPHIC STUDIES: No results found.  ASSESSMENT AND PLAN:  1) iron deficiency anemia most likely secondary to history of GI blood loss. He has a stable hemoglobin and hematocrit but the iron study showed evidence for iron deficiency with low serum iron and iron saturation. I recommended for the patient to start oral  iron tablets with Integra plus1 capsule by mouth daily.  2) Monoclonal gammopathy of undetermined significance: no significant evidence for disease progression. The patient will continue on observation for now.  3) prostate adenocarcinoma status post radical prostatectomy under the care of Dr. Tresa Moore. He will continue to followup with his urologist. He would come back for follow up visit in 6 months with repeat CBC, iron study and ferritin in addition to myeloma panel. The patient was advised to call immediately if he has any concerning symptoms in the interval.  The patient voices understanding of current disease status and treatment options and is in agreement with the current care plan.  All questions were answered. The patient knows to call the clinic with any problems, questions or concerns. We can certainly see the patient much sooner if necessary.  Disclaimer: This note was dictated with voice  recognition software. Similar sounding words can inadvertently be transcribed and may not be corrected upon review.

## 2015-08-29 MED ORDER — INTEGRA PLUS PO CAPS: 1.0000 | ORAL_CAPSULE | Freq: Every morning | ORAL | Status: DC

## 2015-09-15 ENCOUNTER — Other Ambulatory Visit: Payer: Self-pay | Admitting: *Deleted

## 2015-09-15 DIAGNOSIS — I10 Essential (primary) hypertension: Secondary | ICD-10-CM

## 2015-09-15 DIAGNOSIS — E785 Hyperlipidemia, unspecified: Secondary | ICD-10-CM

## 2015-09-15 MED ORDER — TRIAMTERENE-HCTZ 37.5-25 MG PO TABS: ORAL_TABLET | ORAL | Status: DC

## 2015-10-22 ENCOUNTER — Other Ambulatory Visit: Payer: Self-pay | Admitting: Internal Medicine

## 2015-12-18 ENCOUNTER — Ambulatory Visit: Payer: No Typology Code available for payment source | Admitting: Adult Health

## 2015-12-22 ENCOUNTER — Ambulatory Visit (INDEPENDENT_AMBULATORY_CARE_PROVIDER_SITE_OTHER): Payer: Medicare Other | Admitting: Adult Health

## 2015-12-22 ENCOUNTER — Encounter: Payer: Self-pay | Admitting: Adult Health

## 2015-12-22 VITALS — BP 128/64 | HR 77 | Temp 97.8°F | Ht 69.0 in | Wt 279.0 lb

## 2015-12-22 DIAGNOSIS — G4733 Obstructive sleep apnea (adult) (pediatric): Secondary | ICD-10-CM

## 2015-12-22 NOTE — Progress Notes (Signed)
Reviewed and agree with assessment/plan. 

## 2015-12-22 NOTE — Progress Notes (Signed)
Subjective:    Patient ID: Leonard [name], male    DOB: April 16, 1946, 70 y.o.   MRN: [id number]  HPI 70 yo male with Moderate OSA  Former pt of Dr. Gwenette Greet   TEST  NPSG 2008 with AHI 29/hr    12/22/2015 Follow up : OSA  Pt returns for 1 year follow up  Uses CPAP on average 6-8 hours night, mask fits okay.  Would like to discuss new machine and supplies.  Says machine is not working, not able to wear it some nights.  Has had CPAP machine for few years.  Has to place hand on machine while sleeping to keep it working.  We discussed will need  To have repaired or new machine, order sent to DME.  Pt denies chest pain, orthopnea, edema or fever.      Past Medical History  Diagnosis Date  . Type II or unspecified type diabetes mellitus without mention of complication, not stated as uncontrolled   . Gouty arthropathy   . Other and unspecified hyperlipidemia   . Unspecified essential hypertension   . OSA (obstructive sleep apnea)     USES CPAP MACHINE   . Diverticulosis of colon (without mention of hemorrhage) 09/05/2006    Colonoscopy   . Hx of colonic polyps 09/05/2006    Colonoscopy(ADENOMATOUS POLYP)  . Hiatal hernia 09/05/2006    EGD  . 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 GERD (gastroesophageal reflux disease) 07/26/2001    EGD(Chronic)  . Hemorrhoids   . Coronary artery disease   . Prostate cancer ([facility]) 2015  . Iron deficiency anemia, unspecified   . Gastric polyp 2014    done at [facility]   Current Outpatient Prescriptions on File Prior to Visit  Medication Sig Dispense Refill  . allopurinol (ZYLOPRIM) 300 MG tablet TAKE 1 TABLET BY MOUTH DAILY TO PREVENT GOUT 90 tablet 3  . amLODipine-benazepril (LOTREL) 10-20 MG capsule TAKE 1 CAPSULE BY MOUTH EVERY DAY 90 capsule 3  . aspirin 81 MG tablet Take 81 mg by mouth daily.    Marland Kitchen atorvastatin (LIPITOR) 10 MG tablet TAKE 1 TABLET BY MOUTH EVERY DAY 90 tablet 3  . esomeprazole (NEXIUM) 40 MG capsule  TAKE ONE CAPSULE BY MOUTH DAILY 90 capsule 3  . FeFum-FePoly-FA-B Cmp-C-Biot (INTEGRA PLUS) CAPS Take 1 capsule by mouth every morning. 30 capsule 5  . nitroGLYCERIN (NITROSTAT) 0.4 MG SL tablet Place 0.4 mg under the tongue every 5 (five) minutes as needed for chest pain.    Marland Kitchen ondansetron (ZOFRAN ODT) 4 MG disintegrating tablet Take 1 tablet (4 mg total) by mouth every 8 (eight) hours as needed for nausea or vomiting. 20 tablet 0  . oxyCODONE-acetaminophen (PERCOCET/ROXICET) 5-325 MG tablet Take 1-2 tablets by mouth every 4 (four) hours as needed for moderate pain. 40 tablet 0  . TRADJENTA 5 MG TABS tablet Take 5 mg by mouth daily.     . traMADol (ULTRAM) 50 MG tablet Take 50 mg by mouth every 6 (six) hours as needed.  0  . triamterene-hydrochlorothiazide (MAXZIDE-25) 37.5-25 MG tablet TAKE 1/2 TABLET TO 1 TABLET BY MOUTH DAILY. 30 tablet 6   No current facility-administered medications on file prior to visit.     Review of Systems Constitutional:   No  weight loss, night sweats,  Fevers, chills,  +fatigue, or  lassitude.  HEENT:   No headaches,  Difficulty swallowing,  Tooth/dental problems, or  Sore throat,  No sneezing, itching, ear ache, nasal congestion, post nasal drip,   CV:  No chest pain,  Orthopnea, PND, swelling in lower extremities, anasarca, dizziness, palpitations, syncope.   GI  No heartburn, indigestion, abdominal pain, nausea, vomiting, diarrhea, change in bowel habits, loss of appetite, bloody stools.   Resp: .  No chest wall deformity  Skin: no rash or lesions.  GU: no dysuria, change in color of urine, no urgency or frequency.  No flank pain, no hematuria   MS:  No joint pain or swelling.  No decreased range of motion.  No back pain.  Psych:  No change in mood or affect. No depression or anxiety.  No memory loss.         Objective:   Physical Exam Filed Vitals:   12/22/15 0958  BP: 128/64  Pulse: 77  Temp: 97.8 F (36.6 C)  TempSrc:  Oral  Height: 5\' 9"  (1.753 m)  Weight: 279 lb (126.554 kg)  SpO2: 98%   Body mass index is 41.18 kg/(m^2).  GEN: A/Ox3; pleasant , NAD, morbidly obese   HEENT:  [place]/AT,  EACs-clear, TMs-wnl, NOSE-clear, THROAT-clear, no lesions, no postnasal drip or exudate noted. Class 3 MP airway   NECK:  Supple w/ fair ROM; no JVD; normal carotid impulses w/o bruits; no thyromegaly or nodules palpated; no lymphadenopathy.  RESP  Clear  P & A; w/o, wheezes/ rales/ or rhonchi.no accessory muscle use, no dullness to percussion  CARD:  RRR, no m/r/g  , no peripheral edema, pulses intact, no cyanosis or clubbing.  GI:   Soft & nt; nml bowel sounds; no organomegaly or masses detected.  Musco: Warm bil, no deformities or joint swelling noted.   Neuro: alert, no focal deficits noted.    Skin: Warm, no lesions or rashes         Assessment & Plan:

## 2015-12-22 NOTE — Assessment & Plan Note (Signed)
Wt loss encouarged  

## 2015-12-22 NOTE — Patient Instructions (Signed)
Order to DME for new CPAP or repair old one.  Wear CPAP At bedtime  For at least 4-6hr each night.  Work on weight loss.  Do not drive if sleepy.  Follow up Dr. Halford Chessman  In 6 months and As needed   Once new machine , CPAP download in 1 month.

## 2015-12-22 NOTE — Assessment & Plan Note (Signed)
Not able to wear machine due to malfunction of machine.   Plan  Order to DME for new CPAP or repair old one.  Wear CPAP At bedtime  For at least 4-6hr each night.  Work on weight loss.  Do not drive if sleepy.  Follow up Dr. Halford Chessman  In 6 months and As needed   Once new machine , CPAP download in 1 month.

## 2015-12-25 ENCOUNTER — Other Ambulatory Visit: Payer: Federal, State, Local not specified - PPO | Admitting: Internal Medicine

## 2015-12-25 DIAGNOSIS — Z0000 Encounter for general adult medical examination without abnormal findings: Secondary | ICD-10-CM

## 2015-12-25 DIAGNOSIS — E785 Hyperlipidemia, unspecified: Secondary | ICD-10-CM

## 2015-12-25 DIAGNOSIS — I10 Essential (primary) hypertension: Secondary | ICD-10-CM

## 2015-12-25 DIAGNOSIS — Z862 Personal history of diseases of the blood and blood-forming organs and certain disorders involving the immune mechanism: Secondary | ICD-10-CM

## 2015-12-25 DIAGNOSIS — E119 Type 2 diabetes mellitus without complications: Secondary | ICD-10-CM

## 2015-12-25 LAB — COMPLETE METABOLIC PANEL WITH GFR
ALT: 17 U/L (ref 9–46)
AST: 18 U/L (ref 10–35)
Albumin: 4 g/dL (ref 3.6–5.1)
Alkaline Phosphatase: 69 U/L (ref 40–115)
BUN: 27 mg/dL — ABNORMAL HIGH (ref 7–25)
CO2: 26 mmol/L (ref 20–31)
Calcium: 9.4 mg/dL (ref 8.6–10.3)
Chloride: 102 mmol/L (ref 98–110)
Creat: 1.4 mg/dL — ABNORMAL HIGH (ref 0.70–1.25)
GFR, Est African American: 59 mL/min — ABNORMAL LOW (ref >=60)
GFR, Est Non African American: 51 mL/min — ABNORMAL LOW (ref >=60)
GLUCOSE: 93 mg/dL (ref 65–99)
Potassium: 4.1 mmol/L (ref 3.5–5.3)
SODIUM: 138 mmol/L (ref 135–146)
TOTAL PROTEIN: 7.2 g/dL (ref 6.1–8.1)
Total Bilirubin: 0.5 mg/dL (ref 0.2–1.2)

## 2015-12-25 LAB — CBC WITH DIFFERENTIAL/PLATELET
BASOS ABS: 0 10*3/uL (ref 0.0–0.1)
BASOS PCT: 0 % (ref 0–1)
EOS ABS: 0.1 10*3/uL (ref 0.0–0.7)
Eosinophils Relative: 1 % (ref 0–5)
HCT: 37.5 % — ABNORMAL LOW (ref 39.0–52.0)
Hemoglobin: 12.3 g/dL — ABNORMAL LOW (ref 13.0–17.0)
Lymphocytes Relative: 34 % (ref 12–46)
Lymphs Abs: 3.4 10*3/uL (ref 0.7–4.0)
MCH: 31 pg (ref 26.0–34.0)
MCHC: 32.8 g/dL (ref 30.0–36.0)
MCV: 94.5 fL (ref 78.0–100.0)
MPV: 9.6 fL (ref 8.6–12.4)
Monocytes Absolute: 0.6 10*3/uL (ref 0.1–1.0)
Monocytes Relative: 6 % (ref 3–12)
NEUTROS ABS: 5.9 10*3/uL (ref 1.7–7.7)
NEUTROS PCT: 59 % (ref 43–77)
PLATELETS: 281 10*3/uL (ref 150–400)
RBC: 3.97 MIL/uL — ABNORMAL LOW (ref 4.22–5.81)
RDW: 15.2 % (ref 11.5–15.5)
WBC: 10 10*3/uL (ref 4.0–10.5)

## 2015-12-25 LAB — LIPID PANEL
Cholesterol: 178 mg/dL (ref 125–200)
HDL: 73 mg/dL (ref >=40)
LDL Cholesterol: 92 mg/dL (ref <130)
TRIGLYCERIDES: 67 mg/dL (ref <150)
Total CHOL/HDL Ratio: 2.4 Ratio (ref <=5.0)
VLDL: 13 mg/dL (ref <30)

## 2015-12-26 ENCOUNTER — Encounter: Payer: Managed Care, Other (non HMO) | Admitting: Internal Medicine

## 2015-12-26 LAB — HEMOGLOBIN A1C
HEMOGLOBIN A1C: 6.2 % — AB (ref <5.7)
MEAN PLASMA GLUCOSE: 131 mg/dL — AB (ref <117)

## 2015-12-30 ENCOUNTER — Encounter: Payer: Federal, State, Local not specified - PPO | Admitting: Internal Medicine

## 2016-01-14 ENCOUNTER — Other Ambulatory Visit: Payer: Self-pay | Admitting: Internal Medicine

## 2016-02-09 ENCOUNTER — Encounter: Payer: Self-pay | Admitting: Internal Medicine

## 2016-02-09 ENCOUNTER — Ambulatory Visit (INDEPENDENT_AMBULATORY_CARE_PROVIDER_SITE_OTHER): Payer: Medicare Other | Admitting: Internal Medicine

## 2016-02-09 VITALS — BP 112/60 | HR 78 | Temp 97.9°F | Resp 20 | Ht 69.0 in | Wt 273.0 lb

## 2016-02-09 DIAGNOSIS — E119 Type 2 diabetes mellitus without complications: Secondary | ICD-10-CM | POA: Diagnosis not present

## 2016-02-09 DIAGNOSIS — R7989 Other specified abnormal findings of blood chemistry: Secondary | ICD-10-CM

## 2016-02-09 DIAGNOSIS — Z8546 Personal history of malignant neoplasm of prostate: Secondary | ICD-10-CM | POA: Diagnosis not present

## 2016-02-09 DIAGNOSIS — E785 Hyperlipidemia, unspecified: Secondary | ICD-10-CM | POA: Diagnosis not present

## 2016-02-09 DIAGNOSIS — E669 Obesity, unspecified: Secondary | ICD-10-CM

## 2016-02-09 DIAGNOSIS — Z0000 Encounter for general adult medical examination without abnormal findings: Secondary | ICD-10-CM

## 2016-02-09 DIAGNOSIS — K219 Gastro-esophageal reflux disease without esophagitis: Secondary | ICD-10-CM | POA: Diagnosis not present

## 2016-02-09 DIAGNOSIS — D649 Anemia, unspecified: Secondary | ICD-10-CM

## 2016-02-09 DIAGNOSIS — I10 Essential (primary) hypertension: Secondary | ICD-10-CM | POA: Diagnosis not present

## 2016-02-09 DIAGNOSIS — R748 Abnormal levels of other serum enzymes: Secondary | ICD-10-CM

## 2016-02-09 LAB — POCT URINALYSIS DIPSTICK
BILIRUBIN UA: NEGATIVE
Blood, UA: NEGATIVE
GLUCOSE UA: NEGATIVE
KETONES UA: NEGATIVE
Leukocytes, UA: NEGATIVE
Nitrite, UA: NEGATIVE
PROTEIN UA: NEGATIVE
Urobilinogen, UA: 0.2
pH, UA: 6.5

## 2016-02-09 NOTE — Patient Instructions (Addendum)
RTC in 6 months. Continue diet exercise and weight loss efforts. He is to return next week for repeat BUN and creatinine when he is well hydrated. Anemia studies will be drawn at the same time. Suspect he has some mild chronic kidney disease.

## 2016-02-11 NOTE — Progress Notes (Signed)
Subjective:    Patient ID: Leonard [name], male    DOB: 1946-09-04, 70 y.o.   MRN: [id number]  HPI 70 year old Black Male in today for health maintenance exam and evaluation of medical issues. 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  He underwent surgery for prostate cancer June 2015 and is followed by Dr. Tresa Moore. He is also followed at the [facility] by Dr. Earlie Server for history of iron deficiency anemia and benign monoclonal gammopathy. He has a history of essential hypertension, diabetes mellitus, obesity, metabolic syndrome, hyperlipidemia. History of obstructive sleep apnea. He's followed by Dr. Dorris Carnes for history of coronary artery disease. Had minimal coronary artery disease on catheterization in 2007. He has a history of gout and GE reflux. History of adenomatous colon polyp. History of tinea versicolor. History of bilateral carpal tunnel syndrome, peripheral neuropathy, low testosterone.  He now weighs 273 pounds. Previously weighed 290 pounds in January 2015.  Adenomatous colon polyp was found in 2007 .colonoscopy in 2011 showed no reason for iron deficiency. He had an upper endoscopy in February 2011 showing moderate gastritis.  History of angioedema but no recent episodes. Mainly occurs eating certain foods.  He has annual eye exam.  Thrombosed external hemorrhoid incised  by Dr. Georgette Dover in 2010  Social history: He is married. Does not smoke or consume alcohol. He's mostly sedentary.  Family history: Father died of an MI. Mother died of an MI. 2 brothers with history of diabetes. One sister. Mother had diabetes. Both parents had hypertension.     Review of Systems No new complaints. Says he feels well.      Objective:   Physical Exam  Constitutional: He is oriented to person, place, and time. He appears well-developed and well-nourished. No distress.  HENT:  Head: Normocephalic and atraumatic.  Right Ear: External  ear normal.  Left Ear: External ear normal.  Mouth/Throat: Oropharynx is clear and moist.  Eyes: Conjunctivae and EOM are normal. Pupils are equal, round, and reactive to light.  Neck: Neck supple. No JVD present. No thyromegaly present.  Cardiovascular: Normal rate, regular rhythm and normal heart sounds.   No murmur heard. Pulmonary/Chest: Effort normal and breath sounds normal. No respiratory distress. He has no wheezes. He has no rales.  Abdominal: Soft. Bowel sounds are normal. He exhibits no distension and no mass. There is no tenderness. There is no rebound and no guarding.  Genitourinary:  Rectal exam deferred to urologist  Musculoskeletal: He exhibits no edema.  Lymphadenopathy:    He has no cervical adenopathy.  Neurological: He is alert and oriented to person, place, and time. He has normal reflexes. No cranial nerve deficit. Coordination normal.  Skin: Skin is warm and dry. No rash noted. He is not diaphoretic.  Psychiatric: He has a normal mood and affect. His behavior is normal. Thought content normal.  Vitals reviewed.         Assessment &Plan:  Morbid obesity  Adult onset diabetes mellitus  Essential hypertension  Hyperlipidemia  Nonocclusive coronary disease  GE reflux  History of gout  History of tinea versicolor  History of prostate cancer status post prostatectomy  History of bilateral carpal tunnel syndrome  Peripheral neuropathy  Obstructive sleep apnea  History of kidney stones  History of benign monoclonal gammopathy  History of iron deficiency  History of gastritis  History of adenomatous colon polyp  History of angioedema  Microalbuminuria  Plan: Hemoglobin A1c is  stable at 6.2%. Serum creatinine elevated 1.4. Will be repeated when he is well-hydrated on April 3. He could be developing chronic kidney disease. He really could benefit from exercise and weight loss. He has sleep apnea. Iron deficiency likely due to gastritis.  Prostatectomy in 2015 and has done well. No complaint of chest pain. GE reflux stable on PPI.     Subjective:   Patient presents for Medicare Annual/Subsequent preventive examination.  Review Past Medical/Family/Social:See above   Risk Factors  Current exercise habits: Sedentary Dietary issues discussed: Low fat low carbohydrate  Cardiac risk factors:Family history, history of coronary disease, hyperlipidemia, diabetes  Depression Screen  (Note: if answer to either of the following is "Yes", a more complete depression screening is indicated)   Over the past two weeks, have you felt down, depressed or hopeless? No  Over the past two weeks, have you felt little interest or pleasure in doing things? No Have you lost interest or pleasure in daily life? No Do you often feel hopeless? No Do you cry easily over simple problems? No   Activities of Daily Living  In your present state of health, do you have any difficulty performing the following activities?:   Driving? No  Managing money? No  Feeding yourself? No  Getting from bed to chair? No  Climbing a flight of stairs? No  Preparing food and eating?: No  Bathing or showering? No  Getting dressed: No  Getting to the toilet? No  Using the toilet:No  Moving around from place to place: No  In the past year have you fallen or had a near fall?:No  Are you sexually active? yes  Do you have more than one partner? No   Hearing Difficulties: No  Do you often ask people to speak up or repeat themselves? No  Do you experience ringing or noises in your ears? No  Do you have difficulty understanding soft or whispered voices? No  Do you feel that you have a problem with memory? No Do you often misplace items? No    Home Safety:  Do you have a smoke alarm at your residence? Yes Do you have grab bars in the bathroom?No Do you have throw rugs in your house? Yes   Cognitive Testing  Alert? Yes Normal Appearance?Yes  Oriented to  person? Yes Place? Yes  Time? Yes  Recall of three objects? Yes  Can perform simple calculations? Yes  Displays appropriate judgment?Yes  Can read the correct time from a watch face?Yes   List the Names of Other Physician/Practitioners you currently use:  See referral list for the physicians patient is currently seeing.  Dr. Tresa Moore  Dr. Lars Pinks physician   Review of Systems: See above   Objective:     General appearance: Appears stated age and  obese  Head: Normocephalic, without obvious abnormality, atraumatic  Eyes: conj clear, EOMi PEERLA  Ears: normal TM's and external ear canals both ears  Nose: Nares normal. Septum midline. Mucosa normal. No drainage or sinus tenderness.  Throat: lips, mucosa, and tongue normal; teeth and gums normal  Neck: no adenopathy, no carotid bruit, no JVD, supple, symmetrical, trachea midline and thyroid not enlarged, symmetric, no tenderness/mass/nodules  No CVA tenderness.  Lungs: clear to auscultation bilaterally  Breasts: normal appearance, no masses or tenderness Heart: regular rate and rhythm, S1, S2 normal, no murmur, click, rub or gallop  Abdomen: soft, non-tender; bowel sounds normal; no masses, no organomegaly  Musculoskeletal: ROM normal in all joints,  no crepitus, no deformity, Normal muscle strengthen. Back  is symmetric, no curvature. Skin: Skin color, texture, turgor normal. No rashes or lesions  Lymph nodes: Cervical, supraclavicular, and axillary nodes normal.  Neurologic: CN 2 -12 Normal, Normal symmetric reflexes. Normal coordination and gait  Psych: Alert & Oriented x 3, Mood appear stable.    Assessment:    Annual wellness medicare exam   Plan:    During the course of the visit the patient was educated and counseled about appropriate screening and preventive services including:  Continue to be followed by urologist for prostate cancer  Recommend annual flu vaccine      Patient Instructions (the written  plan) was given to the patient.  Medicare Attestation  I have personally reviewed:  The patient's medical and social history  Their use of alcohol, tobacco or illicit drugs  Their current medications and supplements  The patient's functional ability including ADLs,fall risks, home safety risks, cognitive, and hearing and visual impairment  Diet and physical activities  Evidence for depression or mood disorders  The patient's weight, height, BMI, and visual acuity have been recorded in the chart. I have made referrals, counseling, and provided education to the patient based on review of the above and I have provided the patient with a written personalized care plan for preventive services.

## 2016-02-16 ENCOUNTER — Other Ambulatory Visit: Payer: Medicare Other | Admitting: Internal Medicine

## 2016-02-16 DIAGNOSIS — R7989 Other specified abnormal findings of blood chemistry: Secondary | ICD-10-CM

## 2016-02-16 DIAGNOSIS — D649 Anemia, unspecified: Secondary | ICD-10-CM

## 2016-02-16 LAB — BASIC METABOLIC PANEL
BUN: 23 mg/dL (ref 7–25)
CHLORIDE: 103 mmol/L (ref 98–110)
CO2: 24 mmol/L (ref 20–31)
CREATININE: 1.28 mg/dL — AB (ref 0.70–1.25)
Calcium: 9.8 mg/dL (ref 8.6–10.3)
GLUCOSE: 117 mg/dL — AB (ref 65–99)
POTASSIUM: 4 mmol/L (ref 3.5–5.3)
Sodium: 140 mmol/L (ref 135–146)

## 2016-02-16 LAB — VITAMIN B12: VITAMIN B 12: 970 pg/mL (ref 200–1100)

## 2016-02-16 LAB — IRON AND TIBC
%SAT: 15 % (ref 15–60)
Iron: 50 ug/dL (ref 50–180)
TIBC: 333 ug/dL (ref 250–425)
UIBC: 283 ug/dL (ref 125–400)

## 2016-02-16 LAB — FOLATE

## 2016-02-17 ENCOUNTER — Telehealth: Payer: Self-pay

## 2016-02-17 MED ORDER — FERROUS SULFATE 325 (65 FE) MG PO TBEC: 325.0000 mg | DELAYED_RELEASE_TABLET | Freq: Every day | ORAL | Status: DC

## 2016-02-17 NOTE — Telephone Encounter (Signed)
-----   Message from [name], MD sent at 02/17/2016 10:00 AM EDT ----- Iron is low normal. Please take iron sulfate 325 mg once a day and recheck CBC in 3 months.

## 2016-02-23 ENCOUNTER — Other Ambulatory Visit: Payer: Self-pay | Admitting: Internal Medicine

## 2016-02-26 ENCOUNTER — Other Ambulatory Visit (HOSPITAL_BASED_OUTPATIENT_CLINIC_OR_DEPARTMENT_OTHER): Payer: Medicare Other

## 2016-02-26 DIAGNOSIS — D472 Monoclonal gammopathy: Secondary | ICD-10-CM

## 2016-02-26 DIAGNOSIS — D509 Iron deficiency anemia, unspecified: Secondary | ICD-10-CM

## 2016-02-26 LAB — COMPREHENSIVE METABOLIC PANEL
ALT: 15 U/L (ref 0–55)
AST: 16 U/L (ref 5–34)
Albumin: 3.6 g/dL (ref 3.5–5.0)
Alkaline Phosphatase: 65 U/L (ref 40–150)
Anion Gap: 9 mEq/L (ref 3–11)
BILIRUBIN TOTAL: 0.48 mg/dL (ref 0.20–1.20)
BUN: 28.3 mg/dL — ABNORMAL HIGH (ref 7.0–26.0)
CO2: 25 meq/L (ref 22–29)
CREATININE: 1.3 mg/dL (ref 0.7–1.3)
Calcium: 9.8 mg/dL (ref 8.4–10.4)
Chloride: 105 mEq/L (ref 98–109)
EGFR: 65 mL/min/{1.73_m2} — ABNORMAL LOW (ref >90)
GLUCOSE: 96 mg/dL (ref 70–140)
Potassium: 3.8 mEq/L (ref 3.5–5.1)
SODIUM: 139 meq/L (ref 136–145)
TOTAL PROTEIN: 7.5 g/dL (ref 6.4–8.3)

## 2016-02-26 LAB — CBC WITH DIFFERENTIAL/PLATELET
BASO%: 0.1 % (ref 0.0–2.0)
Basophils Absolute: 0 10*3/uL (ref 0.0–0.1)
EOS%: 1.7 % (ref 0.0–7.0)
Eosinophils Absolute: 0.2 10*3/uL (ref 0.0–0.5)
HCT: 36 % — ABNORMAL LOW (ref 38.4–49.9)
HGB: 12 g/dL — ABNORMAL LOW (ref 13.0–17.1)
LYMPH%: 37 % (ref 14.0–49.0)
MCH: 31.1 pg (ref 27.2–33.4)
MCHC: 33.3 g/dL (ref 32.0–36.0)
MCV: 93.3 fL (ref 79.3–98.0)
MONO#: 0.6 10*3/uL (ref 0.1–0.9)
MONO%: 7 % (ref 0.0–14.0)
NEUT%: 54.2 % (ref 39.0–75.0)
NEUTROS ABS: 4.8 10*3/uL (ref 1.5–6.5)
PLATELETS: 238 10*3/uL (ref 140–400)
RBC: 3.86 10*6/uL — AB (ref 4.20–5.82)
RDW: 14.7 % — ABNORMAL HIGH (ref 11.0–14.6)
WBC: 8.9 10*3/uL (ref 4.0–10.3)
lymph#: 3.3 10*3/uL (ref 0.9–3.3)

## 2016-02-26 LAB — LACTATE DEHYDROGENASE: LDH: 149 U/L (ref 125–245)

## 2016-02-26 LAB — IRON AND TIBC
%SAT: 24 % (ref 20–55)
Iron: 72 ug/dL (ref 42–163)
TIBC: 304 ug/dL (ref 202–409)
UIBC: 232 ug/dL (ref 117–376)

## 2016-02-26 LAB — FERRITIN: Ferritin: 65 ng/ml (ref 22–316)

## 2016-02-27 LAB — IGG, IGA, IGM
IGA/IMMUNOGLOBULIN A, SERUM: 211 mg/dL (ref 61–437)
IgM, Qn, Serum: 146 mg/dL (ref 20–172)

## 2016-02-27 LAB — KAPPA/LAMBDA LIGHT CHAINS
IG KAPPA FREE LIGHT CHAIN: 46.09 mg/L — AB (ref 3.30–19.40)
IG LAMBDA FREE LIGHT CHAIN: 21.43 mg/L (ref 5.71–26.30)
Kappa/Lambda FluidC Ratio: 2.15 — ABNORMAL HIGH (ref 0.26–1.65)

## 2016-02-28 LAB — BETA 2 MICROGLOBULIN, SERUM: BETA 2: 2.4 mg/L (ref 0.6–2.4)

## 2016-03-03 ENCOUNTER — Telehealth: Payer: Self-pay | Admitting: Internal Medicine

## 2016-03-03 NOTE — Telephone Encounter (Signed)
returned call adn s.w. pt and confirmed appts for 4.20.Marland KitchenMarland Kitchenpt ok and aware

## 2016-03-04 ENCOUNTER — Ambulatory Visit (HOSPITAL_BASED_OUTPATIENT_CLINIC_OR_DEPARTMENT_OTHER): Payer: Medicare Other | Admitting: Internal Medicine

## 2016-03-04 ENCOUNTER — Telehealth: Payer: Self-pay | Admitting: Internal Medicine

## 2016-03-04 ENCOUNTER — Encounter: Payer: Self-pay | Admitting: Internal Medicine

## 2016-03-04 VITALS — BP 151/63 | HR 78 | Temp 98.3°F | Resp 18 | Ht 69.0 in | Wt 279.6 lb

## 2016-03-04 DIAGNOSIS — D509 Iron deficiency anemia, unspecified: Secondary | ICD-10-CM

## 2016-03-04 DIAGNOSIS — D472 Monoclonal gammopathy: Secondary | ICD-10-CM | POA: Diagnosis not present

## 2016-03-04 DIAGNOSIS — Z8546 Personal history of malignant neoplasm of prostate: Secondary | ICD-10-CM | POA: Diagnosis not present

## 2016-03-04 NOTE — Telephone Encounter (Signed)
Gave and printed appt sched and avs fo rpt for OCT °

## 2016-03-04 NOTE — Progress Notes (Signed)
[facility] [phone]   [phone]  OFFICE PROGRESS NOTE  [name], MD [address] [place] [id number]  DIAGNOSIS:  1) monoclonal gammopathy of undetermined significance.  2) iron deficiency anemia.  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 Tresa Moore.   PRIOR THERAPY: Feraheme infusion x 2 doses, last dose was given in October of 2014.  CURRENT THERAPY: Over-the-counter ferrous sulfate.  INTERVAL HISTORY: Leonard [name] 70 y.o. male returns to the clinic today for 6 months follow up visit. He is feeling very well today with no significant fatigue or weakness. He retired recently and enjoying his retirement time. He was started recently on oral iron tablets by Dr. Renold Genta. The patient denied having any significant nausea or vomiting. He denied having any significant chest pain, shortness of breath, cough or hemoptysis. He has a recent lab work including CBC, iron study and ferritin as well as myeloma panel and he is here for evaluation and discussion of his lab results.  MEDICAL HISTORY: Past Medical History  Diagnosis Date  . Type II or unspecified type diabetes mellitus without mention of complication, not stated as uncontrolled   . Gouty arthropathy   . Other and unspecified hyperlipidemia   . Unspecified essential hypertension   . OSA (obstructive sleep apnea)     USES CPAP MACHINE   . Diverticulosis of colon (without mention of hemorrhage) 09/05/2006    Colonoscopy   . Hx of colonic polyps 09/05/2006    Colonoscopy(ADENOMATOUS POLYP)  . Hiatal hernia 09/05/2006    EGD  . 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 GERD (gastroesophageal reflux disease) 07/26/2001    EGD(Chronic)  . Hemorrhoids   . Coronary artery disease   . Prostate  cancer ([place]) 2015  . Iron deficiency anemia, unspecified   . Gastric polyp 2014    done at [facility]:  is allergic to dicloxacillin; cayenne pepper; and nsaids.  MEDICATIONS:  Current Outpatient Prescriptions  Medication Sig Dispense Refill  . allopurinol (ZYLOPRIM) 300 MG tablet TAKE 1 TABLET BY MOUTH DAILY TO PREVENT GOUT 90 tablet 3  . amLODipine-benazepril (LOTREL) 10-20 MG capsule TAKE 1 CAPSULE BY MOUTH EVERY DAY 90 capsule 3  . aspirin 81 MG tablet Take 81 mg by mouth daily.    Marland Kitchen atorvastatin (LIPITOR) 10 MG tablet TAKE 1 TABLET BY MOUTH EVERY DAY 90 tablet 3  . esomeprazole (NEXIUM) 40 MG capsule TAKE ONE CAPSULE BY MOUTH EVERY DAY 90 capsule 3  . FeFum-FePoly-FA-B Cmp-C-Biot (INTEGRA PLUS) CAPS Take 1 capsule by mouth every morning. 30 capsule 5  . ferrous sulfate (FE TABS) 325 (65 FE) MG EC tablet Take 1 tablet (325 mg total) by mouth daily with breakfast. 30 tablet 3  . triamterene-hydrochlorothiazide (MAXZIDE-25) 37.5-25 MG tablet TAKE 1/2 TABLET TO 1 TABLET BY MOUTH DAILY. 30 tablet 6  . nitroGLYCERIN (NITROSTAT) 0.4 MG SL tablet Place 0.4 mg under the tongue every 5 (five) minutes as needed for chest pain. Reported on 03/04/2016    . TRADJENTA 5 MG TABS tablet TAKE 1 TABLET BY MOUTH EVERY DAY 90 tablet 3   No current facility-administered medications for this visit.    SURGICAL HISTORY:  Past Surgical History  Procedure Laterality Date  . Knee surgery  1999    TUMOR REMOVED  LEFT KNEE & ARTHROSCOPIC SURGERY  . Hemorrhoid surgery    . Robot assisted laparoscopic radical prostatectomy N/A 05/01/2014    Procedure: ROBOTIC ASSISTED LAPAROSCOPIC RADICAL PROSTATECTOMY WITH INDOCYANINE GREEN DYE;  Surgeon: Alexis Frock, MD;  Location: [facility];  Service: Urology;  Laterality: N/A;  . Lymphadenectomy Bilateral 05/01/2014    Procedure: LYMPHADENECTOMY;  Surgeon: Alexis Frock, MD;  Location: [facility];  Service: Urology;  Laterality: Bilateral;  . Esophagogastroduodenoscopy       Done at [facility]  . Gi prostate biopsy    . Incisional hernia repair N/A 08/07/2015    Procedure: LAPAROSCOPIC INCISIONAL HERNIA repair with mesh;  Surgeon: Arta Bruce Kinsinger, MD;  Location: [facility];  Service: General;  Laterality: N/A;    REVIEW OF SYSTEMS:  A comprehensive review of systems was negative.   PHYSICAL EXAMINATION: General appearance: alert, cooperative, fatigued and no distress Head: Normocephalic, without obvious abnormality, atraumatic Neck: no adenopathy and thyroid not enlarged, symmetric, no tenderness/mass/nodules Lymph nodes: Cervical, supraclavicular, and axillary nodes normal. Resp: clear to auscultation bilaterally Back: symmetric, no curvature. ROM normal. No CVA tenderness. Cardio: regular rate and rhythm, S1, S2 normal, no murmur, click, rub or gallop GI: soft, non-tender; bowel sounds normal; no masses,  no organomegaly Extremities: extremities normal, atraumatic, no cyanosis or edema Neurologic: Alert and oriented X 3, normal strength and tone. Normal symmetric reflexes. Normal coordination and gait  ECOG PERFORMANCE STATUS: 1 - Symptomatic but completely ambulatory  Blood pressure 151/63, pulse 78, temperature 98.3 F (36.8 C), temperature source Oral, resp. rate 18, height 5\' 9"  (1.753 m), weight 279 lb 9.6 oz (126.826 kg), SpO2 99 %.  LABORATORY DATA: Lab Results  Component Value Date   WBC 8.9 02/26/2016   HGB 12.0* 02/26/2016   HCT 36.0* 02/26/2016   MCV 93.3 02/26/2016   PLT 238 02/26/2016      Chemistry      Component Value Date/Time   NA 139 02/26/2016 1030   NA 140 02/16/2016 1110   K 3.8 02/26/2016 1030   K 4.0 02/16/2016 1110   CL 103 02/16/2016 1110   CL 104 04/17/2013 0857   CO2 25 02/26/2016 1030   CO2 24 02/16/2016 1110   BUN 28.3* 02/26/2016 1030   BUN 23 02/16/2016 1110   CREATININE 1.3 02/26/2016 1030   CREATININE 1.28* 02/16/2016 1110   CREATININE 1.11 08/15/2015 0440      Component Value Date/Time   CALCIUM 9.8  02/26/2016 1030   CALCIUM 9.8 02/16/2016 1110   ALKPHOS 65 02/26/2016 1030   ALKPHOS 69 12/25/2015 0911   AST 16 02/26/2016 1030   AST 18 12/25/2015 0911   ALT 15 02/26/2016 1030   ALT 17 12/25/2015 0911   BILITOT 0.48 02/26/2016 1030   BILITOT 0.5 12/25/2015 0911     Other lab studies: ferritin 65, serum iron 72, total iron-binding capacity 304 and iron saturation 24%.   RADIOGRAPHIC STUDIES: No results found.  ASSESSMENT AND PLAN:  1) iron deficiency anemia most likely secondary to history of GI blood loss. He has a stable hemoglobin and hematocrit. He has normal iron study today. I recommended for the patient to continue on oral iron tablets as prescribed by Dr. Renold Genta.  2) Monoclonal gammopathy of undetermined significance: no significant evidence for disease progression. The patient will continue on observation for now.  3) prostate adenocarcinoma status post radical prostatectomy under the care of Dr. Tresa Moore. He will continue to followup with his urologist.  He would come back for follow  up visit in 6 months with repeat CBC, iron study, ferritin and serum light chain. The patient was advised to call immediately if he has any concerning symptoms in the interval.  The patient voices understanding of current disease status and treatment options and is in agreement with the current care plan.  All questions were answered. The patient knows to call the clinic with any problems, questions or concerns. We can certainly see the patient much sooner if necessary.  Disclaimer: This note was dictated with voice recognition software. Similar sounding words can inadvertently be transcribed and may not be corrected upon review.

## 2016-03-07 ENCOUNTER — Other Ambulatory Visit: Payer: Self-pay | Admitting: Internal Medicine

## 2016-04-12 ENCOUNTER — Other Ambulatory Visit: Payer: Self-pay | Admitting: Internal Medicine

## 2016-05-08 ENCOUNTER — Emergency Department (HOSPITAL_BASED_OUTPATIENT_CLINIC_OR_DEPARTMENT_OTHER)
Admission: EM | Admit: 2016-05-08 | Discharge: 2016-05-08 | Disposition: A | Discharge status: Home / Self Care | Payer: Medicare Other | Attending: Emergency Medicine | Admitting: Emergency Medicine

## 2016-05-08 ENCOUNTER — Emergency Department (HOSPITAL_BASED_OUTPATIENT_CLINIC_OR_DEPARTMENT_OTHER): Payer: Medicare Other

## 2016-05-08 ENCOUNTER — Encounter (HOSPITAL_BASED_OUTPATIENT_CLINIC_OR_DEPARTMENT_OTHER): Payer: Self-pay | Admitting: *Deleted

## 2016-05-08 ENCOUNTER — Other Ambulatory Visit: Payer: Self-pay

## 2016-05-08 DIAGNOSIS — I2510 Atherosclerotic heart disease of native coronary artery without angina pectoris: Secondary | ICD-10-CM | POA: Diagnosis not present

## 2016-05-08 DIAGNOSIS — M546 Pain in thoracic spine: Secondary | ICD-10-CM | POA: Insufficient documentation

## 2016-05-08 DIAGNOSIS — Z8546 Personal history of malignant neoplasm of prostate: Secondary | ICD-10-CM | POA: Insufficient documentation

## 2016-05-08 DIAGNOSIS — R05 Cough: Secondary | ICD-10-CM | POA: Insufficient documentation

## 2016-05-08 DIAGNOSIS — R079 Chest pain, unspecified: Secondary | ICD-10-CM | POA: Diagnosis not present

## 2016-05-08 DIAGNOSIS — I10 Essential (primary) hypertension: Secondary | ICD-10-CM | POA: Insufficient documentation

## 2016-05-08 DIAGNOSIS — R200 Anesthesia of skin: Secondary | ICD-10-CM | POA: Diagnosis not present

## 2016-05-08 DIAGNOSIS — R0789 Other chest pain: Secondary | ICD-10-CM | POA: Insufficient documentation

## 2016-05-08 DIAGNOSIS — Z79899 Other long term (current) drug therapy: Secondary | ICD-10-CM | POA: Insufficient documentation

## 2016-05-08 DIAGNOSIS — Z7982 Long term (current) use of aspirin: Secondary | ICD-10-CM | POA: Diagnosis not present

## 2016-05-08 DIAGNOSIS — E119 Type 2 diabetes mellitus without complications: Secondary | ICD-10-CM | POA: Diagnosis not present

## 2016-05-08 LAB — COMPREHENSIVE METABOLIC PANEL
ALBUMIN: 4.2 g/dL (ref 3.5–5.0)
ALT: 20 U/L (ref 17–63)
AST: 19 U/L (ref 15–41)
Alkaline Phosphatase: 61 U/L (ref 38–126)
Anion gap: 9 (ref 5–15)
BILIRUBIN TOTAL: 0.4 mg/dL (ref 0.3–1.2)
BUN: 29 mg/dL — AB (ref 6–20)
CHLORIDE: 104 mmol/L (ref 101–111)
CO2: 27 mmol/L (ref 22–32)
CREATININE: 1.38 mg/dL — AB (ref 0.61–1.24)
Calcium: 9.1 mg/dL (ref 8.9–10.3)
GFR calc Af Amer: 59 mL/min — ABNORMAL LOW (ref >60)
GFR, EST NON AFRICAN AMERICAN: 51 mL/min — AB (ref >60)
GLUCOSE: 126 mg/dL — AB (ref 65–99)
Potassium: 4 mmol/L (ref 3.5–5.1)
Sodium: 140 mmol/L (ref 135–145)
Total Protein: 7.6 g/dL (ref 6.5–8.1)

## 2016-05-08 LAB — CBC WITH DIFFERENTIAL/PLATELET
BASOS ABS: 0 10*3/uL (ref 0.0–0.1)
Basophils Relative: 0 %
Eosinophils Absolute: 0.3 10*3/uL (ref 0.0–0.7)
Eosinophils Relative: 3 %
HEMATOCRIT: 36.3 % — AB (ref 39.0–52.0)
Hemoglobin: 11.7 g/dL — ABNORMAL LOW (ref 13.0–17.0)
LYMPHS PCT: 30 %
Lymphs Abs: 3.2 10*3/uL (ref 0.7–4.0)
MCH: 31.1 pg (ref 26.0–34.0)
MCHC: 32.2 g/dL (ref 30.0–36.0)
MCV: 96.5 fL (ref 78.0–100.0)
Monocytes Absolute: 0.6 10*3/uL (ref 0.1–1.0)
Monocytes Relative: 5 %
Neutro Abs: 6.6 10*3/uL (ref 1.7–7.7)
Neutrophils Relative %: 62 %
PLATELETS: 265 10*3/uL (ref 150–400)
RBC: 3.76 MIL/uL — AB (ref 4.22–5.81)
RDW: 14.5 % (ref 11.5–15.5)
WBC: 10.7 10*3/uL — AB (ref 4.0–10.5)

## 2016-05-08 LAB — TROPONIN I: Troponin I: <0.03 ng/mL (ref <0.031)

## 2016-05-08 LAB — D-DIMER, QUANTITATIVE: D-Dimer, Quant: 1.2 ug/mL-FEU — ABNORMAL HIGH (ref 0.00–0.50)

## 2016-05-08 MED ORDER — GI COCKTAIL ~~LOC~~
30.0000 mL | Freq: Once | ORAL | Status: AC
Start: 2016-05-08 — End: 2016-05-08
  Administered 2016-05-08: 30 mL via ORAL
  Filled 2016-05-08: qty 30

## 2016-05-08 MED ORDER — ACETAMINOPHEN 325 MG PO TABS
650.0000 mg | ORAL_TABLET | ORAL | Status: DC | PRN
  Administered 2016-05-08: 650 mg via ORAL
  Filled 2016-05-08: qty 2

## 2016-05-08 MED ORDER — METHOCARBAMOL 500 MG PO TABS
500.0000 mg | ORAL_TABLET | Freq: Once | ORAL | Status: AC
  Administered 2016-05-08: 500 mg via ORAL
  Filled 2016-05-08: qty 1

## 2016-05-08 MED ORDER — IOPAMIDOL (ISOVUE-370) INJECTION 76%
100.0000 mL | Freq: Once | INTRAVENOUS | Status: AC | PRN
  Administered 2016-05-08: 100 mL via INTRAVENOUS

## 2016-05-08 MED ORDER — SODIUM CHLORIDE 0.9 % IV BOLUS (SEPSIS)
500.0000 mL | Freq: Once | INTRAVENOUS | Status: AC
  Administered 2016-05-08: 500 mL via INTRAVENOUS

## 2016-05-08 MED ORDER — METHOCARBAMOL 500 MG PO TABS: 500.0000 mg | ORAL_TABLET | Freq: Three times a day (TID) | ORAL | Status: DC | PRN

## 2016-05-08 NOTE — ED Provider Notes (Signed)
CSN: [id number]     Arrival date & time 05/08/16  1022 History   First MD Initiated Contact with Patient 05/08/16 1049     Chief Complaint  Patient presents with  . Chest Pain     (Consider location/radiation/quality/duration/timing/severity/associated sxs/prior Treatment) HPI Patient presents with 2-3 days of central chest tightness. He also complains of right-sided thoracic back pain during that same period of time. States the pain in the back is worse with coughing or deep breathing. He's had a mild cough without sputum production. Denies shortness of breath, nausea or vomiting. No fever or chills. No lower extremity swelling or pain. Patient denies previous history of coronary artery disease. He does not smoke. Denies any recent trauma or heavy lifting. Past Medical History  Diagnosis Date  . Type II or unspecified type diabetes mellitus without mention of complication, not stated as uncontrolled   . Gouty arthropathy   . Other and unspecified hyperlipidemia   . Unspecified essential hypertension   . OSA (obstructive sleep apnea)     USES CPAP MACHINE   . Diverticulosis of colon (without mention of hemorrhage) 09/05/2006    Colonoscopy   . Hx of colonic polyps 09/05/2006    Colonoscopy(ADENOMATOUS POLYP)  . Hiatal hernia 09/05/2006    EGD  . 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 GERD (gastroesophageal reflux disease) 07/26/2001    EGD(Chronic)  . Hemorrhoids   . Coronary artery disease   . Prostate cancer ([place]) 2015  . Iron deficiency anemia, unspecified   . Gastric polyp 2014    done at [facility]   Past Surgical History  Procedure Laterality Date  . 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 Hemorrhoid surgery    . Robot assisted laparoscopic radical prostatectomy N/A 05/01/2014    Procedure: ROBOTIC ASSISTED LAPAROSCOPIC RADICAL PROSTATECTOMY WITH INDOCYANINE GREEN DYE;  Surgeon: Alexis Frock, MD;  Location: [facility];  Service: Urology;  Laterality: N/A;  . Lymphadenectomy Bilateral 05/01/2014    Procedure: LYMPHADENECTOMY;  Surgeon: Alexis Frock, MD;  Location: [facility];  Service: Urology;  Laterality: Bilateral;  . Esophagogastroduodenoscopy      Done at [facility]  . Gi prostate biopsy    . Incisional hernia repair N/A 08/07/2015    Procedure: LAPAROSCOPIC INCISIONAL HERNIA repair with mesh;  Surgeon: Arta Bruce Kinsinger, MD;  Location: [facility];  Service: General;  Laterality: N/A;   Family History  Problem Relation Age of Onset  . Heart attack Father   . Diabetes Mother   . Diabetes Brother    Social History  Substance Use Topics  . Smoking status: Never Smoker   . Smokeless tobacco: Never Used  . Alcohol Use: No    Review of Systems  Constitutional: Negative for fever and chills.  Respiratory: Positive for cough and chest tightness. Negative for shortness of breath.   Cardiovascular: Positive for chest pain. Negative for palpitations and leg swelling.  Gastrointestinal: Negative for nausea, vomiting, abdominal pain, diarrhea and constipation.  Genitourinary: Negative for dysuria and frequency.  Musculoskeletal: Positive for back pain. Negative for neck pain and neck stiffness.  Skin: Negative for rash and wound.  Neurological: Positive for numbness. Negative for dizziness, weakness, light-headedness and headaches.  All other systems reviewed and are negative.     Allergies  Dicloxacillin; Cayenne pepper; and Nsaids  Home Medications   Prior to Admission medications   Medication Sig Start Date End Date Taking?  Authorizing Provider  allopurinol (ZYLOPRIM) 300 MG tablet TAKE 1 TABLET BY MOUTH DAILY TO PREVENT GOUT 03/07/16   [name], MD  amLODipine-benazepril (LOTREL) 10-20 MG capsule TAKE 1 CAPSULE BY MOUTH EVERY DAY 10/22/15   [name], MD  aspirin 81 MG tablet Take 81 mg by mouth daily.    Historical Provider, MD  atorvastatin (LIPITOR) 10 MG tablet TAKE 1 TABLET BY MOUTH  EVERY DAY 06/02/15   Jettie Booze, MD  esomeprazole (NEXIUM) 40 MG capsule TAKE ONE CAPSULE BY MOUTH EVERY DAY 01/14/16   [name], MD  FeFum-FePoly-FA-B Cmp-C-Biot (INTEGRA PLUS) CAPS TAKE 1 CAPSULE BY MOUTH EVERY MORNING. 04/12/16   Curt Bears, MD  ferrous sulfate (FE TABS) 325 (65 FE) MG EC tablet Take 1 tablet (325 mg total) by mouth daily with breakfast. 02/17/16   [name], MD  methocarbamol (ROBAXIN) 500 MG tablet Take 1 tablet (500 mg total) by mouth every 8 (eight) hours as needed for muscle spasms. 05/08/16   Julianne Rice, MD  nitroGLYCERIN (NITROSTAT) 0.4 MG SL tablet Place 0.4 mg under the tongue every 5 (five) minutes as needed for chest pain. Reported on 03/04/2016    Historical Provider, MD  TRADJENTA 5 MG TABS tablet TAKE 1 TABLET BY MOUTH EVERY DAY 02/23/16   [name], MD  triamterene-hydrochlorothiazide (MAXZIDE-25) 37.5-25 MG tablet Take 1 tablet by mouth daily. 03/09/16   Fay Records, MD   BP 120/69 mmHg  Pulse 68  Temp(Src) 97.9 F (36.6 C) (Oral)  Resp 20  Ht 5' 9.25" (1.759 m)  Wt 286 lb (129.729 kg)  BMI 41.93 kg/m2  SpO2 98% Physical Exam  Constitutional: He is oriented to person, place, and time. He appears well-developed and well-nourished. No distress.  HENT:  Head: Normocephalic and atraumatic.  Mouth/Throat: Oropharynx is clear and moist. No oropharyngeal exudate.  Eyes: EOM are normal. Pupils are equal, round, and reactive to light.  Neck: Normal range of motion. Neck supple. No JVD present.  Cardiovascular: Normal rate and regular rhythm.  Exam reveals no gallop and no friction rub.   No murmur heard. Pulmonary/Chest: Effort normal and breath sounds normal. No respiratory distress. He has no wheezes. He has no rales. He exhibits no tenderness.  Abdominal: Soft. Bowel sounds are normal. He exhibits no distension and no mass. There is no tenderness. There is no rebound and no guarding.  Musculoskeletal: Normal range of motion. He  exhibits no edema or tenderness.  No midline thoracic or lumbar pain. Patient has full range of motion of right shoulder without pain. No deformity. No lower extremity swelling or asymmetry.  Neurological: He is alert and oriented to person, place, and time.  Moves all extremities without deficit. Sensation is fully intact.  Skin: Skin is warm and dry. No rash noted. No erythema.  Psychiatric: He has a normal mood and affect. His behavior is normal.  Nursing note and vitals reviewed.   ED Course  Procedures (including critical care time) Labs Review Labs Reviewed  CBC WITH DIFFERENTIAL/PLATELET - Abnormal; Notable for the following:    WBC 10.7 (*)    RBC 3.76 (*)    Hemoglobin 11.7 (*)    HCT 36.3 (*)    All other components within normal limits  COMPREHENSIVE METABOLIC PANEL - Abnormal; Notable for the following:    Glucose, Bld 126 (*)    BUN 29 (*)    Creatinine, Ser 1.38 (*)    GFR calc non Af  Amer 51 (*)    GFR calc Af Amer 59 (*)    All other components within normal limits  D-DIMER, QUANTITATIVE (NOT AT [facility]) - Abnormal; Notable for the following:    D-Dimer, Quant 1.20 (*)    All other components within normal limits  TROPONIN I  TROPONIN I    Imaging Review Dg Chest 2 View  05/08/2016  CLINICAL DATA:  Substernal chest pain and right scapular pain. EXAM: CHEST  2 VIEW COMPARISON:  08/13/2015 FINDINGS: The heart size and mediastinal contours are within normal limits. There is no evidence of pulmonary edema, consolidation, pneumothorax, nodule or pleural fluid. The visualized skeletal structures are unremarkable. IMPRESSION: No active cardiopulmonary disease. Electronically Signed   By: Aletta Edouard M.D.   On: 05/08/2016 12:02   Ct Angio Chest Pe W/cm &/or Wo Cm  05/08/2016  CLINICAL DATA:  Chest tightness.  Elevated D-dimer. EXAM: CT ANGIOGRAPHY CHEST WITH CONTRAST TECHNIQUE: Multidetector CT imaging of the chest was performed using the standard protocol during bolus  administration of intravenous contrast. Multiplanar CT image reconstructions and MIPs were obtained to evaluate the vascular anatomy. Please note that the lowermost portion of the lungs were inadvertently excluded on the initial angiographic sequence, and delayed images of the lower lungs were subsequently obtained. CONTRAST:  100 cc Isovue 370 IV. COMPARISON:  05/08/2016 chest radiograph. No prior chest CT. 08/14/2015 CT abdomen/pelvis. FINDINGS: Mediastinum/Nodes: The study is high quality for the evaluation of pulmonary embolism. There are no filling defects in the central, lobar, segmental or subsegmental pulmonary artery branches to suggest acute pulmonary embolism. Mildly atherosclerotic nonaneurysmal thoracic aorta. Normal caliber pulmonary arteries. Normal heart size. Left anterior descending coronary atherosclerosis. Stable trace pericardial fluid/thickening. No discrete thyroid nodules. Unremarkable esophagus. No pathologically enlarged axillary, mediastinal or hilar lymph nodes. Lungs/Pleura: No pneumothorax. No pleural effusion. No acute consolidative airspace disease, significant pulmonary nodules or lung masses. Upper abdomen: There are scattered simple cysts in the visualized liver measuring up to the 2.4 cm. Additional subcentimeter hypodense lesions scattered throughout the liver are too small to characterize and are not appreciably changed, suggesting benign lesions. Simple 1.8 cm cystic lesion in the lateral upper spleen is minimally increased from 1.6 cm on 08/14/2015, suggestive of a benign lesion such as a lymphangioma. Musculoskeletal: No aggressive appearing focal osseous lesions. Mild thoracic spondylosis. Review of the MIP images confirms the above findings. IMPRESSION: 1. No pulmonary embolism. 2. No active pulmonary disease. 3. One vessel coronary atherosclerosis. Electronically Signed   By: Ilona Sorrel M.D.   On: 05/08/2016 13:55   I have personally reviewed and evaluated these images  and lab results as part of my medical decision-making.   EKG Interpretation   Date/Time:  Saturday May 08 2016 10:52:12 EDT Ventricular Rate:  79 PR Interval:    QRS Duration: 97 QT Interval:  466 QTC Calculation: 535 R Axis:   0 Text Interpretation:  Sinus rhythm Abnormal R-wave progression, early  transition Left ventricular hypertrophy Borderline T abnormalities,  diffuse leads Prolonged QT interval Confirmed by Lita Mains  MD, Emmalie Haigh  ([id number]) on 05/08/2016 11:10:08 AM      MDM   Final diagnoses:  Atypical chest pain  Right-sided thoracic back pain   Patient is comfortable. Symptoms have improved. Workup is essentially negative. He has an EKG that is unchanged from previous. Troponin 2 are normal. Patient did have an elevated d-dimer but CT of the chest angio of the chest without acute findings. Given patient does have a history  of high blood pressure and diabetes who will need to follow up with cardiologist as outpatient. Return precautions given.     Julianne Rice, MD 05/08/16 [phone]

## 2016-05-08 NOTE — ED Notes (Signed)
Pt amb to room 6 with tech, reports chest tightness x 3 days, states he awoke feeling dizzy this morning, and has had chest tightness off and on to his central chest all day. Denies sob, reports right shoulder pain also, but does not feel this is related to his cp.

## 2016-05-08 NOTE — ED Notes (Signed)
Pt and wife given instructions as per chart. Verbalizes understanding. No questions. Rx x 1

## 2016-05-08 NOTE — ED Notes (Signed)
MD at bedside. 

## 2016-05-08 NOTE — Discharge Instructions (Signed)
Back Pain, Adult °Back pain is very common in adults. The cause of back pain is rarely dangerous and the pain often gets better over time. The cause of your back pain may not be known. Some common causes of back pain include: °· Strain of the muscles or ligaments supporting the spine. °· Wear and tear (degeneration) of the spinal disks. °· Arthritis. °· Direct injury to the back. °For many people, back pain may return. Since back pain is rarely dangerous, most people can learn to manage this condition on their own. °HOME CARE INSTRUCTIONS °Watch your back pain for any changes. The following actions may help to lessen any discomfort you are feeling: °· Remain active. It is stressful on your back to sit or stand in one place for long periods of time. Do not sit, drive, or stand in one place for more than 30 minutes at a time. Take short walks on even surfaces as soon as you are able. Try to increase the length of time you walk each day. °· Exercise regularly as directed by your health care provider. Exercise helps your back heal faster. It also helps avoid future injury by keeping your muscles strong and flexible. °· Do not stay in bed. Resting more than 1-2 days can delay your recovery. °· Pay attention to your body when you bend and lift. The most comfortable positions are those that put less stress on your recovering back. Always use proper lifting techniques, including: °· Bending your knees. °· Keeping the load close to your body. °· Avoiding twisting. °· Find a comfortable position to sleep. Use a firm mattress and lie on your side with your knees slightly bent. If you lie on your back, put a pillow under your knees. °· Avoid feeling anxious or stressed. Stress increases muscle tension and can worsen back pain. It is important to recognize when you are anxious or stressed and learn ways to manage it, such as with exercise. °· Take medicines only as directed by your health care provider. Over-the-counter  medicines to reduce pain and inflammation are often the most helpful. Your health care provider may prescribe muscle relaxant drugs. These medicines help dull your pain so you can more quickly return to your normal activities and healthy exercise. °· Apply ice to the injured area: °· Put ice in a plastic bag. °· Place a towel between your skin and the bag. °· Leave the ice on for 20 minutes, 2-3 times a day for the first 2-3 days. After that, ice and heat may be alternated to reduce pain and spasms. °· Maintain a healthy weight. Excess weight puts extra stress on your back and makes it difficult to maintain good posture. °SEEK MEDICAL CARE IF: °· You have pain that is not relieved with rest or medicine. °· You have increasing pain going down into the legs or buttocks. °· You have pain that does not improve in one week. °· You have night pain. °· You lose weight. °· You have a fever or chills. °SEEK IMMEDIATE MEDICAL CARE IF:  °· You develop new bowel or bladder control problems. °· You have unusual weakness or numbness in your arms or legs. °· You develop nausea or vomiting. °· You develop abdominal pain. °· You feel faint. °  °This information is not intended to replace advice given to you by your health care provider. Make sure you discuss any questions you have with your health care provider. °  °Document Released: 11/01/2005 Document Revised: 11/22/2014 Document Reviewed: 03/05/2014 °Elsevier Interactive Patient Education ©2016 Elsevier   Inc. ° °Nonspecific Chest Pain  °Chest pain can be caused by many different conditions. There is always a chance that your pain could be related to something serious, such as a heart attack or a blood clot in your lungs. Chest pain can also be caused by conditions that are not life-threatening. If you have chest pain, it is very important to follow up with your health care provider. °CAUSES  °Chest pain can be caused by: °· Heartburn. °· Pneumonia or bronchitis. °· Anxiety or  stress. °· Inflammation around your heart (pericarditis) or lung (pleuritis or pleurisy). °· A blood clot in your lung. °· A collapsed lung (pneumothorax). It can develop suddenly on its own (spontaneous pneumothorax) or from trauma to the chest. °· Shingles infection (varicella-zoster virus). °· Heart attack. °· Damage to the bones, muscles, and cartilage that make up your chest wall. This can include: °¨ Bruised bones due to injury. °¨ Strained muscles or cartilage due to frequent or repeated coughing or overwork. °¨ Fracture to one or more ribs. °¨ Sore cartilage due to inflammation (costochondritis). °RISK FACTORS  °Risk factors for chest pain may include: °· Activities that increase your risk for trauma or injury to your chest. °· Respiratory infections or conditions that cause frequent coughing. °· Medical conditions or overeating that can cause heartburn. °· Heart disease or family history of heart disease. °· Conditions or health behaviors that increase your risk of developing a blood clot. °· Having had chicken pox (varicella zoster). °SIGNS AND SYMPTOMS °Chest pain can feel like: °· Burning or tingling on the surface of your chest or deep in your chest. °· Crushing, pressure, aching, or squeezing pain. °· Dull or sharp pain that is worse when you move, cough, or take a deep breath. °· Pain that is also felt in your back, neck, shoulder, or arm, or pain that spreads to any of these areas. °Your chest pain may come and go, or it may stay constant. °DIAGNOSIS °Lab tests or other studies may be needed to find the cause of your pain. Your health care provider may have you take a test called an ambulatory ECG (electrocardiogram). An ECG records your heartbeat patterns at the time the test is performed. You may also have other tests, such as: °· Transthoracic echocardiogram (TTE). During echocardiography, sound waves are used to create a picture of all of the heart structures and to look at how blood flows  through your heart. °· Transesophageal echocardiogram (TEE). This is a more advanced imaging test that obtains images from inside your body. It allows your health care provider to see your heart in finer detail. °· Cardiac monitoring. This allows your health care provider to monitor your heart rate and rhythm in real time. °· Holter monitor. This is a portable device that records your heartbeat and can help to diagnose abnormal heartbeats. It allows your health care provider to track your heart activity for several days, if needed. °· Stress tests. These can be done through exercise or by taking medicine that makes your heart beat more quickly. °· Blood tests. °· Imaging tests. °TREATMENT  °Your treatment depends on what is causing your chest pain. Treatment may include: °· Medicines. These may include: °¨ Acid blockers for heartburn. °¨ Anti-inflammatory medicine. °¨ Pain medicine for inflammatory conditions. °¨ Antibiotic medicine, if an infection is present. °¨ Medicines to dissolve blood clots. °¨ Medicines to treat coronary artery disease. °· Supportive care for conditions that do not require medicines. This may include: °¨ Resting. °¨   Applying heat or cold packs to injured areas. °¨ Limiting activities until pain decreases. °HOME CARE INSTRUCTIONS °· If you were prescribed an antibiotic medicine, finish it all even if you start to feel better. °· Avoid any activities that bring on chest pain. °· Do not use any tobacco products, including cigarettes, chewing tobacco, or electronic cigarettes. If you need help quitting, ask your health care provider. °· Do not drink alcohol. °· Take medicines only as directed by your health care provider. °· Keep all follow-up visits as directed by your health care provider. This is important. This includes any further testing if your chest pain does not go away. °· If heartburn is the cause for your chest pain, you may be told to keep your head raised (elevated) while sleeping.  This reduces the chance that acid will go from your stomach into your esophagus. °· Make lifestyle changes as directed by your health care provider. These may include: °¨ Getting regular exercise. Ask your health care provider to suggest some activities that are safe for you. °¨ Eating a heart-healthy diet. A registered dietitian can help you to learn healthy eating options. °¨ Maintaining a healthy weight. °¨ Managing diabetes, if necessary. °¨ Reducing stress. °SEEK MEDICAL CARE IF: °· Your chest pain does not go away after treatment. °· You have a rash with blisters on your chest. °· You have a fever. °SEEK IMMEDIATE MEDICAL CARE IF:  °· Your chest pain is worse. °· You have an increasing cough, or you cough up blood. °· You have severe abdominal pain. °· You have severe weakness. °· You faint. °· You have chills. °· You have sudden, unexplained chest discomfort. °· You have sudden, unexplained discomfort in your arms, back, neck, or jaw. °· You have shortness of breath at any time. °· You suddenly start to sweat, or your skin gets clammy. °· You feel nauseous or you vomit. °· You suddenly feel light-headed or dizzy. °· Your heart begins to beat quickly, or it feels like it is skipping beats. °These symptoms may represent a serious problem that is an emergency. Do not wait to see if the symptoms will go away. Get medical help right away. Call your local emergency services (911 in the [place] Do not drive yourself to the hospital. °  °This information is not intended to replace advice given to you by your health care provider. Make sure you discuss any questions you have with your health care provider. °  °Document Released: 08/11/2005 Document Revised: 11/22/2014 Document Reviewed: 06/07/2014 °Elsevier Interactive Patient Education ©2016 Elsevier Inc. ° °

## 2016-05-24 ENCOUNTER — Other Ambulatory Visit: Payer: Self-pay | Admitting: *Deleted

## 2016-05-24 NOTE — Telephone Encounter (Signed)
Pharmacy requesting a refill on atorvastatin from Dr Irish Lack. Looks like patient has been seeing Dr Harrington Challenger, but I do not see a recent lipid panel from this office. Please advise. Thanks, MI

## 2016-05-25 ENCOUNTER — Other Ambulatory Visit: Payer: Self-pay | Admitting: *Deleted

## 2016-05-25 MED ORDER — ATORVASTATIN CALCIUM 10 MG PO TABS: 10.0000 mg | ORAL_TABLET | Freq: Every day | ORAL | Status: DC

## 2016-05-27 NOTE — Telephone Encounter (Signed)
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 He did have lipids in February, ok to refill but needs an appointment soon. I sent a message to scheduling to call him.

## 2016-07-13 ENCOUNTER — Encounter: Payer: Self-pay | Admitting: Pulmonary Disease

## 2016-07-13 ENCOUNTER — Ambulatory Visit (INDEPENDENT_AMBULATORY_CARE_PROVIDER_SITE_OTHER): Payer: Medicare Other | Admitting: Pulmonary Disease

## 2016-07-13 VITALS — BP 122/68 | HR 87 | Ht 69.25 in | Wt 290.0 lb

## 2016-07-13 DIAGNOSIS — Z6841 Body Mass Index (BMI) 40.0 and over, adult: Secondary | ICD-10-CM

## 2016-07-13 DIAGNOSIS — Z23 Encounter for immunization: Secondary | ICD-10-CM

## 2016-07-13 DIAGNOSIS — G4733 Obstructive sleep apnea (adult) (pediatric): Secondary | ICD-10-CM

## 2016-07-13 DIAGNOSIS — Z9989 Dependence on other enabling machines and devices: Principal | ICD-10-CM

## 2016-07-13 NOTE — Progress Notes (Signed)
Current Outpatient Prescriptions on File Prior to Visit  Medication Sig  . allopurinol (ZYLOPRIM) 300 MG tablet TAKE 1 TABLET BY MOUTH DAILY TO PREVENT GOUT  . amLODipine-benazepril (LOTREL) 10-20 MG capsule TAKE 1 CAPSULE BY MOUTH EVERY DAY  . aspirin 81 MG tablet Take 81 mg by mouth daily.  Marland Kitchen atorvastatin (LIPITOR) 10 MG tablet Take 1 tablet (10 mg total) by mouth daily. PLEASE SCHEDULE FOLLOW UP  . esomeprazole (NEXIUM) 40 MG capsule TAKE ONE CAPSULE BY MOUTH EVERY DAY  . FeFum-FePoly-FA-B Cmp-C-Biot (INTEGRA PLUS) CAPS TAKE 1 CAPSULE BY MOUTH EVERY MORNING.  . ferrous sulfate (FE TABS) 325 (65 FE) MG EC tablet Take 1 tablet (325 mg total) by mouth daily with breakfast.  . methocarbamol (ROBAXIN) 500 MG tablet Take 1 tablet (500 mg total) by mouth every 8 (eight) hours as needed for muscle spasms.  . nitroGLYCERIN (NITROSTAT) 0.4 MG SL tablet Place 0.4 mg under the tongue every 5 (five) minutes as needed for chest pain. Reported on 03/04/2016  . TRADJENTA 5 MG TABS tablet TAKE 1 TABLET BY MOUTH EVERY DAY  . triamterene-hydrochlorothiazide (MAXZIDE-25) 37.5-25 MG tablet Take 1 tablet by mouth daily.   No current facility-administered medications on file prior to visit.      Chief Complaint  Patient presents with  . Follow-up    Pt doing well. He uses CPAP nightly and averages 6-7 hours of sleep. He denies any co's regarding machine or mask. Feels well rested.      Sleep tests PSG 10/19/07 >> AHI 29 CPAP 04/14/16 to 07/12/16 >> used on 87 of 90 nights with average 5 hrs 15 min.  Average AHI 4.8 with CPAP 10 cm H2O  Cardiac tests Echo 08/27/11 >> EF 55 to 65%, mild MR, PAS 32 mmHg  Past medical hx CAD, HTN, HLD, Diverticulosis, GERD, HH, Prostate cancer, DM, Gout,   Past surgical hx, Allergies, Family hx, Social hx all reviewed.  Vital Signs BP 122/68 (BP Location: Left Arm, Cuff Size: Normal)   Pulse 87   Ht 5' 9.25" (1.759 m)   Wt 290 lb (131.5 kg)   SpO2 97%   BMI 42.52  kg/m   History of Present Illness Leonard [name]. is a 70 y.o. male with obstructive sleep apnea.  He uses CPAP about 5 to 6 hours per night.  No issues with mask fit.  Helps sleep.  Feels rested.  Physical Exam  General - No distress ENT - No sinus tenderness, no oral exudate, no LAN Cardiac - s1s2 regular, no murmur Chest - No wheeze/rales/dullness Back - No focal tenderness Abd - Soft, non-tender Ext - No edema Neuro - Normal strength Skin - No rashes Psych - normal mood, and behavior   Assessment/Plan  Obstructive sleep apnea. - he is compliant with CPAP and reports benefit  - continue CPAP 10 cm H2O  Obesity. - discussed importance of weight loss   Patient Instructions  Flu shot today  Follow up in 1 year    Chesley Mires, MD [facility] Pager:  [phone] 07/13/2016, 12:42 PM

## 2016-07-13 NOTE — Patient Instructions (Signed)
Flu shot today Follow up in 1 year 

## 2016-07-15 ENCOUNTER — Other Ambulatory Visit: Payer: Self-pay

## 2016-07-15 MED ORDER — ESOMEPRAZOLE MAGNESIUM 40 MG PO CPDR: 40.0000 mg | DELAYED_RELEASE_CAPSULE | Freq: Every day | ORAL | 3 refills | Status: DC

## 2016-07-29 DIAGNOSIS — C61 Malignant neoplasm of prostate: Secondary | ICD-10-CM | POA: Diagnosis not present

## 2016-07-29 DIAGNOSIS — N393 Stress incontinence (female) (male): Secondary | ICD-10-CM | POA: Diagnosis not present

## 2016-07-29 DIAGNOSIS — E291 Testicular hypofunction: Secondary | ICD-10-CM | POA: Diagnosis not present

## 2016-07-29 DIAGNOSIS — N5201 Erectile dysfunction due to arterial insufficiency: Secondary | ICD-10-CM | POA: Diagnosis not present

## 2016-08-13 ENCOUNTER — Encounter: Payer: Self-pay | Admitting: Internal Medicine

## 2016-08-19 ENCOUNTER — Other Ambulatory Visit: Payer: Federal, State, Local not specified - PPO

## 2016-08-22 ENCOUNTER — Other Ambulatory Visit: Payer: Self-pay | Admitting: Interventional Cardiology

## 2016-08-24 ENCOUNTER — Telehealth: Payer: Self-pay | Admitting: Internal Medicine

## 2016-08-24 NOTE — Telephone Encounter (Signed)
MM PAL - MOVE 10/12 F/U TO 10/30. SPOKE WITH PATIENT HE IS AWARE. PATIENT WILL ALSO COME IN FOR MISSED 10/5 LAB ON 10/13.

## 2016-08-26 ENCOUNTER — Other Ambulatory Visit: Payer: Federal, State, Local not specified - PPO

## 2016-08-26 ENCOUNTER — Ambulatory Visit: Payer: Federal, State, Local not specified - PPO | Admitting: Internal Medicine

## 2016-08-27 ENCOUNTER — Other Ambulatory Visit: Payer: Federal, State, Local not specified - PPO

## 2016-08-30 ENCOUNTER — Encounter: Payer: Self-pay | Admitting: Internal Medicine

## 2016-08-30 ENCOUNTER — Ambulatory Visit (INDEPENDENT_AMBULATORY_CARE_PROVIDER_SITE_OTHER): Payer: Medicare Other | Admitting: Internal Medicine

## 2016-08-30 VITALS — BP 134/76 | HR 79 | Ht 69.25 in | Wt 294.0 lb

## 2016-08-30 DIAGNOSIS — I10 Essential (primary) hypertension: Secondary | ICD-10-CM | POA: Diagnosis not present

## 2016-08-30 DIAGNOSIS — I25810 Atherosclerosis of coronary artery bypass graft(s) without angina pectoris: Secondary | ICD-10-CM | POA: Diagnosis not present

## 2016-08-30 NOTE — Patient Instructions (Signed)
Your physician recommends that you continue on your current medications as directed. Please refer to the Current Medication list given to you today. Your physician wants you to follow-up in: 1 year with Dr. Ross.  You will receive a reminder letter in the mail two months in advance. If you don't receive a letter, please call our office to schedule the follow-up appointment.  

## 2016-08-30 NOTE — Progress Notes (Signed)
Cardiology Office Note   Date:  08/30/2016   ID:  Leonard [name]., DOB [birth year], MRN [id number]  PCP:  [name], MD  Cardiologist:   Dorris Carnes, MD    F?U of CAD     History of Present Illness: Odes Pint. is a 70 y.o. male with a history of  HTN, minimal CAD at cath in  2007, sleep apnea, MGUS, prostate CA  and HL  I saw him in May 2015      Pt seen by Sundra Aland once for BP He is followed in pulm and onc clinic  BP 120s to 150s    Breathing good   Going to gym Started last week again No CP  No dizziness      Outpatient Medications Prior to Visit  Medication Sig Dispense Refill  . allopurinol (ZYLOPRIM) 300 MG tablet TAKE 1 TABLET BY MOUTH DAILY TO PREVENT GOUT 90 tablet 3  . amLODipine-benazepril (LOTREL) 10-20 MG capsule TAKE 1 CAPSULE BY MOUTH EVERY DAY 90 capsule 3  . aspirin 81 MG tablet Take 81 mg by mouth daily.    Marland Kitchen atorvastatin (LIPITOR) 10 MG tablet Take 1 tablet (10 mg total) by mouth daily. 90 tablet 0  . esomeprazole (NEXIUM) 40 MG capsule Take 1 capsule (40 mg total) by mouth daily. 90 capsule 3  . FeFum-FePoly-FA-B Cmp-C-Biot (INTEGRA PLUS) CAPS TAKE 1 CAPSULE BY MOUTH EVERY MORNING. 30 capsule 5  . methocarbamol (ROBAXIN) 500 MG tablet Take 1 tablet (500 mg total) by mouth every 8 (eight) hours as needed for muscle spasms. 30 tablet 0  . nitroGLYCERIN (NITROSTAT) 0.4 MG SL tablet Place 0.4 mg under the tongue every 5 (five) minutes as needed for chest pain. Reported on 03/04/2016    . TRADJENTA 5 MG TABS tablet TAKE 1 TABLET BY MOUTH EVERY DAY 90 tablet 3  . triamterene-hydrochlorothiazide (MAXZIDE-25) 37.5-25 MG tablet Take 1 tablet by mouth daily. 30 tablet 11  . ferrous sulfate (FE TABS) 325 (65 FE) MG EC tablet Take 1 tablet (325 mg total) by mouth daily with breakfast. (Patient not taking: Reported on 08/30/2016) 30 tablet 3   No facility-administered medications prior to visit.      Allergies:   Dicloxacillin; Cayenne pepper  [cayenne]; and Nsaids   Past Medical History:  Diagnosis Date  . 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 Coronary artery disease   . Diverticulosis of colon (without mention of hemorrhage) 09/05/2006   Colonoscopy   . Gastric polyp 2014   done at [facility]  . GERD (gastroesophageal reflux disease) 07/26/2001   EGD(Chronic)  . Gouty arthropathy   . Hemorrhoids   . Hiatal hernia 09/05/2006   EGD  . Hx of colonic polyps 09/05/2006   Colonoscopy(ADENOMATOUS POLYP)  . Iron deficiency anemia, unspecified   . OSA (obstructive sleep apnea)    USES CPAP MACHINE   . Other and unspecified hyperlipidemia   . Prostate cancer ([place]) 2015  . Type II or unspecified type diabetes mellitus without mention of complication, not stated as uncontrolled   . Unspecified essential hypertension     Past Surgical History:  Procedure Laterality Date  . ESOPHAGOGASTRODUODENOSCOPY     Done at [facility]  . GI PROSTATE BIOPSY    . HEMORRHOID SURGERY    . INCISIONAL HERNIA REPAIR N/A 08/07/2015   Procedure: LAPAROSCOPIC INCISIONAL HERNIA repair with mesh;  Surgeon: Arta Bruce Kinsinger, MD;  Location: [facility];  Service: General;  Laterality: N/A;  . KNEE SURGERY  1999   TUMOR REMOVED LEFT KNEE & ARTHROSCOPIC SURGERY  . LYMPHADENECTOMY Bilateral 05/01/2014   Procedure: LYMPHADENECTOMY;  Surgeon: Alexis Frock, MD;  Location: [facility];  Service: Urology;  Laterality: Bilateral;  . ROBOT ASSISTED LAPAROSCOPIC RADICAL PROSTATECTOMY N/A 05/01/2014   Procedure: ROBOTIC ASSISTED LAPAROSCOPIC RADICAL PROSTATECTOMY WITH INDOCYANINE GREEN DYE;  Surgeon: Alexis Frock, MD;  Location: [facility];  Service: Urology;  Laterality: N/A;     Social History:  The patient  reports that he has never smoked. He has never used smokeless tobacco. He reports that he does not drink alcohol or use drugs.   Family History:  The patient's family history includes Diabetes in his brother and mother; Heart attack in his  father.    ROS:  Please see the history of present illness. All other systems are reviewed and  Negative to the above problem except as noted.    PHYSICAL EXAM: VS:  BP 134/76   Pulse 79   Ht 5' 9.25" (1.759 m)   Wt 294 lb (133.4 kg)   SpO2 97%   BMI 43.10 kg/m   GEN: Well nourished, well developed, in no acute distress HEENT: normal Neck: no JVD, carotid bruits, or masses Cardiac: RRR; no murmurs, rubs, or gallops,no edema  Respiratory:  clear to auscultation bilaterally, normal work of breathing GI: soft, nontender, nondistended, + BS  No hepatomegaly  MS: no deformity Moving all extremities   Skin: warm and dry, no rash Neuro:  Strength and sensation are intact Psych: euthymic mood, full affect   EKG:  EKG is not ordered today.   Lipid Panel    Component Value Date/Time   CHOL 178 12/25/2015 0911   TRIG 67 12/25/2015 0911   HDL 73 12/25/2015 0911   CHOLHDL 2.4 12/25/2015 0911   VLDL 13 12/25/2015 0911   LDLCALC 92 12/25/2015 0911      Wt Readings from Last 3 Encounters:  08/30/16 294 lb (133.4 kg)  07/13/16 290 lb (131.5 kg)  05/08/16 286 lb (129.7 kg)      ASSESSMENT AND PLAN:  1  CAD  No symptoms of angina  2 HTN   BP is good  Keep on same meds  3  LIpdis  OK  LDL 92  Watch sat fats  4  Exercise  Encouraged to keep it up    F/u 1 year      Current medicines are reviewed at length with the patient today.  The patient does not have concerns regarding medicines.  Signed, Dorris Carnes, MD  08/30/2016 11:02 AM    [facility] [address], [place], [place]  [address] Phone: [phone]; Fax: [phone]

## 2016-09-04 IMAGING — CT CT ABD-PELV W/ CM
2 of 5 series · 15 of 46 positions shown, 17 images · IV contrast (OMNIPAQUE)
Comparison: CT the abdomen and pelvis 08/06/2015.

CLINICAL DATA: 68-year-old male with history of leukocytosis 1 week
status post laparoscopic incisional hernia repair and placement of
mesh.

EXAM:
CT ABDOMEN AND PELVIS WITH CONTRAST
TECHNIQUE: Multidetector CT imaging of the abdomen and pelvis was performed
using the standard protocol following bolus administration of
intravenous contrast.
CONTRAST:  100mL OMNIPAQUE IOHEXOL 300 MG/ML  SOLN

[Series 2: rtn a/p with · axial · 0.84mm/px · z∈[-583,-138]mm · 12 of 101 slices shown, 14 images]
[im 6/101  soft-tissue]
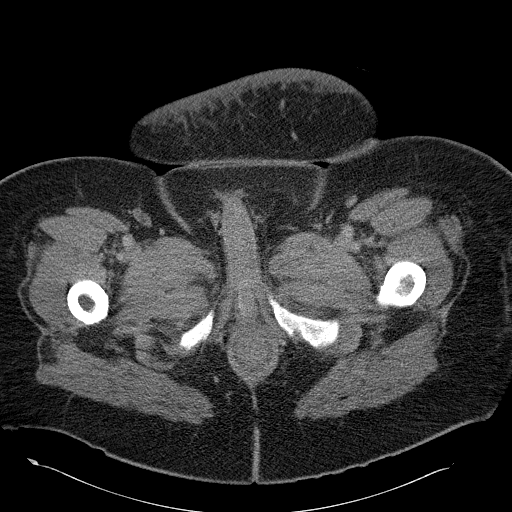
[im 6/101  bone]
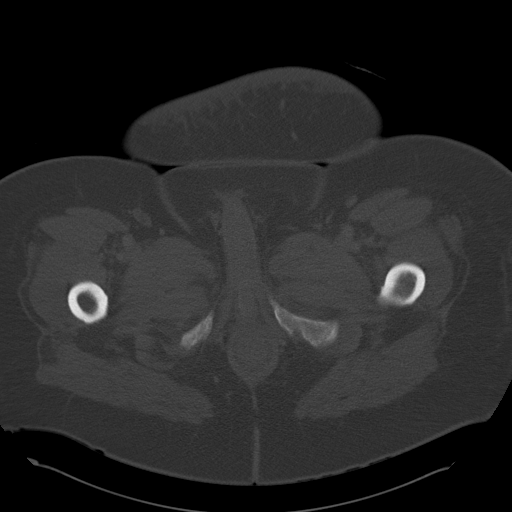
[im 16/101  soft-tissue]
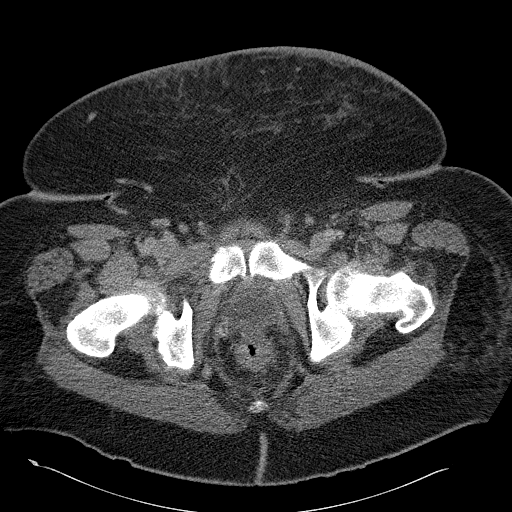
[im 22/101  soft-tissue]
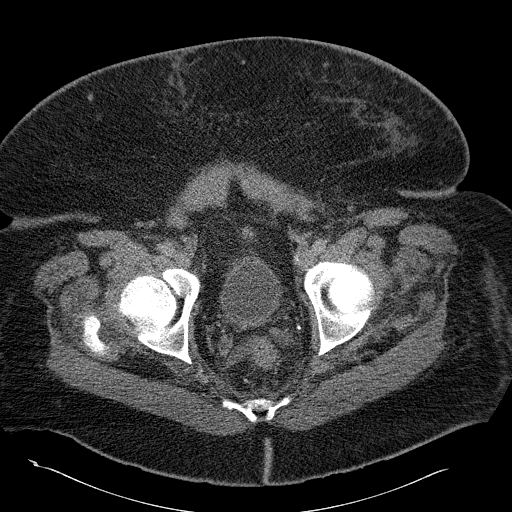
[im 32/101  soft-tissue]
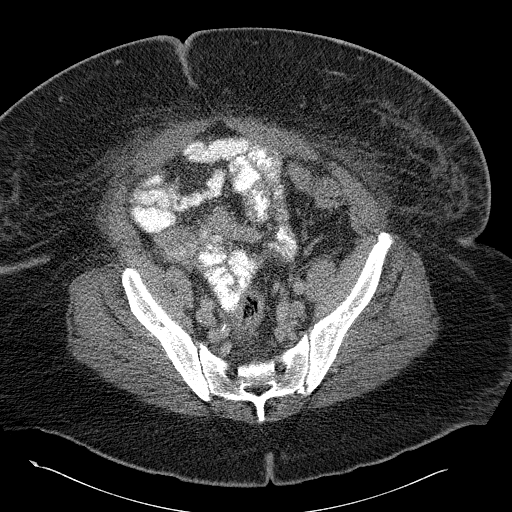
[im 37/101  soft-tissue]
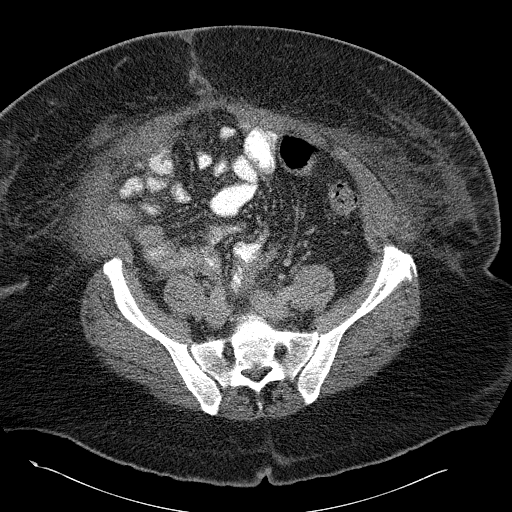
[im 48/101  soft-tissue]
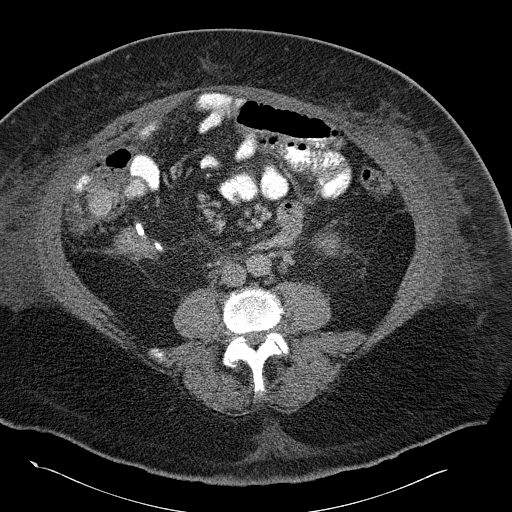
[im 53/101  soft-tissue]
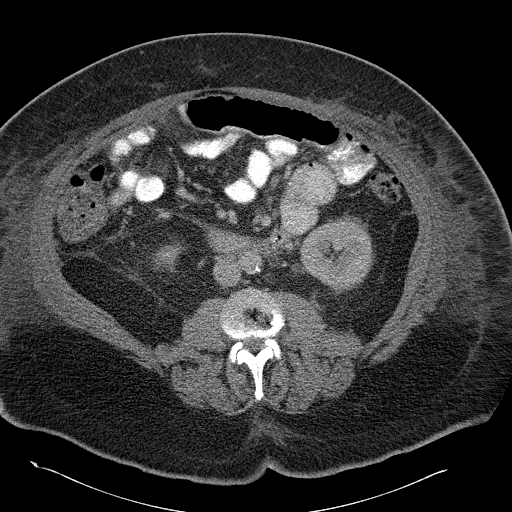
[im 64/101  soft-tissue]
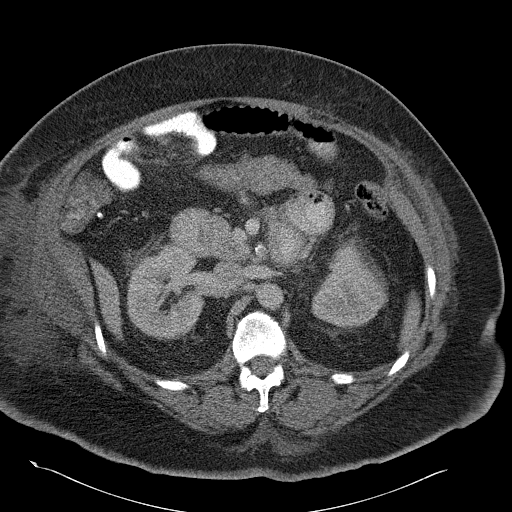
[im 69/101  soft-tissue]
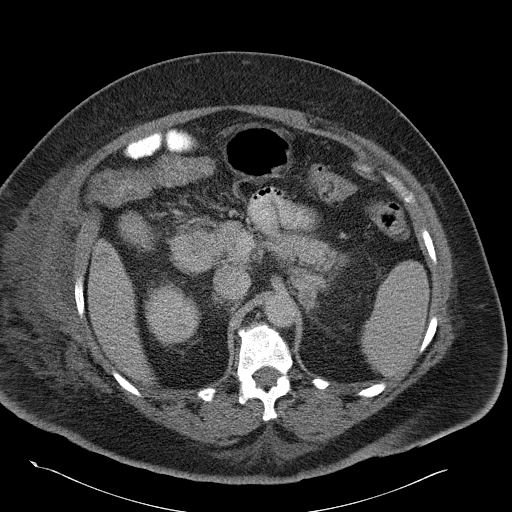
[im 69/101  bone]
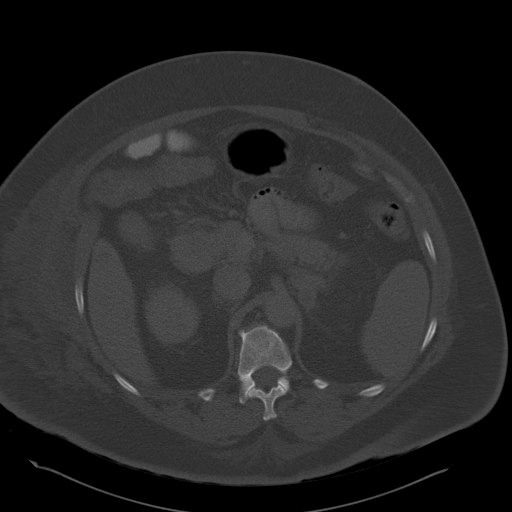
[im 79/101  soft-tissue]
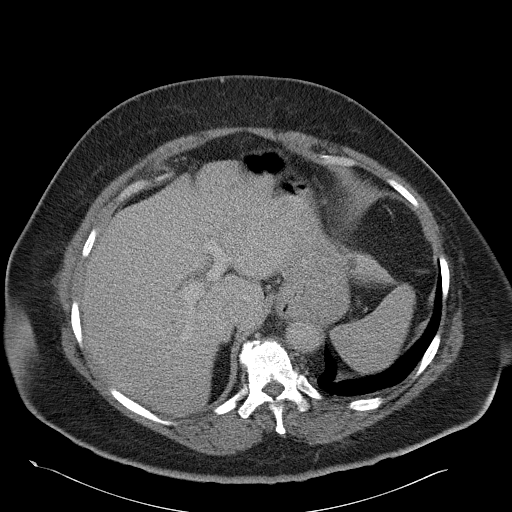
[im 85/101  soft-tissue]
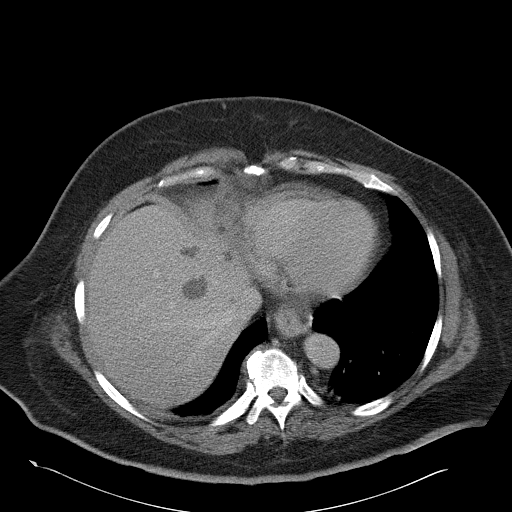
[im 95/101  soft-tissue]
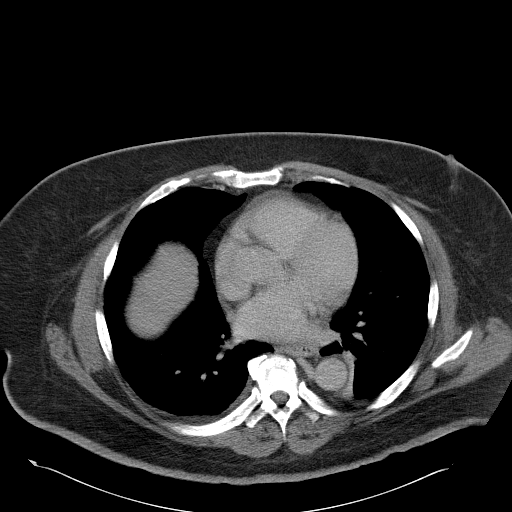

[Series 602: <mpr thick range> · coronal · 0.98mm/px · 3 of 125 slices shown]
[im 42/125  soft-tissue]
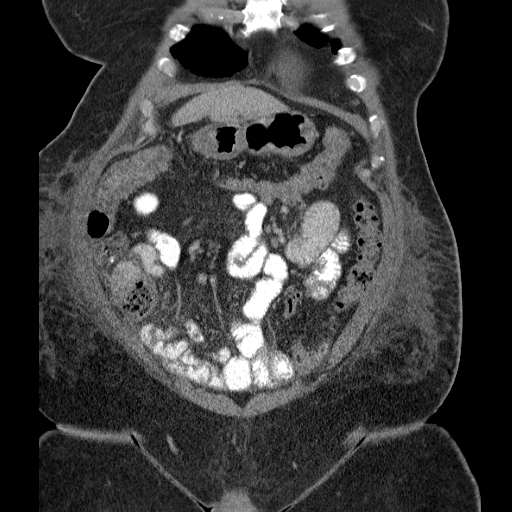
[im 56/125  soft-tissue]
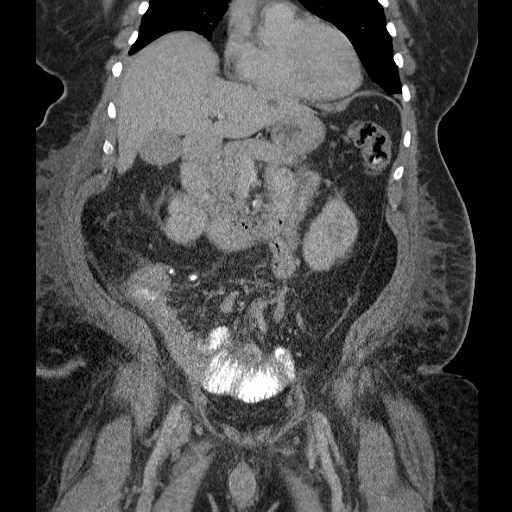
[im 69/125  soft-tissue]
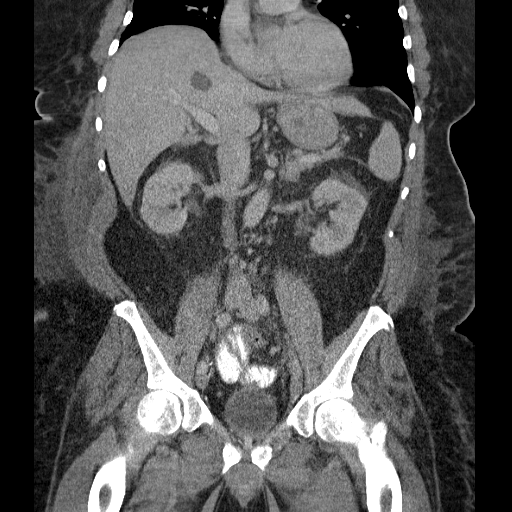

[15 of 46 positions shown; findings below may reference images not displayed]

FINDINGS: Lower chest: Subsegmental atelectasis in the left lower lobe. Small
amount of anterior pericardial fluid and/or thickening, unlikely to
be of any hemodynamic significance at this time. No associated
pericardial calcification.

Hepatobiliary: Multiple well-defined low-attenuation lesions in the
liver, the largest of which are compatible with simple cysts,
measuring up to 2.2 cm in diameter in the central aspect of segment
8 of the liver. The smaller hepatic lesions are too small to
definitively characterize, but are similar in size, number and
pattern of distribution compared to the prior examinations, likely
small cysts. No intra or extrahepatic biliary ductal dilatation.

Pancreas: No pancreatic mass. No pancreatic ductal dilatation. No
pancreatic or peripancreatic fluid or inflammatory changes.

Spleen: Subcapsular 1.3 cm low-attenuation lesion associated with
the spleen, incompletely characterized on today's examination but
similar to prior studies, likely a benign lesion such as a cyst.

Adrenals/Urinary Tract: Nodular thickening of the left adrenal gland
is similar to the prior examination. Right adrenal gland is normal
in appearance. Bilateral kidneys are normal in appearance. Mild
bilateral perinephric stranding (nonspecific). No
hydroureteronephrosis to indicate urinary tract obstruction at this
time. Urinary bladder is normal in appearance.

Stomach/Bowel: The appearance of the stomach is normal. No
pathologic dilatation of small bowel or colon. A few scattered
colonic diverticulae are noted, without surrounding inflammatory
changes to suggest an acute diverticulitis at this time. Normal
appendix. Previously noted right-sided Spigelian hernia is no longer
identified, although there is a small amount of fluid immediately
superficial to the site of the prior hernia, which may represent a
small postoperative fluid collection such as a seroma measuring
approximately 2.0 x 4.2 cm (image 63 of series 2).

Vascular/Lymphatic: Atherosclerotic calcifications throughout the
abdominal and pelvic vasculature. No aneurysm or dissection. No
lymphadenopathy noted in the abdomen or pelvis.

Reproductive: Status post radical prostatectomy.

Other: Diffuse mesenteric edema. Trace volume of ascites. No
pneumoperitoneum. Diffuse body wall edema.

Musculoskeletal: There are no aggressive appearing lytic or blastic
lesions noted in the visualized portions of the skeleton.
IMPRESSION: 1. Previously noted Spigelian hernia appears to have been closed,
and there is a small postoperative fluid collection superficial to
the site of hernia repair in the right anterior abdominal wall,
likely to represent a postoperative seroma.
2. Small-bowel obstruction noted on the prior study has resolved.
3. No definite source tree leukocytosis confidently identified on
today's examination.
4. Mild diffuse mesenteric edema, trace volume of ascites, trace
amount of pericardial fluid and/or thickening and diffuse body wall
edema, suggesting a state of anasarca.
5. Additional incidental findings, as above, similar to the prior
study.

## 2016-09-09 ENCOUNTER — Other Ambulatory Visit: Payer: Self-pay | Admitting: Medical Oncology

## 2016-09-09 DIAGNOSIS — D649 Anemia, unspecified: Secondary | ICD-10-CM

## 2016-09-09 MED ORDER — INTEGRA PLUS PO CAPS: 1.0000 | ORAL_CAPSULE | Freq: Every morning | ORAL | 2 refills | Status: DC

## 2016-09-10 ENCOUNTER — Telehealth: Payer: Self-pay | Admitting: *Deleted

## 2016-09-10 ENCOUNTER — Ambulatory Visit (HOSPITAL_BASED_OUTPATIENT_CLINIC_OR_DEPARTMENT_OTHER): Payer: Medicare Other

## 2016-09-10 DIAGNOSIS — D472 Monoclonal gammopathy: Secondary | ICD-10-CM

## 2016-09-10 DIAGNOSIS — D509 Iron deficiency anemia, unspecified: Secondary | ICD-10-CM

## 2016-09-10 LAB — CBC WITH DIFFERENTIAL/PLATELET
BASO%: 0.5 % (ref 0.0–2.0)
BASOS ABS: 0.1 10*3/uL (ref 0.0–0.1)
EOS ABS: 0.6 10*3/uL — AB (ref 0.0–0.5)
EOS%: 5.1 % (ref 0.0–7.0)
HEMATOCRIT: 37.5 % — AB (ref 38.4–49.9)
HGB: 12.2 g/dL — ABNORMAL LOW (ref 13.0–17.1)
LYMPH#: 3.7 10*3/uL — AB (ref 0.9–3.3)
LYMPH%: 30.1 % (ref 14.0–49.0)
MCH: 31 pg (ref 27.2–33.4)
MCHC: 32.6 g/dL (ref 32.0–36.0)
MCV: 95.4 fL (ref 79.3–98.0)
MONO#: 0.7 10*3/uL (ref 0.1–0.9)
MONO%: 6.1 % (ref 0.0–14.0)
NEUT#: 7 10*3/uL — ABNORMAL HIGH (ref 1.5–6.5)
NEUT%: 58.2 % (ref 39.0–75.0)
Platelets: 284 10*3/uL (ref 140–400)
RBC: 3.93 10*6/uL — ABNORMAL LOW (ref 4.20–5.82)
RDW: 15 % — ABNORMAL HIGH (ref 11.0–14.6)
WBC: 12.1 10*3/uL — ABNORMAL HIGH (ref 4.0–10.3)

## 2016-09-10 NOTE — Telephone Encounter (Signed)
Pt came by office earlier today & was unable to speak with him at that time.  Pt left ph # to call back.  He needs labs that were missed on 08/27/16 for appt 09/13/16 with Dr Julien Nordmann.  Order placed for labs today @ 3-3:15 pm.

## 2016-09-13 ENCOUNTER — Encounter: Payer: Self-pay | Admitting: Internal Medicine

## 2016-09-13 ENCOUNTER — Ambulatory Visit (HOSPITAL_BASED_OUTPATIENT_CLINIC_OR_DEPARTMENT_OTHER): Payer: Medicare Other | Admitting: Internal Medicine

## 2016-09-13 ENCOUNTER — Telehealth: Payer: Self-pay | Admitting: Internal Medicine

## 2016-09-13 ENCOUNTER — Other Ambulatory Visit: Payer: Self-pay | Admitting: Medical Oncology

## 2016-09-13 VITALS — BP 142/64 | HR 77 | Temp 97.9°F | Resp 18 | Ht 69.25 in | Wt 289.3 lb

## 2016-09-13 DIAGNOSIS — C61 Malignant neoplasm of prostate: Secondary | ICD-10-CM

## 2016-09-13 DIAGNOSIS — D472 Monoclonal gammopathy: Secondary | ICD-10-CM | POA: Diagnosis not present

## 2016-09-13 DIAGNOSIS — D509 Iron deficiency anemia, unspecified: Secondary | ICD-10-CM

## 2016-09-13 DIAGNOSIS — D649 Anemia, unspecified: Secondary | ICD-10-CM

## 2016-09-13 DIAGNOSIS — D508 Other iron deficiency anemias: Secondary | ICD-10-CM

## 2016-09-13 LAB — IRON AND TIBC
%SAT: 15 % — ABNORMAL LOW (ref 20–55)
Iron: 42 ug/dL (ref 42–163)
TIBC: 290 ug/dL (ref 202–409)
UIBC: 248 ug/dL (ref 117–376)

## 2016-09-13 LAB — FERRITIN: FERRITIN: 110 ng/mL (ref 22–316)

## 2016-09-13 LAB — KAPPA/LAMBDA LIGHT CHAINS
IG KAPPA FREE LIGHT CHAIN: 35.1 mg/L — AB (ref 3.3–19.4)
Ig Lambda Free Light Chain: 20 mg/L (ref 5.7–26.3)
Kappa/Lambda FluidC Ratio: 1.76 — ABNORMAL HIGH (ref 0.26–1.65)

## 2016-09-13 MED ORDER — INTEGRA PLUS PO CAPS: 1.0000 | ORAL_CAPSULE | Freq: Every morning | ORAL | 1 refills | Status: DC

## 2016-09-13 NOTE — Progress Notes (Signed)
[place] [phone]   [phone]) [phone]  OFFICE PROGRESS NOTE  [name], MD [address] [place] [id number]  DIAGNOSIS:  1) monoclonal gammopathy of undetermined significance.  2) iron deficiency anemia.  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 Tresa Moore.   PRIOR THERAPY: Feraheme infusion x 2 doses, last dose was given in October of 2014.  CURRENT THERAPY: Over-the-counter ferrous sulfate.  INTERVAL HISTORY: Nicole Mcgovern. 70 y.o. male returns to the clinic today for 6 months follow up visit. He is feeling very well today with no significant fatigue or weakness. He has no significant change since his last visit. He was started recently on oral iron tablets by Dr. Renold Genta. The patient denied having any significant nausea or vomiting. He denied having any significant chest pain, shortness of breath, cough or hemoptysis. He has a recent lab work including CBC, iron study and ferritin as well as myeloma panel and he is here for evaluation and discussion of his lab results.  MEDICAL HISTORY: Past Medical History:  Diagnosis Date  . 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 Coronary artery disease   . Diverticulosis of colon (without mention of hemorrhage) 09/05/2006   Colonoscopy   . Gastric polyp 2014   done at [facility]  . GERD (gastroesophageal reflux disease) 07/26/2001   EGD(Chronic)  . Gouty arthropathy   . Hemorrhoids   . Hiatal hernia 09/05/2006   EGD  . Hx of colonic polyps 09/05/2006   Colonoscopy(ADENOMATOUS POLYP)  . Iron deficiency anemia, unspecified   . OSA (obstructive sleep apnea)    USES CPAP MACHINE   . Other and unspecified hyperlipidemia   . Prostate cancer ([place]) 2015  . Type II or unspecified type diabetes mellitus without  mention of complication, not stated as uncontrolled   . Unspecified essential hypertension     ALLERGIES:  is allergic to dicloxacillin; cayenne pepper [cayenne]; and nsaids.  MEDICATIONS:  Current Outpatient Prescriptions  Medication Sig Dispense Refill  . allopurinol (ZYLOPRIM) 300 MG tablet TAKE 1 TABLET BY MOUTH DAILY TO PREVENT GOUT 90 tablet 3  . amLODipine-benazepril (LOTREL) 10-20 MG capsule TAKE 1 CAPSULE BY MOUTH EVERY DAY 90 capsule 3  . aspirin 81 MG tablet Take 81 mg by mouth daily.    Marland Kitchen atorvastatin (LIPITOR) 10 MG tablet Take 1 tablet (10 mg total) by mouth daily. 90 tablet 0  . esomeprazole (NEXIUM) 40 MG capsule Take 1 capsule (40 mg total) by mouth daily. 90 capsule 3  . methocarbamol (ROBAXIN) 500 MG tablet Take 1 tablet (500 mg total) by mouth every 8 (eight) hours as needed for muscle spasms. 30 tablet 0  . TRADJENTA 5 MG TABS tablet TAKE 1 TABLET BY MOUTH EVERY DAY 90 tablet 3  . triamterene-hydrochlorothiazide (MAXZIDE-25) 37.5-25 MG tablet Take 1 tablet by mouth daily. 30 tablet 11  . FeFum-FePoly-FA-B Cmp-C-Biot (INTEGRA PLUS) CAPS Take 1 capsule by mouth every morning. 90 capsule 1  . nitroGLYCERIN (NITROSTAT) 0.4 MG SL tablet Place 0.4 mg under the tongue every 5 (five) minutes as needed for chest pain. Reported on 03/04/2016     No current facility-administered medications for this visit.     SURGICAL HISTORY:  Past Surgical History:  Procedure Laterality Date  . ESOPHAGOGASTRODUODENOSCOPY     Done at [facility]  .  GI PROSTATE BIOPSY    . HEMORRHOID SURGERY    . INCISIONAL HERNIA REPAIR N/A 08/07/2015   Procedure: LAPAROSCOPIC INCISIONAL HERNIA repair with mesh;  Surgeon: Arta Bruce Kinsinger, MD;  Location: [facility];  Service: General;  Laterality: N/A;  . KNEE SURGERY  1999   TUMOR REMOVED LEFT KNEE & ARTHROSCOPIC SURGERY  . LYMPHADENECTOMY Bilateral 05/01/2014   Procedure: LYMPHADENECTOMY;  Surgeon: Alexis Frock, MD;  Location: [facility];  Service: Urology;   Laterality: Bilateral;  . ROBOT ASSISTED LAPAROSCOPIC RADICAL PROSTATECTOMY N/A 05/01/2014   Procedure: ROBOTIC ASSISTED LAPAROSCOPIC RADICAL PROSTATECTOMY WITH INDOCYANINE GREEN DYE;  Surgeon: Alexis Frock, MD;  Location: [facility];  Service: Urology;  Laterality: N/A;    REVIEW OF SYSTEMS:  A comprehensive review of systems was negative.   PHYSICAL EXAMINATION: General appearance: alert, cooperative, fatigued and no distress Head: Normocephalic, without obvious abnormality, atraumatic Neck: no adenopathy and thyroid not enlarged, symmetric, no tenderness/mass/nodules Lymph nodes: Cervical, supraclavicular, and axillary nodes normal. Resp: clear to auscultation bilaterally Back: symmetric, no curvature. ROM normal. No CVA tenderness. Cardio: regular rate and rhythm, S1, S2 normal, no murmur, click, rub or gallop GI: soft, non-tender; bowel sounds normal; no masses,  no organomegaly Extremities: extremities normal, atraumatic, no cyanosis or edema Neurologic: Alert and oriented X 3, normal strength and tone. Normal symmetric reflexes. Normal coordination and gait  ECOG PERFORMANCE STATUS: 1 - Symptomatic but completely ambulatory  Blood pressure (!) 142/64, pulse 77, temperature 97.9 F (36.6 C), temperature source Oral, resp. rate 18, height 5' 9.25" (1.759 m), weight 289 lb 4.8 oz (131.2 kg), SpO2 97 %.  LABORATORY DATA: Lab Results  Component Value Date   WBC 12.1 (H) 09/10/2016   HGB 12.2 (L) 09/10/2016   HCT 37.5 (L) 09/10/2016   MCV 95.4 09/10/2016   PLT 284 09/10/2016      Chemistry      Component Value Date/Time   NA 140 05/08/2016 1050   NA 139 02/26/2016 1030   K 4.0 05/08/2016 1050   K 3.8 02/26/2016 1030   CL 104 05/08/2016 1050   CL 104 04/17/2013 0857   CO2 27 05/08/2016 1050   CO2 25 02/26/2016 1030   BUN 29 (H) 05/08/2016 1050   BUN 28.3 (H) 02/26/2016 1030   CREATININE 1.38 (H) 05/08/2016 1050   CREATININE 1.3 02/26/2016 1030      Component Value  Date/Time   CALCIUM 9.1 05/08/2016 1050   CALCIUM 9.8 02/26/2016 1030   ALKPHOS 61 05/08/2016 1050   ALKPHOS 65 02/26/2016 1030   AST 19 05/08/2016 1050   AST 16 02/26/2016 1030   ALT 20 05/08/2016 1050   ALT 15 02/26/2016 1030   BILITOT 0.4 05/08/2016 1050   BILITOT 0.48 02/26/2016 1030     Other lab studies:Iron study is still pending  RADIOGRAPHIC STUDIES: No results found.  ASSESSMENT AND PLAN:  1) iron deficiency anemia most likely secondary to history of GI blood loss. He has a stable hemoglobin and hematocrit. Iron study is still pending. I recommended for the patient to continue on oral iron tablets as prescribed by Dr. Renold Genta.  2) Monoclonal gammopathy of undetermined significance: no significant evidence for disease progression. The patient will continue on observation for now.  3) prostate adenocarcinoma status post radical prostatectomy under the care of Dr. Tresa Moore. He will continue to followup with his urologist.  He would come back for follow up visit in 6 months with repeat CBC, iron study, and ferritin The patient was advised  to call immediately if he has any concerning symptoms in the interval.  The patient voices understanding of current disease status and treatment options and is in agreement with the current care plan.  All questions were answered. The patient knows to call the clinic with any problems, questions or concerns. We can certainly see the patient much sooner if necessary.  Disclaimer: This note was dictated with voice recognition software. Similar sounding words can inadvertently be transcribed and may not be corrected upon review.

## 2016-09-13 NOTE — Telephone Encounter (Signed)
Appointments scheduled per 10/30 LOS. AVS report and calendar of next scheduled appointments given to patient.  °

## 2016-10-10 ENCOUNTER — Other Ambulatory Visit: Payer: Self-pay | Admitting: Internal Medicine

## 2016-12-09 ENCOUNTER — Other Ambulatory Visit: Payer: Self-pay | Admitting: Interventional Cardiology

## 2017-01-03 ENCOUNTER — Encounter: Payer: Self-pay | Admitting: Internal Medicine

## 2017-01-03 LAB — HM DIABETES EYE EXAM

## 2017-02-13 ENCOUNTER — Other Ambulatory Visit: Payer: Self-pay | Admitting: Internal Medicine

## 2017-02-14 NOTE — Telephone Encounter (Signed)
Verbal order by Dr. Renold Genta to refill Tradjenta 5mg .  Dispense #90 with 5 refills.  Called and left voicemail at [facility] @ [phone].

## 2017-02-20 ENCOUNTER — Other Ambulatory Visit: Payer: Self-pay | Admitting: Internal Medicine

## 2017-03-09 ENCOUNTER — Other Ambulatory Visit: Payer: Self-pay | Admitting: Internal Medicine

## 2017-03-14 ENCOUNTER — Other Ambulatory Visit (HOSPITAL_BASED_OUTPATIENT_CLINIC_OR_DEPARTMENT_OTHER): Payer: Medicare Other

## 2017-03-14 DIAGNOSIS — D472 Monoclonal gammopathy: Secondary | ICD-10-CM | POA: Diagnosis not present

## 2017-03-14 DIAGNOSIS — D509 Iron deficiency anemia, unspecified: Secondary | ICD-10-CM | POA: Diagnosis present

## 2017-03-14 DIAGNOSIS — D508 Other iron deficiency anemias: Secondary | ICD-10-CM

## 2017-03-14 LAB — CBC WITH DIFFERENTIAL/PLATELET
BASO%: 0.1 % (ref 0.0–2.0)
BASOS ABS: 0 10*3/uL (ref 0.0–0.1)
EOS%: 1.7 % (ref 0.0–7.0)
Eosinophils Absolute: 0.2 10*3/uL (ref 0.0–0.5)
HCT: 36.2 % — ABNORMAL LOW (ref 38.4–49.9)
HEMOGLOBIN: 11.8 g/dL — AB (ref 13.0–17.1)
LYMPH%: 20 % (ref 14.0–49.0)
MCH: 30.9 pg (ref 27.2–33.4)
MCHC: 32.6 g/dL (ref 32.0–36.0)
MCV: 94.8 fL (ref 79.3–98.0)
MONO#: 0.8 10*3/uL (ref 0.1–0.9)
MONO%: 7.7 % (ref 0.0–14.0)
NEUT%: 70.5 % (ref 39.0–75.0)
NEUTROS ABS: 7.3 10*3/uL — AB (ref 1.5–6.5)
Platelets: 252 10*3/uL (ref 140–400)
RBC: 3.82 10*6/uL — AB (ref 4.20–5.82)
RDW: 15.5 % — AB (ref 11.0–14.6)
WBC: 10.3 10*3/uL (ref 4.0–10.3)
lymph#: 2.1 10*3/uL (ref 0.9–3.3)

## 2017-03-14 LAB — IRON AND TIBC
%SAT: 13 % — ABNORMAL LOW (ref 20–55)
Iron: 39 ug/dL — ABNORMAL LOW (ref 42–163)
TIBC: 300 ug/dL (ref 202–409)
UIBC: 261 ug/dL (ref 117–376)

## 2017-03-14 LAB — FERRITIN: FERRITIN: 127 ng/mL (ref 22–316)

## 2017-03-21 ENCOUNTER — Ambulatory Visit (HOSPITAL_BASED_OUTPATIENT_CLINIC_OR_DEPARTMENT_OTHER): Payer: Medicare Other | Admitting: Internal Medicine

## 2017-03-21 ENCOUNTER — Telehealth: Payer: Self-pay | Admitting: Internal Medicine

## 2017-03-21 ENCOUNTER — Encounter: Payer: Self-pay | Admitting: Internal Medicine

## 2017-03-21 VITALS — BP 142/77 | HR 69 | Temp 98.6°F | Resp 18 | Ht 69.0 in | Wt 290.3 lb

## 2017-03-21 DIAGNOSIS — D509 Iron deficiency anemia, unspecified: Secondary | ICD-10-CM

## 2017-03-21 DIAGNOSIS — D472 Monoclonal gammopathy: Secondary | ICD-10-CM | POA: Diagnosis not present

## 2017-03-21 DIAGNOSIS — Z8546 Personal history of malignant neoplasm of prostate: Secondary | ICD-10-CM

## 2017-03-21 DIAGNOSIS — D508 Other iron deficiency anemias: Secondary | ICD-10-CM

## 2017-03-21 NOTE — Progress Notes (Signed)
[place] [phone]   Fax:([phone]  OFFICE PROGRESS NOTE  Leonard Showers, MD [address] [place] [address]  DIAGNOSIS:  1) monoclonal gammopathy of undetermined significance.  2) iron deficiency anemia.  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 Tresa Moore.   PRIOR THERAPY: Feraheme infusion x 2 doses, last dose was given in October of 2014.  CURRENT THERAPY: Over-the-counter ferrous sulfate.  INTERVAL HISTORY: Leonard [name]. 71 y.o. male returns to the clinic today for follow-up visit. The patient is feeling fine today with no specific complaints. He is currently on oral iron tablets but was not taking it regularly recently. He is feeling fine and no fatigue. He denied having any recent weight loss or night sweats. He denied having any chest pain but has shortness of breath with exertion with no cough or hemoptysis. He is here today for reevaluation with repeat CBC, iron study and ferritin.  MEDICAL HISTORY: Past Medical History:  Diagnosis Date  . 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 Coronary artery disease   . Diverticulosis of colon (without mention of hemorrhage) 09/05/2006   Colonoscopy   . Gastric polyp 2014   done at [facility]  . GERD (gastroesophageal reflux disease) 07/26/2001   EGD(Chronic)  . Gouty arthropathy   . Hemorrhoids   . Hiatal hernia 09/05/2006   EGD  . Hx of colonic polyps 09/05/2006   Colonoscopy(ADENOMATOUS POLYP)  . Iron deficiency anemia, unspecified   . OSA (obstructive sleep apnea)    USES CPAP MACHINE   . Other and unspecified hyperlipidemia   . Prostate cancer ([place]) 2015  . Type II or unspecified type diabetes mellitus without mention of complication, not stated as uncontrolled   . Unspecified essential  hypertension     ALLERGIES:  is allergic to dicloxacillin; cayenne pepper [cayenne]; and nsaids.  MEDICATIONS:  Current Outpatient Prescriptions  Medication Sig Dispense Refill  . allopurinol (ZYLOPRIM) 300 MG tablet TAKE 1 TABLET BY MOUTH DAILY TO PREVENT GOUT 90 tablet 3  . amLODipine-benazepril (LOTREL) 10-20 MG capsule TAKE 1 CAPSULE BY MOUTH EVERY DAY 90 capsule 3  . aspirin 81 MG tablet Take 81 mg by mouth daily.    Marland Kitchen atorvastatin (LIPITOR) 10 MG tablet TAKE 1 TABLET (10 MG TOTAL) BY MOUTH DAILY. 90 tablet 2  . esomeprazole (NEXIUM) 40 MG capsule Take 1 capsule (40 mg total) by mouth daily. 90 capsule 3  . FeFum-FePoly-FA-B Cmp-C-Biot (INTEGRA PLUS) CAPS Take 1 capsule by mouth every morning. 90 capsule 1  . methocarbamol (ROBAXIN) 500 MG tablet Take 1 tablet (500 mg total) by mouth every 8 (eight) hours as needed for muscle spasms. 30 tablet 0  . nitroGLYCERIN (NITROSTAT) 0.4 MG SL tablet Place 0.4 mg under the tongue every 5 (five) minutes as needed for chest pain. Reported on 03/04/2016    . TRADJENTA 5 MG TABS tablet TAKE 1 TABLET BY MOUTH EVERY DAY 90 tablet 3  . triamterene-hydrochlorothiazide (MAXZIDE-25) 37.5-25 MG tablet TAKE 1 TABLET BY MOUTH EVERY DAY 30 tablet 11   No current facility-administered medications for this visit.     SURGICAL HISTORY:  Past Surgical History:  Procedure Laterality Date  . ESOPHAGOGASTRODUODENOSCOPY     Done at [facility]  . GI PROSTATE BIOPSY    . HEMORRHOID SURGERY    .  INCISIONAL HERNIA REPAIR N/A 08/07/2015   Procedure: LAPAROSCOPIC INCISIONAL HERNIA repair with mesh;  Surgeon: Arta Bruce Kinsinger, MD;  Location: [facility];  Service: General;  Laterality: N/A;  . KNEE SURGERY  1999   TUMOR REMOVED LEFT KNEE & ARTHROSCOPIC SURGERY  . LYMPHADENECTOMY Bilateral 05/01/2014   Procedure: LYMPHADENECTOMY;  Surgeon: Alexis Frock, MD;  Location: [facility];  Service: Urology;  Laterality: Bilateral;  . ROBOT ASSISTED LAPAROSCOPIC RADICAL PROSTATECTOMY N/A  05/01/2014   Procedure: ROBOTIC ASSISTED LAPAROSCOPIC RADICAL PROSTATECTOMY WITH INDOCYANINE GREEN DYE;  Surgeon: Alexis Frock, MD;  Location: [facility];  Service: Urology;  Laterality: N/A;    REVIEW OF SYSTEMS:  A comprehensive review of systems was negative.   PHYSICAL EXAMINATION: General appearance: alert, cooperative and no distress Head: Normocephalic, without obvious abnormality, atraumatic Neck: no adenopathy and thyroid not enlarged, symmetric, no tenderness/mass/nodules Lymph nodes: Cervical, supraclavicular, and axillary nodes normal. Resp: clear to auscultation bilaterally Back: symmetric, no curvature. ROM normal. No CVA tenderness. Cardio: regular rate and rhythm, S1, S2 normal, no murmur, click, rub or gallop GI: soft, non-tender; bowel sounds normal; no masses,  no organomegaly Extremities: extremities normal, atraumatic, no cyanosis or edema  ECOG PERFORMANCE STATUS: 1 - Symptomatic but completely ambulatory  Blood pressure (!) 142/77, pulse 69, temperature 98.6 F (37 C), temperature source Oral, resp. rate 18, height 5\' 9"  (1.753 m), weight 290 lb 4.8 oz (131.7 kg), SpO2 98 %.  LABORATORY DATA: Lab Results  Component Value Date   WBC 10.3 03/14/2017   HGB 11.8 (L) 03/14/2017   HCT 36.2 (L) 03/14/2017   MCV 94.8 03/14/2017   PLT 252 03/14/2017      Chemistry      Component Value Date/Time   NA 140 05/08/2016 1050   NA 139 02/26/2016 1030   K 4.0 05/08/2016 1050   K 3.8 02/26/2016 1030   CL 104 05/08/2016 1050   CL 104 04/17/2013 0857   CO2 27 05/08/2016 1050   CO2 25 02/26/2016 1030   BUN 29 (H) 05/08/2016 1050   BUN 28.3 (H) 02/26/2016 1030   CREATININE 1.38 (H) 05/08/2016 1050   CREATININE 1.3 02/26/2016 1030      Component Value Date/Time   CALCIUM 9.1 05/08/2016 1050   CALCIUM 9.8 02/26/2016 1030   ALKPHOS 61 05/08/2016 1050   ALKPHOS 65 02/26/2016 1030   AST 19 05/08/2016 1050   AST 16 02/26/2016 1030   ALT 20 05/08/2016 1050   ALT 15  02/26/2016 1030   BILITOT 0.4 05/08/2016 1050   BILITOT 0.48 02/26/2016 1030     Serum ferritin 127, serum iron 39, total iron binding capacity 300 and iron saturation 13%  RADIOGRAPHIC STUDIES: No results found.  ASSESSMENT AND PLAN:  This is a very pleasant 71 years old African-American male with iron deficiency anemia secondary to history of gastrointestinal blood loss. The patient is currently doing fine and he is on oral iron tablets. His serum iron is slightly lower than the normal range. Serum ferritin is normal. I recommended for the patient to continue with the oral iron tablets for now. I will see him back for follow-up visit in 6 months for reevaluation with repeat CBC, comprehensive metabolic panel, iron study, ferritin as well as myeloma panel because of his history of monoclonal gammopathy of undetermined significance. The patient was advised to call immediately if he has any concerning symptoms in the interval. The patient voices understanding of current disease status and treatment options and is in agreement with  the current care plan. All questions were answered. The patient knows to call the clinic with any problems, questions or concerns. We can certainly see the patient much sooner if necessary. I spent 10 minutes counseling the patient face to face. The total time spent in the appointment was 15 minutes.  Disclaimer: This note was dictated with voice recognition software. Similar sounding words can inadvertently be transcribed and may not be corrected upon review.

## 2017-03-21 NOTE — Telephone Encounter (Signed)
Gave patient AVS and calender per 5/7 los.  

## 2017-04-19 ENCOUNTER — Ambulatory Visit (INDEPENDENT_AMBULATORY_CARE_PROVIDER_SITE_OTHER): Payer: Medicare Other | Admitting: Internal Medicine

## 2017-04-19 ENCOUNTER — Encounter: Payer: Self-pay | Admitting: Internal Medicine

## 2017-04-19 VITALS — BP 112/60 | HR 85 | Temp 98.9°F | Wt 288.0 lb

## 2017-04-19 DIAGNOSIS — J069 Acute upper respiratory infection, unspecified: Secondary | ICD-10-CM

## 2017-04-19 MED ORDER — LEVOFLOXACIN 500 MG PO TABS
500.0000 mg | ORAL_TABLET | Freq: Every day | ORAL | 0 refills | Status: DC
Start: 2017-04-19 — End: 2017-07-04

## 2017-04-19 MED ORDER — HYDROCODONE-HOMATROPINE 5-1.5 MG/5ML PO SYRP: 5.0000 mL | ORAL_SOLUTION | Freq: Three times a day (TID) | ORAL | 0 refills | Status: DC | PRN

## 2017-04-19 NOTE — Patient Instructions (Signed)
Levaquin 500 milligrams daily for 10 days. Take with a meal. Hycodan 1 teaspoon by mouth every 8 hours when necessary cough. Rest and drink plenty of fluids. Call if not better in 10 days or sooner if worse.

## 2017-04-19 NOTE — Progress Notes (Signed)
   Subjective:    Patient ID: Leonard Pitter., male    DOB: 11/24/1945, 71 y.o.   MRN: [id number]  HPI 71 year old Black Male with cough x 2 weeks. Small amount of yellow sputum. Sore throat.No fever or chills. Can't seem to get rid of cough. Has tried Mucinex without relief. No ear pain. No chest pain.    Review of Systems see above     Objective:   Physical Exam Skin warm and dry. Nodes none. Pharynx slightly injected without exudate. TMs are clear. Neck is supple without adenopathy. Chest clear to auscultation without rales or wheezing.       Assessment & Plan:  Acute URI  Plan: Levaquin 500 milligrams daily for 10 days. Take with a meal. Hycodan 1 teaspoon by mouth every 8 hours when necessary cough. Rest and drink plenty of fluids. Call if not better in 10 days or sooner if worse.

## 2017-05-09 ENCOUNTER — Telehealth: Payer: Self-pay

## 2017-05-09 MED ORDER — ESOMEPRAZOLE MAGNESIUM 40 MG PO CPDR: 40.0000 mg | DELAYED_RELEASE_CAPSULE | Freq: Every day | ORAL | 3 refills | Status: DC

## 2017-05-09 NOTE — Telephone Encounter (Signed)
Esomeprazole 40mg  once a day was approved starting today 05/09/17-05/09/2018.

## 2017-05-20 ENCOUNTER — Other Ambulatory Visit: Payer: Self-pay | Admitting: Emergency Medicine

## 2017-05-20 DIAGNOSIS — D649 Anemia, unspecified: Secondary | ICD-10-CM

## 2017-05-20 MED ORDER — INTEGRA PLUS PO CAPS: 1.0000 | ORAL_CAPSULE | Freq: Every morning | ORAL | 1 refills | Status: DC

## 2017-06-03 ENCOUNTER — Other Ambulatory Visit: Payer: Self-pay

## 2017-07-01 ENCOUNTER — Other Ambulatory Visit: Payer: Medicare Other | Admitting: Internal Medicine

## 2017-07-01 DIAGNOSIS — Z0000 Encounter for general adult medical examination without abnormal findings: Secondary | ICD-10-CM

## 2017-07-01 DIAGNOSIS — I10 Essential (primary) hypertension: Secondary | ICD-10-CM

## 2017-07-01 DIAGNOSIS — D509 Iron deficiency anemia, unspecified: Secondary | ICD-10-CM

## 2017-07-01 DIAGNOSIS — E785 Hyperlipidemia, unspecified: Secondary | ICD-10-CM

## 2017-07-01 DIAGNOSIS — N179 Acute kidney failure, unspecified: Secondary | ICD-10-CM

## 2017-07-01 DIAGNOSIS — Z8546 Personal history of malignant neoplasm of prostate: Secondary | ICD-10-CM

## 2017-07-01 DIAGNOSIS — K219 Gastro-esophageal reflux disease without esophagitis: Secondary | ICD-10-CM

## 2017-07-01 DIAGNOSIS — E119 Type 2 diabetes mellitus without complications: Secondary | ICD-10-CM

## 2017-07-01 LAB — CBC WITH DIFFERENTIAL/PLATELET
BASOS ABS: 0 {cells}/uL (ref 0–200)
Basophils Relative: 0 %
EOS ABS: 208 {cells}/uL (ref 15–500)
EOS PCT: 2 %
HEMATOCRIT: 36.5 % — AB (ref 38.5–50.0)
HEMOGLOBIN: 12 g/dL — AB (ref 13.2–17.1)
Lymphocytes Relative: 34 %
Lymphs Abs: 3536 cells/uL (ref 850–3900)
MCH: 30.9 pg (ref 27.0–33.0)
MCHC: 32.9 g/dL (ref 32.0–36.0)
MCV: 94.1 fL (ref 80.0–100.0)
MONO ABS: 624 {cells}/uL (ref 200–950)
MPV: 9.1 fL (ref 7.5–12.5)
Monocytes Relative: 6 %
NEUTROS PCT: 58 %
Neutro Abs: 6032 cells/uL (ref 1500–7800)
Platelets: 304 10*3/uL (ref 140–400)
RBC: 3.88 MIL/uL — ABNORMAL LOW (ref 4.20–5.80)
RDW: 16 % — ABNORMAL HIGH (ref 11.0–15.0)
WBC: 10.4 10*3/uL (ref 3.8–10.8)

## 2017-07-02 LAB — LIPID PANEL
CHOL/HDL RATIO: 2.1 ratio (ref <5.0)
CHOLESTEROL: 150 mg/dL (ref <200)
HDL: 70 mg/dL (ref >40)
LDL Cholesterol: 62 mg/dL (ref <100)
Triglycerides: 91 mg/dL (ref <150)
VLDL: 18 mg/dL (ref <30)

## 2017-07-02 LAB — PSA

## 2017-07-02 LAB — COMPLETE METABOLIC PANEL WITH GFR
ALBUMIN: 4 g/dL (ref 3.6–5.1)
ALK PHOS: 75 U/L (ref 40–115)
ALT: 15 U/L (ref 9–46)
AST: 14 U/L (ref 10–35)
BILIRUBIN TOTAL: 0.5 mg/dL (ref 0.2–1.2)
BUN: 27 mg/dL — ABNORMAL HIGH (ref 7–25)
CALCIUM: 9.5 mg/dL (ref 8.6–10.3)
CO2: 23 mmol/L (ref 20–32)
Chloride: 103 mmol/L (ref 98–110)
Creat: 1.25 mg/dL (ref 0.70–1.25)
GFR, EST AFRICAN AMERICAN: 67 mL/min (ref >=60)
GFR, EST NON AFRICAN AMERICAN: 58 mL/min — AB (ref >=60)
Glucose, Bld: 116 mg/dL — ABNORMAL HIGH (ref 65–99)
POTASSIUM: 3.8 mmol/L (ref 3.5–5.3)
Sodium: 138 mmol/L (ref 135–146)
Total Protein: 6.8 g/dL (ref 6.1–8.1)

## 2017-07-02 LAB — MICROALBUMIN / CREATININE URINE RATIO
Creatinine, Urine: 223 mg/dL (ref 20–370)
MICROALB UR: 4.2 mg/dL
MICROALB/CREAT RATIO: 19 ug/mg{creat} (ref <30)

## 2017-07-02 LAB — HEMOGLOBIN A1C
Hgb A1c MFr Bld: 6.4 % — ABNORMAL HIGH (ref <5.7)
Mean Plasma Glucose: 137 mg/dL

## 2017-07-04 ENCOUNTER — Ambulatory Visit (INDEPENDENT_AMBULATORY_CARE_PROVIDER_SITE_OTHER): Payer: Medicare Other | Admitting: Internal Medicine

## 2017-07-04 ENCOUNTER — Encounter: Payer: Self-pay | Admitting: Internal Medicine

## 2017-07-04 VITALS — BP 110/60 | HR 78 | Temp 97.9°F | Ht 67.0 in | Wt 284.0 lb

## 2017-07-04 DIAGNOSIS — R7989 Other specified abnormal findings of blood chemistry: Secondary | ICD-10-CM | POA: Diagnosis not present

## 2017-07-04 DIAGNOSIS — I10 Essential (primary) hypertension: Secondary | ICD-10-CM | POA: Diagnosis not present

## 2017-07-04 DIAGNOSIS — Z0000 Encounter for general adult medical examination without abnormal findings: Secondary | ICD-10-CM | POA: Diagnosis not present

## 2017-07-04 DIAGNOSIS — Z8546 Personal history of malignant neoplasm of prostate: Secondary | ICD-10-CM

## 2017-07-04 DIAGNOSIS — E119 Type 2 diabetes mellitus without complications: Secondary | ICD-10-CM | POA: Diagnosis not present

## 2017-07-04 DIAGNOSIS — I2510 Atherosclerotic heart disease of native coronary artery without angina pectoris: Secondary | ICD-10-CM | POA: Diagnosis not present

## 2017-07-04 DIAGNOSIS — D472 Monoclonal gammopathy: Secondary | ICD-10-CM

## 2017-07-04 DIAGNOSIS — E784 Other hyperlipidemia: Secondary | ICD-10-CM | POA: Diagnosis not present

## 2017-07-04 DIAGNOSIS — Z6841 Body Mass Index (BMI) 40.0 and over, adult: Secondary | ICD-10-CM | POA: Diagnosis not present

## 2017-07-04 DIAGNOSIS — K219 Gastro-esophageal reflux disease without esophagitis: Secondary | ICD-10-CM | POA: Diagnosis not present

## 2017-07-04 DIAGNOSIS — Z8639 Personal history of other endocrine, nutritional and metabolic disease: Secondary | ICD-10-CM

## 2017-07-04 DIAGNOSIS — E7849 Other hyperlipidemia: Secondary | ICD-10-CM

## 2017-07-04 LAB — POCT URINALYSIS DIPSTICK
Bilirubin, UA: NEGATIVE
Glucose, UA: NEGATIVE
KETONES UA: NEGATIVE
LEUKOCYTES UA: NEGATIVE
NITRITE UA: NEGATIVE
PH UA: 6.5 (ref 5.0–8.0)
PROTEIN UA: NEGATIVE
RBC UA: NEGATIVE
Spec Grav, UA: 1.02 (ref 1.010–1.025)
Urobilinogen, UA: 0.2 E.U./dL

## 2017-07-04 NOTE — Patient Instructions (Signed)
It was a pleasure to see you today. Continue follow-up with your subspecialist. Work on diet exercise and weight loss. Return in 6 months. Continue same medications.

## 2017-07-04 NOTE — Progress Notes (Signed)
Subjective:    Patient ID: Leonard [name]., male    DOB: [birth year], 71 y.o.   MRN: [id number]  HPI 71 year old Black Male for Medicare wellness, health maintenance and evaluation of medical issues. Hx MGUS- received IV iron 2014. Hx iron deficiency suspect GI source.  He underwent surgery for prostate cancer June 2015 and is followed by Dr. Tresa Moore. He also is followed at [facility] by Dr. Earlie Server for benign monoclonal gammopathy and iron deficiency anemia.  He has a history of essential hypertension, diabetes mellitus, obesity, metabolic syndrome, hyperlipidemia. History of obstructive sleep apnea.  He is followed by Dr. Dorris Carnes for coronary artery disease. He had minimal coronary artery disease on catheterization in 27. History of gout and GE reflux. History of adenomatous colon polyp. History of bilateral carpal tunnel syndrome, peripheral neuropathy, low testosterone and tinea versicolor.  Had Fe TIBC in April and level was 39. To see Dr. Earlie Server in October. Hx MGUS.  Social history: He is retired from the [employer]. 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 He is married. Does not smoke or consume alcohol.  Family history: Father died of an MI. Mother died of an MI. 2 brothers with history of diabetes. One sister. Mother had diabetes. Both parents had hypertension.  Review of Systems  Constitutional: Negative.   HENT: Negative.   Respiratory: Negative.   Cardiovascular: Negative.   Gastrointestinal: Negative.   Genitourinary: Negative.   Musculoskeletal: Positive for back pain.  Neurological: Negative.   Psychiatric/Behavioral: Negative.        Objective:   Physical Exam  Constitutional: He is oriented to person, place, and time. He appears well-developed and well-nourished. No distress.  HENT:  Head: Normocephalic and atraumatic.  Right Ear: External ear normal.  Left Ear: External ear normal.  Mouth/Throat: Oropharynx is clear and moist. No  oropharyngeal exudate.  Eyes: Pupils are equal, round, and reactive to light. Conjunctivae and EOM are normal. Right eye exhibits no discharge. Left eye exhibits no discharge. No scleral icterus.  Neck: Neck supple. No JVD present. No thyromegaly present.  Cardiovascular: Normal rate, regular rhythm and normal heart sounds.   No murmur heard. Pulmonary/Chest: Effort normal and breath sounds normal. No respiratory distress. He has no wheezes. He has no rales.  Abdominal: Soft. Bowel sounds are normal. He exhibits no distension and no mass. There is no tenderness. There is no rebound and no guarding.  Genitourinary:  Genitourinary Comments: S/P  Prostatectomy. Not examined.   Musculoskeletal: He exhibits no edema.  Lymphadenopathy:    He has no cervical adenopathy.  Neurological: He is alert and oriented to person, place, and time. He has normal reflexes. No cranial nerve deficit.  Skin: Skin is warm and dry. No rash noted. He is not diaphoretic.  Psychiatric: He has a normal mood and affect. His behavior is normal. Judgment and thought content normal.  Vitals reviewed.         Assessment & Plan:  He is not exercising very much according to his wife. He's become mostly sedentary and spends lots of time in his office. He is a Company secretary.  Needs to watch diet and exercise.  Obesity-needs to watch diet and exercise  Adult onset diabetes-hemoglobin A1c 6.4% previously was 6.2%  Essential hypertension-stable on current regimen  Hyperlipidemia-lipid panel is normal  Nonocclusive coronary disease-followed by cardiology  GE reflux-treated with PPI  History of gout  History of tinea versicolor  History of prostate  cancer status post prostatectomy-followed by urologist  History of bilateral carpal tunnel syndrome  Peripheral neuropathy  Obstructive sleep apnea  History of kidney stones  History of benign monoclonal gammopathy-followed by hematologist  History of iron  deficiency-MCV is normal. Hemoglobin 12.0 g. Never higher than 11-12 g--- followed by hematologist  History of gastritis-treated with chronic PPI therapy  History of adenomatous colon polyp  History of angioedema  Microalbuminuria  Elevated serum creatinine-improved from 1 year ago when it was 1.38 and it is now 1.25. Continue to monitor.  Subjective:   Patient presents for Medicare Annual/Subsequent preventive examination.  Review Past Medical/Family/Social:See above   Risk Factors  Current exercise habits: Sedentary Dietary issues discussed: Low fat low carbohydrate  Cardiac risk factors:Personal and family history, diabetes mellitus, hyperlipidemia  Depression Screen  (Note: if answer to either of the following is "Yes", a more complete depression screening is indicated)   Over the past two weeks, have you felt down, depressed or hopeless? No  Over the past two weeks, have you felt little interest or pleasure in doing things? No Have you lost interest or pleasure in daily life? No Do you often feel hopeless? No Do you cry easily over simple problems? No   Activities of Daily Living  In your present state of health, do you have any difficulty performing the following activities?:   Driving? No  Managing money? No  Feeding yourself? No  Getting from bed to chair? No  Climbing a flight of stairs? No  Preparing food and eating?: No  Bathing or showering? No  Getting dressed: No  Getting to the toilet? No  Using the toilet:No  Moving around from place to place: No  In the past year have you fallen or had a near fall?:No  Are you sexually active? Yes Do you have more than one partner? No   Hearing Difficulties: No  Do you often ask people to speak up or repeat themselves? No  Do you experience ringing or noises in your ears? No  Do you have difficulty understanding soft or whispered voices? No  Do you feel that you have a problem with memory? No Do you often  misplace items? No    Home Safety:  Do you have a smoke alarm at your residence? Yes Do you have grab bars in the bathroom?No Do you have throw rugs in your house? Yes   Cognitive Testing  Alert? Yes Normal Appearance?Yes  Oriented to person? Yes Place? Yes  Time? Yes  Recall of three objects? Yes  Can perform simple calculations? Yes  Displays appropriate judgment?Yes  Can read the correct time from a watch face?Yes   List the Names of Other Physician/Practitioners you currently use:  See referral list for the physicians patient is currently seeing.     Review of Systems: See above   Objective:     General appearance: Appears stated age and  obese  Head: Normocephalic, without obvious abnormality, atraumatic  Eyes: conj clear, EOMi PEERLA  Ears: normal TM's and external ear canals both ears  Nose: Nares normal. Septum midline. Mucosa normal. No drainage or sinus tenderness.  Throat: lips, mucosa, and tongue normal; teeth and gums normal  Neck: no adenopathy, no carotid bruit, no JVD, supple, symmetrical, trachea midline and thyroid not enlarged, symmetric, no tenderness/mass/nodules  No CVA tenderness.  Lungs: clear to auscultation bilaterally  Breasts: normal appearance, no masses or tenderness Heart: regular rate and rhythm, S1, S2 normal, no murmur, click, rub or  gallop  Abdomen: soft, non-tender; bowel sounds normal; no masses, no organomegaly  Musculoskeletal: ROM normal in all joints, no crepitus, no deformity, Normal muscle strengthen. Back  is symmetric, no curvature. Skin: Skin color, texture, turgor normal. No rashes or lesions  Lymph nodes: Cervical, supraclavicular, and axillary nodes normal.  Neurologic: CN 2 -12 Normal, Normal symmetric reflexes. Normal coordination and gait  Psych: Alert & Oriented x 3, Mood appear stable.    Assessment:    Annual wellness medicare exam   Plan:    During the course of the visit the patient was educated and  counseled about appropriate screening and preventive services including:   Recommend annual flu vaccine     Patient Instructions (the written plan) was given to the patient.  Medicare Attestation  I have personally reviewed:  The patient's medical and social history  Their use of alcohol, tobacco or illicit drugs  Their current medications and supplements  The patient's functional ability including ADLs,fall risks, home safety risks, cognitive, and hearing and visual impairment  Diet and physical activities  Evidence for depression or mood disorders  The patient's weight, height, BMI, and visual acuity have been recorded in the chart. I have made referrals, counseling, and provided education to the patient based on review of the above and I have provided the patient with a written personalized care plan for preventive services.

## 2017-07-05 DIAGNOSIS — N499 Inflammatory disorder of unspecified male genital organ: Secondary | ICD-10-CM | POA: Diagnosis not present

## 2017-07-07 ENCOUNTER — Other Ambulatory Visit (INDEPENDENT_AMBULATORY_CARE_PROVIDER_SITE_OTHER): Payer: Medicare Other | Admitting: Internal Medicine

## 2017-07-07 DIAGNOSIS — D649 Anemia, unspecified: Secondary | ICD-10-CM

## 2017-07-07 LAB — HEMOCCULT GUIAC POC 1CARD (OFFICE)
Card #3 Fecal Occult Blood, POC: POSITIVE
FECAL OCCULT BLD: POSITIVE
Fecal Occult Blood, POC: POSITIVE — AB

## 2017-07-07 NOTE — Progress Notes (Signed)
Pt did not follow instructions when he did the FOB test we will repeat it.

## 2017-07-14 ENCOUNTER — Other Ambulatory Visit (INDEPENDENT_AMBULATORY_CARE_PROVIDER_SITE_OTHER): Payer: Medicare Other | Admitting: Internal Medicine

## 2017-07-14 ENCOUNTER — Telehealth: Payer: Self-pay

## 2017-07-14 DIAGNOSIS — Z1211 Encounter for screening for malignant neoplasm of colon: Secondary | ICD-10-CM

## 2017-07-14 DIAGNOSIS — D509 Iron deficiency anemia, unspecified: Secondary | ICD-10-CM

## 2017-07-14 DIAGNOSIS — R195 Other fecal abnormalities: Secondary | ICD-10-CM

## 2017-07-14 LAB — HEMOCCULT GUIAC POC 1CARD (OFFICE)
Card #2 Fecal Occult Blod, POC: POSITIVE
FECAL OCCULT BLD: POSITIVE
FECAL OCCULT BLD: POSITIVE — AB

## 2017-07-14 NOTE — Telephone Encounter (Signed)
-----   Message from [name], MD sent at 07/14/2017 12:52 PM EDT ----- We are going to refer him back to [facility] Dr. Hilarie Fredrickson. Chronic iron deficiency and heme positive stools

## 2017-07-14 NOTE — Progress Notes (Signed)
Stool cards were returned today, results abnormal.

## 2017-07-14 NOTE — Telephone Encounter (Signed)
Referral done

## 2017-07-14 NOTE — Patient Instructions (Signed)
Stool cards were positive.

## 2017-08-11 ENCOUNTER — Ambulatory Visit: Payer: Medicare Other | Admitting: Internal Medicine

## 2017-08-12 ENCOUNTER — Ambulatory Visit (INDEPENDENT_AMBULATORY_CARE_PROVIDER_SITE_OTHER): Payer: Medicare Other | Admitting: Internal Medicine

## 2017-08-12 DIAGNOSIS — Z23 Encounter for immunization: Secondary | ICD-10-CM | POA: Diagnosis not present

## 2017-08-12 NOTE — Progress Notes (Signed)
Flu vaccine given.

## 2017-09-06 DIAGNOSIS — C61 Malignant neoplasm of prostate: Secondary | ICD-10-CM | POA: Diagnosis not present

## 2017-09-08 ENCOUNTER — Encounter: Payer: Self-pay | Admitting: *Deleted

## 2017-09-13 ENCOUNTER — Ambulatory Visit: Payer: Medicare Other | Admitting: Internal Medicine

## 2017-09-13 DIAGNOSIS — E291 Testicular hypofunction: Secondary | ICD-10-CM | POA: Diagnosis not present

## 2017-09-13 DIAGNOSIS — N5201 Erectile dysfunction due to arterial insufficiency: Secondary | ICD-10-CM | POA: Diagnosis not present

## 2017-09-13 DIAGNOSIS — N393 Stress incontinence (female) (male): Secondary | ICD-10-CM | POA: Diagnosis not present

## 2017-09-13 DIAGNOSIS — C61 Malignant neoplasm of prostate: Secondary | ICD-10-CM | POA: Diagnosis not present

## 2017-09-14 ENCOUNTER — Other Ambulatory Visit (HOSPITAL_BASED_OUTPATIENT_CLINIC_OR_DEPARTMENT_OTHER): Payer: Medicare Other

## 2017-09-14 DIAGNOSIS — D472 Monoclonal gammopathy: Secondary | ICD-10-CM

## 2017-09-14 DIAGNOSIS — D508 Other iron deficiency anemias: Secondary | ICD-10-CM | POA: Diagnosis not present

## 2017-09-14 DIAGNOSIS — D509 Iron deficiency anemia, unspecified: Secondary | ICD-10-CM

## 2017-09-14 LAB — CBC WITH DIFFERENTIAL/PLATELET
BASO%: 0.2 % (ref 0.0–2.0)
BASOS ABS: 0 10*3/uL (ref 0.0–0.1)
EOS ABS: 0.3 10*3/uL (ref 0.0–0.5)
EOS%: 2 % (ref 0.0–7.0)
HEMATOCRIT: 36.3 % — AB (ref 38.4–49.9)
HEMOGLOBIN: 11.9 g/dL — AB (ref 13.0–17.1)
LYMPH%: 32.1 % (ref 14.0–49.0)
MCH: 31.4 pg (ref 27.2–33.4)
MCHC: 32.8 g/dL (ref 32.0–36.0)
MCV: 95.8 fL (ref 79.3–98.0)
MONO#: 0.7 10*3/uL (ref 0.1–0.9)
MONO%: 5.7 % (ref 0.0–14.0)
NEUT#: 7.7 10*3/uL — ABNORMAL HIGH (ref 1.5–6.5)
NEUT%: 60 % (ref 39.0–75.0)
Platelets: 284 10*3/uL (ref 140–400)
RBC: 3.79 10*6/uL — ABNORMAL LOW (ref 4.20–5.82)
RDW: 15 % — AB (ref 11.0–14.6)
WBC: 12.8 10*3/uL — ABNORMAL HIGH (ref 4.0–10.3)
lymph#: 4.1 10*3/uL — ABNORMAL HIGH (ref 0.9–3.3)

## 2017-09-14 LAB — COMPREHENSIVE METABOLIC PANEL
ALBUMIN: 3.6 g/dL (ref 3.5–5.0)
ALK PHOS: 80 U/L (ref 40–150)
ALT: 18 U/L (ref 0–55)
AST: 17 U/L (ref 5–34)
Anion Gap: 10 mEq/L (ref 3–11)
BUN: 28.3 mg/dL — AB (ref 7.0–26.0)
CALCIUM: 10.1 mg/dL (ref 8.4–10.4)
CO2: 27 mEq/L (ref 22–29)
CREATININE: 1.4 mg/dL — AB (ref 0.7–1.3)
Chloride: 104 mEq/L (ref 98–109)
EGFR: >60 mL/min/{1.73_m2} (ref >60)
Glucose: 113 mg/dl (ref 70–140)
POTASSIUM: 4 meq/L (ref 3.5–5.1)
Sodium: 140 mEq/L (ref 136–145)
Total Bilirubin: 0.45 mg/dL (ref 0.20–1.20)
Total Protein: 8 g/dL (ref 6.4–8.3)

## 2017-09-14 LAB — LACTATE DEHYDROGENASE: LDH: 136 U/L (ref 125–245)

## 2017-09-15 ENCOUNTER — Other Ambulatory Visit: Payer: Self-pay | Admitting: Internal Medicine

## 2017-09-15 LAB — KAPPA/LAMBDA LIGHT CHAINS
Ig Kappa Free Light Chain: 59 mg/L — ABNORMAL HIGH (ref 3.3–19.4)
Ig Lambda Free Light Chain: 30.1 mg/L — ABNORMAL HIGH (ref 5.7–26.3)
Kappa/Lambda FluidC Ratio: 1.96 — ABNORMAL HIGH (ref 0.26–1.65)

## 2017-09-15 LAB — IGG, IGA, IGM
IGA/IMMUNOGLOBULIN A, SERUM: 212 mg/dL (ref 61–437)
IGM (IMMUNOGLOBIN M), SRM: 130 mg/dL (ref 20–172)
IgG, Qn, Serum: 1124 mg/dL (ref 700–1600)

## 2017-09-15 LAB — BETA 2 MICROGLOBULIN, SERUM: BETA 2: 2.1 mg/L (ref 0.6–2.4)

## 2017-09-20 ENCOUNTER — Encounter: Payer: Self-pay | Admitting: Internal Medicine

## 2017-09-20 ENCOUNTER — Ambulatory Visit (INDEPENDENT_AMBULATORY_CARE_PROVIDER_SITE_OTHER): Payer: Medicare Other | Admitting: Internal Medicine

## 2017-09-20 VITALS — BP 122/80 | HR 82 | Temp 98.3°F | Wt 294.0 lb

## 2017-09-20 DIAGNOSIS — M25552 Pain in left hip: Secondary | ICD-10-CM | POA: Diagnosis not present

## 2017-09-20 DIAGNOSIS — M5440 Lumbago with sciatica, unspecified side: Secondary | ICD-10-CM

## 2017-09-20 DIAGNOSIS — I2510 Atherosclerotic heart disease of native coronary artery without angina pectoris: Secondary | ICD-10-CM | POA: Diagnosis not present

## 2017-09-20 MED ORDER — CYCLOBENZAPRINE HCL 10 MG PO TABS: 10.0000 mg | ORAL_TABLET | Freq: Every day | ORAL | 0 refills | Status: DC

## 2017-09-20 MED ORDER — HYDROCODONE-ACETAMINOPHEN 10-325 MG PO TABS: 1.0000 | ORAL_TABLET | ORAL | 0 refills | Status: DC | PRN

## 2017-09-20 MED ORDER — PREDNISONE 10 MG PO TABS: ORAL_TABLET | ORAL | 0 refills | Status: DC

## 2017-09-20 NOTE — Progress Notes (Signed)
   Subjective:    Patient ID: Leonard [name]., male    DOB: October 23, 1946, 71 y.o.   MRN: [id number]  HPI 71 year old Black Male with MGUS followed by Dr. Earlie Server. Has appt tomorrow to see him. See recent labs. IgKappa free light chain elevated at 59 and previously was 35.1. Quantitative Immunoglobulins are stable. Creatinine 1.4 and previously 1.25. WBC is elevated at 12,800.  Hx iron deficiency. Hx prostatectomy for prostate cancer.  He has developed some low back pain.  Does not recall any particular heavy lifting or injury.  He plans to drive to [facility].  He has had considerable amount of pain and stiffness.  Wife called today very concerned about him.  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  He is overweight.  Not getting much exercise.    Review of Systems see above     Objective:   Physical Exam He is moving very slowly in the office.  Straight leg raising is negative at 90 degrees.  Deep tendon reflexes 1+ and symmetrical in the knees and absent in ankles.  Range of motion of the trunk is fair.       Assessment & Plan:  Acute low back pain  Plan: Flexeril 10 mg at bedtime.  Had x-rays of the lumbar spine left hip and pelvis.  Norco 10/325 sparingly for pain.  Prednisone 10 mg (#21 (going from 60 mg to 0 mg over 7 days.  If he decides to drive to [facility] tomorrow he needs to take frequent breaks and stretch.  It is not getting better we will need to consider physical therapy.  Addendum: X-ray of the left hip is negative and x-ray of the LS spine shows degenerative changes mild to moderate in degree.

## 2017-09-21 ENCOUNTER — Telehealth: Payer: Self-pay | Admitting: Internal Medicine

## 2017-09-21 ENCOUNTER — Ambulatory Visit
Admission: RE | Admit: 2017-09-21 | Discharge: 2017-09-21 | Disposition: A | Discharge status: Home / Self Care | Payer: Medicare Other | Source: Ambulatory Visit | Attending: Internal Medicine | Admitting: Internal Medicine

## 2017-09-21 ENCOUNTER — Ambulatory Visit (HOSPITAL_BASED_OUTPATIENT_CLINIC_OR_DEPARTMENT_OTHER): Payer: Medicare Other | Admitting: Internal Medicine

## 2017-09-21 ENCOUNTER — Encounter: Payer: Self-pay | Admitting: Internal Medicine

## 2017-09-21 VITALS — BP 138/70 | HR 85 | Temp 97.5°F | Resp 17 | Ht 67.0 in | Wt 291.7 lb

## 2017-09-21 DIAGNOSIS — M47816 Spondylosis without myelopathy or radiculopathy, lumbar region: Secondary | ICD-10-CM | POA: Diagnosis not present

## 2017-09-21 DIAGNOSIS — D509 Iron deficiency anemia, unspecified: Secondary | ICD-10-CM | POA: Diagnosis not present

## 2017-09-21 DIAGNOSIS — D472 Monoclonal gammopathy: Secondary | ICD-10-CM

## 2017-09-21 DIAGNOSIS — M25552 Pain in left hip: Secondary | ICD-10-CM | POA: Diagnosis not present

## 2017-09-21 DIAGNOSIS — Z8546 Personal history of malignant neoplasm of prostate: Secondary | ICD-10-CM | POA: Diagnosis not present

## 2017-09-21 NOTE — Progress Notes (Signed)
[facility] Telephone:([phone]) [phone]   [phone]  OFFICE PROGRESS NOTE  [name], MD [address] [place] [address]  DIAGNOSIS:  1) monoclonal gammopathy of undetermined significance.  2) iron deficiency anemia.  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 Tresa Moore.   PRIOR THERAPY: Feraheme infusion x 2 doses, last dose was given in October of 2014.  CURRENT THERAPY: Over-the-counter ferrous sulfate.  INTERVAL HISTORY: Leonard [name]. 71 y.o. male  returns to the clinic today for annual follow-up visit.  The patient is feeling fine today with no specific complaints except for left hip pain started a week ago.  He was seen by his primary care physician and expected to have imaging studies of the left hip today.  He denied having any recent fatigue or weakness.  He denied having any chest pain, shortness of breath, cough or hemoptysis.  He has no nausea, vomiting, diarrhea or constipation.  He continues with over-the-counter ferrous sulfate and tolerating it well.  The patient had repeat myeloma panel performed recently and he is here for evaluation and discussion of his lab results.   MEDICAL HISTORY: Past Medical History:  Diagnosis Date  . 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 Coronary artery disease   . Diverticulosis of colon (without mention of hemorrhage) 09/05/2006   Colonoscopy   . Gastric polyp 2014   done at [facility]  . GERD (gastroesophageal reflux disease) 07/26/2001   EGD(Chronic)  . Gouty arthropathy   . Hemorrhoids   . Hiatal hernia 09/05/2006   EGD  . Hx of colonic polyps 09/05/2006   Colonoscopy(ADENOMATOUS POLYP)  . Iron deficiency anemia, unspecified   . Monoclonal gammopathy   . OSA (obstructive sleep apnea)    USES CPAP  MACHINE   . Other and unspecified hyperlipidemia   . Prostate cancer ([facility]) 2015  . Type II or unspecified type diabetes mellitus without mention of complication, not stated as uncontrolled   . Unspecified essential hypertension     ALLERGIES:  is allergic to dicloxacillin; cayenne pepper [cayenne]; and nsaids.  MEDICATIONS:  Current Outpatient Medications  Medication Sig Dispense Refill  . allopurinol (ZYLOPRIM) 300 MG tablet TAKE 1 TABLET BY MOUTH DAILY TO PREVENT GOUT 90 tablet 3  . amLODipine-benazepril (LOTREL) 10-20 MG capsule TAKE 1 CAPSULE BY MOUTH EVERY DAY 90 capsule 3  . aspirin 81 MG tablet Take 81 mg by mouth daily.    Marland Kitchen atorvastatin (LIPITOR) 10 MG tablet Take 1 tablet (10 mg total) by mouth daily. Please make overdue appt with Dr. Harrington Challenger before anymore refills. 1st attempt 30 tablet 0  . cyclobenzaprine (FLEXERIL) 10 MG tablet Take 1 tablet (10 mg total) at bedtime by mouth. 30 tablet 0  . esomeprazole (NEXIUM) 40 MG capsule Take 1 capsule (40 mg total) by mouth daily. 90 capsule 3  . FeFum-FePoly-FA-B Cmp-C-Biot (INTEGRA PLUS) CAPS Take 1 capsule by mouth every morning. 90 capsule 1  . HYDROcodone-acetaminophen (NORCO) 10-325 MG tablet Take 1 tablet every 4 (four) hours as needed by mouth. 30 tablet 0  . nitroGLYCERIN (NITROSTAT) 0.4 MG SL tablet Place 0.4 mg under the tongue every 5 (five) minutes as needed for chest pain. Reported on 03/04/2016    . predniSONE (DELTASONE) 10 MG tablet Take in tapering course as directed 6-5-4-3-2-1 21 tablet  0  . TRADJENTA 5 MG TABS tablet TAKE 1 TABLET BY MOUTH EVERY DAY 90 tablet 3  . triamterene-hydrochlorothiazide (MAXZIDE-25) 37.5-25 MG tablet TAKE 1 TABLET BY MOUTH EVERY DAY 30 tablet 11   No current facility-administered medications for this visit.     SURGICAL HISTORY:  Past Surgical History:  Procedure Laterality Date  . ESOPHAGOGASTRODUODENOSCOPY     Done at [facility]  . GI PROSTATE BIOPSY    . HEMORRHOID SURGERY    . KNEE SURGERY   1999   TUMOR REMOVED LEFT KNEE & ARTHROSCOPIC SURGERY    REVIEW OF SYSTEMS:  A comprehensive review of systems was negative except for: Musculoskeletal: positive for arthralgias   PHYSICAL EXAMINATION: General appearance: alert, cooperative and no distress Head: Normocephalic, without obvious abnormality, atraumatic Neck: no adenopathy and thyroid not enlarged, symmetric, no tenderness/mass/nodules Lymph nodes: Cervical, supraclavicular, and axillary nodes normal. Resp: clear to auscultation bilaterally Back: symmetric, no curvature. ROM normal. No CVA tenderness. Cardio: regular rate and rhythm, S1, S2 normal, no murmur, click, rub or gallop GI: soft, non-tender; bowel sounds normal; no masses,  no organomegaly Extremities: extremities normal, atraumatic, no cyanosis or edema  ECOG PERFORMANCE STATUS: 1 - Symptomatic but completely ambulatory  Blood pressure 138/70, pulse 85, temperature (!) 97.5 F (36.4 C), temperature source Oral, resp. rate 17, height 5\' 7"  (1.702 m), weight 291 lb 11.2 oz (132.3 kg), SpO2 98 %.  LABORATORY DATA: Lab Results  Component Value Date   WBC 12.8 (H) 09/14/2017   HGB 11.9 (L) 09/14/2017   HCT 36.3 (L) 09/14/2017   MCV 95.8 09/14/2017   PLT 284 09/14/2017      Chemistry      Component Value Date/Time   NA 140 09/14/2017 0928   K 4.0 09/14/2017 0928   CL 103 07/01/2017 1036   CL 104 04/17/2013 0857   CO2 27 09/14/2017 0928   BUN 28.3 (H) 09/14/2017 0928   CREATININE 1.4 (H) 09/14/2017 0928      Component Value Date/Time   CALCIUM 10.1 09/14/2017 0928   ALKPHOS 80 09/14/2017 0928   AST 17 09/14/2017 0928   ALT 18 09/14/2017 0928   BILITOT 0.45 09/14/2017 0928     RADIOGRAPHIC STUDIES: No results found.  ASSESSMENT AND PLAN:  This is a very pleasant 71 years old African-American male with iron deficiency anemia secondary to history of gastrointestinal blood loss.  The patient is feeling fine today with no specific complaints except  for the left hip pain. Myeloma panel showed no concerning findings for disease progression but there was a slight increase in the free kappa light chain. I discussed the lab results with the patient and recommended for him to continue on observation with repeat myeloma panel in 1 year. For the anemia, he will continue with the oral iron tablets for now. He was advised to call immediately if he has any concerning symptoms in the interval. The patient voices understanding of current disease status and treatment options and is in agreement with the current care plan. All questions were answered. The patient knows to call the clinic with any problems, questions or concerns. We can certainly see the patient much sooner if necessary. I spent 10 minutes counseling the patient face to face. The total time spent in the appointment was 15 minutes.  Disclaimer: This note was dictated with voice recognition software. Similar sounding words can inadvertently be transcribed and may not be corrected upon review.

## 2017-09-21 NOTE — Telephone Encounter (Signed)
Gave avs and calendar for November 2019 °

## 2017-09-26 ENCOUNTER — Encounter: Payer: Self-pay | Admitting: Internal Medicine

## 2017-09-26 ENCOUNTER — Ambulatory Visit (INDEPENDENT_AMBULATORY_CARE_PROVIDER_SITE_OTHER): Payer: Medicare Other | Admitting: Internal Medicine

## 2017-09-26 VITALS — BP 136/76 | HR 84 | Ht 67.0 in | Wt 294.1 lb

## 2017-09-26 DIAGNOSIS — I2510 Atherosclerotic heart disease of native coronary artery without angina pectoris: Secondary | ICD-10-CM | POA: Diagnosis not present

## 2017-09-26 DIAGNOSIS — K317 Polyp of stomach and duodenum: Secondary | ICD-10-CM

## 2017-09-26 DIAGNOSIS — D509 Iron deficiency anemia, unspecified: Secondary | ICD-10-CM | POA: Diagnosis not present

## 2017-09-26 NOTE — Patient Instructions (Signed)
You have been scheduled for an endoscopy. Please follow written instructions given to you at your visit today. If you use inhalers (even only as needed), please bring them with you on the day of your procedure. Your physician has requested that you go to www.startemmi.com and enter the access code given to you at your visit today. This web site gives a general overview about your procedure. However, you should still follow specific instructions given to you by our office regarding your preparation for the procedure.  If you are age 49 or older, your body mass index should be between 23-30. Your Body mass index is 46.07 kg/m. If this is out of the aforementioned range listed, please consider follow up with your Primary Care Provider.  If you are age 58 or younger, your body mass index should be between 19-25. Your Body mass index is 46.07 kg/m. If this is out of the aformentioned range listed, please consider follow up with your Primary Care Provider.

## 2017-09-26 NOTE — Progress Notes (Signed)
Subjective:    Patient ID: Leonard Pitter., male    DOB: 28-Jan-1946, 71 y.o.   MRN: [id number]  HPI Leonard [name] is a 70 year old male with a history of large hyperplastic gastric polyp with associated iron deficiency anemia, GERD, MGUS, history of prostate cancer status post resection who is here for follow-up.  He was last seen in August 2016 to evaluate upper abdominal pain.  Since this time he had a hernia repair with Dr. Marcello Moores.  He has had no further abdominal pain.  He returns on advice from his primary doctor to follow-up his iron deficiency anemia.  He is followed with Dr. Julien Nordmann at the cancer center and is on oral iron.  His blood counts have been stable and his iron studies have been also.  His last ferritin was over 100.  He has had a persistent mild anemia with last hemoglobin 11.9 he is taking Integra +2 capsules daily  He also takes Nexium daily and denies heartburn.  No dysphagia.  No early satiety.  No weight loss.  No abdominal pain.  Normal bowel movements without blood in his stool or melena.  He had a large hyperplastic gastric polyp removed in November 2014 at [facility].  This was hyperplastic.  One year recall was recommended.  He has not had subsequent upper endoscopy.  Normal colonoscopy in 2013.  No family history of colon cancer  Review of Systems As per HPI, otherwise negative  I have taken an interval history, reviewed the chart, and examined the patient. I agree with the Advanced Practitioner's note and impression.    Objective:   Physical Exam BP 136/76   Pulse 84   Ht 5\' 7"  (1.702 m)   Wt 294 lb 2 oz (133.4 kg)   BMI 46.07 kg/m  Constitutional: Well-developed and well-nourished. No distress. HEENT: Normocephalic and atraumatic Conjunctivae are normal.  No scleral icterus. Neck: Neck supple. Trachea midline. Cardiovascular: Normal rate, regular rhythm and intact distal pulses. Pulmonary/chest: Effort normal and breath sounds normal. No wheezing, rales or  rhonchi. Abdominal: Soft, nontender, nondistended. Bowel sounds active throughout. There are no masses palpable.  Extremities: no clubbing, cyanosis, or edema Neurological: Alert and oriented to person place and time. Skin: Skin is warm and dry. Psychiatric: Normal mood and affect. Behavior is normal.  CBC    Component Value Date/Time   WBC 12.8 (H) 09/14/2017 0928   WBC 10.4 07/01/2017 1036   RBC 3.79 (L) 09/14/2017 0928   RBC 3.88 (L) 07/01/2017 1036   HGB 11.9 (L) 09/14/2017 0928   HCT 36.3 (L) 09/14/2017 0928   PLT 284 09/14/2017 0928   MCV 95.8 09/14/2017 0928   MCH 31.4 09/14/2017 0928   MCH 30.9 07/01/2017 1036   MCHC 32.8 09/14/2017 0928   MCHC 32.9 07/01/2017 1036   RDW 15.0 (H) 09/14/2017 0928   LYMPHSABS 4.1 (H) 09/14/2017 0928   MONOABS 0.7 09/14/2017 0928   EOSABS 0.3 09/14/2017 0928   BASOSABS 0.0 09/14/2017 0928   CMP     Component Value Date/Time   NA 140 09/14/2017 0928   K 4.0 09/14/2017 0928   CL 103 07/01/2017 1036   CL 104 04/17/2013 0857   CO2 27 09/14/2017 0928   GLUCOSE 113 09/14/2017 0928   GLUCOSE 105 (H) 04/17/2013 0857   BUN 28.3 (H) 09/14/2017 0928   CREATININE 1.4 (H) 09/14/2017 0928   CALCIUM 10.1 09/14/2017 0928   PROT 8.0 09/14/2017 0928   ALBUMIN 3.6 09/14/2017 5809  AST 17 09/14/2017 0928   ALT 18 09/14/2017 0928   ALKPHOS 80 09/14/2017 0928   BILITOT 0.45 09/14/2017 0928   GFRNONAA 58 (L) 07/01/2017 1036   GFRAA 67 07/01/2017 1036   Iron/TIBC/Ferritin/ %Sat    Component Value Date/Time   IRON 39 (L) 03/14/2017 0910   TIBC 300 03/14/2017 0910   FERRITIN 127 03/14/2017 0910   IRONPCTSAT 13 (L) 03/14/2017 0910   IRONPCTSAT 15 02/16/2016 1110       Assessment & Plan:  71 year old male with a history of large hyperplastic gastric polyp with associated iron deficiency anemia, GERD, MGUS, history of prostate cancer status post resection who is here for follow-up.   1.  History of 2 cm hyperplastic gastric polyp/history of  iron deficiency --iron studies and blood counts have been near normal with oral iron.  He has not received Feraheme or IV iron since October 2014.  I have recommended repeat upper endoscopy, as previously recommended by Dr. Stephanie Acre, to rule out recurrent gastric polyps which could be source for iron deficiency.  We will schedule this at [facility] in the outpatient hospital setting in case polypectomy is needed.  We discussed the risk, benefits and alternatives and he is agreeable and wishes to proceed --To new Integra +2 capsules daily.  He will follow with Dr. Julien Nordmann before blood counts and iron studies  2.  GERD --history of with no alarm symptoms.  He will continue Nexium 40 mg daily  3.  CRC screening --recall colonoscopy 2023 after normal colonoscopy in 2013  25 minutes spent with the patient today. Greater than 50% was spent in counseling and coordination of care with the patient

## 2017-10-14 ENCOUNTER — Other Ambulatory Visit: Payer: Self-pay | Admitting: Internal Medicine

## 2017-10-14 NOTE — Patient Instructions (Signed)
Take prednisone and tapering course as directed.  Take Norco sparingly for pain and Flexeril at bedtime.  Call if not better in 48-72 hours or sooner if worse.

## 2017-10-17 ENCOUNTER — Other Ambulatory Visit: Payer: Self-pay | Admitting: Internal Medicine

## 2017-10-21 ENCOUNTER — Other Ambulatory Visit: Payer: Self-pay | Admitting: Internal Medicine

## 2017-10-21 DIAGNOSIS — D649 Anemia, unspecified: Secondary | ICD-10-CM

## 2017-10-25 ENCOUNTER — Encounter (HOSPITAL_COMMUNITY): Payer: Self-pay | Admitting: Emergency Medicine

## 2017-10-25 ENCOUNTER — Other Ambulatory Visit: Payer: Self-pay

## 2017-11-04 ENCOUNTER — Ambulatory Visit (HOSPITAL_COMMUNITY): Payer: Medicare Other | Admitting: Anesthesiology

## 2017-11-04 ENCOUNTER — Encounter (HOSPITAL_COMMUNITY): Payer: Self-pay | Admitting: *Deleted

## 2017-11-04 ENCOUNTER — Other Ambulatory Visit: Payer: Self-pay

## 2017-11-04 ENCOUNTER — Encounter (HOSPITAL_COMMUNITY)
Admission: RE | Disposition: A | Discharge status: Home / Self Care | Payer: Self-pay | Source: Ambulatory Visit | Attending: Internal Medicine

## 2017-11-04 ENCOUNTER — Ambulatory Visit (HOSPITAL_COMMUNITY)
Admission: RE | Admit: 2017-11-04 | Discharge: 2017-11-04 | Disposition: A | Discharge status: Home / Self Care | Payer: Medicare Other | Source: Ambulatory Visit | Attending: Internal Medicine | Admitting: Internal Medicine

## 2017-11-04 DIAGNOSIS — Z881 Allergy status to other antibiotic agents status: Secondary | ICD-10-CM | POA: Insufficient documentation

## 2017-11-04 DIAGNOSIS — Z9889 Other specified postprocedural states: Secondary | ICD-10-CM | POA: Diagnosis not present

## 2017-11-04 DIAGNOSIS — Z9102 Food additives allergy status: Secondary | ICD-10-CM | POA: Diagnosis not present

## 2017-11-04 DIAGNOSIS — Z8249 Family history of ischemic heart disease and other diseases of the circulatory system: Secondary | ICD-10-CM | POA: Diagnosis not present

## 2017-11-04 DIAGNOSIS — Z6841 Body Mass Index (BMI) 40.0 and over, adult: Secondary | ICD-10-CM | POA: Diagnosis not present

## 2017-11-04 DIAGNOSIS — Z86010 Personal history of colonic polyps: Secondary | ICD-10-CM | POA: Insufficient documentation

## 2017-11-04 DIAGNOSIS — I2510 Atherosclerotic heart disease of native coronary artery without angina pectoris: Secondary | ICD-10-CM | POA: Insufficient documentation

## 2017-11-04 DIAGNOSIS — K317 Polyp of stomach and duodenum: Secondary | ICD-10-CM | POA: Diagnosis not present

## 2017-11-04 DIAGNOSIS — Z880 Allergy status to penicillin: Secondary | ICD-10-CM | POA: Diagnosis not present

## 2017-11-04 DIAGNOSIS — Z888 Allergy status to other drugs, medicaments and biological substances status: Secondary | ICD-10-CM | POA: Diagnosis not present

## 2017-11-04 DIAGNOSIS — K449 Diaphragmatic hernia without obstruction or gangrene: Secondary | ICD-10-CM | POA: Insufficient documentation

## 2017-11-04 DIAGNOSIS — D509 Iron deficiency anemia, unspecified: Secondary | ICD-10-CM | POA: Diagnosis not present

## 2017-11-04 DIAGNOSIS — Z833 Family history of diabetes mellitus: Secondary | ICD-10-CM | POA: Insufficient documentation

## 2017-11-04 DIAGNOSIS — E119 Type 2 diabetes mellitus without complications: Secondary | ICD-10-CM | POA: Diagnosis not present

## 2017-11-04 DIAGNOSIS — G4733 Obstructive sleep apnea (adult) (pediatric): Secondary | ICD-10-CM | POA: Insufficient documentation

## 2017-11-04 DIAGNOSIS — E785 Hyperlipidemia, unspecified: Secondary | ICD-10-CM | POA: Diagnosis not present

## 2017-11-04 DIAGNOSIS — K3189 Other diseases of stomach and duodenum: Secondary | ICD-10-CM | POA: Diagnosis not present

## 2017-11-04 DIAGNOSIS — K56609 Unspecified intestinal obstruction, unspecified as to partial versus complete obstruction: Secondary | ICD-10-CM | POA: Diagnosis not present

## 2017-11-04 DIAGNOSIS — Z8546 Personal history of malignant neoplasm of prostate: Secondary | ICD-10-CM | POA: Diagnosis not present

## 2017-11-04 DIAGNOSIS — K219 Gastro-esophageal reflux disease without esophagitis: Secondary | ICD-10-CM | POA: Diagnosis not present

## 2017-11-04 DIAGNOSIS — K649 Unspecified hemorrhoids: Secondary | ICD-10-CM | POA: Insufficient documentation

## 2017-11-04 DIAGNOSIS — Z8719 Personal history of other diseases of the digestive system: Secondary | ICD-10-CM | POA: Insufficient documentation

## 2017-11-04 DIAGNOSIS — M109 Gout, unspecified: Secondary | ICD-10-CM | POA: Insufficient documentation

## 2017-11-04 DIAGNOSIS — D472 Monoclonal gammopathy: Secondary | ICD-10-CM | POA: Diagnosis not present

## 2017-11-04 DIAGNOSIS — I10 Essential (primary) hypertension: Secondary | ICD-10-CM | POA: Diagnosis not present

## 2017-11-04 HISTORY — PX: ESOPHAGOGASTRODUODENOSCOPY (EGD) WITH PROPOFOL: SHX5813

## 2017-11-04 LAB — GLUCOSE, CAPILLARY: Glucose-Capillary: 111 mg/dL — ABNORMAL HIGH (ref 65–99)

## 2017-11-04 SURGERY — ESOPHAGOGASTRODUODENOSCOPY (EGD) WITH PROPOFOL
Anesthesia: Monitor Anesthesia Care

## 2017-11-04 MED ORDER — PROPOFOL 500 MG/50ML IV EMUL
INTRAVENOUS | Status: DC | PRN
  Administered 2017-11-04: 40 mg via INTRAVENOUS

## 2017-11-04 MED ORDER — PROPOFOL 500 MG/50ML IV EMUL
INTRAVENOUS | Status: DC | PRN
  Administered 2017-11-04: 125 ug/kg/min via INTRAVENOUS

## 2017-11-04 MED ORDER — PROPOFOL 10 MG/ML IV BOLUS
INTRAVENOUS | Status: AC
  Filled 2017-11-04: qty 40

## 2017-11-04 MED ORDER — LACTATED RINGERS IV SOLN
INTRAVENOUS | Status: DC
  Administered 2017-11-04: 1000 mL via INTRAVENOUS

## 2017-11-04 MED ORDER — ONDANSETRON HCL 4 MG/2ML IJ SOLN
INTRAMUSCULAR | Status: DC | PRN
  Administered 2017-11-04: 4 mg via INTRAVENOUS

## 2017-11-04 MED ORDER — SODIUM CHLORIDE 0.9 % IV SOLN: INTRAVENOUS | Status: DC

## 2017-11-04 SURGICAL SUPPLY — 14 items

## 2017-11-04 NOTE — Transfer of Care (Signed)
Immediate Anesthesia Transfer of Care Note  Patient: Leonard [name].  Procedure(s) Performed: ESOPHAGOGASTRODUODENOSCOPY (EGD) WITH PROPOFOL (N/A )  Patient Location: PACU  Anesthesia Type:MAC  Level of Consciousness: awake, alert  and oriented  Airway & Oxygen Therapy: Patient Spontanous Breathing and Patient connected to nasal cannula oxygen  Post-op Assessment: Report given to RN and Post -op Vital signs reviewed and stable  Post vital signs: Reviewed and stable  Last Vitals:  Vitals:   11/04/17 0756 11/04/17 0934  BP:  130/85  Pulse: 83 84  Resp: 18 15  Temp: 36.8 C   SpO2: 93% 95%    Last Pain:  Vitals:   11/04/17 0756  TempSrc: Oral         Complications: No apparent anesthesia complications

## 2017-11-04 NOTE — Op Note (Signed)
[facility] Patient Name: Leonard [name] Procedure Date: 11/04/2017 MRN: [id number] Attending MD: Jerene Bears , MD Date of Birth: November 12, 1946 CSN: [id number] Age: 71 Admit Type: Outpatient Procedure:                Upper GI endoscopy Indications:              Follow-up of gastric polyps Providers:                Lajuan Lines. Hilarie Fredrickson, MD, Zenon Mayo, RN, Tinnie Gens,                            Technician, Rosario Adie, CRNA Referring MD:             Cresenciano Lick. [name] Medicines:                Monitored Anesthesia Care Complications:            No immediate complications. Estimated Blood Loss:     Estimated blood loss: none. Procedure:                Pre-Anesthesia Assessment:                           - Prior to the procedure, a History and Physical                            was performed, and patient medications and                            allergies were reviewed. The patient's tolerance of                            previous anesthesia was also reviewed. The risks                            and benefits of the procedure and the sedation                            options and risks were discussed with the patient.                            All questions were answered, and informed consent                            was obtained. Prior Anticoagulants: The patient has                            taken no previous anticoagulant or antiplatelet                            agents. ASA Grade Assessment: III - A patient with                            severe systemic disease. After reviewing the risks  and benefits, the patient was deemed in                            satisfactory condition to undergo the procedure.                           After obtaining informed consent, the endoscope was                            passed under direct vision. Throughout the                            procedure, the patient's blood pressure, pulse, and                   oxygen saturations were monitored continuously. The                            [id number]) scope was introduced through the                            mouth, and advanced to the second part of duodenum.                            The upper GI endoscopy was accomplished without                            difficulty. The patient tolerated the procedure                            well. Scope In: Scope Out: Findings:      The examined esophagus was normal. Z-line is regular at 40 cm.      The cardia and gastric fundus were normal on retroflexion.      Localized nodular mucosa was found in the gastric antrum. This is benign       appearing and not associated with significant inflammation. No evidence       of recurrent, significant, hyperplastic gastric polyp(s).      The exam of the stomach was otherwise normal.      The examined duodenum was normal. Impression:               - Normal esophagus.                           - Nodular mucosa in the gastric antrum.                           - No evidence of clinically significant, recurrent,                            hyperplastic gastric polyp(s).                           - Normal examined duodenum.                           -  No specimens collected. Moderate Sedation:      N/A Recommendation:           - Patient has a contact number available for                            emergencies. The signs and symptoms of potential                            delayed complications were discussed with the                            patient. Return to normal activities tomorrow.                            Written discharge instructions were provided to the                            patient.                           - Resume previous diet.                           - Continue present medications.                           - Monitor Hgb and iron studies.                           - No plan for further EGD surveillance unless                             clinically warranted. Procedure Code(s):        --- Professional ---                           [phone], Esophagogastroduodenoscopy, flexible,                            transoral; diagnostic, including collection of                            specimen(s) by brushing or washing, when performed                            (separate procedure) Diagnosis Code(s):        --- Professional ---                           K31.89, Other diseases of stomach and duodenum                           K31.7, Polyp of stomach and duodenum CPT copyright 2016 American Medical Association. All rights reserved. The codes documented in this report are preliminary and upon coder review may  be revised to meet current compliance requirements. Jerene Bears, MD 11/04/2017 9:45:06 AM This report  has been signed electronically. Number of Addenda: 0

## 2017-11-04 NOTE — Anesthesia Postprocedure Evaluation (Signed)
Anesthesia Post Note  Patient: Leonard [name].  Procedure(s) Performed: ESOPHAGOGASTRODUODENOSCOPY (EGD) WITH PROPOFOL (N/A )     Patient location during evaluation: PACU Anesthesia Type: MAC Level of consciousness: awake Pain management: pain level controlled Vital Signs Assessment: post-procedure vital signs reviewed and stable Cardiovascular status: stable Postop Assessment: no apparent nausea or vomiting Anesthetic complications: no    Last Vitals:  Vitals:   11/04/17 0940 11/04/17 0950  BP: 120/75 (!) 142/81  Pulse: 72 81  Resp: 17 (!) 26  Temp:    SpO2: 92% 93%    Last Pain:  Vitals:   11/04/17 0756  TempSrc: Oral   Pain Goal:                 Kyleena Scheirer JR,JOHN Eriko Economos

## 2017-11-04 NOTE — Discharge Instructions (Signed)
YOU HAD AN ENDOSCOPIC PROCEDURE TODAY: Refer to the procedure report and other information in the discharge instructions given to you for any specific questions about what was found during the examination. If this information does not answer your questions, please call [place] office at [phone] to clarify.  ° °YOU SHOULD EXPECT: Some feelings of bloating in the abdomen. Passage of more gas than usual. Walking can help get rid of the air that was put into your GI tract during the procedure and reduce the bloating. If you had a lower endoscopy (such as a colonoscopy or flexible sigmoidoscopy) you may notice spotting of blood in your stool or on the toilet paper. Some abdominal soreness may be present for a day or two, also. ° °DIET: Your first meal following the procedure should be a light meal and then it is ok to progress to your normal diet. A half-sandwich or bowl of soup is an example of a good first meal. Heavy or fried foods are harder to digest and may make you feel nauseous or bloated. Drink plenty of fluids but you should avoid alcoholic beverages for 24 hours. If you had a esophageal dilation, please see attached instructions for diet.   ° °ACTIVITY: Your care partner should take you home directly after the procedure. You should plan to take it easy, moving slowly for the rest of the day. You can resume normal activity the day after the procedure however YOU SHOULD NOT DRIVE, use power tools, machinery or perform tasks that involve climbing or major physical exertion for 24 hours (because of the sedation medicines used during the test).  ° °SYMPTOMS TO REPORT IMMEDIATELY: °A gastroenterologist can be reached at any hour. Please call [phone]  for any of the following symptoms:  °Following lower endoscopy (colonoscopy, flexible sigmoidoscopy) °Excessive amounts of blood in the stool  °Significant tenderness, worsening of abdominal pains  °Swelling of the abdomen that is new, acute  °Fever of 100° or  higher  °Following upper endoscopy (EGD, EUS, ERCP, esophageal dilation) °Vomiting of blood or coffee ground material  °New, significant abdominal pain  °New, significant chest pain or pain under the shoulder blades  °Painful or persistently difficult swallowing  °New shortness of breath  °Black, tarry-looking or red, bloody stools ° °FOLLOW UP:  °If any biopsies were taken you will be contacted by phone or by letter within the next 1-3 weeks. Call [phone]  if you have not heard about the biopsies in 3 weeks.  °Please also call with any specific questions about appointments or follow up tests. ° °

## 2017-11-04 NOTE — Anesthesia Procedure Notes (Signed)
Date/Time: 11/04/2017 9:10 AM Performed by: Glory Buff, CRNA Oxygen Delivery Method: Nasal cannula

## 2017-11-04 NOTE — Anesthesia Preprocedure Evaluation (Addendum)
Anesthesia Evaluation  Patient identified by MRN, date of birth, ID band Patient awake    Reviewed: Allergy & Precautions, NPO status , Patient's Chart, lab work & pertinent test results  Airway Mallampati: I       Dental no notable dental hx. (+) Teeth Intact   Pulmonary    Pulmonary exam normal breath sounds clear to auscultation       Cardiovascular hypertension, Pt. on medications Normal cardiovascular exam Rhythm:Regular Rate:Normal     Neuro/Psych negative psych ROS   GI/Hepatic GERD  Medicated,  Endo/Other  diabetes, Type 2Morbid obesity  Renal/GU      Musculoskeletal   Abdominal (+) + obese,   Peds  Hematology   Anesthesia Other Findings   Reproductive/Obstetrics                             Anesthesia Physical Anesthesia Plan  ASA: III  Anesthesia Plan: MAC   Post-op Pain Management:    Induction:   PONV Risk Score and Plan: 1 and Ondansetron  Airway Management Planned: Mask and Natural Airway  Additional Equipment:   Intra-op Plan:   Post-operative Plan:   Informed Consent: I have reviewed the patients History and Physical, chart, labs and discussed the procedure including the risks, benefits and alternatives for the proposed anesthesia with the patient or authorized representative who has indicated his/her understanding and acceptance.     Plan Discussed with:   Anesthesia Plan Comments:         Anesthesia Quick Evaluation

## 2017-11-04 NOTE — H&P (Signed)
HPI: Leonard [name] is a 71 year old male with history of large hyperplastic gastric polyp associated with IDA, GERD, MGUS and history of prostate cancer who presents for outpatient surveillance upper endoscopy.  I saw him in the office on 09/26/2017.  He has been treated for iron deficiency anemia his blood counts and iron studies have been stable but he has remained on oral iron.  He has not needed IV iron in quite some time.  He denies specific GI complaint.  In 2014 he had a large hyperplastic gastric polyp removed at [facility], 1 year recall was recommended but never occurred.  Past Medical History:  Diagnosis Date  . 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 Coronary artery disease   . Diverticulosis of colon (without mention of hemorrhage) 09/05/2006   Colonoscopy   . Gastric polyp 2014   done at [facility]  . GERD (gastroesophageal reflux disease) 07/26/2001   EGD(Chronic)  . Gouty arthropathy   . Hemorrhoids   . Hiatal hernia 09/05/2006   EGD  . Hx of colonic polyps 09/05/2006   Colonoscopy(ADENOMATOUS POLYP)  . Iron deficiency anemia, unspecified   . Monoclonal gammopathy   . OSA (obstructive sleep apnea)    USES CPAP MACHINE   . Other and unspecified hyperlipidemia   . Prostate cancer ([place]) 2015  . Type II or unspecified type diabetes mellitus without mention of complication, not stated as uncontrolled   . Unspecified essential hypertension     Past Surgical History:  Procedure Laterality Date  . ESOPHAGOGASTRODUODENOSCOPY     Done at [facility]  . GI PROSTATE BIOPSY    . HEMORRHOID SURGERY    . INCISIONAL HERNIA REPAIR N/A 08/07/2015   Procedure: LAPAROSCOPIC INCISIONAL HERNIA repair with mesh;  Surgeon: Arta Bruce Kinsinger, MD;  Location: [facility] ORS;  Service: General;  Laterality: N/A;  . KNEE SURGERY  1999   TUMOR REMOVED LEFT KNEE & ARTHROSCOPIC SURGERY  . LYMPHADENECTOMY Bilateral 05/01/2014   Procedure: LYMPHADENECTOMY;  Surgeon: Alexis Frock, MD;   Location: [facility];  Service: Urology;  Laterality: Bilateral;  . ROBOT ASSISTED LAPAROSCOPIC RADICAL PROSTATECTOMY N/A 05/01/2014   Procedure: ROBOTIC ASSISTED LAPAROSCOPIC RADICAL PROSTATECTOMY WITH INDOCYANINE GREEN DYE;  Surgeon: Alexis Frock, MD;  Location: [facility];  Service: Urology;  Laterality: N/A;     (Not in an outpatient encounter)  Allergies  Allergen Reactions  . Dicloxacillin Rash and Other (See Comments)    Has patient had a PCN reaction causing immediate rash, facial/tongue/throat swelling, SOB or lightheadedness with hypotension: No Has patient had a PCN reaction causing severe rash involving mucus membranes or skin necrosis: No Has patient had a PCN reaction that required hospitalization: No Has patient had a PCN reaction occurring within the last 10 years: No If all of the above answers are "NO", then may proceed with Cephalosporin use.   Gretta Arab Pepper [Cayenne] Swelling  . Nsaids Other (See Comments)    PT declines Acute renal failure 08/09/2015    Family History  Problem Relation Age of Onset  . Heart attack Father   . Diabetes Mother   . Diabetes Brother   . Colon cancer Neg Hx   . Stomach cancer Neg Hx   . Esophageal cancer Neg Hx   . Rectal cancer Neg Hx   . Liver cancer Neg Hx     Social History   Tobacco Use  . Smoking status: Never Smoker  . Smokeless tobacco: Never Used  Substance Use Topics  . Alcohol  use: No  . Drug use: No    ROS: As per history of present illness, otherwise negative  Pulse 83   Temp 98.2 F (36.8 C) (Oral)   Resp 18   Ht 5' 7"  (1.702 m)   Wt 294 lb (133.4 kg)   SpO2 93%   BMI 46.05 kg/m  Cancer prevention ? Skin exam: This looks for skin cancers like melanoma, squamous cell skin cancer and basal cell skin cancer. All patients on immunosuppression should get a skin exam every year. We recommend you see a dermatologist for this annually. ? Pap smear: This test checks for cervical cancer. Average risk women on  immunosuppression should get this done every year. If you have had a hysterectomy, this may not be necessary. All women with IBD should make sure they are keeping up with the regular recommendations for pap smears.  ? Colonoscopy: Ask your gastroenterologist when you are due for your next colonoscopy. This is important to look for colorectal cancer and how often you need one depends on how long you have had IBD, where your disease is active, and your family history. Vaccines ? Influenza: Everyone should get a flu shot annually. If you are on immunosuppression, you need the shot and not the nasal spray because the nasal spray contains live virus. The shot does not contain any live or viable virus and cannot give you the flu. ? Pneumococcal PSV13: This is also called Prevnar. Everyone who is on or planning to be on immunosuppression should receive this once. This shot protects against several kinds of bacterial pneumonia. ? Pneumococcal PPSV23: This is also called Pneumovax. Everyone who is on or planning to be on immunosuppression should receive this once with repeat in 5 years. This shot protects against several kinds of bacterial pneumonia. ? Tetanus: Everyone should get this shot every 10 years. There is a version of this shot called TDAP, which protects against tetanus, diphtheria and whooping cough, and your doctor will decide which version of the shot you should receive based on prior immunizations. ? HPV: All young adults under 26 should receive 3 doses within 6 months. This protects against several strains of the human papilloma virus (HPV) that can cause cervical cancer, anal cancer, penile cancer and cancers of the oropharynx and that can cause genital warts. ? Hepatitis A & B vaccines: If you are not immune it is recommended you get vaccinated. Vaccination protects against Hepatitis A and/or Hepatitis B viruses depending on the vaccine selected by your doctor. ? Meningococcal vaccine: This is  recommended for most adolescents and young adults.  ? Important caution if you are on high dose immunosuppression: Vaccines which contain live viruses include varicella (chicken pox), herpes zoster (shingles), the influenza nasal spray, MMR (measles, mumps, rubella) and yellow fever. Patients on immunosuppression (biologic therapies, thiopurines, higher doses of steroids) should not receive these vaccines. These may be appropriate if you are on very low doses of immunosuppression, but make sure you talk to your doctor about this prior to vaccination.  More ? DEXA scan: This test looks at the health of bones and screens for osteoporosis. All patients who have been on steroids for a long time, high risk patients, and women with a low BMI or after menopause should get this at least once, and then repeated based on time parameters recommended by your doctor ? Tobacco use: Talk to your doctor if you are smoking or chewing tobacco about the possibility and timing of quitting smoking.  We  encourage you to learn more about your maintaining your health and about your inflammatory bowel disease at [email or web address].org/     RELEVANT LABS AND IMAGING: CBC    Component Value Date/Time   WBC 12.8 (H) 09/14/2017 0928   WBC 10.4 07/01/2017 1036   RBC 3.79 (L) 09/14/2017 0928   RBC 3.88 (L) 07/01/2017 1036   HGB 11.9 (L) 09/14/2017 0928   HCT 36.3 (L) 09/14/2017 0928   PLT 284 09/14/2017 0928   MCV 95.8 09/14/2017 0928   MCH 31.4 09/14/2017 0928   MCH 30.9 07/01/2017 1036   MCHC 32.8 09/14/2017 0928   MCHC 32.9 07/01/2017 1036   RDW 15.0 (H) 09/14/2017 0928   LYMPHSABS 4.1 (H) 09/14/2017 0928   MONOABS 0.7 09/14/2017 0928   EOSABS 0.3 09/14/2017 0928   BASOSABS 0.0 09/14/2017 0928    CMP     Component Value Date/Time   NA 140 09/14/2017 0928   K 4.0 09/14/2017 0928   CL 103 07/01/2017 1036   CL 104 04/17/2013 0857   CO2 27 09/14/2017 0928   GLUCOSE 113 09/14/2017 0928   GLUCOSE 105  (H) 04/17/2013 0857   BUN 28.3 (H) 09/14/2017 0928   CREATININE 1.4 (H) 09/14/2017 0928   CALCIUM 10.1 09/14/2017 0928   PROT 8.0 09/14/2017 0928   ALBUMIN 3.6 09/14/2017 0928   AST 17 09/14/2017 0928   ALT 18 09/14/2017 0928   ALKPHOS 80 09/14/2017 0928   BILITOT 0.45 09/14/2017 0928   GFRNONAA 58 (L) 07/01/2017 1036   GFRAA 67 07/01/2017 1036    ASSESSMENT/PLAN: 71 year old male with history of large hyperplastic gastric polyp associated with IDA, GERD, MGUS and history of prostate cancer who presents for outpatient surveillance upper endoscopy.  1.  History of hyperplastic gastric polyp and iron deficiency --repeat upper endoscopy today with monitored anesthesia care for hyperplastic gastric polyp surveillance.  This surveillance is after resection of this large polyp in 2014. The nature of the procedure, as well as the risks, benefits, and alternatives were carefully and thoroughly reviewed with the patient. Ample time for discussion and questions allowed. The patient understood, was satisfied, and agreed to proceed.

## 2017-11-06 ENCOUNTER — Encounter (HOSPITAL_COMMUNITY): Payer: Self-pay | Admitting: Internal Medicine

## 2017-11-06 ENCOUNTER — Other Ambulatory Visit: Payer: Self-pay | Admitting: Internal Medicine

## 2017-11-22 ENCOUNTER — Encounter: Payer: Self-pay | Admitting: Internal Medicine

## 2017-11-22 ENCOUNTER — Ambulatory Visit (INDEPENDENT_AMBULATORY_CARE_PROVIDER_SITE_OTHER): Payer: Medicare Other | Admitting: Internal Medicine

## 2017-11-22 VITALS — BP 138/70 | HR 90 | Temp 98.2°F | Wt 289.0 lb

## 2017-11-22 DIAGNOSIS — Z6841 Body Mass Index (BMI) 40.0 and over, adult: Secondary | ICD-10-CM

## 2017-11-22 DIAGNOSIS — J069 Acute upper respiratory infection, unspecified: Secondary | ICD-10-CM | POA: Diagnosis not present

## 2017-11-22 MED ORDER — AZITHROMYCIN 250 MG PO TABS: ORAL_TABLET | ORAL | 0 refills | Status: DC

## 2017-11-22 MED ORDER — HYDROCODONE-HOMATROPINE 5-1.5 MG/5ML PO SYRP: 5.0000 mL | ORAL_SOLUTION | Freq: Three times a day (TID) | ORAL | 0 refills | Status: DC | PRN

## 2017-11-22 NOTE — Patient Instructions (Signed)
Take Hycodan as directed and Zithromax as directed. Call if not better next week. Rest and drink plenty of fluids.

## 2017-11-22 NOTE — Progress Notes (Signed)
   Subjective:    Patient ID: Leonard [name]., male    DOB: September 20, 1946, 72 y.o.   MRN: [id number]  HPI   72 year old Black Male with cough onset Sunday. No fever. Maybe some chills. Cough is slightly productive of green sputum.  Has had flu vaccine.  Mucinex DM not working for cough.  Has a funeral on Thursday.  He is a Company secretary.  Talked with him about diet exercise and weight loss.  He needs to start walking outside on a regular basis.  Says sometimes his ankle feels weak.  He may want to see orthopedist about that.  May has some ligament laxity and may need a brace when exercising.    Review of Systems see above     Objective:   Physical Exam Skin warm and dry.  Nodes none.  Pharynx is clear.  TMs are clear.  Neck is supple.  Chest clear to auscultation.       Assessment & Plan:  Acute URI  Morbid obesity  Plan: Begin daily walking when weather permits.  Zithromax Z-Pak take 2 p.o. day 1 followed by 1 p.o. days 2 through 5.  Hycodan 1 teaspoon p.o. every 8 hours as needed cough.  Rest and drink plenty of fluids.  Call if not better next week.

## 2017-11-24 ENCOUNTER — Ambulatory Visit: Payer: Medicare Other | Admitting: Internal Medicine

## 2017-11-30 ENCOUNTER — Other Ambulatory Visit: Payer: Self-pay | Admitting: Internal Medicine

## 2017-11-30 DIAGNOSIS — I10 Essential (primary) hypertension: Secondary | ICD-10-CM

## 2017-11-30 DIAGNOSIS — E119 Type 2 diabetes mellitus without complications: Secondary | ICD-10-CM

## 2017-11-30 DIAGNOSIS — Z79899 Other long term (current) drug therapy: Secondary | ICD-10-CM

## 2017-11-30 DIAGNOSIS — E785 Hyperlipidemia, unspecified: Secondary | ICD-10-CM

## 2017-12-12 ENCOUNTER — Telehealth: Payer: Self-pay | Admitting: Internal Medicine

## 2017-12-12 MED ORDER — ATORVASTATIN CALCIUM 10 MG PO TABS: ORAL_TABLET | ORAL | 0 refills | Status: DC

## 2017-12-12 NOTE — Telephone Encounter (Signed)
Please keep upcoming appt for future refills. Thank you

## 2017-12-12 NOTE — Telephone Encounter (Signed)
New message     *STAT* If patient is at the pharmacy, call can be transferred to refill team.   1. Which medications need to be refilled? (please list name of each medication and dose if known) atorvastatin (LIPITOR) 10 MG tablet  2. Which pharmacy/location (including street and city if local pharmacy) is medication to be sent to? [facility] [address]  3. Do they need a 30 day or 90 day supply? [place]

## 2017-12-27 ENCOUNTER — Other Ambulatory Visit: Payer: Medicare Other | Admitting: Internal Medicine

## 2017-12-27 DIAGNOSIS — E119 Type 2 diabetes mellitus without complications: Secondary | ICD-10-CM | POA: Diagnosis not present

## 2017-12-27 DIAGNOSIS — I10 Essential (primary) hypertension: Secondary | ICD-10-CM

## 2017-12-27 DIAGNOSIS — Z79899 Other long term (current) drug therapy: Secondary | ICD-10-CM

## 2017-12-27 DIAGNOSIS — E785 Hyperlipidemia, unspecified: Secondary | ICD-10-CM

## 2017-12-28 LAB — COMPLETE METABOLIC PANEL WITH GFR
AG Ratio: 1.4 (calc) (ref 1.0–2.5)
ALBUMIN MSPROF: 4.1 g/dL (ref 3.6–5.1)
ALKALINE PHOSPHATASE (APISO): 72 U/L (ref 40–115)
ALT: 16 U/L (ref 9–46)
AST: 16 U/L (ref 10–35)
BUN / CREAT RATIO: 16 (calc) (ref 6–22)
BUN: 22 mg/dL (ref 7–25)
CALCIUM: 9.6 mg/dL (ref 8.6–10.3)
CO2: 26 mmol/L (ref 20–32)
CREATININE: 1.34 mg/dL — AB (ref 0.70–1.18)
Chloride: 104 mmol/L (ref 98–110)
GFR, EST NON AFRICAN AMERICAN: 53 mL/min/{1.73_m2} — AB (ref >=60)
GFR, Est African American: 62 mL/min/{1.73_m2} (ref >=60)
GLOBULIN: 3 g/dL (ref 1.9–3.7)
GLUCOSE: 117 mg/dL — AB (ref 65–99)
Potassium: 3.9 mmol/L (ref 3.5–5.3)
SODIUM: 140 mmol/L (ref 135–146)
Total Bilirubin: 0.4 mg/dL (ref 0.2–1.2)
Total Protein: 7.1 g/dL (ref 6.1–8.1)

## 2017-12-28 LAB — LIPID PANEL
CHOLESTEROL: 150 mg/dL (ref <200)
HDL: 64 mg/dL (ref >40)
LDL Cholesterol (Calc): 72 mg/dL (calc)
Non-HDL Cholesterol (Calc): 86 mg/dL (calc) (ref <130)
Total CHOL/HDL Ratio: 2.3 (calc) (ref <5.0)
Triglycerides: 65 mg/dL (ref <150)

## 2017-12-28 LAB — HEMOGLOBIN A1C
Hgb A1c MFr Bld: 6.6 % of total Hgb — ABNORMAL HIGH (ref <5.7)
MEAN PLASMA GLUCOSE: 143 (calc)
eAG (mmol/L): 7.9 (calc)

## 2017-12-28 LAB — MICROALBUMIN / CREATININE URINE RATIO
CREATININE, URINE: 211 mg/dL (ref 20–320)
MICROALB UR: 7.3 mg/dL
Microalb Creat Ratio: 35 mcg/mg creat — ABNORMAL HIGH (ref <30)

## 2017-12-29 ENCOUNTER — Other Ambulatory Visit: Payer: Medicare Other | Admitting: Internal Medicine

## 2018-01-02 ENCOUNTER — Encounter: Payer: Self-pay | Admitting: Internal Medicine

## 2018-01-02 ENCOUNTER — Ambulatory Visit (INDEPENDENT_AMBULATORY_CARE_PROVIDER_SITE_OTHER): Payer: Medicare Other | Admitting: Internal Medicine

## 2018-01-02 VITALS — BP 128/82 | Ht 69.0 in

## 2018-01-02 DIAGNOSIS — E119 Type 2 diabetes mellitus without complications: Secondary | ICD-10-CM | POA: Diagnosis not present

## 2018-01-02 DIAGNOSIS — Z8546 Personal history of malignant neoplasm of prostate: Secondary | ICD-10-CM | POA: Diagnosis not present

## 2018-01-02 DIAGNOSIS — Z6841 Body Mass Index (BMI) 40.0 and over, adult: Secondary | ICD-10-CM | POA: Diagnosis not present

## 2018-01-02 DIAGNOSIS — I10 Essential (primary) hypertension: Secondary | ICD-10-CM | POA: Diagnosis not present

## 2018-01-02 DIAGNOSIS — I2510 Atherosclerotic heart disease of native coronary artery without angina pectoris: Secondary | ICD-10-CM

## 2018-01-02 DIAGNOSIS — D472 Monoclonal gammopathy: Secondary | ICD-10-CM | POA: Diagnosis not present

## 2018-01-02 DIAGNOSIS — E7849 Other hyperlipidemia: Secondary | ICD-10-CM

## 2018-01-02 DIAGNOSIS — K219 Gastro-esophageal reflux disease without esophagitis: Secondary | ICD-10-CM | POA: Diagnosis not present

## 2018-01-02 DIAGNOSIS — Z8639 Personal history of other endocrine, nutritional and metabolic disease: Secondary | ICD-10-CM

## 2018-01-02 DIAGNOSIS — R7989 Other specified abnormal findings of blood chemistry: Secondary | ICD-10-CM

## 2018-01-02 NOTE — Progress Notes (Signed)
   Subjective:    Patient ID: Leonard Pitter., male    DOB: 1946/04/23, 72 y.o.   MRN: [id number]  HPI   71 year old Male here today for 85-month recheck.  Remains overweight.  Has not been walking much with bad weather this winter.  Sees urologist yearly for follow-up of prostate cancer for which he underwent prostatectomy in 2015.  History of MGUS followed by Leonard [name] history of iron deficiency and GI source is suspected.  Received IV iron in 2014.  History of essential hypertension, diabetes mellitus, metabolic syndrome, obstructive sleep apnea, hyperlipidemia.  Dr. Wyatt Haste sees him for coronary artery disease.  History of gout and GE reflux.  History of adenomatous colon polyps.  His stepdaughter is hospitalized with pneumococcal meningitis.  She is slowly improving.  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        Objective:   Physical Exam Skin warm and dry.  Nodes none he has a 2/6 systolic ejection murmur.  Regular rate and rhythm.  No S3.  Remedies without edema.  Chest clear to auscultation without rales or wheezing.       Assessment & Plan:  Diabetes mellitus-hemoglobin A1c 6.6%.  Encourage diet exercise and weight loss  GE reflux-stable on PPI  History of MGUS followed by Dr. Julien [name]  History of iron deficiency followed by Leonard [name]  History of prostate cancer seen by Dr. Tresa [name] yearly  Essential hypertension-stable medication  Hyperlipidemia stable on statin medication  History of minimal coronary disease followed by cardiology  History of gout treated with allopurinol  Plan: Encourage diet exercise and weight loss.  Continue same medications.  Due for physical exam in 6 months.  Has had flu vaccine.

## 2018-01-02 NOTE — Patient Instructions (Addendum)
It was a pleasure to see you.  Work on diet exercise and weight loss.  Continue same medications and follow-up in 6 months.

## 2018-01-05 ENCOUNTER — Encounter: Payer: Self-pay | Admitting: Internal Medicine

## 2018-01-12 ENCOUNTER — Encounter: Payer: Self-pay | Admitting: Internal Medicine

## 2018-01-12 ENCOUNTER — Ambulatory Visit (INDEPENDENT_AMBULATORY_CARE_PROVIDER_SITE_OTHER): Payer: Medicare Other | Admitting: Internal Medicine

## 2018-01-12 VITALS — BP 124/68 | HR 71 | Ht 69.0 in | Wt 291.8 lb

## 2018-01-12 DIAGNOSIS — I10 Essential (primary) hypertension: Secondary | ICD-10-CM

## 2018-01-12 DIAGNOSIS — E782 Mixed hyperlipidemia: Secondary | ICD-10-CM

## 2018-01-12 DIAGNOSIS — I2510 Atherosclerotic heart disease of native coronary artery without angina pectoris: Secondary | ICD-10-CM

## 2018-01-12 MED ORDER — ATORVASTATIN CALCIUM 10 MG PO TABS: ORAL_TABLET | ORAL | 11 refills | Status: DC

## 2018-01-12 NOTE — Progress Notes (Signed)
Cardiology Office Note   Date:  01/12/2018   ID:  Leonard Pitter., DOB March 07, 1946, MRN [id number]  PCP:  [name], MD  Cardiologist:   Dorris Carnes, MD    F/U of CAD     History of Present Illness: Leonard [name]. is a 72 y.o. male with a history of HTN, minimal CAD at cath in  2007, sleep apnea, MGUS, prostate CA  and HL  I saw him in October 2017  SInce seen he deneis CP   Breathing is OK   Walks some but needs to do more   Has not been back to gym Daughter (49) in hosp with bacterial meningitis    Outpatient Medications Prior to Visit  Medication Sig Dispense Refill  . allopurinol (ZYLOPRIM) 300 MG tablet TAKE 1 TABLET BY MOUTH DAILY TO PREVENT GOUT 90 tablet 3  . amLODipine-benazepril (LOTREL) 10-20 MG capsule TAKE 1 CAPSULE BY MOUTH EVERY DAY 90 capsule 3  . aspirin 81 MG tablet Take 81 mg by mouth daily.    Marland Kitchen atorvastatin (LIPITOR) 10 MG tablet TAKE 1 TABLET BY MOUTH DAILY. Please keep upcoming appt for future refills. Thank you 30 tablet 0  . Avanafil (STENDRA) 200 MG TABS Take 200 mg by mouth as needed (ED).     . cyclobenzaprine (FLEXERIL) 10 MG tablet Take 1 tablet (10 mg total) at bedtime by mouth. (Patient taking differently: Take 10 mg by mouth daily as needed for muscle spasms. ) 30 tablet 0  . esomeprazole (NEXIUM) 40 MG capsule Take 1 capsule (40 mg total) by mouth daily. 90 capsule 3  . FeFum-FePoly-FA-B Cmp-C-Biot (FOLIVANE-PLUS) CAPS TAKE 1 CAPSULE BY MOUTH EVERY DAY IN THE MORNING 90 capsule 1  . ferrous sulfate 325 (65 FE) MG EC tablet Take 325 mg by mouth daily with breakfast.    . HYDROcodone-acetaminophen (NORCO) 10-325 MG tablet Take 1 tablet every 4 (four) hours as needed by mouth. (Patient taking differently: Take 1 tablet by mouth every 6 (six) hours as needed for moderate pain. ) 30 tablet 0  . HYDROcodone-homatropine (HYCODAN) 5-1.5 MG/5ML syrup Take 5 mLs by mouth every 8 (eight) hours as needed for cough. 120 mL 0  . nitroGLYCERIN (NITROSTAT)  0.4 MG SL tablet Place 0.4 mg under the tongue every 5 (five) minutes as needed for chest pain. Reported on 03/04/2016    . TRADJENTA 5 MG TABS tablet TAKE 1 TABLET BY MOUTH EVERY DAY 90 tablet 3  . triamterene-hydrochlorothiazide (MAXZIDE-25) 37.5-25 MG tablet TAKE 1 TABLET BY MOUTH EVERY DAY 30 tablet 11  . azithromycin (ZITHROMAX) 250 MG tablet 2 po day 1 followed by one po days 2-5 (Patient not taking: Reported on 01/12/2018) 6 tablet 0   No facility-administered medications prior to visit.      Allergies:   Dicloxacillin; Cayenne pepper [cayenne]; and Nsaids   Past Medical History:  Diagnosis Date  . 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 Coronary artery disease   . Diverticulosis of colon (without mention of hemorrhage) 09/05/2006   Colonoscopy   . Gastric polyp 2014   done at [facility]  . GERD (gastroesophageal reflux disease) 07/26/2001   EGD(Chronic)  . Gouty arthropathy   . Hemorrhoids   . Hiatal hernia 09/05/2006   EGD  . Hx of colonic polyps 09/05/2006   Colonoscopy(ADENOMATOUS POLYP)  . Iron deficiency anemia, unspecified   . Monoclonal gammopathy   . OSA (obstructive sleep apnea)  USES CPAP MACHINE   . Other and unspecified hyperlipidemia   . Prostate cancer ([place]) 2015  . Type II or unspecified type diabetes mellitus without mention of complication, not stated as uncontrolled   . Unspecified essential hypertension     Past Surgical History:  Procedure Laterality Date  . ESOPHAGOGASTRODUODENOSCOPY     Done at [facility]  . ESOPHAGOGASTRODUODENOSCOPY (EGD) WITH PROPOFOL N/A 11/04/2017   Procedure: ESOPHAGOGASTRODUODENOSCOPY (EGD) WITH PROPOFOL;  Surgeon: Jerene Bears, MD;  Location: [facility] ENDOSCOPY;  Service: Gastroenterology;  Laterality: N/A;  . GI PROSTATE BIOPSY    . HEMORRHOID SURGERY    . INCISIONAL HERNIA REPAIR N/A 08/07/2015   Procedure: LAPAROSCOPIC INCISIONAL HERNIA repair with mesh;  Surgeon: Arta Bruce Kinsinger, MD;  Location: [facility]  ORS;  Service: General;  Laterality: N/A;  . KNEE SURGERY  1999   TUMOR REMOVED LEFT KNEE & ARTHROSCOPIC SURGERY  . LYMPHADENECTOMY Bilateral 05/01/2014   Procedure: LYMPHADENECTOMY;  Surgeon: Alexis Frock, MD;  Location: [facility] ORS;  Service: Urology;  Laterality: Bilateral;  . ROBOT ASSISTED LAPAROSCOPIC RADICAL PROSTATECTOMY N/A 05/01/2014   Procedure: ROBOTIC ASSISTED LAPAROSCOPIC RADICAL PROSTATECTOMY WITH INDOCYANINE GREEN DYE;  Surgeon: Alexis Frock, MD;  Location: [facility] ORS;  Service: Urology;  Laterality: N/A;     Social History:  The patient  reports that  has never smoked. he has never used smokeless tobacco. He reports that he does not drink alcohol or use drugs.   Family History:  The patient's family history includes Diabetes in his brother and mother; Heart attack in his father.    ROS:  Please see the history of present illness. All other systems are reviewed and  Negative to the above problem except as noted.    PHYSICAL EXAM: VS:  BP 124/68   Pulse 71   Ht 5\' 9"  (1.753 m)   Wt 291 lb 12.8 oz (132.4 kg)   BMI 43.09 kg/m   GEN Morbidly obese 72 yo , in no acute distress HEENT: normal Neck: no JVD, carotid bruits, or masses Cardiac: RRR; no murmurs, rubs, or gallops,no edema  Respiratory:  clear to auscultation bilaterally, normal work of breathing GI: soft, nontender, nondistended, + BS  No hepatomegaly  MS: no deformity Moving all extremities   Skin: warm and dry, no rash Neuro:  Strength and sensation are intact Psych: euthymic mood, full affect   EKG:  EKG is ordered today.  SR 71 bpm  LVH  Possible IWMI   Lipid Panel    Component Value Date/Time   CHOL 150 12/27/2017 0942   TRIG 65 12/27/2017 0942   HDL 64 12/27/2017 0942   CHOLHDL 2.3 12/27/2017 0942   VLDL 18 07/01/2017 1036   LDLCALC 62 07/01/2017 1036      Wt Readings from Last 3 Encounters:  01/12/18 291 lb 12.8 oz (132.4 kg)  11/22/17 289 lb (131.1 kg)  11/04/17 294 lb (133.4 kg)       ASSESSMENT AND PLAN:  1  CAD  No symptoms of angina  Encouraged him to increase his acitvity    2 HTN   BP is good  Keep on same meds  Labs OK   3  LIpdis  OK  LDL 72 on recent check HDL 64  Good  Keep on lipitor 10 Discussed wt loss    4  Exercise  Encouraged to increase activity     5  Heme  Follow in e  F/u 1 year      Current medicines are  reviewed at length with the patient today.  The patient does not have concerns regarding medicines.  Signed, Dorris Carnes, MD  01/12/2018 9:48 AM    [address], [place], [place]  [address] Phone: [phone]; Fax: [phone]

## 2018-01-12 NOTE — Patient Instructions (Signed)
Your physician recommends that you continue on your current medications as directed. Please refer to the Current Medication list given to you today. Your physician wants you to follow-up in: 1 year with Dr. Ross.  You will receive a reminder letter in the mail two months in advance. If you don't receive a letter, please call our office to schedule the follow-up appointment.  

## 2018-02-27 ENCOUNTER — Other Ambulatory Visit: Payer: Self-pay | Admitting: Internal Medicine

## 2018-03-09 ENCOUNTER — Other Ambulatory Visit: Payer: Self-pay | Admitting: Internal Medicine

## 2018-04-28 ENCOUNTER — Other Ambulatory Visit: Payer: Self-pay | Admitting: Internal Medicine

## 2018-04-28 DIAGNOSIS — D649 Anemia, unspecified: Secondary | ICD-10-CM

## 2018-05-30 ENCOUNTER — Other Ambulatory Visit: Payer: Self-pay | Admitting: Internal Medicine

## 2018-06-30 ENCOUNTER — Other Ambulatory Visit: Payer: Self-pay | Admitting: Internal Medicine

## 2018-06-30 DIAGNOSIS — I10 Essential (primary) hypertension: Secondary | ICD-10-CM

## 2018-06-30 DIAGNOSIS — Z8546 Personal history of malignant neoplasm of prostate: Secondary | ICD-10-CM

## 2018-06-30 DIAGNOSIS — E119 Type 2 diabetes mellitus without complications: Secondary | ICD-10-CM

## 2018-06-30 DIAGNOSIS — E7849 Other hyperlipidemia: Secondary | ICD-10-CM

## 2018-06-30 DIAGNOSIS — I2510 Atherosclerotic heart disease of native coronary artery without angina pectoris: Secondary | ICD-10-CM

## 2018-06-30 DIAGNOSIS — D472 Monoclonal gammopathy: Secondary | ICD-10-CM

## 2018-06-30 DIAGNOSIS — Z8639 Personal history of other endocrine, nutritional and metabolic disease: Secondary | ICD-10-CM

## 2018-06-30 DIAGNOSIS — Z0000 Encounter for general adult medical examination without abnormal findings: Secondary | ICD-10-CM

## 2018-06-30 DIAGNOSIS — K219 Gastro-esophageal reflux disease without esophagitis: Secondary | ICD-10-CM

## 2018-06-30 DIAGNOSIS — R7989 Other specified abnormal findings of blood chemistry: Secondary | ICD-10-CM

## 2018-07-04 ENCOUNTER — Other Ambulatory Visit: Payer: Medicare Other | Admitting: Internal Medicine

## 2018-07-04 DIAGNOSIS — E7849 Other hyperlipidemia: Secondary | ICD-10-CM | POA: Diagnosis not present

## 2018-07-04 DIAGNOSIS — E119 Type 2 diabetes mellitus without complications: Secondary | ICD-10-CM

## 2018-07-04 DIAGNOSIS — K219 Gastro-esophageal reflux disease without esophagitis: Secondary | ICD-10-CM

## 2018-07-04 DIAGNOSIS — Z8639 Personal history of other endocrine, nutritional and metabolic disease: Secondary | ICD-10-CM

## 2018-07-04 DIAGNOSIS — R7989 Other specified abnormal findings of blood chemistry: Secondary | ICD-10-CM

## 2018-07-04 DIAGNOSIS — Z8546 Personal history of malignant neoplasm of prostate: Secondary | ICD-10-CM | POA: Diagnosis not present

## 2018-07-04 DIAGNOSIS — I2510 Atherosclerotic heart disease of native coronary artery without angina pectoris: Secondary | ICD-10-CM

## 2018-07-04 DIAGNOSIS — D472 Monoclonal gammopathy: Secondary | ICD-10-CM | POA: Diagnosis not present

## 2018-07-04 DIAGNOSIS — Z0000 Encounter for general adult medical examination without abnormal findings: Secondary | ICD-10-CM

## 2018-07-04 DIAGNOSIS — I10 Essential (primary) hypertension: Secondary | ICD-10-CM | POA: Diagnosis not present

## 2018-07-05 LAB — LIPID PANEL
CHOL/HDL RATIO: 2.2 (calc) (ref <5.0)
Cholesterol: 144 mg/dL (ref <200)
HDL: 65 mg/dL (ref >40)
LDL CHOLESTEROL (CALC): 63 mg/dL
Non-HDL Cholesterol (Calc): 79 mg/dL (calc) (ref <130)
Triglycerides: 77 mg/dL (ref <150)

## 2018-07-05 LAB — CBC WITH DIFFERENTIAL/PLATELET
BASOS PCT: 0.2 %
Basophils Absolute: 22 cells/uL (ref 0–200)
EOS ABS: 205 {cells}/uL (ref 15–500)
Eosinophils Relative: 1.9 %
HCT: 36.5 % — ABNORMAL LOW (ref 38.5–50.0)
HEMOGLOBIN: 12.2 g/dL — AB (ref 13.2–17.1)
Lymphs Abs: 3661 cells/uL (ref 850–3900)
MCH: 31 pg (ref 27.0–33.0)
MCHC: 33.4 g/dL (ref 32.0–36.0)
MCV: 92.6 fL (ref 80.0–100.0)
MONOS PCT: 6.1 %
MPV: 9.7 fL (ref 7.5–12.5)
NEUTROS ABS: 6253 {cells}/uL (ref 1500–7800)
Neutrophils Relative %: 57.9 %
Platelets: 301 10*3/uL (ref 140–400)
RBC: 3.94 10*6/uL — ABNORMAL LOW (ref 4.20–5.80)
RDW: 14.6 % (ref 11.0–15.0)
Total Lymphocyte: 33.9 %
WBC: 10.8 10*3/uL (ref 3.8–10.8)
WBCMIX: 659 {cells}/uL (ref 200–950)

## 2018-07-05 LAB — COMPLETE METABOLIC PANEL WITH GFR
AG Ratio: 1.3 (calc) (ref 1.0–2.5)
ALT: 17 U/L (ref 9–46)
AST: 15 U/L (ref 10–35)
Albumin: 4.1 g/dL (ref 3.6–5.1)
Alkaline phosphatase (APISO): 78 U/L (ref 40–115)
BUN/Creatinine Ratio: 19 (calc) (ref 6–22)
BUN: 24 mg/dL (ref 7–25)
CALCIUM: 9.4 mg/dL (ref 8.6–10.3)
CO2: 29 mmol/L (ref 20–32)
CREATININE: 1.27 mg/dL — AB (ref 0.70–1.18)
Chloride: 102 mmol/L (ref 98–110)
GFR, EST NON AFRICAN AMERICAN: 57 mL/min/{1.73_m2} — AB (ref >=60)
GFR, Est African American: 66 mL/min/{1.73_m2} (ref >=60)
Globulin: 3.2 g/dL (calc) (ref 1.9–3.7)
Glucose, Bld: 110 mg/dL — ABNORMAL HIGH (ref 65–99)
POTASSIUM: 4.5 mmol/L (ref 3.5–5.3)
Sodium: 139 mmol/L (ref 135–146)
Total Bilirubin: 0.4 mg/dL (ref 0.2–1.2)
Total Protein: 7.3 g/dL (ref 6.1–8.1)

## 2018-07-05 LAB — HEMOGLOBIN A1C
HEMOGLOBIN A1C: 6.7 %{Hb} — AB (ref <5.7)
MEAN PLASMA GLUCOSE: 146 (calc)
eAG (mmol/L): 8.1 (calc)

## 2018-07-05 LAB — MICROALBUMIN / CREATININE URINE RATIO
Creatinine, Urine: 127 mg/dL (ref 20–320)
MICROALB/CREAT RATIO: 30 ug/mg{creat} — AB (ref <30)
Microalb, Ur: 3.8 mg/dL

## 2018-07-05 LAB — PSA

## 2018-07-07 ENCOUNTER — Ambulatory Visit (INDEPENDENT_AMBULATORY_CARE_PROVIDER_SITE_OTHER): Payer: Medicare Other | Admitting: Internal Medicine

## 2018-07-07 ENCOUNTER — Encounter: Payer: Self-pay | Admitting: Internal Medicine

## 2018-07-07 VITALS — BP 110/70 | HR 90 | Ht 69.0 in | Wt 284.0 lb

## 2018-07-07 DIAGNOSIS — B360 Pityriasis versicolor: Secondary | ICD-10-CM

## 2018-07-07 DIAGNOSIS — Z87442 Personal history of urinary calculi: Secondary | ICD-10-CM

## 2018-07-07 DIAGNOSIS — Z8719 Personal history of other diseases of the digestive system: Secondary | ICD-10-CM

## 2018-07-07 DIAGNOSIS — I2510 Atherosclerotic heart disease of native coronary artery without angina pectoris: Secondary | ICD-10-CM

## 2018-07-07 DIAGNOSIS — I10 Essential (primary) hypertension: Secondary | ICD-10-CM | POA: Diagnosis not present

## 2018-07-07 DIAGNOSIS — Z8546 Personal history of malignant neoplasm of prostate: Secondary | ICD-10-CM | POA: Diagnosis not present

## 2018-07-07 DIAGNOSIS — R809 Proteinuria, unspecified: Secondary | ICD-10-CM

## 2018-07-07 DIAGNOSIS — Z6841 Body Mass Index (BMI) 40.0 and over, adult: Secondary | ICD-10-CM

## 2018-07-07 DIAGNOSIS — K219 Gastro-esophageal reflux disease without esophagitis: Secondary | ICD-10-CM

## 2018-07-07 DIAGNOSIS — Z0000 Encounter for general adult medical examination without abnormal findings: Secondary | ICD-10-CM

## 2018-07-07 DIAGNOSIS — M19012 Primary osteoarthritis, left shoulder: Secondary | ICD-10-CM

## 2018-07-07 DIAGNOSIS — D472 Monoclonal gammopathy: Secondary | ICD-10-CM

## 2018-07-07 DIAGNOSIS — E7849 Other hyperlipidemia: Secondary | ICD-10-CM | POA: Diagnosis not present

## 2018-07-07 DIAGNOSIS — Z86010 Personal history of colonic polyps: Secondary | ICD-10-CM

## 2018-07-07 DIAGNOSIS — E119 Type 2 diabetes mellitus without complications: Secondary | ICD-10-CM

## 2018-07-07 DIAGNOSIS — N183 Chronic kidney disease, stage 3 unspecified: Secondary | ICD-10-CM

## 2018-07-07 DIAGNOSIS — Z8639 Personal history of other endocrine, nutritional and metabolic disease: Secondary | ICD-10-CM | POA: Diagnosis not present

## 2018-07-07 DIAGNOSIS — G5603 Carpal tunnel syndrome, bilateral upper limbs: Secondary | ICD-10-CM

## 2018-07-07 LAB — POCT URINALYSIS DIPSTICK
APPEARANCE: NORMAL
BILIRUBIN UA: NEGATIVE
GLUCOSE UA: NEGATIVE
Ketones, UA: NEGATIVE
LEUKOCYTES UA: NEGATIVE
Nitrite, UA: NEGATIVE
ODOR: NORMAL
PH UA: 6 (ref 5.0–8.0)
Protein, UA: POSITIVE — AB
RBC UA: NEGATIVE
Spec Grav, UA: 1.015 (ref 1.010–1.025)
Urobilinogen, UA: 0.2 E.U./dL

## 2018-07-15 NOTE — Progress Notes (Signed)
Subjective:    Patient ID: Leonard [name]., male    DOB: May 20, 1946, 72 y.o.   MRN: [id number]  HPI 72 year old black male in today for Medicare wellness, health care maintenance and evaluation of medical issues.  History of benign myopathy and iron deficiency treated and followed by Dr. Earlie Server.  History of iron deficiency with suspected GI source.  Received IV iron in 2014.  He underwent surgery for prostate cancer June 2015 and is followed by Dr. Tammi Klippel.  Essential hypertension, diabetes mellitus, obesity, metabolic syndrome, hyperlipidemia.  History of obstructive sleep apnea.  Followed by Dr. Dorris Carnes for coronary artery disease.  He had minimal coronary artery disease on catheterization in 2007.  History of gout  Long-standing history of GE reflux treated with PPI  History of adenomatous colon polyp  History of tinea versicolor  History of bilateral carpal tunnel syndrome  History of peripheral neuropathy  History of low testosterone  His BMI is 41.940 weighs 284 pounds  Social history: He is retired from the [employer].  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  He is married.  Does not smoke or consume alcohol.  This past winter he lost his stepdaughter due to complications of meningitis and this was devastating.  She was living with patient and his wife.  Family history: Father died of an MI.  Mother died of an MI.  2 brothers with history of diabetes.  One sister.  Mother had diabetes.  Both parents had hypertension.   Review of Systems  Constitutional: Negative.   Respiratory: Negative.   Cardiovascular: Negative.   Gastrointestinal: Negative.   Genitourinary: Negative.   Musculoskeletal:       Recent development of left shoulder pain       Objective:   Physical Exam  Constitutional: He is oriented to person, place, and time. He appears well-developed and well-nourished.  HENT:  Head: Normocephalic and atraumatic.  Right Ear:  External ear normal.  Left Ear: External ear normal.  Mouth/Throat: Oropharynx is clear and moist.  Eyes: Pupils are equal, round, and reactive to light. Conjunctivae and EOM are normal. Right eye exhibits no discharge. Left eye exhibits no discharge. No scleral icterus.  Neck: No JVD present. No thyromegaly present.  Cardiovascular: Normal rate, regular rhythm, normal heart sounds and intact distal pulses.  No murmur heard. Pulmonary/Chest: Effort normal and breath sounds normal. No stridor. No respiratory distress.  Abdominal: Soft. He exhibits no distension and no mass. There is no tenderness. There is no rebound and no guarding.  Genitourinary:  Genitourinary Comments: Deferred status post prostatectomy  Musculoskeletal: He exhibits no edema.  Lymphadenopathy:    He has no cervical adenopathy.  Neurological: He is oriented to person, place, and time.  Skin: Skin is warm and dry.  Psychiatric: He has a normal mood and affect. His behavior is normal. Judgment and thought content normal.  Vitals reviewed.         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 Earlie Server  Hyperlipidemia-lipid panel is normal  History  of kidney stones  Obstructive sleep apnea  Peripheral neuropathy  History of bilateral carpal tunnel syndrome  History of gout  History of tinea versicolor  Nonocclusive coronary disease followed by cardiology  GE reflux treated with PPI  History of gastritis treated with  chronic PPI  History of adenomatous colon polyp  History of angioedema  Microalbuminuria  Subjective:   Patient presents for Medicare Annual/Subsequent preventive examination.  Review Past Medical/Family/Social: See above   Risk Factors  Current exercise habits: Does not get a lot of exercise walks some Dietary issues discussed: Low-fat low carbohydrate  Cardiac risk factors: Hyperlipidemia, hypertension, diabetes mellitus, personal and family history  Depression Screen  (Note: if answer to either of the following is "Yes", a more complete depression screening is indicated)   Over the past two weeks, have you felt down, depressed or hopeless? No  Over the past two weeks, have you felt little interest or pleasure in doing things? No Have you lost interest or pleasure in daily life? No Do you often feel hopeless? No Do you cry easily over simple problems? No   Activities of Daily Living  In your present state of health, do you have any difficulty performing the following activities?:   Driving? No  Managing money? No  Feeding yourself? No  Getting from bed to chair? No  Climbing a flight of stairs? No  Preparing food and eating?: No  Bathing or showering? No  Getting dressed: No  Getting to the toilet? No  Using the toilet:No  Moving around from place to place: No  In the past year have you fallen or had a near fall?:No  Are you sexually active?  Yes Do you have more than one partner? No   Hearing Difficulties: No  Do you often ask people to speak up or repeat themselves? No  Do you experience ringing or noises in your ears? No  Do you have difficulty understanding soft or whispered voices? No  Do you feel that you have a problem with memory? No Do you often misplace items? No    Home Safety:  Do you have a smoke alarm at your residence? Yes Do you have grab bars in the bathroom?  No Do you have throw rugs in your house?  Yes   Cognitive Testing  Alert? Yes  Normal Appearance?Yes  Oriented to person? Yes Place? Yes  Time? Yes  Recall of three objects? Yes  Can perform simple calculations? Yes  Displays appropriate judgment?Yes  Can read the correct time from a watch face?Yes   List the Names of Other Physician/Practitioners you currently use:  See referral list for the physicians patient is currently seeing.     Review of Systems: See above   Objective:     General appearance: Appears stated age and morbidly obese  Head: Normocephalic, without obvious abnormality, atraumatic  Eyes: conj clear, EOMi PEERLA  Ears: normal TM's and external ear canals both ears  Nose: Nares normal. Septum midline. Mucosa normal. No drainage or sinus tenderness.  Throat: lips, mucosa, and tongue normal; teeth and gums normal  Neck: no adenopathy, no carotid bruit, no JVD, supple, symmetrical, trachea midline and thyroid not enlarged, symmetric, no tenderness/mass/nodules  No CVA tenderness.  Lungs: clear to auscultation bilaterally  Breasts: normal appearance, no masses or tenderness Heart: regular rate and rhythm, S1, S2 normal, no murmur, click, rub or gallop  Abdomen: soft, non-tender; bowel sounds normal; no masses, no organomegaly  Musculoskeletal: ROM normal in all  joints, no crepitus, no deformity, Normal muscle strengthen. Back  is symmetric, no curvature. Skin: Skin color, texture, turgor normal. No rashes or lesions  Lymph nodes: Cervical, supraclavicular, and axillary nodes normal.  Neurologic: CN 2 -12 Normal, Normal symmetric reflexes. Normal coordination and gait  Psych: Alert & Oriented x 3, Mood appear stable.    Assessment:    Annual wellness medicare exam   Plan:    During the course of the visit the patient was educated and counseled about appropriate screening and preventive services including:        Patient Instructions (the written plan) was given to the patient.  Medicare Attestation  I have personally reviewed:    The patient's medical and social history  Their use of alcohol, tobacco or illicit drugs  Their current medications and supplements  The patient's functional ability including ADLs,fall risks, home safety risks, cognitive, and hearing and visual impairment  Diet and physical activities  Evidence for depression or mood disorders  The patient's weight, height, BMI, and visual acuity have been recorded in the chart. I have made referrals, counseling, and provided education to the patient based on review of the above and I have provided the patient with a written personalized care plan for preventive services.

## 2018-07-15 NOTE — Patient Instructions (Signed)
It was pleasure to see you today.  Please try to diet exercise and lose some weight.  Consider Dr. Migdalia Dk weight loss clinic.  Return in 6 months.

## 2018-08-02 ENCOUNTER — Other Ambulatory Visit: Payer: Self-pay | Admitting: Internal Medicine

## 2018-08-08 ENCOUNTER — Ambulatory Visit (INDEPENDENT_AMBULATORY_CARE_PROVIDER_SITE_OTHER): Payer: Medicare Other | Admitting: Internal Medicine

## 2018-08-08 DIAGNOSIS — Z23 Encounter for immunization: Secondary | ICD-10-CM

## 2018-08-08 DIAGNOSIS — I2510 Atherosclerotic heart disease of native coronary artery without angina pectoris: Secondary | ICD-10-CM

## 2018-08-08 NOTE — Patient Instructions (Signed)
Patient received a flu vaccine IM left deltoid.

## 2018-08-08 NOTE — Progress Notes (Signed)
Flu vaccine given by CMA 

## 2018-08-14 ENCOUNTER — Encounter: Payer: Self-pay | Admitting: Internal Medicine

## 2018-08-14 ENCOUNTER — Ambulatory Visit
Admission: RE | Admit: 2018-08-14 | Discharge: 2018-08-14 | Disposition: A | Discharge status: Home / Self Care | Payer: Medicare Other | Source: Ambulatory Visit | Attending: Internal Medicine | Admitting: Internal Medicine

## 2018-08-14 ENCOUNTER — Other Ambulatory Visit: Payer: Self-pay | Admitting: Internal Medicine

## 2018-08-14 ENCOUNTER — Ambulatory Visit (INDEPENDENT_AMBULATORY_CARE_PROVIDER_SITE_OTHER): Payer: Medicare Other | Admitting: Internal Medicine

## 2018-08-14 VITALS — BP 120/62 | HR 71 | Temp 98.0°F | Ht 69.0 in | Wt 288.0 lb

## 2018-08-14 DIAGNOSIS — R319 Hematuria, unspecified: Secondary | ICD-10-CM

## 2018-08-14 DIAGNOSIS — R1030 Lower abdominal pain, unspecified: Secondary | ICD-10-CM

## 2018-08-14 DIAGNOSIS — I2510 Atherosclerotic heart disease of native coronary artery without angina pectoris: Secondary | ICD-10-CM | POA: Diagnosis not present

## 2018-08-14 DIAGNOSIS — R8290 Unspecified abnormal findings in urine: Secondary | ICD-10-CM

## 2018-08-14 DIAGNOSIS — R109 Unspecified abdominal pain: Secondary | ICD-10-CM | POA: Diagnosis not present

## 2018-08-14 DIAGNOSIS — R1032 Left lower quadrant pain: Secondary | ICD-10-CM

## 2018-08-14 LAB — LIPASE: Lipase: 14 U/L (ref 7–60)

## 2018-08-14 LAB — CBC WITH DIFFERENTIAL/PLATELET
BASOS PCT: 0.3 %
Basophils Absolute: 34 cells/uL (ref 0–200)
EOS ABS: 125 {cells}/uL (ref 15–500)
EOS PCT: 1.1 %
HCT: 35.4 % — ABNORMAL LOW (ref 38.5–50.0)
HEMOGLOBIN: 11.9 g/dL — AB (ref 13.2–17.1)
Lymphs Abs: 3363 cells/uL (ref 850–3900)
MCH: 30.9 pg (ref 27.0–33.0)
MCHC: 33.6 g/dL (ref 32.0–36.0)
MCV: 91.9 fL (ref 80.0–100.0)
MONOS PCT: 5.9 %
MPV: 9.7 fL (ref 7.5–12.5)
NEUTROS ABS: 7205 {cells}/uL (ref 1500–7800)
Neutrophils Relative %: 63.2 %
PLATELETS: 305 10*3/uL (ref 140–400)
RBC: 3.85 10*6/uL — AB (ref 4.20–5.80)
RDW: 14.8 % (ref 11.0–15.0)
TOTAL LYMPHOCYTE: 29.5 %
WBC mixed population: 673 cells/uL (ref 200–950)
WBC: 11.4 10*3/uL — AB (ref 3.8–10.8)

## 2018-08-14 LAB — POCT URINALYSIS DIPSTICK
Appearance: NEGATIVE
Bilirubin, UA: NEGATIVE
Glucose, UA: NEGATIVE
Ketones, UA: NEGATIVE
Leukocytes, UA: NEGATIVE
NITRITE UA: NEGATIVE
Odor: NEGATIVE
Protein, UA: POSITIVE — AB
SPEC GRAV UA: 1.01 (ref 1.010–1.025)
UROBILINOGEN UA: 0.2 U/dL
pH, UA: 6.5 (ref 5.0–8.0)

## 2018-08-14 LAB — BASIC METABOLIC PANEL
BUN/Creatinine Ratio: 15 (calc) (ref 6–22)
BUN: 19 mg/dL (ref 7–25)
CO2: 25 mmol/L (ref 20–32)
CREATININE: 1.25 mg/dL — AB (ref 0.70–1.18)
Calcium: 9.5 mg/dL (ref 8.6–10.3)
Chloride: 103 mmol/L (ref 98–110)
GLUCOSE: 120 mg/dL — AB (ref 65–99)
POTASSIUM: 4 mmol/L (ref 3.5–5.3)
Sodium: 139 mmol/L (ref 135–146)

## 2018-08-14 LAB — AMYLASE: AMYLASE: 62 U/L (ref 21–101)

## 2018-08-14 NOTE — Progress Notes (Signed)
   Subjective:    Patient ID: Leonard [name]., male    DOB: [birth year], 72 y.o.   MRN: [id number]  HPI 72 year old Male with complaint of lower abdominal pain for 2 to 3 weeks.  He is vague about his symptoms.  Says there are no issues with bowel movements.  No dysuria or urinary discomfort.  He is able to eat.  No vomiting or nausea.  Addendum: Urine specimen  Dr. Hilarie Fredrickson is his Gastroenterologist.  He had colonoscopy in 2013.  He had endoscopy for anemia in 2018.  History of robotic assisted radical prostatectomy by Dr. Tresa Moore in 2015.  In September 2016 he had laparoscopic incisional hernia repair with mesh by Dr. Kieth Brightly.  He says this discomfort he is having in his lower abdomen reminds him of the problem he was having in 2016 when he had the incisional hernia repair.  In 2014 he had upper endoscopy at [facility] and a single large gastric polyp was resected.  Pathology showed this to be a hyperplastic type polyp.  History of adenomatous colon polyp.    Review of Systems no nausea and vomiting.  He has a history of MGUS followed by Hematology     Objective:   Physical Exam  Abdomen is obese nondistended without hepatosplenomegaly masses or significant tenderness.  He has some mild point tenderness just below the umbilicus slightly to the left.  There is no significant rebound tenderness but he is definitely tender just below the umbilicus to the left.      Assessment & Plan:  Lower abdominal pain x3 weeks.  No fever or chills.  Will get KUB flat and upright abdominal pain film.  Check CBC with differential, amylase and lipase.  Suspect he will need CT of abdomen and pelvis to see if he has diverticulitis, mass, obstruction.  Addendum: Urine specimen shows trace occult blood.  Will be sent for microscopic and culture.  Protein is present in his urine.  KUB shows no evidence of obstruction.  Proceed with CT of the abdomen and pelvis.

## 2018-08-14 NOTE — Patient Instructions (Addendum)
To have CBC diff amylase lipase,B-met, urine specimen. To have KUB.  Further instructions to follow.

## 2018-08-15 ENCOUNTER — Telehealth: Payer: Self-pay | Admitting: Emergency Medicine

## 2018-08-15 MED ORDER — ESOMEPRAZOLE MAGNESIUM 40 MG PO CPDR: DELAYED_RELEASE_CAPSULE | ORAL | 11 refills | Status: DC

## 2018-08-15 NOTE — Telephone Encounter (Signed)
Please call in for one year. Has his CT of abdomen been approved and scheduled?

## 2018-08-15 NOTE — Telephone Encounter (Signed)
Pt called and request a 90 day supply refill of his esomeprazole (NEXIUM) 40 MG capsule. Pharmacy is [facility] [place]. Thanks.

## 2018-08-15 NOTE — Addendum Note (Signed)
Addended by: Mady Haagensen on: 08/15/2018 02:21 PM   Modules accepted: Orders

## 2018-08-16 ENCOUNTER — Telehealth: Payer: Self-pay

## 2018-08-16 LAB — URINALYSIS, MICROSCOPIC ONLY
Bacteria, UA: NONE SEEN /HPF
Hyaline Cast: NONE SEEN /LPF
RBC / HPF: NONE SEEN /HPF (ref 0–2)
SQUAMOUS EPITHELIAL / LPF: NONE SEEN /HPF (ref <=5)
WBC, UA: NONE SEEN /HPF (ref 0–5)

## 2018-08-16 LAB — URINE CULTURE
MICRO NUMBER:: 91170985
RESULT: NO GROWTH
SPECIMEN QUALITY:: ADEQUATE

## 2018-08-16 NOTE — Telephone Encounter (Signed)
Notified patient of CT scan appt made for 08/22/18 at 11:40am, patient was advised to fast for 4 hours prior to his test and to pick up some contrast media at least 2 days prior to his appt. Pt verbalized understanding.

## 2018-08-17 ENCOUNTER — Telehealth: Payer: Self-pay

## 2018-08-17 ENCOUNTER — Ambulatory Visit (HOSPITAL_COMMUNITY)
Admission: RE | Admit: 2018-08-17 | Discharge: 2018-08-17 | Disposition: A | Discharge status: Home / Self Care | Payer: Medicare Other | Source: Ambulatory Visit | Attending: Internal Medicine | Admitting: Internal Medicine

## 2018-08-17 ENCOUNTER — Other Ambulatory Visit: Payer: Self-pay | Admitting: Internal Medicine

## 2018-08-17 DIAGNOSIS — I700 Atherosclerosis of aorta: Secondary | ICD-10-CM | POA: Insufficient documentation

## 2018-08-17 DIAGNOSIS — K5730 Diverticulosis of large intestine without perforation or abscess without bleeding: Secondary | ICD-10-CM | POA: Diagnosis not present

## 2018-08-17 DIAGNOSIS — R1011 Right upper quadrant pain: Secondary | ICD-10-CM | POA: Diagnosis not present

## 2018-08-17 DIAGNOSIS — K449 Diaphragmatic hernia without obstruction or gangrene: Secondary | ICD-10-CM | POA: Diagnosis not present

## 2018-08-17 DIAGNOSIS — K439 Ventral hernia without obstruction or gangrene: Secondary | ICD-10-CM | POA: Insufficient documentation

## 2018-08-17 DIAGNOSIS — E279 Disorder of adrenal gland, unspecified: Secondary | ICD-10-CM | POA: Diagnosis not present

## 2018-08-17 MED ORDER — IOHEXOL 300 MG/ML  SOLN
100.0000 mL | Freq: Once | INTRAMUSCULAR | Status: AC | PRN
  Administered 2018-08-17: 100 mL via INTRAVENOUS

## 2018-08-17 NOTE — Telephone Encounter (Signed)
Left detailed message pt to go to [facility] for a stat ct scan NPO for 4 hrs needs to be there today at 11:45am. I have cancelled appt at GI

## 2018-08-17 NOTE — Telephone Encounter (Signed)
Called patient with CT scan results. Patient was notified to call if abd pain persists and we will refer him to GI, patient verbalized understanding.

## 2018-08-22 ENCOUNTER — Other Ambulatory Visit: Payer: Medicare Other

## 2018-08-23 ENCOUNTER — Ambulatory Visit: Payer: Medicare Other | Admitting: Pulmonary Disease

## 2018-08-23 ENCOUNTER — Other Ambulatory Visit: Payer: Self-pay | Admitting: Internal Medicine

## 2018-09-07 ENCOUNTER — Encounter: Payer: Self-pay | Admitting: Pulmonary Disease

## 2018-09-07 ENCOUNTER — Ambulatory Visit (INDEPENDENT_AMBULATORY_CARE_PROVIDER_SITE_OTHER): Payer: Medicare Other | Admitting: Pulmonary Disease

## 2018-09-07 VITALS — BP 126/74 | HR 82 | Ht 69.0 in | Wt 293.0 lb

## 2018-09-07 DIAGNOSIS — Z9989 Dependence on other enabling machines and devices: Secondary | ICD-10-CM

## 2018-09-07 DIAGNOSIS — I2510 Atherosclerotic heart disease of native coronary artery without angina pectoris: Secondary | ICD-10-CM | POA: Diagnosis not present

## 2018-09-07 DIAGNOSIS — E669 Obesity, unspecified: Secondary | ICD-10-CM

## 2018-09-07 DIAGNOSIS — G4733 Obstructive sleep apnea (adult) (pediatric): Secondary | ICD-10-CM | POA: Diagnosis not present

## 2018-09-07 DIAGNOSIS — G4730 Sleep apnea, unspecified: Secondary | ICD-10-CM

## 2018-09-07 NOTE — Patient Instructions (Signed)
Will have [facility] change your CPAP to 11 cm H2O  Follow up in 1 year

## 2018-09-07 NOTE — Progress Notes (Signed)
[facility]  Chief Complaint  Patient presents with  . Follow-up    for OSA. Denies any issues with CPAP or sleeping. Uses [facility] as his DME.     Constitutional:  BP 126/74 (BP Location: Left Arm, Patient Position: Sitting, Cuff Size: Normal)   Pulse 82   Ht 5\' 9"  (1.753 m)   Wt 293 lb (132.9 kg)   SpO2 95%   BMI 43.27 kg/m   Past Medical History:  CAD, HTN, HLD, Diverticulosis, GERD, HH, Prostate cancer, DM, Gout  Brief Summary:  Leonard [name]. is a 72 y.o. male with obstructive sleep apnea.  I last saw him in 2017.  He had sleep study from 2008 that showed moderate to severe obstructive sleep apnea.  He was using CPAP 10 cm H2O.  He has a full face mask.  No issue with mask fit.  Denies sore throat, dry mouth, sinus congestion, or aerophagia.  He gets about 5 to 6 hours sleep per night.  Sometimes naps during the day.  Plans to start walking more, but limited by knee pains.   Physical Exam:   Appearance - well kempt  ENMT - clear nasal mucosa, midline nasal septum, no oral exudates, no LAN, trachea midline Respiratory - normal chest wall, normal respiratory effort, no accessory muscle use, no wheeze/rales CV - s1s2 regular rate and rhythm, no murmurs, no peripheral edema, radial pulses symmetric GI - soft, non tender, no masses Lymph - no adenopathy noted in neck and axillary areas MSK - normal muscle strength and tone, normal gait Ext - no cyanosis, clubbing, or joint inflammation noted Skin - no rashes, lesions, or ulcers Neuro - normal strength Psych - normal mood and affect   Assessment/Plan:   Obstructive sleep apnea. - he is compliant with CPAP and reports benefit from therapy - will change his CPAP setting from 10 to 11 cm H2O  Obesity. - he plans to start a gradual exercise regimen    Patient Instructions  Will have [facility] change your CPAP to 11 cm H2O  Follow up in 1 year    Chesley Mires, MD [place] Pager: [phone] 09/07/2018, 10:18 AM  Flow Sheet      Sleep tests:  PSG 10/19/07 >> AHI 29 CPAP 08/08/18 to 09/06/18 >> used on 30 of 30 nights with average 4 hrs 54 min.  Average AHI 7.1 with CPAP 10 cm H2O.  Cardiac tests:  Echo 08/27/11 >> EF 55 to 65%, mild MR, PAS 32 mmHg   Medications:   Allergies as of 09/07/2018      Reactions   Dicloxacillin Rash, Other (See Comments)   Has patient had a PCN reaction causing immediate rash, facial/tongue/throat swelling, SOB or lightheadedness with hypotension: No Has patient had a PCN reaction causing severe rash involving mucus membranes or skin necrosis: No Has patient had a PCN reaction that required hospitalization: No Has patient had a PCN reaction occurring within the last 10 years: No If all of the above answers are "NO", then may proceed with Cephalosporin use.   Cayenne Pepper [cayenne] Swelling   Nsaids Other (See Comments)   PT declines Acute renal failure 08/09/2015      Medication List        Accurate as of 09/07/18 10:18 AM. Always use your most recent med list.          allopurinol 300 MG tablet Commonly known as:  ZYLOPRIM TAKE 1 TABLET BY  MOUTH DAILY TO PREVENT GOUT   amLODipine-benazepril 10-20 MG capsule Commonly known as:  LOTREL TAKE 1 CAPSULE BY MOUTH EVERY DAY   aspirin 81 MG tablet Take 81 mg by mouth daily.   atorvastatin 10 MG tablet Commonly known as:  LIPITOR TAKE 1 TABLET BY MOUTH DAILY.   esomeprazole 40 MG capsule Commonly known as:  NEXIUM TAKE 1 CAPSULE BY MOUTH EVERY DAY   ferrous sulfate 325 (65 FE) MG EC tablet Take 325 mg by mouth daily with breakfast.   FOLIVANE-PLUS Caps TAKE 1 CAPSULE BY MOUTH EVERY DAY IN THE MORNING   nitroGLYCERIN 0.4 MG SL tablet Commonly known as:  NITROSTAT Place 0.4 mg under the tongue every 5 (five) minutes as needed for chest pain. Reported on 03/04/2016   TRADJENTA 5 MG Tabs tablet Generic drug:   linagliptin TAKE 1 TABLET BY MOUTH EVERY DAY   triamterene-hydrochlorothiazide 37.5-25 MG tablet Commonly known as:  MAXZIDE-25 TAKE 1 TABLET BY MOUTH EVERY DAY       Past Surgical History:  He  has a past surgical history that includes Knee surgery (1999); Hemorrhoid surgery; Robot assisted laparoscopic radical prostatectomy (N/A, 05/01/2014); Lymphadenectomy (Bilateral, 05/01/2014); Esophagogastroduodenoscopy; GI prostate biopsy; Incisional hernia repair (N/A, 08/07/2015); and Esophagogastroduodenoscopy (egd) with propofol (N/A, 11/04/2017).  Family History:  His family history includes Diabetes in his brother and mother; Heart attack in his father.  Social History:  He  reports that he has never smoked. He has never used smokeless tobacco. He reports that he does not drink alcohol or use drugs.

## 2018-09-14 ENCOUNTER — Inpatient Hospital Stay: Payer: Medicare Other | Attending: Internal Medicine

## 2018-09-14 DIAGNOSIS — D509 Iron deficiency anemia, unspecified: Secondary | ICD-10-CM | POA: Diagnosis not present

## 2018-09-14 DIAGNOSIS — D472 Monoclonal gammopathy: Secondary | ICD-10-CM | POA: Insufficient documentation

## 2018-09-14 DIAGNOSIS — R1032 Left lower quadrant pain: Secondary | ICD-10-CM

## 2018-09-14 DIAGNOSIS — C61 Malignant neoplasm of prostate: Secondary | ICD-10-CM | POA: Insufficient documentation

## 2018-09-14 LAB — COMPREHENSIVE METABOLIC PANEL
ALBUMIN: 3.6 g/dL (ref 3.5–5.0)
ALT: 18 U/L (ref 0–44)
ANION GAP: 10 (ref 5–15)
AST: 15 U/L (ref 15–41)
Alkaline Phosphatase: 79 U/L (ref 38–126)
BUN: 21 mg/dL (ref 8–23)
CALCIUM: 9.8 mg/dL (ref 8.9–10.3)
CHLORIDE: 106 mmol/L (ref 98–111)
CO2: 24 mmol/L (ref 22–32)
Creatinine, Ser: 1.2 mg/dL (ref 0.61–1.24)
GFR calc non Af Amer: 59 mL/min — ABNORMAL LOW (ref >60)
Glucose, Bld: 132 mg/dL — ABNORMAL HIGH (ref 70–99)
POTASSIUM: 3.8 mmol/L (ref 3.5–5.1)
SODIUM: 140 mmol/L (ref 135–145)
Total Bilirubin: 0.6 mg/dL (ref 0.3–1.2)
Total Protein: 7.7 g/dL (ref 6.5–8.1)

## 2018-09-14 LAB — CBC WITH DIFFERENTIAL/PLATELET
Abs Immature Granulocytes: 0.03 10*3/uL (ref 0.00–0.07)
BASOS ABS: 0 10*3/uL (ref 0.0–0.1)
BASOS PCT: 0 %
Eosinophils Absolute: 0.2 10*3/uL (ref 0.0–0.5)
Eosinophils Relative: 2 %
HCT: 36 % — ABNORMAL LOW (ref 39.0–52.0)
Hemoglobin: 11.9 g/dL — ABNORMAL LOW (ref 13.0–17.0)
Immature Granulocytes: 0 %
Lymphocytes Relative: 34 %
Lymphs Abs: 4.4 10*3/uL — ABNORMAL HIGH (ref 0.7–4.0)
MCH: 31.1 pg (ref 26.0–34.0)
MCHC: 33.1 g/dL (ref 30.0–36.0)
MCV: 94 fL (ref 80.0–100.0)
MONO ABS: 0.8 10*3/uL (ref 0.1–1.0)
Monocytes Relative: 6 %
NEUTROS ABS: 7.6 10*3/uL (ref 1.7–7.7)
NRBC: 0 % (ref 0.0–0.2)
Neutrophils Relative %: 58 %
PLATELETS: 287 10*3/uL (ref 150–400)
RBC: 3.83 MIL/uL — AB (ref 4.22–5.81)
RDW: 14.8 % (ref 11.5–15.5)
WBC: 13 10*3/uL — AB (ref 4.0–10.5)

## 2018-09-14 LAB — LACTATE DEHYDROGENASE: LDH: 148 U/L (ref 98–192)

## 2018-09-15 LAB — IGG, IGA, IGM
IGG (IMMUNOGLOBIN G), SERUM: 1146 mg/dL (ref 700–1600)
IgA: 235 mg/dL (ref 61–437)
IgM (Immunoglobulin M), Srm: 142 mg/dL (ref 15–143)

## 2018-09-15 LAB — BETA 2 MICROGLOBULIN, SERUM: Beta-2 Microglobulin: 2.5 mg/L — ABNORMAL HIGH (ref 0.6–2.4)

## 2018-09-15 LAB — KAPPA/LAMBDA LIGHT CHAINS
KAPPA, LAMDA LIGHT CHAIN RATIO: 1.74 — AB (ref 0.26–1.65)
Kappa free light chain: 47.4 mg/L — ABNORMAL HIGH (ref 3.3–19.4)
LAMDA FREE LIGHT CHAINS: 27.2 mg/L — AB (ref 5.7–26.3)

## 2018-09-21 ENCOUNTER — Encounter: Payer: Self-pay | Admitting: Internal Medicine

## 2018-09-21 ENCOUNTER — Telehealth: Payer: Self-pay

## 2018-09-21 ENCOUNTER — Inpatient Hospital Stay: Payer: Medicare Other | Attending: Internal Medicine | Admitting: Internal Medicine

## 2018-09-21 VITALS — BP 141/89 | HR 61 | Temp 98.1°F | Resp 20 | Ht 69.0 in | Wt 297.1 lb

## 2018-09-21 DIAGNOSIS — Z8546 Personal history of malignant neoplasm of prostate: Secondary | ICD-10-CM | POA: Diagnosis not present

## 2018-09-21 DIAGNOSIS — D508 Other iron deficiency anemias: Secondary | ICD-10-CM

## 2018-09-21 DIAGNOSIS — Z9079 Acquired absence of other genital organ(s): Secondary | ICD-10-CM | POA: Diagnosis not present

## 2018-09-21 DIAGNOSIS — D472 Monoclonal gammopathy: Secondary | ICD-10-CM

## 2018-09-21 DIAGNOSIS — K922 Gastrointestinal hemorrhage, unspecified: Secondary | ICD-10-CM | POA: Insufficient documentation

## 2018-09-21 DIAGNOSIS — D509 Iron deficiency anemia, unspecified: Secondary | ICD-10-CM

## 2018-09-21 NOTE — Progress Notes (Signed)
[facility] [phone]   [phone]  OFFICE PROGRESS NOTE  [name], MD [address] [place] [address]  DIAGNOSIS:  1) monoclonal gammopathy of undetermined significance.  2) iron deficiency anemia.  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 Tresa Moore.   PRIOR THERAPY: Feraheme infusion x 2 doses, last dose was given in October of 2014.  CURRENT THERAPY: Over-the-counter ferrous sulfate.  INTERVAL HISTORY: Leonard [name]. 72 y.o. male   Returns to the clinic today for annual follow-up visit.  The patient is feeling fine today with no concerning complaints except for arthritis.  He denied having any chest pain, shortness of breath, cough or hemoptysis.  He denied having any fever or chills.  He has no nausea, vomiting, diarrhea or constipation.  He is tolerating the oral iron tablets fairly well.  The patient is here today for evaluation and repeat myeloma panel.   MEDICAL HISTORY: Past Medical History:  Diagnosis Date  . 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 Coronary artery disease   . Diverticulosis of colon (without mention of hemorrhage) 09/05/2006   Colonoscopy   . Gastric polyp 2014   done at [facility]  . GERD (gastroesophageal reflux disease) 07/26/2001   EGD(Chronic)  . Gouty arthropathy   . Hemorrhoids   . Hiatal hernia 09/05/2006   EGD  . Hx of colonic polyps 09/05/2006   Colonoscopy(ADENOMATOUS POLYP)  . Iron deficiency anemia, unspecified   . Monoclonal gammopathy   . OSA (obstructive sleep apnea)    USES CPAP MACHINE   . Other and unspecified hyperlipidemia   . Prostate cancer ([place]) 2015  . Type II or unspecified type diabetes mellitus without mention of complication, not stated as uncontrolled   . Unspecified  essential hypertension     ALLERGIES:  is allergic to dicloxacillin; cayenne pepper [cayenne]; and nsaids.  MEDICATIONS:  Current Outpatient Medications  Medication Sig Dispense Refill  . allopurinol (ZYLOPRIM) 300 MG tablet TAKE 1 TABLET BY MOUTH DAILY TO PREVENT GOUT 90 tablet 3  . amLODipine-benazepril (LOTREL) 10-20 MG capsule TAKE 1 CAPSULE BY MOUTH EVERY DAY 90 capsule 3  . aspirin 81 MG tablet Take 81 mg by mouth daily.    Marland Kitchen atorvastatin (LIPITOR) 10 MG tablet TAKE 1 TABLET BY MOUTH DAILY. 30 tablet 11  . esomeprazole (NEXIUM) 40 MG capsule TAKE 1 CAPSULE BY MOUTH EVERY DAY 30 capsule 11  . FeFum-FePoly-FA-B Cmp-C-Biot (FOLIVANE-PLUS) CAPS TAKE 1 CAPSULE BY MOUTH EVERY DAY IN THE MORNING 90 capsule 1  . ferrous sulfate 325 (65 FE) MG EC tablet Take 325 mg by mouth daily with breakfast.    . nitroGLYCERIN (NITROSTAT) 0.4 MG SL tablet Place 0.4 mg under the tongue every 5 (five) minutes as needed for chest pain. Reported on 03/04/2016    . TRADJENTA 5 MG TABS tablet TAKE 1 TABLET BY MOUTH EVERY DAY 90 tablet 3  . triamterene-hydrochlorothiazide (MAXZIDE-25) 37.5-25 MG tablet TAKE 1 TABLET BY MOUTH EVERY DAY 30 tablet 11   No current facility-administered medications for this visit.     SURGICAL HISTORY:  Past Surgical History:  Procedure Laterality Date  . ESOPHAGOGASTRODUODENOSCOPY     Done at [facility]  . ESOPHAGOGASTRODUODENOSCOPY (EGD) WITH PROPOFOL N/A 11/04/2017   Procedure: ESOPHAGOGASTRODUODENOSCOPY (EGD) WITH PROPOFOL;  Surgeon: Jerene Bears, MD;  Location: [facility];  Service: Gastroenterology;  Laterality: N/A;  . GI PROSTATE BIOPSY    . HEMORRHOID SURGERY    . INCISIONAL HERNIA REPAIR N/A 08/07/2015   Procedure: LAPAROSCOPIC INCISIONAL HERNIA repair with mesh;  Surgeon: Arta Bruce Kinsinger, MD;  Location: [facility];  Service: General;  Laterality: N/A;  . KNEE SURGERY  1999   TUMOR REMOVED LEFT KNEE & ARTHROSCOPIC SURGERY  . LYMPHADENECTOMY Bilateral 05/01/2014    Procedure: LYMPHADENECTOMY;  Surgeon: Alexis Frock, MD;  Location: [facility];  Service: Urology;  Laterality: Bilateral;  . ROBOT ASSISTED LAPAROSCOPIC RADICAL PROSTATECTOMY N/A 05/01/2014   Procedure: ROBOTIC ASSISTED LAPAROSCOPIC RADICAL PROSTATECTOMY WITH INDOCYANINE GREEN DYE;  Surgeon: Alexis Frock, MD;  Location: [facility];  Service: Urology;  Laterality: N/A;    REVIEW OF SYSTEMS:  A comprehensive review of systems was negative except for: Musculoskeletal: positive for arthralgias   PHYSICAL EXAMINATION: General appearance: alert, cooperative and no distress Head: Normocephalic, without obvious abnormality, atraumatic Neck: no adenopathy and thyroid not enlarged, symmetric, no tenderness/mass/nodules Lymph nodes: Cervical, supraclavicular, and axillary nodes normal. Resp: clear to auscultation bilaterally Back: symmetric, no curvature. ROM normal. No CVA tenderness. Cardio: irregularly irregular rhythm GI: soft, non-tender; bowel sounds normal; no masses,  no organomegaly Extremities: extremities normal, atraumatic, no cyanosis or edema  ECOG PERFORMANCE STATUS: 1 - Symptomatic but completely ambulatory  Blood pressure (!) 141/89, pulse 61, temperature 98.1 F (36.7 C), temperature source Oral, resp. rate 20, height 5\' 9"  (1.753 m), weight 297 lb 1.6 oz (134.8 kg), SpO2 99 %.  LABORATORY DATA: Lab Results  Component Value Date   WBC 13.0 (H) 09/14/2018   HGB 11.9 (L) 09/14/2018   HCT 36.0 (L) 09/14/2018   MCV 94.0 09/14/2018   PLT 287 09/14/2018      Chemistry      Component Value Date/Time   NA 140 09/14/2018 0810   NA 140 09/14/2017 0928   K 3.8 09/14/2018 0810   K 4.0 09/14/2017 0928   CL 106 09/14/2018 0810   CL 104 04/17/2013 0857   CO2 24 09/14/2018 0810   CO2 27 09/14/2017 0928   BUN 21 09/14/2018 0810   BUN 28.3 (H) 09/14/2017 0928   CREATININE 1.20 09/14/2018 0810   CREATININE 1.25 (H) 08/14/2018 1125   CREATININE 1.4 (H) 09/14/2017 0928      Component  Value Date/Time   CALCIUM 9.8 09/14/2018 0810   CALCIUM 10.1 09/14/2017 0928   ALKPHOS 79 09/14/2018 0810   ALKPHOS 80 09/14/2017 0928   AST 15 09/14/2018 0810   AST 17 09/14/2017 0928   ALT 18 09/14/2018 0810   ALT 18 09/14/2017 0928   BILITOT 0.6 09/14/2018 0810   BILITOT 0.45 09/14/2017 0928     RADIOGRAPHIC STUDIES: No results found.  ASSESSMENT AND PLAN:  This is a very pleasant 72 years old African-American male with iron deficiency anemia secondary to history of gastrointestinal blood loss.  The patient is currently on oral iron tablets and he is tolerating it fairly well. CBC today showed persistent but mild anemia. Myeloma panel showed no concerning findings for disease progression. I discussed the lab results with the patient and recommended for him to continue on observation with repeat myeloma panel and iron study in 1 year. He was advised to call immediately if he has any concerning symptoms in the interval. The patient voices understanding of current disease status and treatment options and is in agreement with the current care plan. All questions were answered. The patient  knows to call the clinic with any problems, questions or concerns. We can certainly see the patient much sooner if necessary. I spent 10 minutes counseling the patient face to face. The total time spent in the appointment was 15 minutes.  Disclaimer: This note was dictated with voice recognition software. Similar sounding words can inadvertently be transcribed and may not be corrected upon review.

## 2018-09-21 NOTE — Telephone Encounter (Signed)
Printed avs and calender of upcoming appointment. Per 11/7 los 

## 2018-10-06 ENCOUNTER — Telehealth: Payer: Self-pay | Admitting: Pulmonary Disease

## 2018-10-06 DIAGNOSIS — G4733 Obstructive sleep apnea (adult) (pediatric): Secondary | ICD-10-CM

## 2018-10-06 DIAGNOSIS — Z9989 Dependence on other enabling machines and devices: Principal | ICD-10-CM

## 2018-10-06 NOTE — Telephone Encounter (Signed)
Returned call to Patient.  He his needing a new order sent to [facility] for his CPAP supplies.  Patient was last seen 09/07/18, by Dr. Halford Chessman.  Dr. Halford Chessman, please advise

## 2018-10-09 NOTE — Telephone Encounter (Signed)
Okay to send order to have new CPAP mask and supplies.

## 2018-10-10 ENCOUNTER — Other Ambulatory Visit: Payer: Self-pay | Admitting: Internal Medicine

## 2018-10-10 NOTE — Telephone Encounter (Signed)
Attempted to contact pt. I did not receive an answer. I have left a message for pt to return our call.  

## 2018-10-11 NOTE — Telephone Encounter (Signed)
Called and spoke with patient, he is aware of response and verbalized understanding. Order has been sent. Nothing further needed.

## 2018-11-17 ENCOUNTER — Other Ambulatory Visit: Payer: Self-pay | Admitting: Internal Medicine

## 2018-11-22 ENCOUNTER — Telehealth: Payer: Self-pay | Admitting: Internal Medicine

## 2018-11-22 NOTE — Telephone Encounter (Signed)
States that his insurance is not going to cover Tradjenta this year.  So, he would like to know what else you can put him on that would be similar.  He only has 1 day of medication left.    Pharmacy:  [facility] in [place].  Thank you.

## 2018-11-23 ENCOUNTER — Encounter: Payer: Self-pay | Admitting: Internal Medicine

## 2018-11-23 LAB — HM DIABETES EYE EXAM

## 2018-11-23 MED ORDER — SITAGLIPTIN PHOSPHATE 50 MG PO TABS: 50.0000 mg | ORAL_TABLET | Freq: Every day | ORAL | 0 refills | Status: DC

## 2018-11-23 NOTE — Telephone Encounter (Signed)
Ask him to see if Leonard [name] is covered

## 2018-11-23 NOTE — Telephone Encounter (Signed)
Pt was notified of the instructions, pt verbalized understanding.

## 2018-11-23 NOTE — Telephone Encounter (Signed)
Left message letting him know to contact his insurance, then to call us back.

## 2018-11-23 NOTE — Telephone Encounter (Signed)
He said he called his insurance and they do cover Januvia.

## 2018-11-23 NOTE — Telephone Encounter (Signed)
So call in 90 tabs Januvia 50 mg daily and have him follow up here on this new med in 4 weeks with OV and Hgb AIC

## 2018-11-24 ENCOUNTER — Other Ambulatory Visit: Payer: Self-pay | Admitting: Internal Medicine

## 2018-11-24 MED ORDER — ATORVASTATIN CALCIUM 10 MG PO TABS: ORAL_TABLET | ORAL | 1 refills | Status: DC

## 2018-11-28 ENCOUNTER — Other Ambulatory Visit: Payer: Self-pay | Admitting: Internal Medicine

## 2018-11-28 DIAGNOSIS — D649 Anemia, unspecified: Secondary | ICD-10-CM

## 2018-12-16 ENCOUNTER — Other Ambulatory Visit: Payer: Self-pay | Admitting: Internal Medicine

## 2019-01-02 ENCOUNTER — Other Ambulatory Visit: Payer: Medicare Other | Admitting: Internal Medicine

## 2019-01-02 DIAGNOSIS — I10 Essential (primary) hypertension: Secondary | ICD-10-CM

## 2019-01-02 DIAGNOSIS — E7849 Other hyperlipidemia: Secondary | ICD-10-CM | POA: Diagnosis not present

## 2019-01-02 DIAGNOSIS — E119 Type 2 diabetes mellitus without complications: Secondary | ICD-10-CM | POA: Diagnosis not present

## 2019-01-03 LAB — HEPATIC FUNCTION PANEL
AG Ratio: 1.4 (calc) (ref 1.0–2.5)
ALT: 16 U/L (ref 9–46)
AST: 15 U/L (ref 10–35)
Albumin: 4 g/dL (ref 3.6–5.1)
Alkaline phosphatase (APISO): 68 U/L (ref 35–144)
BILIRUBIN TOTAL: 0.5 mg/dL (ref 0.2–1.2)
Bilirubin, Direct: 0.1 mg/dL (ref 0.0–0.2)
Globulin: 2.9 g/dL (calc) (ref 1.9–3.7)
Indirect Bilirubin: 0.4 mg/dL (calc) (ref 0.2–1.2)
Total Protein: 6.9 g/dL (ref 6.1–8.1)

## 2019-01-03 LAB — BASIC METABOLIC PANEL
BUN: 21 mg/dL (ref 7–25)
CHLORIDE: 102 mmol/L (ref 98–110)
CO2: 27 mmol/L (ref 20–32)
Calcium: 9.5 mg/dL (ref 8.6–10.3)
Creat: 1.15 mg/dL (ref 0.70–1.18)
Glucose, Bld: 117 mg/dL — ABNORMAL HIGH (ref 65–99)
Potassium: 3.9 mmol/L (ref 3.5–5.3)
Sodium: 138 mmol/L (ref 135–146)

## 2019-01-03 LAB — LIPID PANEL
Cholesterol: 149 mg/dL (ref <200)
HDL: 66 mg/dL (ref >=40)
LDL Cholesterol (Calc): 65 mg/dL (calc)
Non-HDL Cholesterol (Calc): 83 mg/dL (calc) (ref <130)
TRIGLYCERIDES: 101 mg/dL (ref <150)
Total CHOL/HDL Ratio: 2.3 (calc) (ref <5.0)

## 2019-01-03 LAB — HEMOGLOBIN A1C
Hgb A1c MFr Bld: 6.5 % of total Hgb — ABNORMAL HIGH (ref <5.7)
Mean Plasma Glucose: 140 (calc)
eAG (mmol/L): 7.7 (calc)

## 2019-01-03 LAB — MICROALBUMIN / CREATININE URINE RATIO
Creatinine, Urine: 142 mg/dL (ref 20–320)
MICROALB UR: 15.1 mg/dL
Microalb Creat Ratio: 106 mcg/mg creat — ABNORMAL HIGH (ref <30)

## 2019-01-05 ENCOUNTER — Encounter: Payer: Self-pay | Admitting: Internal Medicine

## 2019-01-05 ENCOUNTER — Ambulatory Visit (INDEPENDENT_AMBULATORY_CARE_PROVIDER_SITE_OTHER): Payer: Medicare Other | Admitting: Internal Medicine

## 2019-01-05 VITALS — BP 120/70 | HR 78 | Ht 69.0 in | Wt 288.0 lb

## 2019-01-05 DIAGNOSIS — K219 Gastro-esophageal reflux disease without esophagitis: Secondary | ICD-10-CM

## 2019-01-05 DIAGNOSIS — Z6841 Body Mass Index (BMI) 40.0 and over, adult: Secondary | ICD-10-CM

## 2019-01-05 DIAGNOSIS — D472 Monoclonal gammopathy: Secondary | ICD-10-CM | POA: Diagnosis not present

## 2019-01-05 DIAGNOSIS — E785 Hyperlipidemia, unspecified: Secondary | ICD-10-CM | POA: Diagnosis not present

## 2019-01-05 DIAGNOSIS — E1169 Type 2 diabetes mellitus with other specified complication: Secondary | ICD-10-CM | POA: Diagnosis not present

## 2019-01-05 DIAGNOSIS — I2510 Atherosclerotic heart disease of native coronary artery without angina pectoris: Secondary | ICD-10-CM | POA: Diagnosis not present

## 2019-01-05 DIAGNOSIS — I10 Essential (primary) hypertension: Secondary | ICD-10-CM | POA: Diagnosis not present

## 2019-01-05 NOTE — Progress Notes (Signed)
   Subjective:    Patient ID: Leonard Pitter., male    DOB: 17-Jun-1946, 73 y.o.   MRN: [id number]  HPI   73 year old Male for  6 month follow up.  This is a rough month for him as well as his wife is his stepdaughter died a year ago from complications of meningitis.  There have been some issues at church that are difficult.  Hemoglobin A1c is 6.5%.  Needs to diet exercise and lose weight.  BUN and creatinine are normal.  Lipid panel is normal.  Liver functions are normal.  He is on low-dose Lipitor.  Takes Nexium for GE reflux.  He is on Januvia for diabetes mellitus.  Takes Maxide, amlodipine and benazepril for hypertension.    Review of Systems see above     Objective:   Physical Exam Vital signs reviewed at 120/70.  BMI is 42.53.  Weight 288 pounds.  Skin warm and dry.  Nodes none.  Neck is supple.  No carotid bruits.  Chest clear to auscultation.  Cardiac exam regular rate and rhythm normal S1 and S2.  Extremities without pitting edema       Assessment & Plan:  BMI 42.53-needs to be serious about diet exercise and weight loss.  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  Essential hypertension-stable on current regimen  Hyperlipidemia-lipid panel stable  Impaired glucose tolerance/diabetes mellitus-hemoglobin A1c 6.5% and could be better with appropriate diet and exercise  GE reflux-longstanding treated with PPI  Grief reaction secondary to loss of his stepdaughter about a year ago and anniversary reaction  Plan: Encourage diet exercise and weight loss and follow-up in 6 months.

## 2019-01-08 DIAGNOSIS — E291 Testicular hypofunction: Secondary | ICD-10-CM | POA: Diagnosis not present

## 2019-01-08 DIAGNOSIS — N5201 Erectile dysfunction due to arterial insufficiency: Secondary | ICD-10-CM | POA: Diagnosis not present

## 2019-01-08 DIAGNOSIS — N393 Stress incontinence (female) (male): Secondary | ICD-10-CM | POA: Diagnosis not present

## 2019-01-08 DIAGNOSIS — C61 Malignant neoplasm of prostate: Secondary | ICD-10-CM | POA: Diagnosis not present

## 2019-01-13 NOTE — Patient Instructions (Signed)
Please work on diet exercise and weight loss.  Continue same medications and follow-up in 6 months.  It was a pleasure to see you today.

## 2019-02-03 ENCOUNTER — Other Ambulatory Visit: Payer: Self-pay | Admitting: Internal Medicine

## 2019-02-25 ENCOUNTER — Other Ambulatory Visit: Payer: Self-pay | Admitting: Internal Medicine

## 2019-03-14 ENCOUNTER — Other Ambulatory Visit: Payer: Self-pay | Admitting: Internal Medicine

## 2019-03-15 ENCOUNTER — Other Ambulatory Visit: Payer: Self-pay

## 2019-03-15 MED ORDER — ALLOPURINOL 300 MG PO TABS: ORAL_TABLET | ORAL | 3 refills | Status: DC

## 2019-05-24 ENCOUNTER — Other Ambulatory Visit: Payer: Self-pay | Admitting: Internal Medicine

## 2019-05-24 DIAGNOSIS — D649 Anemia, unspecified: Secondary | ICD-10-CM

## 2019-06-12 ENCOUNTER — Other Ambulatory Visit: Payer: Self-pay | Admitting: Internal Medicine

## 2019-07-09 ENCOUNTER — Other Ambulatory Visit: Payer: Self-pay

## 2019-07-09 ENCOUNTER — Other Ambulatory Visit: Payer: Medicare Other | Admitting: Internal Medicine

## 2019-07-09 DIAGNOSIS — Z0000 Encounter for general adult medical examination without abnormal findings: Secondary | ICD-10-CM | POA: Diagnosis not present

## 2019-07-09 DIAGNOSIS — I10 Essential (primary) hypertension: Secondary | ICD-10-CM | POA: Diagnosis not present

## 2019-07-09 DIAGNOSIS — I2510 Atherosclerotic heart disease of native coronary artery without angina pectoris: Secondary | ICD-10-CM | POA: Diagnosis not present

## 2019-07-09 DIAGNOSIS — E1169 Type 2 diabetes mellitus with other specified complication: Secondary | ICD-10-CM | POA: Diagnosis not present

## 2019-07-09 DIAGNOSIS — E785 Hyperlipidemia, unspecified: Secondary | ICD-10-CM | POA: Diagnosis not present

## 2019-07-09 DIAGNOSIS — Z125 Encounter for screening for malignant neoplasm of prostate: Secondary | ICD-10-CM | POA: Diagnosis not present

## 2019-07-09 DIAGNOSIS — K219 Gastro-esophageal reflux disease without esophagitis: Secondary | ICD-10-CM | POA: Diagnosis not present

## 2019-07-10 ENCOUNTER — Other Ambulatory Visit: Payer: Medicare Other | Admitting: Internal Medicine

## 2019-07-10 LAB — CBC WITH DIFFERENTIAL/PLATELET
Absolute Monocytes: 732 cells/uL (ref 200–950)
Basophils Absolute: 24 cells/uL (ref 0–200)
Basophils Relative: 0.2 %
Eosinophils Absolute: 153 cells/uL (ref 15–500)
Eosinophils Relative: 1.3 %
HCT: 35.4 % — ABNORMAL LOW (ref 38.5–50.0)
Hemoglobin: 11.7 g/dL — ABNORMAL LOW (ref 13.2–17.1)
Lymphs Abs: 3446 cells/uL (ref 850–3900)
MCH: 31 pg (ref 27.0–33.0)
MCHC: 33.1 g/dL (ref 32.0–36.0)
MCV: 93.7 fL (ref 80.0–100.0)
MPV: 9.9 fL (ref 7.5–12.5)
Monocytes Relative: 6.2 %
Neutro Abs: 7446 cells/uL (ref 1500–7800)
Neutrophils Relative %: 63.1 %
Platelets: 298 10*3/uL (ref 140–400)
RBC: 3.78 10*6/uL — ABNORMAL LOW (ref 4.20–5.80)
RDW: 14.6 % (ref 11.0–15.0)
Total Lymphocyte: 29.2 %
WBC: 11.8 10*3/uL — ABNORMAL HIGH (ref 3.8–10.8)

## 2019-07-10 LAB — COMPLETE METABOLIC PANEL WITH GFR
AG Ratio: 1.4 (calc) (ref 1.0–2.5)
ALT: 18 U/L (ref 9–46)
AST: 16 U/L (ref 10–35)
Albumin: 4 g/dL (ref 3.6–5.1)
Alkaline phosphatase (APISO): 65 U/L (ref 35–144)
BUN: 21 mg/dL (ref 7–25)
CO2: 28 mmol/L (ref 20–32)
Calcium: 9.6 mg/dL (ref 8.6–10.3)
Chloride: 104 mmol/L (ref 98–110)
Creat: 1.18 mg/dL (ref 0.70–1.18)
GFR, Est African American: 72 mL/min/{1.73_m2} (ref >=60)
GFR, Est Non African American: 62 mL/min/{1.73_m2} (ref >=60)
Globulin: 2.9 g/dL (calc) (ref 1.9–3.7)
Glucose, Bld: 130 mg/dL — ABNORMAL HIGH (ref 65–99)
Potassium: 4.1 mmol/L (ref 3.5–5.3)
Sodium: 141 mmol/L (ref 135–146)
Total Bilirubin: 0.5 mg/dL (ref 0.2–1.2)
Total Protein: 6.9 g/dL (ref 6.1–8.1)

## 2019-07-10 LAB — LIPID PANEL
Cholesterol: 137 mg/dL (ref <200)
HDL: 59 mg/dL (ref >=40)
LDL Cholesterol (Calc): 63 mg/dL (calc)
Non-HDL Cholesterol (Calc): 78 mg/dL (calc) (ref <130)
Total CHOL/HDL Ratio: 2.3 (calc) (ref <5.0)
Triglycerides: 69 mg/dL (ref <150)

## 2019-07-10 LAB — HEMOGLOBIN A1C
Hgb A1c MFr Bld: 6.6 % of total Hgb — ABNORMAL HIGH (ref <5.7)
Mean Plasma Glucose: 143 (calc)
eAG (mmol/L): 7.9 (calc)

## 2019-07-10 LAB — PSA: PSA: <0.1 ng/mL (ref <=4.0)

## 2019-07-13 ENCOUNTER — Telehealth: Payer: Self-pay | Admitting: Internal Medicine

## 2019-07-13 ENCOUNTER — Encounter: Payer: Self-pay | Admitting: Internal Medicine

## 2019-07-13 ENCOUNTER — Ambulatory Visit (INDEPENDENT_AMBULATORY_CARE_PROVIDER_SITE_OTHER): Payer: Medicare Other | Admitting: Internal Medicine

## 2019-07-13 ENCOUNTER — Other Ambulatory Visit: Payer: Self-pay

## 2019-07-13 VITALS — BP 120/70 | HR 74 | Temp 98.4°F | Ht 69.75 in | Wt 286.0 lb

## 2019-07-13 DIAGNOSIS — Z23 Encounter for immunization: Secondary | ICD-10-CM

## 2019-07-13 DIAGNOSIS — Z0000 Encounter for general adult medical examination without abnormal findings: Secondary | ICD-10-CM | POA: Diagnosis not present

## 2019-07-13 DIAGNOSIS — Z86010 Personal history of colonic polyps: Secondary | ICD-10-CM | POA: Diagnosis not present

## 2019-07-13 DIAGNOSIS — D472 Monoclonal gammopathy: Secondary | ICD-10-CM | POA: Diagnosis not present

## 2019-07-13 DIAGNOSIS — Z6841 Body Mass Index (BMI) 40.0 and over, adult: Secondary | ICD-10-CM

## 2019-07-13 DIAGNOSIS — I2510 Atherosclerotic heart disease of native coronary artery without angina pectoris: Secondary | ICD-10-CM | POA: Diagnosis not present

## 2019-07-13 DIAGNOSIS — E1169 Type 2 diabetes mellitus with other specified complication: Secondary | ICD-10-CM | POA: Diagnosis not present

## 2019-07-13 DIAGNOSIS — K219 Gastro-esophageal reflux disease without esophagitis: Secondary | ICD-10-CM

## 2019-07-13 DIAGNOSIS — D649 Anemia, unspecified: Secondary | ICD-10-CM | POA: Diagnosis not present

## 2019-07-13 DIAGNOSIS — I10 Essential (primary) hypertension: Secondary | ICD-10-CM | POA: Diagnosis not present

## 2019-07-13 DIAGNOSIS — Z8546 Personal history of malignant neoplasm of prostate: Secondary | ICD-10-CM

## 2019-07-13 DIAGNOSIS — G5603 Carpal tunnel syndrome, bilateral upper limbs: Secondary | ICD-10-CM | POA: Diagnosis not present

## 2019-07-13 DIAGNOSIS — E785 Hyperlipidemia, unspecified: Secondary | ICD-10-CM

## 2019-07-13 DIAGNOSIS — N183 Chronic kidney disease, stage 3 unspecified: Secondary | ICD-10-CM

## 2019-07-13 DIAGNOSIS — Z8639 Personal history of other endocrine, nutritional and metabolic disease: Secondary | ICD-10-CM | POA: Diagnosis not present

## 2019-07-13 DIAGNOSIS — Z860101 Personal history of adenomatous and serrated colon polyps: Secondary | ICD-10-CM

## 2019-07-13 LAB — POCT URINALYSIS DIPSTICK
Appearance: NEGATIVE
Bilirubin, UA: NEGATIVE
Blood, UA: NEGATIVE
Glucose, UA: NEGATIVE
Ketones, UA: NEGATIVE
Leukocytes, UA: NEGATIVE
Nitrite, UA: NEGATIVE
Odor: NEGATIVE
Protein, UA: POSITIVE — AB
Spec Grav, UA: 1.015 (ref 1.010–1.025)
Urobilinogen, UA: 0.2 E.U./dL
pH, UA: 6.5 (ref 5.0–8.0)

## 2019-07-13 NOTE — Telephone Encounter (Signed)
Faxed lab results to Dr Tresa Moore at [facility] urology

## 2019-07-13 NOTE — Progress Notes (Signed)
Subjective:    Patient ID: Leonard [name]., male    DOB: [birth year], 73 y.o.   MRN: [id number]  HPI 73 year old Male for health maintenance exam, Medicare wellness and evaluation of medical issues.  History of benign monoclonal gammopathy and iron deficiency treated and followed by Dr. Earlie Server.  He underwent surgery for prostate cancer June 2015 and is followed by Dr. Tresa Moore.  History of essential hypertension, diabetes mellitus, obesity, metabolic syndrome, hyperlipidemia.  History of obstructive sleep apnea.  Followed by cardiology for coronary artery disease.  Had minimal coronary artery disease on catheterization in 2007.  History of gout.  Longstanding history of GE reflux treated with PPI.  History of adenomatous colon polyp  History of tinea versicolor  History of bilateral carpal tunnel syndrome  History of peripheral neuropathy  History of low testosterone  History of obesity-BMI is 41.33.  Does not get much exercise health clinic.  Weight is 2 pounds heavier than last physical exam.  Social history: He is retired from the [employer].  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  He is married.  Does not smoke or consume alcohol.  Wife continues to work for the [employer] but is able to work from home.  Family history: Father died of an MI.  Mother died of an MI.  2 brothers with history of diabetes.  One sister.  Mother had diabetes.  Both parents had hypertension.    Review of Systems  Constitutional: Negative.   Respiratory: Negative.   Cardiovascular: Negative.   Gastrointestinal: Negative.   Genitourinary: Negative.   Neurological: Negative.   Psychiatric/Behavioral: Negative.        Objective:   Physical Exam Vitals signs reviewed.  Constitutional:      Appearance: He is obese.  HENT:     Head: Normocephalic.     Right Ear: Tympanic membrane normal.     Left Ear: Tympanic membrane normal.     Nose: Nose normal.   Mouth/Throat:     Mouth: Mucous membranes are moist.     Pharynx: Oropharynx is clear.  Eyes:     General: No scleral icterus.       Right eye: No discharge.        Left eye: No discharge.     Extraocular Movements: Extraocular movements intact.     Conjunctiva/sclera: Conjunctivae normal.     Pupils: Pupils are equal, round, and reactive to light.  Neck:     Musculoskeletal: Neck supple.  Cardiovascular:     Rate and Rhythm: Normal rate and regular rhythm.     Heart sounds: Normal heart sounds. No murmur.  Abdominal:     General: Bowel sounds are normal.     Palpations: Abdomen is soft. There is no mass.     Tenderness: There is no abdominal tenderness. There is no rebound.  Genitourinary:    Comments: Deferred to urologist status post prostatectomy Musculoskeletal:     Right lower leg: No edema.     Left lower leg: No edema.  Lymphadenopathy:     Cervical: No cervical adenopathy.  Skin:    General: Skin is warm and dry.  Neurological:     General: No focal deficit present.     Mental Status: He is alert and oriented to person, place, and time.  Psychiatric:        Mood and Affect: Mood normal.        Behavior: Behavior normal.  Thought Content: Thought content normal.        Judgment: Judgment normal.           Assessment & Plan:  BMI 41-encourage diet exercise and weight loss  Diabetes-stable with hemoglobin A1c 6.6%  Essential hypertension-stable on current regimen  Chronic kidney disease stage III-creatinine 1.18 and previously had been 1.25 last year  History of prostate cancer status post prostatectomy and followed by urology  History of iron deficiency likely from GI blood loss  Monoclonal gammopathy of unknown significance followed by Dr. Earlie Server  Normocytic anemia followed by Dr. Earlie Server.  Has normal MCV and hemoglobin 11.7 g.  Sometimes runs as high as 12.2 g.  Thought to be due to occult GI blood loss.  Hyperlipidemia-normal on statin  medication  History of kidney stones  Obstructive sleep apnea  Peripheral neuropathy  History of bilateral carpal tunnel syndrome  History of gout  History of tinea versicolor  Nonocclusive coronary artery disease followed by cardiology  GE reflux treated with PPI  History of adenomatous colon polyp  History of angioedema in the remote past but not recently  Microalbuminuria  Plan: We will continue with current regimen and follow-up in 6 months.  He will continue seeing regular specialist that he has relationships with including urologist and hematologist.  Subjective:   Patient presents for Medicare Annual/Subsequent preventive examination.  Review Past Medical/Family/Social: See above   Risk Factors  Current exercise habits: Not a lot of exercise- he does walk some Dietary issues discussed: Low-fat low carbohydrate  Cardiac risk factors: Hyperlipidemia, diabetes, hypertension, personal and family history  Depression Screen  (Note: if answer to either of the following is "Yes", a more complete depression screening is indicated)   Over the past two weeks, have you felt down, depressed or hopeless? No  Over the past two weeks, have you felt little interest or pleasure in doing things? No Have you lost interest or pleasure in daily life? No Do you often feel hopeless? No Do you cry easily over simple problems? No   Activities of Daily Living  In your present state of health, do you have any difficulty performing the following activities?:   Driving? No  Managing money? No  Feeding yourself? No  Getting from bed to chair? No  Climbing a flight of stairs? No  Preparing food and eating?: No  Bathing or showering? No  Getting dressed: No  Getting to the toilet? No  Using the toilet:No  Moving around from place to place: No  In the past year have you fallen or had a near fall?:No  Are you sexually active?  Yes Do you have more than one partner? No   Hearing  Difficulties: No  Do you often ask people to speak up or repeat themselves? No  Do you experience ringing or noises in your ears? No  Do you have difficulty understanding soft or whispered voices? No  Do you feel that you have a problem with memory? No Do you often misplace items? No    Home Safety:  Do you have a smoke alarm at your residence? Yes Do you have grab bars in the bathroom?  None Do you have throw rugs in your house?  Yes   Cognitive Testing  Alert? Yes Normal Appearance?Yes  Oriented to person? Yes Place? Yes  Time? Yes  Recall of three objects? Yes  Can perform simple calculations? Yes  Displays appropriate judgment?Yes  Can read the correct time from a watch face?Yes  List the Names of Other Physician/Practitioners you currently use:  See referral list for the physicians patient is currently seeing.     Review of Systems: No new complaints   Objective:     General appearance: Appears stated age and  obese  Head: Normocephalic, without obvious abnormality, atraumatic  Eyes: conj clear, EOMi PEERLA  Ears: normal TM's and external ear canals both ears  Nose: Nares normal. Septum midline. Mucosa normal. No drainage or sinus tenderness.  Throat: lips, mucosa, and tongue normal; teeth and gums normal  Neck: no adenopathy, no carotid bruit, no JVD, supple, symmetrical, trachea midline and thyroid not enlarged, symmetric, no tenderness/mass/nodules  No CVA tenderness.  Lungs: clear to auscultation bilaterally  Breasts: normal appearance, no masses or tenderness Heart: regular rate and rhythm, S1, S2 normal, no murmur, click, rub or gallop  Abdomen: soft, non-tender; bowel sounds normal; no masses, no organomegaly  Musculoskeletal: ROM normal in all joints, no crepitus, no deformity, Normal muscle strengthen. Back  is symmetric, no curvature. Skin: Skin color, texture, turgor normal. No rashes or lesions  Lymph nodes: Cervical, supraclavicular, and axillary  nodes normal.  Neurologic: CN 2 -12 Normal, Normal symmetric reflexes. Normal coordination and gait  Psych: Alert & Oriented x 3, Mood appear stable.    Assessment:    Annual wellness medicare exam   Plan:    During the course of the visit the patient was educated and counseled about appropriate screening and preventive services including:   Annual flu vaccine     Patient Instructions (the written plan) was given to the patient.  Medicare Attestation  I have personally reviewed:  The patient's medical and social history  Their use of alcohol, tobacco or illicit drugs  Their current medications and supplements  The patient's functional ability including ADLs,fall risks, home safety risks, cognitive, and hearing and visual impairment  Diet and physical activities  Evidence for depression or mood disorders  The patient's weight, height, BMI, and visual acuity have been recorded in the chart. I have made referrals, counseling, and provided education to the patient based on review of the above and I have provided the patient with a written personalized care plan for preventive services.

## 2019-07-15 NOTE — Progress Notes (Signed)
Cardiology Office Note   Date:  07/16/2019   ID:  Treveon Reincke., DOB [birth year], MRN [id number]  PCP:  [name], MD  Cardiologist:   Dorris Carnes, MD    F/U of CAD     History of Present Illness: Leonard [name]. is a 73 y.o. male with a history of HTN, OSA, minimal CAD at cath in  2007, sleep apnea, MGUS, prostate CA  and HL  I saw the pt back inFeb2019  Pt doing well  Breathing is OK   No CP   Using CPAP  Walks 2 to 3 x per week   Outpatient Medications Prior to Visit  Medication Sig Dispense Refill  . allopurinol (ZYLOPRIM) 300 MG tablet TAKE 1 TABLET BY MOUTH DAILY TO PREVENT GOUT 90 tablet 3  . amLODipine-benazepril (LOTREL) 10-20 MG capsule TAKE 1 CAPSULE BY MOUTH EVERY DAY 90 capsule 3  . aspirin 81 MG tablet Take 81 mg by mouth daily.    Marland Kitchen atorvastatin (LIPITOR) 10 MG tablet TAKE 1 TABLET BY MOUTH EVERY DAY 90 tablet 3  . esomeprazole (NEXIUM) 40 MG capsule TAKE 1 CAPSULE BY MOUTH EVERY DAY 30 capsule 11  . FeFum-FePoly-FA-B Cmp-C-Biot (FOLIVANE-PLUS) CAPS TAKE 1 CAPSULE BY MOUTH EVERY DAY IN THE MORNING 90 capsule 1  . ferrous sulfate 325 (65 FE) MG EC tablet Take 325 mg by mouth daily with breakfast.    . JANUVIA 50 MG tablet TAKE 1 TABLET BY MOUTH EVERY DAY 90 tablet 3  . nitroGLYCERIN (NITROSTAT) 0.4 MG SL tablet Place 0.4 mg under the tongue every 5 (five) minutes as needed for chest pain. Reported on 03/04/2016    . triamterene-hydrochlorothiazide (MAXZIDE-25) 37.5-25 MG tablet TAKE 1 TABLET BY MOUTH DAILY. PLEASE SCHEDULE AN APPT FOR FURTHER REFILLS. 1ST ATTEMPT 90 tablet 0   No facility-administered medications prior to visit.      Allergies:   Dicloxacillin, Cayenne pepper [cayenne], and Nsaids   Past Medical History:  Diagnosis Date  . 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 Coronary artery disease   . Diverticulosis of colon (without mention of hemorrhage) 09/05/2006   Colonoscopy   . Gastric polyp 2014   done  at [facility]  . GERD (gastroesophageal reflux disease) 07/26/2001   EGD(Chronic)  . Gouty arthropathy   . Hemorrhoids   . Hiatal hernia 09/05/2006   EGD  . Hx of colonic polyps 09/05/2006   Colonoscopy(ADENOMATOUS POLYP)  . Iron deficiency anemia, unspecified   . Monoclonal gammopathy   . OSA (obstructive sleep apnea)    USES CPAP MACHINE   . Other and unspecified hyperlipidemia   . Prostate cancer ([place]) 2015  . Type II or unspecified type diabetes mellitus without mention of complication, not stated as uncontrolled   . Unspecified essential hypertension     Past Surgical History:  Procedure Laterality Date  . ESOPHAGOGASTRODUODENOSCOPY     Done at [facility]  . ESOPHAGOGASTRODUODENOSCOPY (EGD) WITH PROPOFOL N/A 11/04/2017   Procedure: ESOPHAGOGASTRODUODENOSCOPY (EGD) WITH PROPOFOL;  Surgeon: Jerene Bears, MD;  [facility];  Service: Gastroenterology;  Laterality: N/A;  . GI PROSTATE BIOPSY    . HEMORRHOID SURGERY    . INCISIONAL HERNIA REPAIR N/A 08/07/2015   Procedure: LAPAROSCOPIC INCISIONAL HERNIA repair with mesh;  Surgeon: Arta Bruce Kinsinger, MD;  Location: [facility];  Service: General;  Laterality: N/A;  . KNEE SURGERY  1999   TUMOR REMOVED LEFT KNEE & ARTHROSCOPIC SURGERY  .  LYMPHADENECTOMY Bilateral 05/01/2014   Procedure: LYMPHADENECTOMY;  Surgeon: Alexis Frock, MD;  [facility];  Service: Urology;  Laterality: Bilateral;  . ROBOT ASSISTED LAPAROSCOPIC RADICAL PROSTATECTOMY N/A 05/01/2014   Procedure: ROBOTIC ASSISTED LAPAROSCOPIC RADICAL PROSTATECTOMY WITH INDOCYANINE GREEN DYE;  Surgeon: Alexis Frock, MD;  [facility];  Service: Urology;  Laterality: N/A;     Social History:  The patient  reports that he has never smoked. He has never used smokeless tobacco. He reports that he does not drink alcohol or use drugs.   Family History:  The patient's family history includes Diabetes in his brother and mother; Heart attack in his father.    ROS:  Please see  the history of present illness. All other systems are reviewed and  Negative to the above problem except as noted.    PHYSICAL EXAM: VS:  BP 122/60   Pulse 83   Ht 5' 9.75" (1.772 m)   Wt 291 lb 12.8 oz (132.4 kg)   SpO2 95%   BMI 42.17 kg/m   GEN Morbidly obese 73 yo , in no acute distress HEENT: normal Neck: JVP is normal  No carotid bruits, Cardiac: RRR; no murmurs, rubs, or gallops,no edema  Respiratory:  clear to auscultation bilaterally, normal work of breathing GI: soft, nontender, nondistended, + BS  No hepatomegaly  MS: no deformity Moving all extremities   Skin: warm and dry, no rash Neuro:  Strength and sensation are intact Psych: euthymic mood, full affect   EKG:  EKG is not ordered today.   Lipid Panel    Component Value Date/Time   CHOL 137 07/09/2019 0904   TRIG 69 07/09/2019 0904   HDL 59 07/09/2019 0904   CHOLHDL 2.3 07/09/2019 0904   VLDL 18 07/01/2017 1036   LDLCALC 63 07/09/2019 0904      Wt Readings from Last 3 Encounters:  07/16/19 291 lb 12.8 oz (132.4 kg)  07/13/19 286 lb (129.7 kg)  01/05/19 288 lb (130.6 kg)      ASSESSMENT AND PLAN:  1  CAD Remains symptom free with no angina    2 HTN   BP is controlled on current regien    3  LIpdis  Lipids are great  Continue current regeimn  4  Exercise  Congratulated on walking  Urged him to increase      F/u Fall 2021    Current medicines are reviewed at length with the patient today.  The patient does not have concerns regarding medicines.  Signed, Dorris Carnes, MD  07/16/2019 8:18 AM    [address] [place], [place], [place]  [address] Phone: [phone]; Fax: [phone]

## 2019-07-16 ENCOUNTER — Encounter: Payer: Self-pay | Admitting: Internal Medicine

## 2019-07-16 ENCOUNTER — Other Ambulatory Visit: Payer: Self-pay

## 2019-07-16 ENCOUNTER — Ambulatory Visit (INDEPENDENT_AMBULATORY_CARE_PROVIDER_SITE_OTHER): Payer: Medicare Other | Admitting: Internal Medicine

## 2019-07-16 VITALS — BP 122/60 | HR 83 | Ht 69.75 in | Wt 291.8 lb

## 2019-07-16 DIAGNOSIS — I2510 Atherosclerotic heart disease of native coronary artery without angina pectoris: Secondary | ICD-10-CM | POA: Diagnosis not present

## 2019-07-16 DIAGNOSIS — I10 Essential (primary) hypertension: Secondary | ICD-10-CM

## 2019-07-16 DIAGNOSIS — E782 Mixed hyperlipidemia: Secondary | ICD-10-CM | POA: Diagnosis not present

## 2019-07-16 NOTE — Patient Instructions (Signed)
Medication Instructions:  No changes If you need a refill on your cardiac medications before your next appointment, please call your pharmacy.   Lab work: none If you have labs (blood work) drawn today and your tests are completely normal, you will receive your results only by: Marland Kitchen MyChart Message (if you have MyChart) OR . A paper copy in the mail If you have any lab test that is abnormal or we need to change your treatment, we will call you to review the results.  Testing/Procedures: none  Follow-Up: At [facility], you and your health needs are our priority.  As part of our continuing mission to provide you with exceptional heart care, we have created designated Provider Care Teams.  These Care Teams include your primary Cardiologist (physician) and Advanced Practice Providers (APPs -  Physician Assistants and Nurse Practitioners) who all work together to provide you with the care you need, when you need it. You will need a follow up appointment in:  12 months.  Please call our office 2 months in advance to schedule this appointment.  You may see Dr. Dorris Carnes or one of the following Advanced Practice Providers on your designated Care Team: Richardson Dopp, PA-C [place], [place] . Daune Perch, NP  Any Other Special Instructions Will Be Listed Below (If Applicable).

## 2019-07-27 ENCOUNTER — Other Ambulatory Visit: Payer: Self-pay | Admitting: Internal Medicine

## 2019-08-05 NOTE — Patient Instructions (Signed)
It was a pleasure to see you today.  Please continue current medications.  It is imperative that you try to lose some weight and get more exercise.  Follow-up in 6 months.  Flu vaccine given

## 2019-08-27 ENCOUNTER — Telehealth: Payer: Self-pay | Admitting: Internal Medicine

## 2019-08-27 ENCOUNTER — Other Ambulatory Visit: Payer: Self-pay

## 2019-08-27 ENCOUNTER — Ambulatory Visit (INDEPENDENT_AMBULATORY_CARE_PROVIDER_SITE_OTHER): Payer: Medicare Other | Admitting: Internal Medicine

## 2019-08-27 ENCOUNTER — Encounter: Payer: Self-pay | Admitting: Internal Medicine

## 2019-08-27 VITALS — BP 140/70 | HR 83 | Temp 98.0°F | Ht 69.75 in | Wt 288.0 lb

## 2019-08-27 DIAGNOSIS — I2510 Atherosclerotic heart disease of native coronary artery without angina pectoris: Secondary | ICD-10-CM | POA: Diagnosis not present

## 2019-08-27 DIAGNOSIS — R300 Dysuria: Secondary | ICD-10-CM

## 2019-08-27 DIAGNOSIS — R109 Unspecified abdominal pain: Secondary | ICD-10-CM

## 2019-08-27 DIAGNOSIS — R8290 Unspecified abnormal findings in urine: Secondary | ICD-10-CM | POA: Diagnosis not present

## 2019-08-27 DIAGNOSIS — R1032 Left lower quadrant pain: Secondary | ICD-10-CM

## 2019-08-27 DIAGNOSIS — B9620 Unspecified Escherichia coli [E. coli] as the cause of diseases classified elsewhere: Secondary | ICD-10-CM | POA: Diagnosis not present

## 2019-08-27 DIAGNOSIS — N390 Urinary tract infection, site not specified: Secondary | ICD-10-CM

## 2019-08-27 DIAGNOSIS — R319 Hematuria, unspecified: Secondary | ICD-10-CM

## 2019-08-27 LAB — POCT URINALYSIS DIPSTICK
Bilirubin, UA: NEGATIVE
Glucose, UA: NEGATIVE
Ketones, UA: NEGATIVE
Nitrite, UA: NEGATIVE
Protein, UA: POSITIVE — AB
Spec Grav, UA: 1.01 (ref 1.010–1.025)
Urobilinogen, UA: 0.2 E.U./dL
pH, UA: 7.5 (ref 5.0–8.0)

## 2019-08-27 MED ORDER — CIPROFLOXACIN HCL 500 MG PO TABS: 500.0000 mg | ORAL_TABLET | Freq: Two times a day (BID) | ORAL | 0 refills | Status: DC

## 2019-08-27 MED ORDER — HYDROCODONE-ACETAMINOPHEN 10-325 MG PO TABS: 1.0000 | ORAL_TABLET | Freq: Four times a day (QID) | ORAL | 0 refills | Status: DC | PRN

## 2019-08-27 NOTE — Telephone Encounter (Signed)
Leonard [name] [phone]  Henery called to say that he is having lower back pain for the last week and half. Would like to come in and be seen.

## 2019-08-27 NOTE — Telephone Encounter (Signed)
OK 

## 2019-08-27 NOTE — Progress Notes (Signed)
   Subjective:    Patient ID: Leonard [name]., male    DOB: November 06, 1946, 73 y.o.   MRN: [id number]  HPI 73 year old Male presents with left sided back pain.  History of prostatectomy for prostate cancer with no recurrence.  Had kidney stone left kidney 2001  Has had malaise, fatigue and perhaps low-grade fever.  Perhaps some mild dysuria or urinary hesitancy.  Review of Systems had blood in urine last week 2 or 3 times. No nausea or vomiting.  History of diabetes mellitus with hemoglobin A1c in August 6 0.6%.     Objective:   Physical Exam Urine dipstick abnormal.  Large blood and small LE.  Sent for microscopic showing greater than 60 red blood cells per high-powered field and greater than 60 white blood cells per high-powered field.  Urine culture sent and subsequently grew E. coli.  BUN is elevated at 29 and previously was 21.  Creatinine elevated at 1.42 and previously was 1.18 in August 2020       Assessment & Plan:  Suspect obstructing stone with infection versus pyelonephritis  Plan: Patient will have CT as soon as possible to rule out stone with obstruction.  Culture sent.  Started on Cipro 500 mg twice daily for 10 days.  Will need urology consultation.  CT renal stone study shows extensive left-sided periureteral soft tissue stranding and mild left perinephric stranding with mild fullness in the left renal collecting system possibly due to recently passed calculus or a sending left-sided urinary tract infection.  No calculi identified.  [facility]btain appointment with [facility].  Urine culture grew E. coli sensitive to Cipro.

## 2019-08-27 NOTE — Telephone Encounter (Signed)
Scheduled appointment

## 2019-08-28 ENCOUNTER — Ambulatory Visit (HOSPITAL_COMMUNITY)
Admission: RE | Admit: 2019-08-28 | Discharge: 2019-08-28 | Disposition: A | Discharge status: Home / Self Care | Payer: Medicare Other | Source: Ambulatory Visit | Attending: Internal Medicine | Admitting: Internal Medicine

## 2019-08-28 ENCOUNTER — Other Ambulatory Visit: Payer: Medicare Other | Admitting: Internal Medicine

## 2019-08-28 DIAGNOSIS — R109 Unspecified abdominal pain: Secondary | ICD-10-CM | POA: Diagnosis present

## 2019-08-28 DIAGNOSIS — R8290 Unspecified abnormal findings in urine: Secondary | ICD-10-CM

## 2019-08-28 DIAGNOSIS — R319 Hematuria, unspecified: Secondary | ICD-10-CM | POA: Diagnosis not present

## 2019-08-28 DIAGNOSIS — K5730 Diverticulosis of large intestine without perforation or abscess without bleeding: Secondary | ICD-10-CM | POA: Diagnosis not present

## 2019-08-28 DIAGNOSIS — D3502 Benign neoplasm of left adrenal gland: Secondary | ICD-10-CM | POA: Diagnosis not present

## 2019-08-28 DIAGNOSIS — R1032 Left lower quadrant pain: Secondary | ICD-10-CM | POA: Insufficient documentation

## 2019-08-28 LAB — BASIC METABOLIC PANEL
BUN/Creatinine Ratio: 20 (calc) (ref 6–22)
BUN: 29 mg/dL — ABNORMAL HIGH (ref 7–25)
CO2: 24 mmol/L (ref 20–32)
Calcium: 9.7 mg/dL (ref 8.6–10.3)
Chloride: 100 mmol/L (ref 98–110)
Creat: 1.42 mg/dL — ABNORMAL HIGH (ref 0.70–1.18)
Glucose, Bld: 141 mg/dL — ABNORMAL HIGH (ref 65–99)
Potassium: 4 mmol/L (ref 3.5–5.3)
Sodium: 136 mmol/L (ref 135–146)

## 2019-08-29 ENCOUNTER — Telehealth: Payer: Self-pay | Admitting: Internal Medicine

## 2019-08-29 LAB — URINE CULTURE
MICRO NUMBER:: 979178
SPECIMEN QUALITY:: ADEQUATE

## 2019-08-29 LAB — URINALYSIS, MICROSCOPIC ONLY
Bacteria, UA: NONE SEEN /HPF
Hyaline Cast: NONE SEEN /LPF
RBC / HPF: >=60 /HPF — AB (ref 0–2)
Squamous Epithelial / HPF: NONE SEEN /HPF (ref <=5)
WBC, UA: >=60 /HPF — AB (ref 0–5)

## 2019-08-29 MED ORDER — HYDROCODONE-ACETAMINOPHEN 10-325 MG PO TABS: 1.0000 | ORAL_TABLET | Freq: Four times a day (QID) | ORAL | 0 refills | Status: AC | PRN

## 2019-08-29 MED ORDER — HYDROCODONE-ACETAMINOPHEN 10-325 MG PO TABS: 1.0000 | ORAL_TABLET | Freq: Four times a day (QID) | ORAL | 0 refills | Status: DC | PRN

## 2019-08-29 NOTE — Telephone Encounter (Signed)
Called and spoke with Jenny Reichmann at [facility] - faxed Renal CT results to her [phone] - she is going to call and schedule patient to come into be seen. She will call back or fax appointment date and time.

## 2019-08-29 NOTE — Telephone Encounter (Signed)
Jenny Reichmann called back, she scheduled patient to see PA 08/30/19 at 9:45

## 2019-08-29 NOTE — Telephone Encounter (Signed)
Also faxed lab results to [facility] at [facility]

## 2019-08-30 DIAGNOSIS — N10 Acute tubulo-interstitial nephritis: Secondary | ICD-10-CM | POA: Diagnosis not present

## 2019-09-12 NOTE — Patient Instructions (Addendum)
CT arranged to rule out obstruction.  Start Cipro 500 mg twice daily for 10 days.  Urine culture obtained.  Will likely need to see urologist.

## 2019-09-25 ENCOUNTER — Inpatient Hospital Stay: Payer: Medicare Other | Attending: Internal Medicine

## 2019-09-25 ENCOUNTER — Other Ambulatory Visit: Payer: Self-pay

## 2019-09-25 DIAGNOSIS — Z79899 Other long term (current) drug therapy: Secondary | ICD-10-CM | POA: Diagnosis not present

## 2019-09-25 DIAGNOSIS — D500 Iron deficiency anemia secondary to blood loss (chronic): Secondary | ICD-10-CM | POA: Diagnosis not present

## 2019-09-25 DIAGNOSIS — Z8546 Personal history of malignant neoplasm of prostate: Secondary | ICD-10-CM | POA: Diagnosis not present

## 2019-09-25 DIAGNOSIS — D509 Iron deficiency anemia, unspecified: Secondary | ICD-10-CM

## 2019-09-25 DIAGNOSIS — D472 Monoclonal gammopathy: Secondary | ICD-10-CM | POA: Diagnosis not present

## 2019-09-25 DIAGNOSIS — K922 Gastrointestinal hemorrhage, unspecified: Secondary | ICD-10-CM | POA: Insufficient documentation

## 2019-09-25 DIAGNOSIS — Z9079 Acquired absence of other genital organ(s): Secondary | ICD-10-CM | POA: Diagnosis not present

## 2019-09-25 LAB — CMP (CANCER CENTER ONLY)
ALT: 19 U/L (ref 0–44)
AST: 15 U/L (ref 15–41)
Albumin: 3.6 g/dL (ref 3.5–5.0)
Alkaline Phosphatase: 72 U/L (ref 38–126)
Anion gap: 13 (ref 5–15)
BUN: 19 mg/dL (ref 8–23)
CO2: 24 mmol/L (ref 22–32)
Calcium: 9.5 mg/dL (ref 8.9–10.3)
Chloride: 104 mmol/L (ref 98–111)
Creatinine: 1.35 mg/dL — ABNORMAL HIGH (ref 0.61–1.24)
GFR, Est AFR Am: >60 mL/min (ref >60)
GFR, Estimated: 52 mL/min — ABNORMAL LOW (ref >60)
Glucose, Bld: 143 mg/dL — ABNORMAL HIGH (ref 70–99)
Potassium: 4 mmol/L (ref 3.5–5.1)
Sodium: 141 mmol/L (ref 135–145)
Total Bilirubin: 0.4 mg/dL (ref 0.3–1.2)
Total Protein: 7.5 g/dL (ref 6.5–8.1)

## 2019-09-25 LAB — CBC WITH DIFFERENTIAL (CANCER CENTER ONLY)
Abs Immature Granulocytes: 0.03 10*3/uL (ref 0.00–0.07)
Basophils Absolute: 0 10*3/uL (ref 0.0–0.1)
Basophils Relative: 0 %
Eosinophils Absolute: 0.2 10*3/uL (ref 0.0–0.5)
Eosinophils Relative: 2 %
HCT: 35.5 % — ABNORMAL LOW (ref 39.0–52.0)
Hemoglobin: 11.5 g/dL — ABNORMAL LOW (ref 13.0–17.0)
Immature Granulocytes: 0 %
Lymphocytes Relative: 30 %
Lymphs Abs: 3.4 10*3/uL (ref 0.7–4.0)
MCH: 31.1 pg (ref 26.0–34.0)
MCHC: 32.4 g/dL (ref 30.0–36.0)
MCV: 95.9 fL (ref 80.0–100.0)
Monocytes Absolute: 0.8 10*3/uL (ref 0.1–1.0)
Monocytes Relative: 7 %
Neutro Abs: 6.8 10*3/uL (ref 1.7–7.7)
Neutrophils Relative %: 61 %
Platelet Count: 268 10*3/uL (ref 150–400)
RBC: 3.7 MIL/uL — ABNORMAL LOW (ref 4.22–5.81)
RDW: 14.5 % (ref 11.5–15.5)
WBC Count: 11.1 10*3/uL — ABNORMAL HIGH (ref 4.0–10.5)
nRBC: 0 % (ref 0.0–0.2)

## 2019-09-25 LAB — IRON AND TIBC
Iron: 55 ug/dL (ref 42–163)
Saturation Ratios: 20 % (ref 20–55)
TIBC: 276 ug/dL (ref 202–409)
UIBC: 221 ug/dL (ref 117–376)

## 2019-09-25 LAB — LACTATE DEHYDROGENASE: LDH: 130 U/L (ref 98–192)

## 2019-09-25 LAB — FERRITIN: Ferritin: 180 ng/mL (ref 24–336)

## 2019-09-26 LAB — KAPPA/LAMBDA LIGHT CHAINS
Kappa free light chain: 57.9 mg/L — ABNORMAL HIGH (ref 3.3–19.4)
Kappa, lambda light chain ratio: 2 — ABNORMAL HIGH (ref 0.26–1.65)
Lambda free light chains: 28.9 mg/L — ABNORMAL HIGH (ref 5.7–26.3)

## 2019-09-26 LAB — IGG, IGA, IGM
IgA: 223 mg/dL (ref 61–437)
IgG (Immunoglobin G), Serum: 1063 mg/dL (ref 603–1613)
IgM (Immunoglobulin M), Srm: 169 mg/dL — ABNORMAL HIGH (ref 15–143)

## 2019-09-26 LAB — BETA 2 MICROGLOBULIN, SERUM: Beta-2 Microglobulin: 2.4 mg/L (ref 0.6–2.4)

## 2019-10-02 ENCOUNTER — Encounter: Payer: Self-pay | Admitting: Internal Medicine

## 2019-10-02 ENCOUNTER — Other Ambulatory Visit: Payer: Self-pay

## 2019-10-02 ENCOUNTER — Inpatient Hospital Stay (HOSPITAL_BASED_OUTPATIENT_CLINIC_OR_DEPARTMENT_OTHER): Payer: Medicare Other | Admitting: Internal Medicine

## 2019-10-02 VITALS — BP 134/81 | HR 80 | Temp 98.2°F | Resp 16 | Ht 69.75 in | Wt 285.7 lb

## 2019-10-02 DIAGNOSIS — K922 Gastrointestinal hemorrhage, unspecified: Secondary | ICD-10-CM | POA: Diagnosis not present

## 2019-10-02 DIAGNOSIS — I2510 Atherosclerotic heart disease of native coronary artery without angina pectoris: Secondary | ICD-10-CM | POA: Diagnosis not present

## 2019-10-02 DIAGNOSIS — D472 Monoclonal gammopathy: Secondary | ICD-10-CM

## 2019-10-02 DIAGNOSIS — Z8546 Personal history of malignant neoplasm of prostate: Secondary | ICD-10-CM | POA: Diagnosis not present

## 2019-10-02 DIAGNOSIS — D509 Iron deficiency anemia, unspecified: Secondary | ICD-10-CM | POA: Diagnosis not present

## 2019-10-02 DIAGNOSIS — Z79899 Other long term (current) drug therapy: Secondary | ICD-10-CM | POA: Diagnosis not present

## 2019-10-02 DIAGNOSIS — D500 Iron deficiency anemia secondary to blood loss (chronic): Secondary | ICD-10-CM | POA: Diagnosis not present

## 2019-10-02 DIAGNOSIS — Z9079 Acquired absence of other genital organ(s): Secondary | ICD-10-CM | POA: Diagnosis not present

## 2019-10-02 NOTE — Progress Notes (Signed)
[place] Telephone:([phone]   Fax:([phone]) [phone]  OFFICE PROGRESS NOTE  [name], MD [address] [place] [id number]  DIAGNOSIS:  1) monoclonal gammopathy of undetermined significance.  2) iron deficiency anemia.  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 Tresa Moore.   PRIOR THERAPY: Feraheme infusion x 2 doses, last dose was given in October of 2014.  CURRENT THERAPY: Over-the-counter ferrous sulfate.  INTERVAL HISTORY: Leonard [name]. 73 y.o. male returns to the clinic today for annual follow-up visit.  The patient is feeling fine today with no concerning complaints except for mild shortness of breath with exertion.  He denied having any current chest pain, cough or hemoptysis.  He denied having any fever or chills.  He has no nausea, vomiting, diarrhea or constipation.  He has no headache or visual changes.  He had repeat myeloma panel as well as iron study and ferritin performed recently and he is here for evaluation and discussion of his lab results.   MEDICAL HISTORY: Past Medical History:  Diagnosis Date  . 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 Coronary artery disease   . Diverticulosis of colon (without mention of hemorrhage) 09/05/2006   Colonoscopy   . Gastric polyp 2014   done at [facility]  . GERD (gastroesophageal reflux disease) 07/26/2001   EGD(Chronic)  . Gouty arthropathy   . Hemorrhoids   . Hiatal hernia 09/05/2006   EGD  . Hx of colonic polyps 09/05/2006   Colonoscopy(ADENOMATOUS POLYP)  . Iron deficiency anemia, unspecified   . Monoclonal gammopathy   . OSA (obstructive sleep apnea)    USES CPAP MACHINE   . Other and unspecified hyperlipidemia   . Prostate cancer ([place]) 2015  . Type II or unspecified type diabetes mellitus  without mention of complication, not stated as uncontrolled   . Unspecified essential hypertension     ALLERGIES:  is allergic to dicloxacillin; cayenne pepper [cayenne]; and nsaids.  MEDICATIONS:  Current Outpatient Medications  Medication Sig Dispense Refill  . allopurinol (ZYLOPRIM) 300 MG tablet TAKE 1 TABLET BY MOUTH DAILY TO PREVENT GOUT 90 tablet 3  . amLODipine-benazepril (LOTREL) 10-20 MG capsule TAKE 1 CAPSULE BY MOUTH EVERY DAY 90 capsule 3  . aspirin 81 MG tablet Take 81 mg by mouth daily.    Marland Kitchen atorvastatin (LIPITOR) 10 MG tablet TAKE 1 TABLET BY MOUTH EVERY DAY 90 tablet 3  . ciprofloxacin (CIPRO) 500 MG tablet Take 1 tablet (500 mg total) by mouth 2 (two) times daily. 20 tablet 0  . esomeprazole (NEXIUM) 40 MG capsule TAKE 1 CAPSULE BY MOUTH EVERY DAY 30 capsule 11  . FeFum-FePoly-FA-B Cmp-C-Biot (FOLIVANE-PLUS) CAPS TAKE 1 CAPSULE BY MOUTH EVERY DAY IN THE MORNING 90 capsule 1  . ferrous sulfate 325 (65 FE) MG EC tablet Take 325 mg by mouth daily with breakfast.    . JANUVIA 50 MG tablet TAKE 1 TABLET BY MOUTH EVERY DAY 90 tablet 3  . nitroGLYCERIN (NITROSTAT) 0.4 MG SL tablet Place 0.4 mg under the tongue every 5 (five) minutes as needed for chest pain. Reported on 03/04/2016    . triamterene-hydrochlorothiazide (MAXZIDE-25) 37.5-25 MG tablet TAKE 1 TABLET BY MOUTH DAILY. PLEASE SCHEDULE AN APPT FOR FURTHER REFILLS. 1ST ATTEMPT 90 tablet 3   No current facility-administered medications for this visit.  SURGICAL HISTORY:  Past Surgical History:  Procedure Laterality Date  . ESOPHAGOGASTRODUODENOSCOPY     Done at [facility]  . ESOPHAGOGASTRODUODENOSCOPY (EGD) WITH PROPOFOL N/A 11/04/2017   Procedure: ESOPHAGOGASTRODUODENOSCOPY (EGD) WITH PROPOFOL;  Surgeon: Jerene Bears, MD;  [facility];  Service: Gastroenterology;  Laterality: N/A;  . GI PROSTATE BIOPSY    . HEMORRHOID SURGERY    . INCISIONAL HERNIA REPAIR N/A 08/07/2015   Procedure: LAPAROSCOPIC INCISIONAL  HERNIA repair with mesh;  Surgeon: Arta Bruce Kinsinger, MD;  Location: [facility];  Service: General;  Laterality: N/A;  . KNEE SURGERY  1999   TUMOR REMOVED LEFT KNEE & ARTHROSCOPIC SURGERY  . LYMPHADENECTOMY Bilateral 05/01/2014   Procedure: LYMPHADENECTOMY;  Surgeon: Alexis Frock, MD;  [facility];  Service: Urology;  Laterality: Bilateral;  . ROBOT ASSISTED LAPAROSCOPIC RADICAL PROSTATECTOMY N/A 05/01/2014   Procedure: ROBOTIC ASSISTED LAPAROSCOPIC RADICAL PROSTATECTOMY WITH INDOCYANINE GREEN DYE;  Surgeon: Alexis Frock, MD;  [facility];  Service: Urology;  Laterality: N/A;    REVIEW OF SYSTEMS:  A comprehensive review of systems was negative except for: Musculoskeletal: positive for arthralgias   PHYSICAL EXAMINATION: General appearance: alert, cooperative and no distress Head: Normocephalic, without obvious abnormality, atraumatic Neck: no adenopathy and thyroid not enlarged, symmetric, no tenderness/mass/nodules Lymph nodes: Cervical, supraclavicular, and axillary nodes normal. Resp: clear to auscultation bilaterally Back: symmetric, no curvature. ROM normal. No CVA tenderness. Cardio: irregularly irregular rhythm GI: soft, non-tender; bowel sounds normal; no masses,  no organomegaly Extremities: extremities normal, atraumatic, no cyanosis or edema  ECOG PERFORMANCE STATUS: 1 - Symptomatic but completely ambulatory  Blood pressure 134/81, pulse 80, temperature 98.2 F (36.8 C), temperature source Temporal, resp. rate 16, height 5' 9.75" (1.772 m), weight 285 lb 11.2 oz (129.6 kg), SpO2 96 %.  LABORATORY DATA: Lab Results  Component Value Date   WBC 11.1 (H) 09/25/2019   HGB 11.5 (L) 09/25/2019   HCT 35.5 (L) 09/25/2019   MCV 95.9 09/25/2019   PLT 268 09/25/2019      Chemistry      Component Value Date/Time   NA 141 09/25/2019 0820   NA 140 09/14/2017 0928   K 4.0 09/25/2019 0820   K 4.0 09/14/2017 0928   CL 104 09/25/2019 0820   CL 104 04/17/2013 0857    CO2 24 09/25/2019 0820   CO2 27 09/14/2017 0928   BUN 19 09/25/2019 0820   BUN 28.3 (H) 09/14/2017 0928   CREATININE 1.35 (H) 09/25/2019 0820   CREATININE 1.42 (H) 08/28/2019 1117   CREATININE 1.4 (H) 09/14/2017 0928      Component Value Date/Time   CALCIUM 9.5 09/25/2019 0820   CALCIUM 10.1 09/14/2017 0928   ALKPHOS 72 09/25/2019 0820   ALKPHOS 80 09/14/2017 0928   AST 15 09/25/2019 0820   AST 17 09/14/2017 0928   ALT 19 09/25/2019 0820   ALT 18 09/14/2017 0928   BILITOT 0.4 09/25/2019 0820   BILITOT 0.45 09/14/2017 0928     RADIOGRAPHIC STUDIES: No results found.  ASSESSMENT AND PLAN:  This is a very pleasant 72 years old African-American male with iron deficiency anemia secondary to history of gastrointestinal blood loss.  The patient is currently on observation and he has been doing fine with no concerning complaints. His iron study is unremarkable and the patient continues to have mild anemia. His myeloma panel showed mild increase of free kappa light chain but no significant evidence for disease progression. I recommended for the patient to continue on observation  for now as well as over-the-counter iron supplement with repeat myeloma panel and iron study in 1 year. He was advised to call immediately if he has any concerning symptoms in the interval. The patient voices understanding of current disease status and treatment options and is in agreement with the current care plan. All questions were answered. The patient knows to call the clinic with any problems, questions or concerns. We can certainly see the patient much sooner if necessary. I spent 10 minutes counseling the patient face to face. The total time spent in the appointment was 15 minutes.  Disclaimer: This note was dictated with voice recognition software. Similar sounding words can inadvertently be transcribed and may not be corrected upon review.

## 2019-10-10 ENCOUNTER — Other Ambulatory Visit: Payer: Self-pay

## 2019-11-12 ENCOUNTER — Other Ambulatory Visit: Payer: Self-pay | Admitting: Internal Medicine

## 2019-11-12 DIAGNOSIS — D649 Anemia, unspecified: Secondary | ICD-10-CM

## 2019-11-30 ENCOUNTER — Other Ambulatory Visit: Payer: Self-pay | Admitting: Internal Medicine

## 2019-12-11 DIAGNOSIS — N393 Stress incontinence (female) (male): Secondary | ICD-10-CM | POA: Diagnosis not present

## 2019-12-11 DIAGNOSIS — N10 Acute tubulo-interstitial nephritis: Secondary | ICD-10-CM | POA: Diagnosis not present

## 2019-12-11 DIAGNOSIS — C61 Malignant neoplasm of prostate: Secondary | ICD-10-CM | POA: Diagnosis not present

## 2020-01-09 ENCOUNTER — Other Ambulatory Visit: Payer: Self-pay | Admitting: Internal Medicine

## 2020-01-15 ENCOUNTER — Other Ambulatory Visit: Payer: Self-pay

## 2020-01-15 ENCOUNTER — Other Ambulatory Visit: Payer: Medicare Other | Admitting: Internal Medicine

## 2020-01-15 DIAGNOSIS — E119 Type 2 diabetes mellitus without complications: Secondary | ICD-10-CM

## 2020-01-15 DIAGNOSIS — E785 Hyperlipidemia, unspecified: Secondary | ICD-10-CM | POA: Diagnosis not present

## 2020-01-15 DIAGNOSIS — E1169 Type 2 diabetes mellitus with other specified complication: Secondary | ICD-10-CM

## 2020-01-16 LAB — HEMOGLOBIN A1C
Hgb A1c MFr Bld: 6.6 % of total Hgb — ABNORMAL HIGH (ref <5.7)
Mean Plasma Glucose: 143 (calc)
eAG (mmol/L): 7.9 (calc)

## 2020-01-16 LAB — LIPID PANEL
Cholesterol: 140 mg/dL (ref <200)
HDL: 66 mg/dL (ref >=40)
LDL Cholesterol (Calc): 61 mg/dL (calc)
Non-HDL Cholesterol (Calc): 74 mg/dL (calc) (ref <130)
Total CHOL/HDL Ratio: 2.1 (calc) (ref <5.0)
Triglycerides: 57 mg/dL (ref <150)

## 2020-01-16 LAB — HEPATIC FUNCTION PANEL
AG Ratio: 1.3 (calc) (ref 1.0–2.5)
ALT: 16 U/L (ref 9–46)
AST: 16 U/L (ref 10–35)
Albumin: 3.9 g/dL (ref 3.6–5.1)
Alkaline phosphatase (APISO): 72 U/L (ref 35–144)
Bilirubin, Direct: 0.1 mg/dL (ref 0.0–0.2)
Globulin: 2.9 g/dL (calc) (ref 1.9–3.7)
Indirect Bilirubin: 0.4 mg/dL (calc) (ref 0.2–1.2)
Total Bilirubin: 0.5 mg/dL (ref 0.2–1.2)
Total Protein: 6.8 g/dL (ref 6.1–8.1)

## 2020-01-22 ENCOUNTER — Encounter: Payer: Self-pay | Admitting: Internal Medicine

## 2020-01-22 ENCOUNTER — Ambulatory Visit (INDEPENDENT_AMBULATORY_CARE_PROVIDER_SITE_OTHER): Payer: Medicare Other | Admitting: Internal Medicine

## 2020-01-22 ENCOUNTER — Other Ambulatory Visit: Payer: Self-pay

## 2020-01-22 VITALS — BP 110/80 | HR 89 | Temp 98.0°F | Ht 69.75 in | Wt 282.0 lb

## 2020-01-22 DIAGNOSIS — Z8639 Personal history of other endocrine, nutritional and metabolic disease: Secondary | ICD-10-CM | POA: Diagnosis not present

## 2020-01-22 DIAGNOSIS — E119 Type 2 diabetes mellitus without complications: Secondary | ICD-10-CM | POA: Diagnosis not present

## 2020-01-22 DIAGNOSIS — Z6841 Body Mass Index (BMI) 40.0 and over, adult: Secondary | ICD-10-CM

## 2020-01-22 DIAGNOSIS — E1169 Type 2 diabetes mellitus with other specified complication: Secondary | ICD-10-CM

## 2020-01-22 DIAGNOSIS — N393 Stress incontinence (female) (male): Secondary | ICD-10-CM | POA: Diagnosis not present

## 2020-01-22 DIAGNOSIS — J22 Unspecified acute lower respiratory infection: Secondary | ICD-10-CM | POA: Diagnosis not present

## 2020-01-22 DIAGNOSIS — Z8546 Personal history of malignant neoplasm of prostate: Secondary | ICD-10-CM

## 2020-01-22 DIAGNOSIS — I2510 Atherosclerotic heart disease of native coronary artery without angina pectoris: Secondary | ICD-10-CM

## 2020-01-22 DIAGNOSIS — E785 Hyperlipidemia, unspecified: Secondary | ICD-10-CM

## 2020-01-22 DIAGNOSIS — H6123 Impacted cerumen, bilateral: Secondary | ICD-10-CM

## 2020-01-22 DIAGNOSIS — Z8739 Personal history of other diseases of the musculoskeletal system and connective tissue: Secondary | ICD-10-CM

## 2020-01-22 DIAGNOSIS — D472 Monoclonal gammopathy: Secondary | ICD-10-CM | POA: Diagnosis not present

## 2020-01-22 DIAGNOSIS — I10 Essential (primary) hypertension: Secondary | ICD-10-CM | POA: Diagnosis not present

## 2020-01-22 DIAGNOSIS — E7849 Other hyperlipidemia: Secondary | ICD-10-CM

## 2020-01-22 MED ORDER — AZITHROMYCIN 250 MG PO TABS: ORAL_TABLET | ORAL | 0 refills | Status: DC

## 2020-01-22 MED ORDER — HYDROCODONE-HOMATROPINE 5-1.5 MG/5ML PO SYRP: 5.0000 mL | ORAL_SOLUTION | Freq: Three times a day (TID) | ORAL | 0 refills | Status: DC | PRN

## 2020-01-22 NOTE — Progress Notes (Signed)
   Subjective:    Patient ID: Leonard [name]., male    DOB: 1946/09/15, 74 y.o.   MRN: [id number]  HPI 74 year old Male for 6 month follow up. History of HTN,DM, hy morbid obesity,MGUS which is followed by Hematology,GERD,hx CAD, hyperlipidemia in for 6 month recheck. Remote hx prostatectomy for prostate cancer.   Doing well with no new complaints except for cerumen in ears. Will refer to ENT for removal.  Not exercising much. Weather has not been good recently.   Needs to diet, exercise and lose weight but not really motivated.  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 Still grieving loss of stepdaughter as is his wife. She lived with them.  MGUS followed by Dr. Julien Nordmann and is stable.Also has hx of iron deficiency anemia followed by Hematology and treated with iron supplement  Hx OSA seen by Dr. Halford Chessman.  In October 2020 had pyelonephritis seen by Urology. Hx prostatectomy 2015 by Dr. Tresa Moore for carcinoma and has done well. Hx of hypogonadism and erectile dysfunction. Androgel currently not being used due to hx of prostate cancer. Has stress incontinence.  Hx CAD followed by Dr. Harrington Challenger. Hx HTN stable on amlodipine/benazepril 10/20 and Maxzide 25  Hx gout treated with Allopurinol and stable.  Hx GERD treated with Nexium.  Hx impaired glucose tolerance treated with Januvia. Hgb AIC stabel at 6.6%    Review of Systems no new complaints     Objective:   Physical Exam Weight 282 pounds. BP 110/80 pulse 89 temp 98 degrees, BMI 40.75  Skin warm and dry.  Nodes none.  TMs obscured because of cerumen in the external ear canals.  Neck is supple without JVD thyromegaly or carotid bruits.  Chest clear to auscultation.  Cardiac exam regular rate and rhythm normal S1 and S2 without murmurs or gallops.  Extremities without edema.  Thought judgment and affect are normal.  Prostate exam deferred to Dr. Tresa Moore.       Assessment & Plan:  Morbid obesity-continue to encourage him to diet exercise and lose  weight.  May want to consider Dr. Migdalia Dk clinic for weight loss.  History of coronary artery disease followed by Dr. Harrington Challenger  Hyperlipidemia-lipids normal on statin medication  MGUS followed by Earlie Server  Iron deficiency anemia followed by Dr. Earlie Server  History of gout treated with allopurinol  Cerumen in ears-refer to ENT for removal  Impaired glucose tolerance/diabetes mellitus treated with Januvia and hemoglobin A1c stable at 6.6%  History of prostate cancer status post prostatectomy followed by Dr. Tresa Moore  Stress urinary incontinence followed by Dr. Tresa Moore  Hypertension stable on current regimen  Plan: Continue to encourage him to diet exercise and lose weight but he is not really motivated.  Continue current medications and follow-up in 6 months at which time he will have Medicare wellness exam health maintenance exam and fasting labs.  He will be referred to ENT to have cerumen removed.

## 2020-01-30 NOTE — Patient Instructions (Signed)
You are being referred to ENT to have cerumen removed from your ears.  Other medical issues are stable at present time.  Please try to diet exercise and lose some weight.  Follow-up for health maintenance exam in 6 months.  No change in medical regimen at this time.  It was a pleasure to see you today.

## 2020-02-07 DIAGNOSIS — H6122 Impacted cerumen, left ear: Secondary | ICD-10-CM | POA: Diagnosis not present

## 2020-05-05 ENCOUNTER — Telehealth: Payer: Self-pay | Admitting: Internal Medicine

## 2020-05-05 ENCOUNTER — Ambulatory Visit (INDEPENDENT_AMBULATORY_CARE_PROVIDER_SITE_OTHER): Payer: Medicare Other | Admitting: Internal Medicine

## 2020-05-05 ENCOUNTER — Other Ambulatory Visit: Payer: Self-pay

## 2020-05-05 ENCOUNTER — Encounter: Payer: Self-pay | Admitting: Internal Medicine

## 2020-05-05 ENCOUNTER — Other Ambulatory Visit: Payer: Self-pay | Admitting: Internal Medicine

## 2020-05-05 VITALS — BP 110/70 | Ht 69.75 in | Wt 284.0 lb

## 2020-05-05 DIAGNOSIS — M5416 Radiculopathy, lumbar region: Secondary | ICD-10-CM

## 2020-05-05 DIAGNOSIS — I2510 Atherosclerotic heart disease of native coronary artery without angina pectoris: Secondary | ICD-10-CM

## 2020-05-05 DIAGNOSIS — D649 Anemia, unspecified: Secondary | ICD-10-CM

## 2020-05-05 MED ORDER — NITROGLYCERIN 0.4 MG SL SUBL: 0.4000 mg | SUBLINGUAL_TABLET | SUBLINGUAL | 5 refills | Status: AC | PRN

## 2020-05-05 MED ORDER — PREDNISONE 10 MG PO TABS: ORAL_TABLET | ORAL | 1 refills | Status: DC

## 2020-05-05 MED ORDER — OXYCODONE-ACETAMINOPHEN 10-325 MG PO TABS: 1.0000 | ORAL_TABLET | Freq: Three times a day (TID) | ORAL | 0 refills | Status: AC | PRN

## 2020-05-05 NOTE — Telephone Encounter (Signed)
Just hip pain, aching hard to walk.

## 2020-05-05 NOTE — Telephone Encounter (Signed)
Left message to call back  

## 2020-05-05 NOTE — Telephone Encounter (Signed)
Please get more info. Is this orthopedic hip/back pain or abdominal pain. Likely needs OV. At one point had a UTI.

## 2020-05-05 NOTE — Telephone Encounter (Signed)
Leonard [name] [phone]  Henery called to say on Saturday he woke up with right side hip pain

## 2020-05-05 NOTE — Patient Instructions (Signed)
While driving to [place] you will need to take frequent breaks to stretch.  Take prednisone in tapering course starting with 6 tabs day 1 and decreasing by 1 tab daily over 6 days.  Take oxycodone APAP with food sparingly for pain.  If not improving will need physical therapy.

## 2020-05-05 NOTE — Progress Notes (Signed)
   Subjective:    Patient ID: Leonard [name]., male    DOB: Mar 15, 1946, 74 y.o.   MRN: [id number]  HPI Rev. Micucci drove to [facility] recently to visit family and spent the night. Developed pain in right buttock while there radiating into right lower extremity. No numbness or paresthesias in leg.  He is planning on driving to [facility] around July 6 to attend a conference at a convention center for several days.  Seems to be in a moderate amount of pain today.  Moving slowly.      Review of Systems see above     Objective:   Physical Exam Blood pressure 110/70 weight 284 pounds BMI 41.04.  He is able to get on the exam table.  Straight leg raising is negative at 90 degrees on the right.  His muscle strength in the right lower extremity is 5/5 in all groups tested.  Deep tendon reflex 2+ in the knee.  Range of motion in the trunk is fair.       Assessment & Plan:  Right lumbar radiculopathy  BMI 41.04-would benefit from diet exercise and weight loss.  This has been discussed with him previously.  Controlled type 2 diabetes mellitus and hemoglobin A1c was stable at 6.6% in March on Januvia 50 mg daily  Essential hypertension stable on current regimen  Hyperlipidemia treated with statin  History of sleep apnea  History of GE reflux treated with PPI  History of gout  History of prostate cancer followed by urology  Plan: Since he is planning another trip we need to get him some relief fairly quickly.  I am starting him on a prednisone taper starting with 6 tablets day 1 and decreasing by 10 mg daily over 6 days.  He tried taking hydrocodone and did not get pain relief so I am giving him oxycodone APAP 10/325 take sparingly daily 8 to 12 hours as needed for pain.  If not improving, he may well need physical therapy.  Explained to him an MRI may not be approved by insurance until he has had 2 to 3 weeks of physical therapy without improvement.

## 2020-06-12 ENCOUNTER — Ambulatory Visit: Payer: Medicare Other | Admitting: Internal Medicine

## 2020-06-12 ENCOUNTER — Telehealth: Payer: Self-pay | Admitting: Internal Medicine

## 2020-06-12 NOTE — Telephone Encounter (Signed)
scheduled

## 2020-06-12 NOTE — Telephone Encounter (Signed)
Leonard [name] [phone]  [name] called to say Wednesday morning when he woke up he notice a knot on his right shoulder , close to his neck. He would like to come in for you to look at it.

## 2020-06-12 NOTE — Telephone Encounter (Signed)
OK today 

## 2020-06-13 ENCOUNTER — Ambulatory Visit (INDEPENDENT_AMBULATORY_CARE_PROVIDER_SITE_OTHER): Payer: Medicare Other | Admitting: Internal Medicine

## 2020-06-13 ENCOUNTER — Other Ambulatory Visit: Payer: Self-pay | Admitting: Internal Medicine

## 2020-06-13 ENCOUNTER — Other Ambulatory Visit: Payer: Self-pay

## 2020-06-13 ENCOUNTER — Encounter: Payer: Self-pay | Admitting: Internal Medicine

## 2020-06-13 VITALS — BP 100/60 | HR 77 | Ht 69.75 in | Wt 278.0 lb

## 2020-06-13 DIAGNOSIS — N393 Stress incontinence (female) (male): Secondary | ICD-10-CM | POA: Diagnosis not present

## 2020-06-13 DIAGNOSIS — Z8546 Personal history of malignant neoplasm of prostate: Secondary | ICD-10-CM

## 2020-06-13 DIAGNOSIS — R300 Dysuria: Secondary | ICD-10-CM | POA: Diagnosis not present

## 2020-06-13 DIAGNOSIS — I10 Essential (primary) hypertension: Secondary | ICD-10-CM | POA: Diagnosis not present

## 2020-06-13 DIAGNOSIS — Z6841 Body Mass Index (BMI) 40.0 and over, adult: Secondary | ICD-10-CM | POA: Diagnosis not present

## 2020-06-13 DIAGNOSIS — R2231 Localized swelling, mass and lump, right upper limb: Secondary | ICD-10-CM | POA: Diagnosis not present

## 2020-06-13 DIAGNOSIS — Z8739 Personal history of other diseases of the musculoskeletal system and connective tissue: Secondary | ICD-10-CM

## 2020-06-13 DIAGNOSIS — Z87440 Personal history of urinary (tract) infections: Secondary | ICD-10-CM

## 2020-06-13 DIAGNOSIS — E119 Type 2 diabetes mellitus without complications: Secondary | ICD-10-CM

## 2020-06-13 DIAGNOSIS — E7849 Other hyperlipidemia: Secondary | ICD-10-CM | POA: Diagnosis not present

## 2020-06-13 DIAGNOSIS — N183 Chronic kidney disease, stage 3 unspecified: Secondary | ICD-10-CM

## 2020-06-13 DIAGNOSIS — Z8639 Personal history of other endocrine, nutritional and metabolic disease: Secondary | ICD-10-CM | POA: Diagnosis not present

## 2020-06-13 DIAGNOSIS — I2510 Atherosclerotic heart disease of native coronary artery without angina pectoris: Secondary | ICD-10-CM

## 2020-06-13 LAB — CBC WITH DIFFERENTIAL/PLATELET
Absolute Monocytes: 551 cells/uL (ref 200–950)
Basophils Absolute: 32 cells/uL (ref 0–200)
Basophils Relative: 0.3 %
Eosinophils Absolute: 130 cells/uL (ref 15–500)
Eosinophils Relative: 1.2 %
HCT: 36.8 % — ABNORMAL LOW (ref 38.5–50.0)
Hemoglobin: 12.1 g/dL — ABNORMAL LOW (ref 13.2–17.1)
Lymphs Abs: 3251 cells/uL (ref 850–3900)
MCH: 31.1 pg (ref 27.0–33.0)
MCHC: 32.9 g/dL (ref 32.0–36.0)
MCV: 94.6 fL (ref 80.0–100.0)
MPV: 9.9 fL (ref 7.5–12.5)
Monocytes Relative: 5.1 %
Neutro Abs: 6836 cells/uL (ref 1500–7800)
Neutrophils Relative %: 63.3 %
Platelets: 301 10*3/uL (ref 140–400)
RBC: 3.89 10*6/uL — ABNORMAL LOW (ref 4.20–5.80)
RDW: 14.4 % (ref 11.0–15.0)
Total Lymphocyte: 30.1 %
WBC: 10.8 10*3/uL (ref 3.8–10.8)

## 2020-06-13 LAB — POCT URINALYSIS DIPSTICK
Appearance: NEGATIVE
Bilirubin, UA: NEGATIVE
Blood, UA: NEGATIVE
Glucose, UA: NEGATIVE
Ketones, UA: NEGATIVE
Leukocytes, UA: NEGATIVE
Nitrite, UA: NEGATIVE
Odor: NEGATIVE
Protein, UA: NEGATIVE
Spec Grav, UA: 1.01 (ref 1.010–1.025)
Urobilinogen, UA: 0.2 E.U./dL
pH, UA: 6.5 (ref 5.0–8.0)

## 2020-06-13 MED ORDER — CIPROFLOXACIN HCL 500 MG PO TABS: 500.0000 mg | ORAL_TABLET | Freq: Two times a day (BID) | ORAL | 0 refills | Status: DC

## 2020-06-13 NOTE — Patient Instructions (Addendum)
Urine culture pending.  Due to dysuria, start Cipro 500 mg twice daily for 10 days.  MRI ordered of the right shoulder for evaluation of mass.

## 2020-06-13 NOTE — Progress Notes (Signed)
   Subjective:    Patient ID: Leonard [name]., male    DOB: 1946-04-23, 74 y.o.   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  Yesterday he was in the emergency department with his stepdaughter who was ill.  He spent several hours later but apparently she was released and he and his wife were relieved.  He has noticed today, a swelling right shoulder midway between neck and glenohumeral joint.  Says it is nontender.  Says he just saw wife today and it apparently appeared suddenly.  Denies any injury or known insect bite to that area.  Has good range of motion in his shoulder.  This was very concerning to him as it appeared suddenly.  History of chronic kidney disease stage IIIa.  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  History of obstructive sleep apnea.  History of iron deficiency.  History of gout.  In November 2020 he is myeloma panel shows mild increase in free kappa light chains.  Iron study was unremarkable.  He was mildly anemic.  He was advised to follow-up with Dr. Earlie Server in 1 year.    Review of Systems see above-no recent immunizations Also complaining of some urinary tract infection symptoms.  Dipstick UA is normal.  Culture was sent.  He does have a history of severe urinary tract infection in October 2020.    Objective:   Physical Exam Blood pressure 100/68 weight 278 pounds  He has a fluctuant area on the top of his right shoulder midway between the neck and right glenohumeral joint that is approximately 6 to 7 cm in diameter.  It is nontender.  There is no evidence of insect bite.  No necrosis.  No increased warmth.     Assessment & Plan:  Fluctuant lesion top of right shoulder midway between neck and right glenohumeral joint-etiology  unclear  Dysuria-possible UTI.  Urine culture obtained.  Start Cipro 500 mg twice daily for 10 days pending culture results.  History of iron deficiency treated with iron supplement  History of MGUS followed by Dr. Earlie Server  Hyperlipidemia treated with statin  Hypertension stable on current regimen  Diabetes mellitus treated with Januvia  GE reflux treated with Nexium  Obesity  History of prostate cancer status post prostatectomy  Plan: Urine culture pending.  Start Cipro 500 mg twice daily for 10 days for presumed UTI given complaints of dysuria.  Have ordered MRI of right shoulder to evaluate fluctuant mass after the Radiologist indicated this would be a better test than a CT of the right shoulder.

## 2020-06-14 LAB — URINE CULTURE
MICRO NUMBER:: 10770193
Result:: NO GROWTH
SPECIMEN QUALITY:: ADEQUATE

## 2020-07-07 ENCOUNTER — Ambulatory Visit
Admission: RE | Admit: 2020-07-07 | Discharge: 2020-07-07 | Disposition: A | Discharge status: Home / Self Care | Payer: Medicare Other | Source: Ambulatory Visit | Attending: Internal Medicine | Admitting: Internal Medicine

## 2020-07-07 ENCOUNTER — Other Ambulatory Visit: Payer: Self-pay

## 2020-07-07 ENCOUNTER — Encounter: Payer: Self-pay | Admitting: Internal Medicine

## 2020-07-07 ENCOUNTER — Telehealth: Payer: Self-pay | Admitting: Medical Oncology

## 2020-07-07 ENCOUNTER — Telehealth: Payer: Self-pay | Admitting: Internal Medicine

## 2020-07-07 DIAGNOSIS — M19011 Primary osteoarthritis, right shoulder: Secondary | ICD-10-CM | POA: Diagnosis not present

## 2020-07-07 DIAGNOSIS — R2231 Localized swelling, mass and lump, right upper limb: Secondary | ICD-10-CM

## 2020-07-07 DIAGNOSIS — R222 Localized swelling, mass and lump, trunk: Secondary | ICD-10-CM | POA: Diagnosis not present

## 2020-07-07 DIAGNOSIS — M75101 Unspecified rotator cuff tear or rupture of right shoulder, not specified as traumatic: Secondary | ICD-10-CM | POA: Diagnosis not present

## 2020-07-07 MED ORDER — GADOBENATE DIMEGLUMINE 529 MG/ML IV SOLN
20.0000 mL | Freq: Once | INTRAVENOUS | Status: AC | PRN
  Administered 2020-07-07: 20 mL via INTRAVENOUS

## 2020-07-07 NOTE — Telephone Encounter (Signed)
Phone call to patient about MRI results. We are getting in touch with Dr. Julien Nordmann to see what is recommended as next step.

## 2020-07-07 NOTE — Telephone Encounter (Signed)
New soft tissue mass MRI -R shoulder Dr Renold Genta has notified pt of result. When can [facility] see him?  "IMPRESSION: 1. Well-circumscribed avidly enhancing soft tissue mass adjacent to the superior margin of the right trapezius muscle measuring up to 5.5 cm with a small amount of internal T1 hyperintense signal. Differential includes soft tissue sarcoma such as a liposarcoma versus an extraosseous plasmacytoma given history of MGUS. Orthopedic oncologic consultation is recommended. 2. Low-grade articular sided tear of the anterior aspect of the distal supraspinatus tendon. 3. Mild AC joint arthropathy with mild subacromial-subdeltoid bursal Fluid."

## 2020-07-08 ENCOUNTER — Other Ambulatory Visit: Payer: Medicare Other | Admitting: Internal Medicine

## 2020-07-08 ENCOUNTER — Encounter: Payer: Self-pay | Admitting: Internal Medicine

## 2020-07-08 ENCOUNTER — Telehealth: Payer: Self-pay | Admitting: Internal Medicine

## 2020-07-08 DIAGNOSIS — R2231 Localized swelling, mass and lump, right upper limb: Secondary | ICD-10-CM | POA: Diagnosis not present

## 2020-07-08 DIAGNOSIS — Z125 Encounter for screening for malignant neoplasm of prostate: Secondary | ICD-10-CM

## 2020-07-08 DIAGNOSIS — E119 Type 2 diabetes mellitus without complications: Secondary | ICD-10-CM

## 2020-07-08 DIAGNOSIS — N393 Stress incontinence (female) (male): Secondary | ICD-10-CM

## 2020-07-08 DIAGNOSIS — E7849 Other hyperlipidemia: Secondary | ICD-10-CM

## 2020-07-08 DIAGNOSIS — N183 Chronic kidney disease, stage 3 unspecified: Secondary | ICD-10-CM

## 2020-07-08 DIAGNOSIS — Z6841 Body Mass Index (BMI) 40.0 and over, adult: Secondary | ICD-10-CM | POA: Diagnosis not present

## 2020-07-08 DIAGNOSIS — I10 Essential (primary) hypertension: Secondary | ICD-10-CM | POA: Diagnosis not present

## 2020-07-08 DIAGNOSIS — I2510 Atherosclerotic heart disease of native coronary artery without angina pectoris: Secondary | ICD-10-CM | POA: Diagnosis not present

## 2020-07-08 DIAGNOSIS — D472 Monoclonal gammopathy: Secondary | ICD-10-CM | POA: Diagnosis not present

## 2020-07-08 NOTE — Telephone Encounter (Signed)
Spoke with patient again about results of MRI right shoulder and mass which radiologist suggested could be sarcoma or plasmacytoma. Dr. Julien Nordmann suggests that we go ahead and obtain surgical consult. Have messaged Dr. Barry Dienes today who says she can see him next week. Patient prefers to be seen after Tuesday of next week

## 2020-07-08 NOTE — Telephone Encounter (Signed)
Have messaged Dr. Barry Dienes about consult.

## 2020-07-09 ENCOUNTER — Telehealth: Payer: Self-pay | Admitting: Internal Medicine

## 2020-07-09 LAB — CBC WITH DIFFERENTIAL/PLATELET
Absolute Monocytes: 657 cells/uL (ref 200–950)
Basophils Absolute: 27 cells/uL (ref 0–200)
Basophils Relative: 0.3 %
Eosinophils Absolute: 207 cells/uL (ref 15–500)
Eosinophils Relative: 2.3 %
HCT: 35.9 % — ABNORMAL LOW (ref 38.5–50.0)
Hemoglobin: 12 g/dL — ABNORMAL LOW (ref 13.2–17.1)
Lymphs Abs: 3258 cells/uL (ref 850–3900)
MCH: 31.7 pg (ref 27.0–33.0)
MCHC: 33.4 g/dL (ref 32.0–36.0)
MCV: 94.7 fL (ref 80.0–100.0)
MPV: 9.9 fL (ref 7.5–12.5)
Monocytes Relative: 7.3 %
Neutro Abs: 4851 cells/uL (ref 1500–7800)
Neutrophils Relative %: 53.9 %
Platelets: 281 10*3/uL (ref 140–400)
RBC: 3.79 10*6/uL — ABNORMAL LOW (ref 4.20–5.80)
RDW: 14.1 % (ref 11.0–15.0)
Total Lymphocyte: 36.2 %
WBC: 9 10*3/uL (ref 3.8–10.8)

## 2020-07-09 LAB — LIPID PANEL
Cholesterol: 143 mg/dL (ref <200)
HDL: 74 mg/dL (ref >=40)
LDL Cholesterol (Calc): 55 mg/dL (calc)
Non-HDL Cholesterol (Calc): 69 mg/dL (calc) (ref <130)
Total CHOL/HDL Ratio: 1.9 (calc) (ref <5.0)
Triglycerides: 67 mg/dL (ref <150)

## 2020-07-09 LAB — COMPLETE METABOLIC PANEL WITH GFR
AG Ratio: 1.4 (calc) (ref 1.0–2.5)
ALT: 16 U/L (ref 9–46)
AST: 16 U/L (ref 10–35)
Albumin: 4 g/dL (ref 3.6–5.1)
Alkaline phosphatase (APISO): 74 U/L (ref 35–144)
BUN/Creatinine Ratio: 17 (calc) (ref 6–22)
BUN: 24 mg/dL (ref 7–25)
CO2: 25 mmol/L (ref 20–32)
Calcium: 9.3 mg/dL (ref 8.6–10.3)
Chloride: 101 mmol/L (ref 98–110)
Creat: 1.39 mg/dL — ABNORMAL HIGH (ref 0.70–1.18)
GFR, Est African American: 58 mL/min/{1.73_m2} — ABNORMAL LOW (ref >=60)
GFR, Est Non African American: 50 mL/min/{1.73_m2} — ABNORMAL LOW (ref >=60)
Globulin: 2.8 g/dL (calc) (ref 1.9–3.7)
Glucose, Bld: 106 mg/dL — ABNORMAL HIGH (ref 65–99)
Potassium: 4.2 mmol/L (ref 3.5–5.3)
Sodium: 137 mmol/L (ref 135–146)
Total Bilirubin: 0.3 mg/dL (ref 0.2–1.2)
Total Protein: 6.8 g/dL (ref 6.1–8.1)

## 2020-07-09 LAB — HEMOGLOBIN A1C
Hgb A1c MFr Bld: 6.4 % of total Hgb — ABNORMAL HIGH (ref <5.7)
Mean Plasma Glucose: 137 (calc)
eAG (mmol/L): 7.6 (calc)

## 2020-07-09 LAB — PSA: PSA: <0.1 ng/mL (ref <=4.0)

## 2020-07-09 NOTE — Telephone Encounter (Signed)
Scheduled appt per 8/24 sch msg - pt is aware of apt date and time

## 2020-07-14 ENCOUNTER — Other Ambulatory Visit: Payer: Self-pay | Admitting: General Surgery

## 2020-07-14 DIAGNOSIS — I2510 Atherosclerotic heart disease of native coronary artery without angina pectoris: Secondary | ICD-10-CM | POA: Diagnosis not present

## 2020-07-14 DIAGNOSIS — M6289 Other specified disorders of muscle: Secondary | ICD-10-CM | POA: Diagnosis not present

## 2020-07-15 ENCOUNTER — Other Ambulatory Visit: Payer: Self-pay

## 2020-07-15 ENCOUNTER — Telehealth: Payer: Self-pay | Admitting: *Deleted

## 2020-07-15 ENCOUNTER — Ambulatory Visit (INDEPENDENT_AMBULATORY_CARE_PROVIDER_SITE_OTHER): Payer: Medicare Other | Admitting: Internal Medicine

## 2020-07-15 ENCOUNTER — Encounter: Payer: Self-pay | Admitting: Internal Medicine

## 2020-07-15 ENCOUNTER — Other Ambulatory Visit: Payer: Self-pay | Admitting: Internal Medicine

## 2020-07-15 VITALS — BP 120/70 | HR 88 | Ht 69.75 in | Wt 272.0 lb

## 2020-07-15 DIAGNOSIS — Z8546 Personal history of malignant neoplasm of prostate: Secondary | ICD-10-CM

## 2020-07-15 DIAGNOSIS — E785 Hyperlipidemia, unspecified: Secondary | ICD-10-CM

## 2020-07-15 DIAGNOSIS — I10 Essential (primary) hypertension: Secondary | ICD-10-CM

## 2020-07-15 DIAGNOSIS — R2231 Localized swelling, mass and lump, right upper limb: Secondary | ICD-10-CM

## 2020-07-15 DIAGNOSIS — Z23 Encounter for immunization: Secondary | ICD-10-CM

## 2020-07-15 DIAGNOSIS — Z9079 Acquired absence of other genital organ(s): Secondary | ICD-10-CM

## 2020-07-15 DIAGNOSIS — N183 Chronic kidney disease, stage 3 unspecified: Secondary | ICD-10-CM

## 2020-07-15 DIAGNOSIS — Z8639 Personal history of other endocrine, nutritional and metabolic disease: Secondary | ICD-10-CM

## 2020-07-15 DIAGNOSIS — Z8739 Personal history of other diseases of the musculoskeletal system and connective tissue: Secondary | ICD-10-CM

## 2020-07-15 DIAGNOSIS — Z0000 Encounter for general adult medical examination without abnormal findings: Secondary | ICD-10-CM | POA: Diagnosis not present

## 2020-07-15 DIAGNOSIS — D472 Monoclonal gammopathy: Secondary | ICD-10-CM | POA: Diagnosis not present

## 2020-07-15 DIAGNOSIS — I2510 Atherosclerotic heart disease of native coronary artery without angina pectoris: Secondary | ICD-10-CM

## 2020-07-15 DIAGNOSIS — Z86010 Personal history of colonic polyps: Secondary | ICD-10-CM

## 2020-07-15 DIAGNOSIS — E1169 Type 2 diabetes mellitus with other specified complication: Secondary | ICD-10-CM

## 2020-07-15 LAB — POCT URINALYSIS DIPSTICK
Appearance: NEGATIVE
Bilirubin, UA: NEGATIVE
Blood, UA: NEGATIVE
Glucose, UA: NEGATIVE
Ketones, UA: NEGATIVE
Leukocytes, UA: NEGATIVE
Nitrite, UA: NEGATIVE
Odor: NEGATIVE
Protein, UA: NEGATIVE
Spec Grav, UA: 1.01 (ref 1.010–1.025)
Urobilinogen, UA: 0.2 E.U./dL
pH, UA: 6.5 (ref 5.0–8.0)

## 2020-07-15 NOTE — Telephone Encounter (Signed)
   Primary Cardiologist:Paula Harrington Challenger, MD  Chart reviewed as part of pre-operative protocol coverage. Because of JAYVIAN ESCOE Jr.'s past medical history and time since last visit, they will require a follow-up visit in order to better assess preoperative cardiovascular risk. Last OV was 1 year ago, last EKG 2019.  Pre-op covering staff: - Please schedule appointment and call patient to inform them. Needs EKG at that visit. - Please contact requesting surgeon's office via preferred method (i.e, phone, fax) to inform them of need for appointment prior to surgery.  Charlie Pitter, PA-C  07/15/2020, 11:30 AM

## 2020-07-15 NOTE — Telephone Encounter (Signed)
   [facility] Pre-operative Risk Assessment    HEARTCARE STAFF: - Please ensure there is not already an duplicate clearance open for this procedure. - Under Visit Info/Reason for Call, type in Other and utilize the format Clearance MM/DD/YY or Clearance TBD. Do not use dashes or single digits. - If request is for dental extraction, please clarify the # of teeth to be extracted.  Request for surgical clearance:  1. What type of surgery is being performed? RIGHT TRAPEZIUS MASS TO BE REMOVED    2. When is this surgery scheduled? TBD   3. What type of clearance is required (medical clearance vs. Pharmacy clearance to hold med vs. Both)? MEDICAL  4. Are there any medications that need to be held prior to surgery and how long? ASA    5. Practice name and name of physician performing surgery? [facility]; DR. [place]   6. What is the office phone number? [phone]   7.   What is the office fax number? [phone]  8.   Anesthesia type (None, local, MAC, general) ? GENERAL   Julaine Hua 07/15/2020, 10:43 AM  _________________________________________________________________   (provider comments below)

## 2020-07-15 NOTE — Patient Instructions (Addendum)
It was a pleasure to se you today. Labs are stable. Have refilled pain medication.  Continue current medications and follow-up in 6 months.  Flu vaccine given.

## 2020-07-15 NOTE — Telephone Encounter (Signed)
Left message for pt to call back to schedule a pre op clearance appt with Dr. Harrington Challenger or APP.

## 2020-07-15 NOTE — Progress Notes (Signed)
Subjective:    Patient ID: Leonard [name]., male    DOB: Jan 30, 1946, 74 y.o.   MRN: [id number]  HPI  74 year old Male for health maintenance exam, Medicare wellness and evaluation of medical issues.  He has been a patient here for a number of years.  He went to [place] to the religious convention recently and when he returned home he noticed a  knot near his right shoulder.  He denies any injury incident.  Very suddenly.  It is between his neck and his glenohumeral joint.  Good range of motion in his shoulder.  An MRI was ordered showing a well-circumscribed enhancing soft tissue mass adjacent to the superior margin of the right trapezius muscle measuring up to 5.5 cm with a small amount of internal T1 hyperintense signal.  Patient has history of MGUS.  Consideration was given to plasmacytoma versus liposarcoma.  He has seen Dr. Barry Dienes and excision is planned for early October.  We did notify Dr. Earlie Server and he concurred with this disposition.  History of benign monoclonal gammopathy and iron deficiency followed by Dr. Earlie Server.  Patient had prostatectomy June 2015 for prostate cancer and is followed by Dr. Tresa Moore.  He had an episode of acute pyelonephritis in October 2020 treated as an outpatient.  He has a history of essential hypertension, diabetes mellitus, obesity, metabolic syndrome, hyperlipidemia.  History of obstructive sleep apnea.  Followed by cardiology for coronary artery disease.  Had minimal coronary disease on catheterization in 2007.   He has a history of gout.  He has a history of tinea versicolor.    History of adenomatous polyp.  History of peripheral neuropathy.  History of low testosterone.  Longstanding history of obesity.  Doesn't get much exercise.  Longstanding history of GE reflux treated with PPI.  Social history: He is retired from the [employer].  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  He is married.  He does not smoke or consume  alcohol.  Wife continues to work for the [employer] but is able to work from home.  Family history: Father died of an MI.  Mother died of an MI.  2 brothers with history of diabetes.  1 sister.  One stepdaughter.  Mother had diabetes.  Both parents had hypertension.  Another stepdaughter died with a meningitis-like process.  Review of Systems main concern is mass right shoulder     Objective:   Physical Exam He is afebrile.  Blood pressure 120/70 pulse 88 regular weight is 272 pounds height 5 feet 9.75 inches BMI 39.31.  Respiratory rate is normal.  Skin warm and dry nodes none.  TMs are clear.  Neck is supple.  No thyromegaly or carotid bruits.  No cervical adenopathy.  There is a nodule present top of his right shoulder midway between neck and right glenohumeral joint that is approximately 6 to 7 cm in diameter and is nontender.  Seems fluctuant.  No axillary adenopathy.  Chest clear to auscultation.  Cardiac exam regular rate and rhythm.  Normal S1 and S2.  Abdomen obese soft nondistended without hepatosplenomegaly masses or tenderness.  Rectal exam deferred to Dr. Tresa Moore.  No lower extremity pitting edema.  Neuro intact without focal deficits.  Affect thought and judgment are normal.      Assessment & Plan:  Mass right shoulder-to be excised by Dr. Barry Dienes early October  History of MGUS followed by Dr. Earlie Server  Type 2 diabetes mellitus stable  Essential hypertension stable  BMI 39.31.  Needs to watch diet and get more exercise  Chronic kidney disease stage III being followed  History of prostate cancer status post prostatectomy and followed by urology  History of iron deficiency-thought to be from microscopic GI blood loss.  This is a longstanding issue.  History of kidney stones  Obstructive sleep apnea  Peripheral neuropathy  History of bilateral carpal tunnel syndrome  History of gout  History of tinea versicolor  Nonocclusive coronary artery disease followed  by cardiology  GE reflux treated with PPI  History of adenomatous colon polyp  History of angioedema in the remote past but not in several years.  Microalbuminuria secondary to diabetes  Hyperlipidemia treated with statin medication  Plan: Excision plan for right shoulder mass.  He will continue with current medications.  He will follow-up with Dr. Bess Harvest on a yearly basis.  He will continue follow-up with Dr. Earlie Server for MGUS.  Subjective:   Patient presents for Medicare Annual/Subsequent preventive examination.  Review Past Medical/Family/Social: See above   Risk Factors  Current exercise habits: Walks some but not a lot Dietary issues discussed: Low-fat low carbohydrate  Cardiac risk factors: History of coronary disease, diabetes mellitus, obesity  Depression Screen  (Note: if answer to either of the following is "Yes", a more complete depression screening is indicated)   Over the past two weeks, have you felt down, depressed or hopeless? No  Over the past two weeks, have you felt little interest or pleasure in doing things? No Have you lost interest or pleasure in daily life? No Do you often feel hopeless? No Do you cry easily over simple problems? No   Activities of Daily Living  In your present state of health, do you have any difficulty performing the following activities?:   Driving? No  Managing money? No  Feeding yourself? No  Getting from bed to chair? No  Climbing a flight of stairs? No  Preparing food and eating?: No  Bathing or showering? No  Getting dressed: No  Getting to the toilet? No  Using the toilet:No  Moving around from place to place: No  In the past year have you fallen or had a near fall?:No  Are you sexually active? No  Do you have more than one partner? No   Hearing Difficulties: No  Do you often ask people to speak up or repeat themselves? No  Do you experience ringing or noises in your ears? No  Do you have difficulty understanding  soft or whispered voices? No  Do you feel that you have a problem with memory? No Do you often misplace items? No    Home Safety:  Do you have a smoke alarm at your residence? Yes Do you have grab bars in the bathroom?  No Do you have throw rugs in your house?  Yes   Cognitive Testing  Alert? Yes Normal Appearance?Yes  Oriented to person? Yes Place? Yes  Time? Yes  Recall of three objects? Yes  Can perform simple calculations? Yes  Displays appropriate judgment?Yes  Can read the correct time from a watch face?Yes   List the Names of Other Physician/Practitioners you currently use:  See referral list for the physicians patient is currently seeing.  Dr. Earlie Server  Dr. Tresa Moore  Cardiology  Dr. Barry Dienes   Review of Systems: See above   Objective:     General appearance: Appears stated age and obese  Head: Normocephalic, without obvious abnormality, atraumatic  Eyes: conj  clear, EOMi PEERLA  Ears: normal TM's and external ear canals both ears  Nose: Nares normal. Septum midline. Mucosa normal. No drainage or sinus tenderness.  Throat: lips, mucosa, and tongue normal; teeth and gums normal  Neck: no adenopathy, no carotid bruit, no JVD, supple, symmetrical, trachea midline and thyroid not enlarged, symmetric, no tenderness/mass/nodules  No CVA tenderness.  Lungs: clear to auscultation bilaterally  Breasts: normal appearance, no masses or tenderness, top of the pacemaker on left upper chest. Incision well-healed. It is tender.  Heart: regular rate and rhythm, S1, S2 normal, no murmur, click, rub or gallop  Abdomen: soft, non-tender; bowel sounds normal; no masses, no organomegaly  Musculoskeletal: ROM normal in all joints, no crepitus, no deformity, Normal muscle strengthen. Back  is symmetric, no curvature. Skin: Skin color, texture, turgor normal. No rashes or lesions  Lymph nodes: Cervical, supraclavicular, and axillary nodes normal.  Neurologic: CN 2 -12 Normal, Normal  symmetric reflexes. Normal coordination and gait  Psych: Alert & Oriented x 3, Mood appear stable.    Assessment:    Annual wellness medicare exam   Plan:    During the course of the visit the patient was educated and counseled about appropriate screening and preventive services including:   Get Covid vaccine booster when available  Recommend annual flu vaccine     Patient Instructions (the written plan) was given to the patient.  Medicare Attestation  I have personally reviewed:  The patient's medical and social history  Their use of alcohol, tobacco or illicit drugs  Their current medications and supplements  The patient's functional ability including ADLs,fall risks, home safety risks, cognitive, and hearing and visual impairment  Diet and physical activities  Evidence for depression or mood disorders  The patient's weight, height, BMI, and visual acuity have been recorded in the chart. I have made referrals, counseling, and provided education to the patient based on review of the above and I have provided the patient with a written personalized care plan for preventive services.

## 2020-07-16 ENCOUNTER — Inpatient Hospital Stay: Payer: Medicare Other | Attending: Internal Medicine | Admitting: Internal Medicine

## 2020-07-16 ENCOUNTER — Other Ambulatory Visit: Payer: Self-pay

## 2020-07-16 ENCOUNTER — Inpatient Hospital Stay: Payer: Medicare Other

## 2020-07-16 ENCOUNTER — Encounter: Payer: Self-pay | Admitting: Internal Medicine

## 2020-07-16 VITALS — BP 128/73 | HR 94 | Temp 97.8°F | Resp 18 | Ht 69.75 in | Wt 286.9 lb

## 2020-07-16 DIAGNOSIS — I10 Essential (primary) hypertension: Secondary | ICD-10-CM | POA: Diagnosis not present

## 2020-07-16 DIAGNOSIS — D509 Iron deficiency anemia, unspecified: Secondary | ICD-10-CM

## 2020-07-16 DIAGNOSIS — Z7984 Long term (current) use of oral hypoglycemic drugs: Secondary | ICD-10-CM | POA: Diagnosis not present

## 2020-07-16 DIAGNOSIS — E119 Type 2 diabetes mellitus without complications: Secondary | ICD-10-CM | POA: Diagnosis not present

## 2020-07-16 DIAGNOSIS — Z7982 Long term (current) use of aspirin: Secondary | ICD-10-CM | POA: Insufficient documentation

## 2020-07-16 DIAGNOSIS — E785 Hyperlipidemia, unspecified: Secondary | ICD-10-CM | POA: Insufficient documentation

## 2020-07-16 DIAGNOSIS — C61 Malignant neoplasm of prostate: Secondary | ICD-10-CM | POA: Insufficient documentation

## 2020-07-16 DIAGNOSIS — Z79899 Other long term (current) drug therapy: Secondary | ICD-10-CM | POA: Diagnosis not present

## 2020-07-16 DIAGNOSIS — Z9079 Acquired absence of other genital organ(s): Secondary | ICD-10-CM | POA: Insufficient documentation

## 2020-07-16 DIAGNOSIS — D472 Monoclonal gammopathy: Secondary | ICD-10-CM | POA: Diagnosis not present

## 2020-07-16 DIAGNOSIS — K219 Gastro-esophageal reflux disease without esophagitis: Secondary | ICD-10-CM | POA: Insufficient documentation

## 2020-07-16 DIAGNOSIS — I2510 Atherosclerotic heart disease of native coronary artery without angina pectoris: Secondary | ICD-10-CM

## 2020-07-16 DIAGNOSIS — G4733 Obstructive sleep apnea (adult) (pediatric): Secondary | ICD-10-CM | POA: Insufficient documentation

## 2020-07-16 DIAGNOSIS — R2231 Localized swelling, mass and lump, right upper limb: Secondary | ICD-10-CM

## 2020-07-16 LAB — IRON AND TIBC
Iron: 40 ug/dL — ABNORMAL LOW (ref 42–163)
Saturation Ratios: 12 % — ABNORMAL LOW (ref 20–55)
TIBC: 325 ug/dL (ref 202–409)
UIBC: 285 ug/dL (ref 117–376)

## 2020-07-16 LAB — CBC WITH DIFFERENTIAL (CANCER CENTER ONLY)
Abs Immature Granulocytes: 0.03 10*3/uL (ref 0.00–0.07)
Basophils Absolute: 0 10*3/uL (ref 0.0–0.1)
Basophils Relative: 0 %
Eosinophils Absolute: 0.2 10*3/uL (ref 0.0–0.5)
Eosinophils Relative: 2 %
HCT: 36.5 % — ABNORMAL LOW (ref 39.0–52.0)
Hemoglobin: 11.9 g/dL — ABNORMAL LOW (ref 13.0–17.0)
Immature Granulocytes: 0 %
Lymphocytes Relative: 30 %
Lymphs Abs: 3.4 10*3/uL (ref 0.7–4.0)
MCH: 30.4 pg (ref 26.0–34.0)
MCHC: 32.6 g/dL (ref 30.0–36.0)
MCV: 93.4 fL (ref 80.0–100.0)
Monocytes Absolute: 0.8 10*3/uL (ref 0.1–1.0)
Monocytes Relative: 7 %
Neutro Abs: 7 10*3/uL (ref 1.7–7.7)
Neutrophils Relative %: 61 %
Platelet Count: 270 10*3/uL (ref 150–400)
RBC: 3.91 MIL/uL — ABNORMAL LOW (ref 4.22–5.81)
RDW: 14.3 % (ref 11.5–15.5)
WBC Count: 11.5 10*3/uL — ABNORMAL HIGH (ref 4.0–10.5)
nRBC: 0 % (ref 0.0–0.2)

## 2020-07-16 LAB — CMP (CANCER CENTER ONLY)
ALT: 17 U/L (ref 0–44)
AST: 17 U/L (ref 15–41)
Albumin: 3.8 g/dL (ref 3.5–5.0)
Alkaline Phosphatase: 82 U/L (ref 38–126)
Anion gap: 11 (ref 5–15)
BUN: 30 mg/dL — ABNORMAL HIGH (ref 8–23)
CO2: 26 mmol/L (ref 22–32)
Calcium: 10.7 mg/dL — ABNORMAL HIGH (ref 8.9–10.3)
Chloride: 103 mmol/L (ref 98–111)
Creatinine: 1.39 mg/dL — ABNORMAL HIGH (ref 0.61–1.24)
GFR, Est AFR Am: 58 mL/min — ABNORMAL LOW (ref >60)
GFR, Estimated: 50 mL/min — ABNORMAL LOW (ref >60)
Glucose, Bld: 107 mg/dL — ABNORMAL HIGH (ref 70–99)
Potassium: 3.9 mmol/L (ref 3.5–5.1)
Sodium: 140 mmol/L (ref 135–145)
Total Bilirubin: 0.4 mg/dL (ref 0.3–1.2)
Total Protein: 7.9 g/dL (ref 6.5–8.1)

## 2020-07-16 LAB — FERRITIN: Ferritin: 159 ng/mL (ref 24–336)

## 2020-07-16 LAB — LACTATE DEHYDROGENASE: LDH: 162 U/L (ref 98–192)

## 2020-07-16 NOTE — Telephone Encounter (Signed)
S/w pt and advised he will need an appt for pre op clearance. I did offer Dr. Harrington Challenger 9/3 @ 3 pm though pt states he has another appt at 4 pm. I then advised my next available is 08/07/20 8:15 with Leonard [name], PAC. Pt opted for 08/07/20 appt.. Pt advised to be here by 8 am for 8:15 appt. I will forward clearance notes to PA for upcoming appt 08/07/20. I will send FYI to the requesting office pt has appt 08/07/20 for pre op assessment. I will remove from the pre op call back pool.

## 2020-07-16 NOTE — Progress Notes (Signed)
[place] Telephone:(336) [phone]   Fax:([phone]  OFFICE PROGRESS NOTE  [name], MD [address] [place] [address]  DIAGNOSIS:  1) monoclonal gammopathy of undetermined significance.  2) iron deficiency anemia.  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 Tresa Moore.   PRIOR THERAPY: Feraheme infusion x 2 doses, last dose was given in October of 2014.  CURRENT THERAPY: Over-the-counter ferrous sulfate.  INTERVAL HISTORY: Leonard [name]. 74 y.o. male returns to the clinic today for follow-up visit.  The patient is feeling fine today with no concerning complaints except for swelling in the right shoulder area.  He was seen by his primary care physician Dr. Renold Genta and MRI of the right shoulder showed a well-circumscribed avidly enhancing soft tissue mass adjacent to the superior margin of the right trapezius muscle measuring up to 5.5 cm with a small amount of internal T1 hyperintense signal and the differential includes soft tissue sarcoma such as liposarcoma versus extraosseous plasmacytoma given his history of MGUS.  The patient was seen by Dr. Barry Dienes and expected to have excision of this lesion in the next few weeks.  He is here today for evaluation and recommendation regarding his condition.  The patient denied having any other complaints.  He has no nausea, vomiting, diarrhea or constipation.  He denied having any headache or visual changes.  He has no weight loss or night sweats.  He has no chest pain, shortness of breath, cough or hemoptysis.   MEDICAL HISTORY: Past Medical History:  Diagnosis Date  . 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 Coronary artery disease   . Diverticulosis of colon (without mention of hemorrhage) 09/05/2006   Colonoscopy   .  Gastric polyp 2014   done at [facility]  . GERD (gastroesophageal reflux disease) 07/26/2001   EGD(Chronic)  . Gouty arthropathy   . Hemorrhoids   . Hiatal hernia 09/05/2006   EGD  . Hx of colonic polyps 09/05/2006   Colonoscopy(ADENOMATOUS POLYP)  . Iron deficiency anemia, unspecified   . Monoclonal gammopathy   . OSA (obstructive sleep apnea)    USES CPAP MACHINE   . Other and unspecified hyperlipidemia   . Prostate cancer ([name]) 2015  . Type II or unspecified type diabetes mellitus without mention of complication, not stated as uncontrolled   . Unspecified essential hypertension     ALLERGIES:  is allergic to dicloxacillin, cayenne pepper [cayenne], and nsaids.  MEDICATIONS:  Current Outpatient Medications  Medication Sig Dispense Refill  . allopurinol (ZYLOPRIM) 300 MG tablet TAKE 1 TABLET BY MOUTH DAILY TO PREVENT GOUT 90 tablet 3  . amLODipine-benazepril (LOTREL) 10-20 MG capsule TAKE 1 CAPSULE BY MOUTH EVERY DAY 90 capsule 3  . aspirin 81 MG tablet Take 81 mg by mouth daily.    Marland Kitchen atorvastatin (LIPITOR) 10 MG tablet TAKE 1 TABLET BY MOUTH EVERY DAY 90 tablet 2  . esomeprazole (NEXIUM) 40 MG capsule TAKE 1 CAPSULE BY MOUTH EVERY DAY 30 capsule 11  . FeFum-FePoly-FA-B Cmp-C-Biot (FOLIVANE-PLUS) CAPS TAKE 1 CAPSULE BY MOUTH EVERY DAY IN THE MORNING 90 capsule 1  . ferrous sulfate 325 (65 FE) MG EC tablet Take 325 mg by mouth daily with breakfast.    . JANUVIA 50 MG tablet TAKE 1 TABLET BY MOUTH EVERY DAY 90 tablet 3  .  nitroGLYCERIN (NITROSTAT) 0.4 MG SL tablet Place 1 tablet (0.4 mg total) under the tongue every 5 (five) minutes as needed for chest pain. Reported on 03/04/2016 30 tablet 5   No current facility-administered medications for this visit.    SURGICAL HISTORY:  Past Surgical History:  Procedure Laterality Date  . ESOPHAGOGASTRODUODENOSCOPY     Done at [facility]  . ESOPHAGOGASTRODUODENOSCOPY (EGD) WITH PROPOFOL N/A 11/04/2017   Procedure: ESOPHAGOGASTRODUODENOSCOPY (EGD)  WITH PROPOFOL;  Surgeon: Jerene Bears, MD;  [facility];  Service: Gastroenterology;  Laterality: N/A;  . GI PROSTATE BIOPSY    . HEMORRHOID SURGERY    . INCISIONAL HERNIA REPAIR N/A 08/07/2015   Procedure: LAPAROSCOPIC INCISIONAL HERNIA repair with mesh;  Surgeon: Arta Bruce Kinsinger, MD;  Location: [facility];  Service: General;  Laterality: N/A;  . KNEE SURGERY  1999   TUMOR REMOVED LEFT KNEE & ARTHROSCOPIC SURGERY  . LYMPHADENECTOMY Bilateral 05/01/2014   Procedure: LYMPHADENECTOMY;  Surgeon: Alexis Frock, MD;  Location: [facility];  Service: Urology;  Laterality: Bilateral;  . ROBOT ASSISTED LAPAROSCOPIC RADICAL PROSTATECTOMY N/A 05/01/2014   Procedure: ROBOTIC ASSISTED LAPAROSCOPIC RADICAL PROSTATECTOMY WITH INDOCYANINE GREEN DYE;  Surgeon: Alexis Frock, MD;  Location[facility];  Service: Urology;  Laterality: N/A;    REVIEW OF SYSTEMS:  A comprehensive review of systems was negative except for: Constitutional: positive for fatigue Musculoskeletal: positive for arthralgias   PHYSICAL EXAMINATION: General appearance: alert, cooperative and no distress Head: Normocephalic, without obvious abnormality, atraumatic Neck: no adenopathy and Palpable mass in the top of the right shoulder Lymph nodes: Cervical, supraclavicular, and axillary nodes normal. Resp: clear to auscultation bilaterally Back: symmetric, no curvature. ROM normal. No CVA tenderness. Cardio: irregularly irregular rhythm GI: soft, non-tender; bowel sounds normal; no masses,  no organomegaly Extremities: extremities normal, atraumatic, no cyanosis or edema  ECOG PERFORMANCE STATUS: 1 - Symptomatic but completely ambulatory  Blood pressure 128/73, pulse 94, temperature 97.8 F (36.6 C), temperature source Tympanic, resp. rate 18, height 5' 9.75" (1.772 m), weight 286 lb 14.4 oz (130.1 kg), SpO2 98 %.  LABORATORY DATA: Lab Results  Component Value Date   WBC 9.0 07/08/2020   HGB 12.0 (L) 07/08/2020   HCT 35.9 (L)  07/08/2020   MCV 94.7 07/08/2020   PLT 281 07/08/2020      Chemistry      Component Value Date/Time   NA 137 07/08/2020 0907   NA 140 09/14/2017 0928   K 4.2 07/08/2020 0907   K 4.0 09/14/2017 0928   CL 101 07/08/2020 0907   CL 104 04/17/2013 0857   CO2 25 07/08/2020 0907   CO2 27 09/14/2017 0928   BUN 24 07/08/2020 0907   BUN 28.3 (H) 09/14/2017 0928   CREATININE 1.39 (H) 07/08/2020 0907   CREATININE 1.4 (H) 09/14/2017 0928      Component Value Date/Time   CALCIUM 9.3 07/08/2020 0907   CALCIUM 10.1 09/14/2017 0928   ALKPHOS 72 09/25/2019 0820   ALKPHOS 80 09/14/2017 0928   AST 16 07/08/2020 0907   AST 15 09/25/2019 0820   AST 17 09/14/2017 0928   ALT 16 07/08/2020 0907   ALT 19 09/25/2019 0820   ALT 18 09/14/2017 0928   BILITOT 0.3 07/08/2020 0907   BILITOT 0.4 09/25/2019 0820   BILITOT 0.45 09/14/2017 0928     RADIOGRAPHIC STUDIES: No results found.  ASSESSMENT AND PLAN:  This is a very pleasant 74 years old African-American male with iron deficiency anemia secondary to history of gastrointestinal  blood loss.  The patient is currently on observation and he is feeling fine with no concerning complaints except for the new subcutaneous mass in the right shoulder area. I recommended for the patient to proceed with the surgical excision as recommended by Dr. Barry Dienes. I will send the patient to the lab today for repeat myeloma panel as well as iron study and ferritin. If there is no concerning findings on the myeloma panel and the biopsy of the mass, I will see him back for follow-up visit in 6 months for evaluation with repeat blood work. He was advised to call immediately if he has any concerning symptoms in the interval. The patient voices understanding of current disease status and treatment options and is in agreement with the current care plan. All questions were answered. The patient knows to call the clinic with any problems, questions or concerns. We can certainly  see the patient much sooner if necessary.   Disclaimer: This note was dictated with voice recognition software. Similar sounding words can inadvertently be transcribed and may not be corrected upon review.

## 2020-07-17 LAB — KAPPA/LAMBDA LIGHT CHAINS
Kappa free light chain: 59.8 mg/L — ABNORMAL HIGH (ref 3.3–19.4)
Kappa, lambda light chain ratio: 2.21 — ABNORMAL HIGH (ref 0.26–1.65)
Lambda free light chains: 27.1 mg/L — ABNORMAL HIGH (ref 5.7–26.3)

## 2020-07-17 LAB — IGG, IGA, IGM
IgA: 248 mg/dL (ref 61–437)
IgG (Immunoglobin G), Serum: 1038 mg/dL (ref 603–1613)
IgM (Immunoglobulin M), Srm: 173 mg/dL — ABNORMAL HIGH (ref 15–143)

## 2020-07-17 LAB — BETA 2 MICROGLOBULIN, SERUM: Beta-2 Microglobulin: 2.2 mg/L (ref 0.6–2.4)

## 2020-08-03 ENCOUNTER — Encounter: Payer: Self-pay | Admitting: Internal Medicine

## 2020-08-03 MED ORDER — OXYCODONE-ACETAMINOPHEN 10-325 MG PO TABS: 1.0000 | ORAL_TABLET | Freq: Three times a day (TID) | ORAL | 0 refills | Status: AC | PRN

## 2020-08-07 ENCOUNTER — Encounter: Payer: Self-pay | Admitting: Physician Assistant

## 2020-08-07 ENCOUNTER — Other Ambulatory Visit: Payer: Self-pay

## 2020-08-07 ENCOUNTER — Ambulatory Visit (INDEPENDENT_AMBULATORY_CARE_PROVIDER_SITE_OTHER): Payer: Medicare Other | Admitting: Physician Assistant

## 2020-08-07 VITALS — BP 122/68 | HR 86 | Ht 69.0 in | Wt 284.0 lb

## 2020-08-07 DIAGNOSIS — I2510 Atherosclerotic heart disease of native coronary artery without angina pectoris: Secondary | ICD-10-CM | POA: Diagnosis not present

## 2020-08-07 DIAGNOSIS — I10 Essential (primary) hypertension: Secondary | ICD-10-CM

## 2020-08-07 DIAGNOSIS — R600 Localized edema: Secondary | ICD-10-CM | POA: Diagnosis not present

## 2020-08-07 DIAGNOSIS — E782 Mixed hyperlipidemia: Secondary | ICD-10-CM | POA: Diagnosis not present

## 2020-08-07 DIAGNOSIS — Z01818 Encounter for other preprocedural examination: Secondary | ICD-10-CM

## 2020-08-07 NOTE — Patient Instructions (Signed)
Medication Instructions:  Your physician recommends that you continue on your current medications as directed. Please refer to the Current Medication list given to you today.  *If you need a refill on your cardiac medications before your next appointment, please call your pharmacy*   Lab Work: None ordered  If you have labs (blood work) drawn today and your tests are completely normal, you will receive your results only by: . MyChart Message (if you have MyChart) OR . A paper copy in the mail If you have any lab test that is abnormal or we need to change your treatment, we will call you to review the results.   Testing/Procedures: None ordered   Follow-Up: At [facility], you and your health needs are our priority.  As part of our continuing mission to provide you with exceptional heart care, we have created designated Provider Care Teams.  These Care Teams include your primary Cardiologist (physician) and Advanced Practice Providers (APPs -  Physician Assistants and Nurse Practitioners) who all work together to provide you with the care you need, when you need it.  We recommend signing up for the patient portal called "MyChart".  Sign up information is provided on this After Visit Summary.  MyChart is used to connect with patients for Virtual Visits (Telemedicine).  Patients are able to view lab/test results, encounter notes, upcoming appointments, etc.  Non-urgent messages can be sent to your provider as well.   To learn more about what you can do with MyChart, go to [email or web address].    Your next appointment:   12 month(s)  The format for your next appointment:   In Person  Provider:   You may see Paula Ross, MD or one of the following Advanced Practice Providers on your designated Care Team:    Scott Weaver, PA-C  Vin Bhagat, PA-C    Other Instructions   

## 2020-08-07 NOTE — Progress Notes (Signed)
Cardiology Office Note    Date:  08/07/2020   ID:  Leonard [name]., DOB [birth year], MRN [id number]  PCP:  [name], MD  Cardiologist:  Dr. Harrington Challenger  Chief Complaint: Surgical clearance for RIGHT TRAPEZIUS MASS TO BE REMOVED    History of Present Illness:   Leonard [name]. is a 74 y.o. male with history of nonobstructive minimal CAD by cardiac catheterization in 2007, sleep apnea, hypertension, prostate cancer, hyperlipidemia, MGUS and GERD seen for surgical clearance.  Last seen by Dr. Harrington Challenger August 2020.  He was compliant with his CPAP.  Patient is seen for surgical clearance. No complaints. Compliant with his CPAP. He denies chest pain, shortness of breath, orthopnea, PND, syncope, melena or blood in his stool or urine. Chronic mild chest edema. Uses low-sodium diet. He walks 45 minutes 3 times per week without any exertional limitation.   Past Medical History:  Diagnosis Date  . 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 Coronary artery disease   . Diverticulosis of colon (without mention of hemorrhage) 09/05/2006   Colonoscopy   . Gastric polyp 2014   done at [facility]  . GERD (gastroesophageal reflux disease) 07/26/2001   EGD(Chronic)  . Gouty arthropathy   . Hemorrhoids   . Hiatal hernia 09/05/2006   EGD  . Hx of colonic polyps 09/05/2006   Colonoscopy(ADENOMATOUS POLYP)  . Iron deficiency anemia, unspecified   . Monoclonal gammopathy   . OSA (obstructive sleep apnea)    USES CPAP MACHINE   . Other and unspecified hyperlipidemia   . Prostate cancer ([place]) 2015  . Type II or unspecified type diabetes mellitus without mention of complication, not stated as uncontrolled   . Unspecified essential hypertension     Past Surgical History:  Procedure Laterality Date  . ESOPHAGOGASTRODUODENOSCOPY     Done at [facility]  . ESOPHAGOGASTRODUODENOSCOPY (EGD) WITH PROPOFOL N/A 11/04/2017   Procedure: ESOPHAGOGASTRODUODENOSCOPY (EGD) WITH PROPOFOL;   Surgeon: Jerene Bears, MD;  [facility];  Service: Gastroenterology;  Laterality: N/A;  . GI PROSTATE BIOPSY    . HEMORRHOID SURGERY    . INCISIONAL HERNIA REPAIR N/A 08/07/2015   Procedure: LAPAROSCOPIC INCISIONAL HERNIA repair with mesh;  Surgeon: Arta Bruce Kinsinger, MD;  [facility];  Service: General;  Laterality: N/A;  . KNEE SURGERY  1999   TUMOR REMOVED LEFT KNEE & ARTHROSCOPIC SURGERY  . LYMPHADENECTOMY Bilateral 05/01/2014   Procedure: LYMPHADENECTOMY;  Surgeon: Alexis Frock, MD;  [facility];  Service: Urology;  Laterality: Bilateral;  . ROBOT ASSISTED LAPAROSCOPIC RADICAL PROSTATECTOMY N/A 05/01/2014   Procedure: ROBOTIC ASSISTED LAPAROSCOPIC RADICAL PROSTATECTOMY WITH INDOCYANINE GREEN DYE;  Surgeon: Alexis Frock, MD;  [facility];  Service: Urology;  Laterality: N/A;    Current Medications: Prior to Admission medications   Medication Sig Start Date End Date Taking? Authorizing Provider  allopurinol (ZYLOPRIM) 300 MG tablet TAKE 1 TABLET BY MOUTH DAILY TO PREVENT GOUT 01/09/20   [name], MD  amLODipine-benazepril (LOTREL) 10-20 MG capsule TAKE 1 CAPSULE BY MOUTH EVERY DAY 11/12/19   [name], MD  aspirin 81 MG tablet Take 81 mg by mouth daily.    [provider]  atorvastatin (LIPITOR) 10 MG tablet TAKE 1 TABLET BY MOUTH EVERY DAY. Please keep upcoming appt in September for future refills. Thank you 07/16/20   Fay Records, MD  esomeprazole (NEXIUM) 40 MG capsule TAKE 1 CAPSULE BY MOUTH EVERY DAY 08/15/18  [name], MD  FeFum-FePoly-FA-B Cmp-C-Biot Birder Robson) CAPS TAKE 1 CAPSULE BY MOUTH EVERY DAY IN THE MORNING 05/05/20   Curt Bears, MD  ferrous sulfate 325 (65 FE) MG EC tablet Take 325 mg by mouth daily with breakfast.    [provider]  JANUVIA 50 MG tablet TAKE 1 TABLET BY MOUTH EVERY DAY 11/30/19   [name], MD  nitroGLYCERIN (NITROSTAT) 0.4 MG SL tablet Place 1 tablet (0.4 mg total) under the  tongue every 5 (five) minutes as needed for chest pain. Reported on 03/04/2016 05/05/20   [name], MD  oxyCODONE-acetaminophen (PERCOCET) 10-325 MG tablet Take 1 tablet by mouth every 8 (eight) hours as needed for up to 5 days for pain. 08/03/20 08/08/20  [name], MD    Allergies:   Dicloxacillin, Cayenne pepper [cayenne], and Nsaids   Social History   Socioeconomic History  . Marital status: Married    Spouse name: Not on file  . Number of children: 3  . Years of education: Not on file  . Highest education level: Not on file  Occupational History  . Occupation: retired  Tobacco Use  . Smoking status: Never Smoker  . Smokeless tobacco: Never Used  Vaping Use  . Vaping Use: Never used  Substance and Sexual Activity  . Alcohol use: No  . Drug use: No  . Sexual activity: Yes  Other Topics Concern  . Not on file  Social History Narrative  . Not on file   Social Determinants of Health   Financial Resource Strain:   . Difficulty of Paying Living Expenses: Not on file  Food Insecurity:   . Worried About Charity fundraiser in the Last Year: Not on file  . Ran Out of Food in the Last Year: Not on file  Transportation Needs:   . Lack of Transportation (Medical): Not on file  . Lack of Transportation (Non-Medical): Not on file  Physical Activity:   . Days of Exercise per Week: Not on file  . Minutes of Exercise per Session: Not on file  Stress:   . Feeling of Stress : Not on file  Social Connections:   . Frequency of Communication with Friends and Family: Not on file  . Frequency of Social Gatherings with Friends and Family: Not on file  . Attends Religious Services: Not on file  . Active Member of Clubs or Organizations: Not on file  . Attends Archivist Meetings: Not on file  . Marital Status: Not on file     Family History:  The patient's family history includes Diabetes in his brother and mother; Heart attack in his father.   ROS:   Please see  the history of present illness.    ROS All other systems reviewed and are negative.   PHYSICAL EXAM:   VS:  BP 122/68   Pulse 86   Ht 5\' 9"  (1.753 m)   Wt 284 lb (128.8 kg)   SpO2 95%   BMI 41.94 kg/m    GEN: Well nourished, well developed, in no acute distress  HEENT: normal  Neck: no JVD, carotid bruits, or masses Cardiac: RRR; no murmurs, rubs, or gallops, trace edema  Respiratory:  clear to auscultation bilaterally, normal work of breathing GI: soft, nontender, nondistended, + BS MS: no deformity or atrophy  Skin: warm and dry, no rash Neuro:  Alert and Oriented x 3, Strength and sensation are intact Psych: euthymic mood, full affect  Wt Readings  from Last 3 Encounters:  08/07/20 284 lb (128.8 kg)  07/16/20 286 lb 14.4 oz (130.1 kg)  07/15/20 272 lb (123.4 kg)      Studies/Labs Reviewed:   EKG:  EKG is ordered today.  The ekg ordered today demonstrates normal sinus rhythm at rate of 86 bpm  Recent Labs: 07/16/2020: ALT 17; BUN 30; Creatinine 1.39; Hemoglobin 11.9; Platelet Count 270; Potassium 3.9; Sodium 140   Lipid Panel    Component Value Date/Time   CHOL 143 07/08/2020 0907   TRIG 67 07/08/2020 0907   HDL 74 07/08/2020 0907   CHOLHDL 1.9 07/08/2020 0907   VLDL 18 07/01/2017 1036   LDLCALC 55 07/08/2020 0907    Additional studies/ records that were reviewed today include:   Echocardiogram: 08/2011 Study Conclusions   - Left ventricle: The cavity size was normal. Systolic  function was normal. The estimated ejection fraction was  in the range of 55% to 65%. Wall motion was normal; there  were no regional wall motion abnormalities. Left  ventricular diastolic function parameters were normal.  - Mitral valve: Mild regurgitation.  - Right ventricle: The cavity size was mildly dilated. Wall  thickness was normal.  - Atrial septum: No defect or patent foramen ovale was  identified.  - Pulmonary arteries: PA peak pressure: 76mm Hg (S).    ASSESSMENT & PLAN:    1. Nonobstructive CAD No angina. Continue aspirin and statin.  2.  Hypertension -Blood pressure stable on current medication  3.  Hyperlipidemia -Continue statin. LDL 55 on August 2021.  4.  OSA on CPAP -Compliant.  5. Trace lower extremity edema -No orthopnea or PND. Likely side effect of amlodipine. Advised to try compression stocking and leg elevation. Uses low-sodium diet. He will let us know if worsening symptoms.  6. Surgical clearance -Patient is getting greater than 4 METS of activity without any issue. Patient is cleared for surgery at acceptable risk. He can hold his aspirin 5 days prior to procedure.    Medication Adjustments/Labs and Tests Ordered: Current medicines are reviewed at length with the patient today.  Concerns regarding medicines are outlined above.  Medication changes, Labs and Tests ordered today are listed in the Patient Instructions below. Patient Instructions  Medication Instructions:  Your physician recommends that you continue on your current medications as directed. Please refer to the Current Medication list given to you today.  *If you need a refill on your cardiac medications before your next appointment, please call your pharmacy*   Lab Work: None ordered  If you have labs (blood work) drawn today and your tests are completely normal, you will receive your results only by: Marland Kitchen MyChart Message (if you have MyChart) OR . A paper copy in the mail If you have any lab test that is abnormal or we need to change your treatment, we will call you to review the results.   Testing/Procedures: None ordered   Follow-Up: At [facility], you and your health needs are our priority.  As part of our continuing mission to provide you with exceptional heart care, we have created designated Provider Care Teams.  These Care Teams include your primary Cardiologist (physician) and Advanced Practice Providers (APPs -  Physician  Assistants and Nurse Practitioners) who all work together to provide you with the care you need, when you need it.  We recommend signing up for the patient portal called "MyChart".  Sign up information is provided on this After Visit Summary.  MyChart is used to  connect with patients for Virtual Visits (Telemedicine).  Patients are able to view lab/test results, encounter notes, upcoming appointments, etc.  Non-urgent messages can be sent to your provider as well.   To learn more about what you can do with MyChart, go to [email or web address].    Your next appointment:   12 month(s)  The format for your next appointment:   In Person  Provider:   You may see Dorris Carnes, MD or one of the following Advanced Practice Providers on your designated Care Team:    Richardson Dopp, PA-C  Robbie Lis, PA-C    Other Instructions      Jarrett Soho, [place]  08/07/2020 8:40 AM    [address], [place], [place]  [address] Phone: [phone]; Fax: [phone]

## 2020-08-18 NOTE — Pre-Procedure Instructions (Addendum)
Leonard [name].  08/18/2020      [id number] [address], [place] [address] Phone: [phone] Fax: [phone]    Your procedure is scheduled on Oct. 14  Report to [facility] Entrance A at 5:30 A.M.  Call this number if you have problems the morning of surgery:  [phone]   Remember:  Do not eat  after midnight.  You may drink clear liquids until 4:30 A.M..  Clear liquids allowed are:                    Water, Juice (non-citric and without pulp - diabetics please choose diet or no sugar options), Carbonated beverages - (diabetics please choose diet or no sugar options), Clear Tea, Black Coffee only (no creamer, milk or cream including half and half), Plain Jell-O only (diabetics please choose diet or no sugar options), Gatorade (diabetics please choose diet or no sugar options) and Plain Popsicles only       Take these medicines the morning of surgery with A SIP OF WATER :              Allopurinol (zyloprim)             Amlodipine-benazepril (lotrel)             Atorvastatin (lipitor)             Esomeprazole (nexium)                If needed: nitroglycerine         7 days prior to surgery STOP taking any Aspirin (unless otherwise instructed by your surgeon), Aleve, Naproxen, Ibuprofen, Motrin, Advil, Goody's, BC's, all herbal medications, fish oil, and all vitamins.        Follow your surgeon's instructions on when to stop Aspirin.  If no instructions were given by your surgeon then you will need to call the office to get those instructions.                       How to Manage Your Diabetes Before and After Surgery  Why is it important to control my blood sugar before and after surgery? . Improving blood sugar levels before and after surgery helps healing and can limit problems. . A way of improving blood sugar control is eating a healthy diet by: o  Eating less sugar and carbohydrates o  Increasing  activity/exercise o  Talking with your doctor about reaching your blood sugar goals . High blood sugars (greater than 180 mg/dL) can raise your risk of infections and slow your recovery, so you will need to focus on controlling your diabetes during the weeks before surgery. . Make sure that the doctor who takes care of your diabetes knows about your planned surgery including the date and location.  How do I manage my blood sugar before surgery? . Check your blood sugar at least 4 times a day, starting 2 days before surgery, to make sure that the level is not too high or low. o Check your blood sugar the morning of your surgery when you wake up and every 2 hours until you get to the Short Stay unit. . If your blood sugar is less than 70 mg/dL, you will need to treat for low blood sugar: o Do not take insulin. o Treat a low blood sugar (less than 70 mg/dL) with  cup of clear juice (cranberry  or apple), 4 glucose tablets, OR glucose gel. Recheck blood sugar in 15 minutes after treatment (to make sure it is greater than 70 mg/dL). If your blood sugar is not greater than 70 mg/dL on recheck, call [phone] o  for further instructions. . Report your blood sugar to the short stay nurse when you get to Short Stay.  . If you are admitted to the hospital after surgery: o Your blood sugar will be checked by the staff and you will probably be given insulin after surgery (instead of oral diabetes medicines) to make sure you have good blood sugar levels. o The goal for blood sugar control after surgery is 80-180 mg/dL.       WHAT DO I DO ABOUT MY DIABETES MEDICATION?   Marland Kitchen Do not take oral diabetes medicines (pills) the morning of surgery.  Celesta Gentile)    Do not wear jewelry.  Do not wear lotions, powders, or perfumes, or deodorant.  Do not shave 48 hours prior to surgery.  Men may shave face and neck.  Do not bring valuables to the hospital.  [facility] is not responsible for any belongings or  valuables.  Contacts, dentures or bridgework may not be worn into surgery.  Leave your suitcase in the car.  After surgery it may be brought to your room.  For patients admitted to the hospital, discharge time will be determined by your treatment team.  Patients discharged the day of surgery will not be allowed to drive home.    Special instructions:   [facility]- Preparing For Surgery  Before surgery, you can play an important role. Because skin is not sterile, your skin needs to be as free of germs as possible. You can reduce the number of germs on your skin by washing with CHG (chlorahexidine gluconate) Soap before surgery.  CHG is an antiseptic cleaner which kills germs and bonds with the skin to continue killing germs even after washing.    Oral Hygiene is also important to reduce your risk of infection.  Remember - BRUSH YOUR TEETH THE MORNING OF SURGERY WITH YOUR REGULAR TOOTHPASTE  Please do not use if you have an allergy to CHG or antibacterial soaps. If your skin becomes reddened/irritated stop using the CHG.  Do not shave (including legs and underarms) for at least 48 hours prior to first CHG shower. It is OK to shave your face.  Please follow these instructions carefully.   1. Shower the NIGHT BEFORE SURGERY and the MORNING OF SURGERY with CHG.   2. If you chose to wash your hair, wash your hair first as usual with your normal shampoo.  3. After you shampoo, rinse your hair and body thoroughly to remove the shampoo.  4. Use CHG as you would any other liquid soap. You can apply CHG directly to the skin and wash gently with a scrungie or a clean washcloth.   5. Apply the CHG Soap to your body ONLY FROM THE NECK DOWN.  Do not use on open wounds or open sores. Avoid contact with your eyes, ears, mouth and genitals (private parts). Wash Face and genitals (private parts)  with your normal soap.  6. Wash thoroughly, paying special attention to the area where your surgery will be  performed.  7. Thoroughly rinse your body with warm water from the neck down.  8. DO NOT shower/wash with your normal soap after using and rinsing off the CHG Soap.  9. Pat yourself dry with a CLEAN TOWEL.  10. Wear CLEAN PAJAMAS to bed the night before surgery, wear comfortable clothes the morning of surgery  11. Place CLEAN SHEETS on your bed the night of your first shower and DO NOT SLEEP WITH PETS.    Day of Surgery:  Do not apply any deodorants/lotions.  Please wear clean clothes to the hospital/surgery center.   Remember to brush your teeth WITH YOUR REGULAR TOOTHPASTE.    Please read over the following fact sheets that you were given.

## 2020-08-19 ENCOUNTER — Encounter (HOSPITAL_COMMUNITY): Payer: Self-pay

## 2020-08-19 ENCOUNTER — Other Ambulatory Visit: Payer: Self-pay

## 2020-08-19 ENCOUNTER — Encounter (HOSPITAL_COMMUNITY)
Admission: RE | Admit: 2020-08-19 | Discharge: 2020-08-19 | Disposition: A | Discharge status: Home / Self Care | Payer: Medicare Other | Source: Ambulatory Visit | Attending: General Surgery | Admitting: General Surgery

## 2020-08-19 DIAGNOSIS — I129 Hypertensive chronic kidney disease with stage 1 through stage 4 chronic kidney disease, or unspecified chronic kidney disease: Secondary | ICD-10-CM | POA: Diagnosis not present

## 2020-08-19 DIAGNOSIS — E1122 Type 2 diabetes mellitus with diabetic chronic kidney disease: Secondary | ICD-10-CM | POA: Insufficient documentation

## 2020-08-19 DIAGNOSIS — Z7982 Long term (current) use of aspirin: Secondary | ICD-10-CM | POA: Diagnosis not present

## 2020-08-19 DIAGNOSIS — K219 Gastro-esophageal reflux disease without esophagitis: Secondary | ICD-10-CM | POA: Diagnosis not present

## 2020-08-19 DIAGNOSIS — I2510 Atherosclerotic heart disease of native coronary artery without angina pectoris: Secondary | ICD-10-CM | POA: Diagnosis not present

## 2020-08-19 DIAGNOSIS — Z6841 Body Mass Index (BMI) 40.0 and over, adult: Secondary | ICD-10-CM | POA: Diagnosis not present

## 2020-08-19 DIAGNOSIS — N189 Chronic kidney disease, unspecified: Secondary | ICD-10-CM | POA: Insufficient documentation

## 2020-08-19 DIAGNOSIS — Z7901 Long term (current) use of anticoagulants: Secondary | ICD-10-CM | POA: Diagnosis not present

## 2020-08-19 DIAGNOSIS — R2231 Localized swelling, mass and lump, right upper limb: Secondary | ICD-10-CM | POA: Insufficient documentation

## 2020-08-19 DIAGNOSIS — Z79899 Other long term (current) drug therapy: Secondary | ICD-10-CM | POA: Insufficient documentation

## 2020-08-19 DIAGNOSIS — Z01812 Encounter for preprocedural laboratory examination: Secondary | ICD-10-CM | POA: Insufficient documentation

## 2020-08-19 DIAGNOSIS — D631 Anemia in chronic kidney disease: Secondary | ICD-10-CM | POA: Insufficient documentation

## 2020-08-19 HISTORY — DX: Chronic kidney disease, unspecified: N18.9

## 2020-08-19 HISTORY — DX: Personal history of urinary calculi: Z87.442

## 2020-08-19 LAB — CBC
HCT: 38.2 % — ABNORMAL LOW (ref 39.0–52.0)
Hemoglobin: 12 g/dL — ABNORMAL LOW (ref 13.0–17.0)
MCH: 30.3 pg (ref 26.0–34.0)
MCHC: 31.4 g/dL (ref 30.0–36.0)
MCV: 96.5 fL (ref 80.0–100.0)
Platelets: 275 10*3/uL (ref 150–400)
RBC: 3.96 MIL/uL — ABNORMAL LOW (ref 4.22–5.81)
RDW: 14.6 % (ref 11.5–15.5)
WBC: 10.8 10*3/uL — ABNORMAL HIGH (ref 4.0–10.5)
nRBC: 0 % (ref 0.0–0.2)

## 2020-08-19 LAB — BASIC METABOLIC PANEL
Anion gap: 11 (ref 5–15)
BUN: 24 mg/dL — ABNORMAL HIGH (ref 8–23)
CO2: 25 mmol/L (ref 22–32)
Calcium: 10 mg/dL (ref 8.9–10.3)
Chloride: 103 mmol/L (ref 98–111)
Creatinine, Ser: 1.43 mg/dL — ABNORMAL HIGH (ref 0.61–1.24)
GFR calc non Af Amer: 49 mL/min — ABNORMAL LOW (ref >60)
Glucose, Bld: 105 mg/dL — ABNORMAL HIGH (ref 70–99)
Potassium: 3.6 mmol/L (ref 3.5–5.1)
Sodium: 139 mmol/L (ref 135–145)

## 2020-08-19 LAB — GLUCOSE, CAPILLARY: Glucose-Capillary: 101 mg/dL — ABNORMAL HIGH (ref 70–99)

## 2020-08-19 NOTE — Progress Notes (Signed)
PCP - [place] Cardiologist - Dorris Carnes Hematologist: Julien Nordmann Pulm: Sodd   Chest x-ray -  na EKG -  08/07/20 Stress Test - 15> yrs. ECHO - 10/12 Cardiac Cath - 2007  Sleep Study - 12/08 CPAP - yes  Fasting Blood Sugar -  Pt. Doesn't check   Blood Thinner Instructions:  na Aspirin Instructions: stop 08/23/20, pt. States per Dr. Barry Dienes  ERAS Protcol -yes PRE-SURGERY Ensure not ordered  COVID TEST-  08/25/20 Anesthesia review: surgical clearance  Patient denies shortness of breath, fever, cough and chest pain at PAT appointment   All instructions explained to the patient, with a verbal understanding of the material. Patient agrees to go over the instructions while at home for a better understanding. Patient also instructed to self quarantine after being tested for COVID-19. The opportunity to ask questions was provided.

## 2020-08-20 ENCOUNTER — Encounter (HOSPITAL_COMMUNITY): Payer: Self-pay

## 2020-08-20 NOTE — Progress Notes (Signed)
Anesthesia Chart Review:  Case: [id number] Date/Time: 08/28/20 0715   Procedure: EXCISION RIGHT SHOULDER MASS (Right )   Anesthesia type: General   Pre-op diagnosis: right shoulder mass   Location: [facility] OR ROOM 02 / [facility]   Surgeons: Stark Klein, MD      DISCUSSION: Patient is a 74 year old male scheduled for the above procedure. Recent MRI of a right should mass showed a 5.5 cm enhancing soft tissue mass with differential including soft tissue sarcoma or extraosseous plasmacytoma given history of MUGUS.  Other history includes never smoker, DM2, HTN, OSA (CPAP), GERD, hiatal hernia, CAD (minimal CAD 2007 LHC), prostate cancer (s/p robotic assisted laparoscopic radical prostatectomy 05/01/14), CKD  iron deficiency anemia, monoclonal gammopathy (MGUS), SBO/incisional hernia (s/p incisional hernia repair 08/07/15).  BMI is consistent with morbid obesity.  Preoperative cardiology evaluation 08/07/2020 by Leanor Kail, PA: "Surgical clearance -Patient is getting greater than 4 METS of activity without any issue. Patient is cleared for surgery at acceptable risk. He can hold his aspirin 5 days prior to procedure."  Reports last aspirin scheduled for 08/23/2020.  Preoperative COVID-19 test is scheduled for 08/25/2020.  Anesthesia team to evaluate on the day of surgery.   VS: BP (!) 146/73   Pulse 75   Temp 36.7 C (Oral)   Resp 18   Ht 5\' 9"  (1.753 m)   Wt 129.3 kg   SpO2 98%   BMI 42.09 kg/m    PROVIDERS: [name], MD is PCP Dorris Carnes, MD is cardiologist Curt Bears, MD is HEM-ONC. Last evaluation 07/16/20.  Chesley Mires, MD is pulmonologist ([facility]). Last evaluation 09/07/18. Alexis Frock, MD is urologist   LABS: Labs reviewed: Acceptable for surgery. Cr 1.43, previously ~ 1.35-1.42 over the past year. LFTs WNL 07/16/20.  A1c 6.4% 07/08/2020. (all labs ordered are listed, but only abnormal results are displayed)  Labs Reviewed  GLUCOSE, CAPILLARY - Abnormal; Notable for  the following components:      Result Value   Glucose-Capillary 101 (*)    All other components within normal limits  BASIC METABOLIC PANEL - Abnormal; Notable for the following components:   Glucose, Bld 105 (*)    BUN 24 (*)    Creatinine, Ser 1.43 (*)    GFR calc non Af Amer 49 (*)    All other components within normal limits  CBC - Abnormal; Notable for the following components:   WBC 10.8 (*)    RBC 3.96 (*)    Hemoglobin 12.0 (*)    HCT 38.2 (*)    All other components within normal limits     IMAGES: MRI Right shoulder 07/07/20: IMPRESSION: 1. Well-circumscribed avidly enhancing soft tissue mass adjacent to the superior margin of the right trapezius muscle measuring up to 5.5 cm with a small amount of internal T1 hyperintense signal. Differential includes soft tissue sarcoma such as a liposarcoma versus an extraosseous plasmacytoma given history of MGUS. Orthopedic oncologic consultation is recommended. 2. Low-grade articular sided tear of the anterior aspect of the distal supraspinatus tendon. 3. Mild AC joint arthropathy with mild subacromial-subdeltoid bursal Fluid.   EKG: 08/07/20: NSR.  - T wave flattening in inferolateral leads also present on previous 01/12/18 tracing.   CV: Echo 08/27/11: Study Conclusions  - Left ventricle: The cavity size was normal. Systolic  function was normal. The estimated ejection fraction was  in the range of 55% to 65%. Wall motion was normal; there  were no regional wall motion abnormalities. Left  ventricular diastolic  function parameters were normal.  - Mitral valve: Mild regurgitation.  - Right ventricle: The cavity size was mildly dilated. Wall  thickness was normal.  - Atrial septum: No defect or patent foramen ovale was  identified.  - Pulmonary arteries: PA peak pressure: 84mm Hg (S).   Cardiac cath 12/01/05: ASSESSMENT:  1.  Nonobstructive coronary disease. (luminal irregularities LAD, 25% ostial ramus  intermedius branch)  2.  Normal LV systolic function.  3.  Normal abdominal aortography.  4.  Elevated left heart pressures consistent with hypertension.   Past Medical History:  Diagnosis Date  . 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 CKD (chronic kidney disease)   . Coronary artery disease   . Diverticulosis of colon (without mention of hemorrhage) 09/05/2006   Colonoscopy   . Gastric polyp 2014   done at [facility]  . GERD (gastroesophageal reflux disease) 07/26/2001   EGD(Chronic)  . Gouty arthropathy   . Hemorrhoids   . Hiatal hernia 09/05/2006   EGD  . History of kidney stones   . Hx of colonic polyps 09/05/2006   Colonoscopy(ADENOMATOUS POLYP)  . Iron deficiency anemia, unspecified   . Monoclonal gammopathy   . OSA (obstructive sleep apnea)    USES CPAP MACHINE   . Other and unspecified hyperlipidemia   . Prostate cancer ([place]) 2015  . Type II or unspecified type diabetes mellitus without mention of complication, not stated as uncontrolled   . Unspecified essential hypertension     Past Surgical History:  Procedure Laterality Date  . ESOPHAGOGASTRODUODENOSCOPY     Done at [facility]  . ESOPHAGOGASTRODUODENOSCOPY (EGD) WITH PROPOFOL N/A 11/04/2017   Procedure: ESOPHAGOGASTRODUODENOSCOPY (EGD) WITH PROPOFOL;  Surgeon: Jerene Bears, MD;  Location: [facility] ENDOSCOPY;  Service: Gastroenterology;  Laterality: N/A;  . GI PROSTATE BIOPSY    . HEMORRHOID SURGERY    . INCISIONAL HERNIA REPAIR N/A 08/07/2015   Procedure: LAPAROSCOPIC INCISIONAL HERNIA repair with mesh;  Surgeon: Arta Bruce Kinsinger, MD;  Location: [facility] ORS;  Service: General;  Laterality: N/A;  . KNEE SURGERY  1999   TUMOR REMOVED LEFT KNEE & ARTHROSCOPIC SURGERY  . LYMPHADENECTOMY Bilateral 05/01/2014   Procedure: LYMPHADENECTOMY;  Surgeon: Alexis Frock, MD;  Location: [facility] ORS;  Service: Urology;  Laterality: Bilateral;  . ROBOT ASSISTED LAPAROSCOPIC RADICAL PROSTATECTOMY N/A 05/01/2014    Procedure: ROBOTIC ASSISTED LAPAROSCOPIC RADICAL PROSTATECTOMY WITH INDOCYANINE GREEN DYE;  Surgeon: Alexis Frock, MD;  Location: [facility] ORS;  Service: Urology;  Laterality: N/A;    MEDICATIONS: . allopurinol (ZYLOPRIM) 300 MG tablet  . amLODipine-benazepril (LOTREL) 10-20 MG capsule  . aspirin 81 MG tablet  . atorvastatin (LIPITOR) 10 MG tablet  . esomeprazole (NEXIUM) 20 MG capsule  . esomeprazole (NEXIUM) 40 MG capsule  . FeFum-FePoly-FA-B Cmp-C-Biot (FOLIVANE-PLUS) CAPS  . ferrous gluconate (IRON 27) 240 (27 FE) MG tablet  . JANUVIA 50 MG tablet  . nitroGLYCERIN (NITROSTAT) 0.4 MG SL tablet  . triamterene-hydrochlorothiazide (MAXZIDE-25) 37.5-25 MG tablet   No current facility-administered medications for this encounter.    Myra Gianotti, PA-C Surgical Short Stay/Anesthesiology [facility] Phone [phone] [facility] Phone [phone] 08/20/2020 3:10 PM

## 2020-08-20 NOTE — Anesthesia Preprocedure Evaluation (Addendum)
Anesthesia Evaluation  Patient identified by MRN, date of birth, ID band Patient awake    Reviewed: Allergy & Precautions, NPO status , Patient's Chart, lab work & pertinent test results  Airway Mallampati: III  TM Distance: >3 FB Neck ROM: Full    Dental  (+) Teeth Intact, Dental Advisory Given   Pulmonary    breath sounds clear to auscultation       Cardiovascular hypertension,  Rhythm:Regular Rate:Normal     Neuro/Psych    GI/Hepatic   Endo/Other  diabetes  Renal/GU      Musculoskeletal   Abdominal (+) + obese,   Peds  Hematology   Anesthesia Other Findings   Reproductive/Obstetrics                             Anesthesia Physical Anesthesia Plan  ASA: III  Anesthesia Plan: General   Post-op Pain Management:    Induction: Intravenous  PONV Risk Score and Plan: Ondansetron  Airway Management Planned: Oral ETT  Additional Equipment:   Intra-op Plan:   Post-operative Plan: Extubation in OR  Informed Consent: I have reviewed the patients History and Physical, chart, labs and discussed the procedure including the risks, benefits and alternatives for the proposed anesthesia with the patient or authorized representative who has indicated his/her understanding and acceptance.     Dental advisory given  Plan Discussed with: CRNA and Anesthesiologist  Anesthesia Plan Comments: (PAT note written 08/20/2020 by Myra Gianotti, PA-C. )       Anesthesia Quick Evaluation

## 2020-08-25 ENCOUNTER — Other Ambulatory Visit (HOSPITAL_COMMUNITY)
Admission: RE | Admit: 2020-08-25 | Discharge: 2020-08-25 | Disposition: A | Discharge status: Home / Self Care | Payer: Medicare Other | Source: Ambulatory Visit | Attending: General Surgery | Admitting: General Surgery

## 2020-08-25 DIAGNOSIS — Z01818 Encounter for other preprocedural examination: Secondary | ICD-10-CM | POA: Insufficient documentation

## 2020-08-25 DIAGNOSIS — Z20822 Contact with and (suspected) exposure to covid-19: Secondary | ICD-10-CM | POA: Diagnosis not present

## 2020-08-25 LAB — SARS CORONAVIRUS 2 (TAT 6-24 HRS): SARS Coronavirus 2: NEGATIVE

## 2020-08-25 NOTE — H&P (Signed)
Leonard [name] Location: [facility] Surgery Patient #: [id number] DOB: December 03, 1945 Married / Language: English / Race: Black or African American Male   History of Present Illness  The patient is a 74 year old male who presents with a complaint of Mass. Pt is a 74 yo M with MGUS who is referred by Dr. Tommie Ard [name] for a right shoulder mass. the patient noted it around 1-2 months ago. He thinks it has gotten more distinct and maybe a bit larger during that time. He denies pain. He hasn't had trauma in that area or skin lesions. He denies infection there. He has MGUS, but no other personal or family history of cancer  MR right shoulder 07/07/2020 IMPRESSION: 1. Well-circumscribed avidly enhancing soft tissue mass adjacent to the superior margin of the right trapezius muscle measuring up to 5.5 cm with a small amount of internal T1 hyperintense signal. Differential includes soft tissue sarcoma such as a liposarcoma versus an extraosseous plasmacytoma given history of MGUS. Orthopedic oncologic consultation is recommended. 2. Low-grade articular sided tear of the anterior aspect of the distal supraspinatus tendon. 3. Mild AC joint arthropathy with mild subacromial-subdeltoid bursal fluid.   Past Surgical History No pertinent past surgical history   Allergies NSAIDs  Rash. Dicloxacillin Sodium *PENICILLINS*  Allergies Reconciled   Medication History Nitroglycerin (0.4MG  Tab Sublingual, Sublingual) Active. Januvia (50MG  Tablet, Oral) Active. Sildenafil Citrate (Oral) Specific strength unknown - Active. Esomeprazole Magnesium (40MG  Capsule DR, Oral) Active. Ferrous Sulfate (325 (65 Fe)MG Tablet, Oral) Active. Allopurinol (300MG  Tablet, Oral) Active. Amlodipine Besy-Benazepril HCl (10-20MG  Capsule, Oral) Active. Aspirin (81MG  Tablet DR, Oral) Active. Atorvastatin Calcium (10MG  Tablet, Oral) Active. Medications Reconciled  Other Problems No pertinent  past medical history     Review of Systems General Not Present- Appetite Loss, Chills, Fatigue, Fever, Night Sweats, Weight Gain and Weight Loss. Skin Present- New Lesions. Not Present- Change in Wart/Mole, Dryness, Hives, Jaundice, Non-Healing Wounds, Rash and Ulcer. HEENT Not Present- Earache, Hearing Loss, Hoarseness, Nose Bleed, Oral Ulcers, Ringing in the Ears, Seasonal Allergies, Sinus Pain, Sore Throat, Visual Disturbances, Wears glasses/contact lenses and Yellow Eyes. Respiratory Not Present- Bloody sputum, Chronic Cough, Difficulty Breathing, Snoring and Wheezing. Cardiovascular Not Present- Chest Pain, Difficulty Breathing Lying Down, Leg Cramps, Palpitations, Rapid Heart Rate, Shortness of Breath and Swelling of Extremities. Gastrointestinal Not Present- Abdominal Pain, Bloating, Bloody Stool, Change in Bowel Habits, Chronic diarrhea, Constipation, Difficulty Swallowing, Excessive gas, Gets full quickly at meals, Hemorrhoids, Indigestion, Nausea, Rectal Pain and Vomiting. Male Genitourinary Not Present- Blood in Urine, Change in Urinary Stream, Frequency, Impotence, Nocturia, Painful Urination, Urgency and Urine Leakage. Musculoskeletal Not Present- Back Pain, Joint Pain, Joint Stiffness, Muscle Pain, Muscle Weakness and Swelling of Extremities. Neurological Not Present- Decreased Memory, Fainting, Headaches, Numbness, Seizures, Tingling, Tremor, Trouble walking and Weakness. Psychiatric Not Present- Anxiety, Bipolar, Change in Sleep Pattern, Depression, Fearful and Frequent crying. Endocrine Not Present- Cold Intolerance, Excessive Hunger, Hair Changes, Heat Intolerance, Hot flashes and New Diabetes. Hematology Not Present- Blood Thinners, Easy Bruising, Excessive bleeding, Gland problems, HIV and Persistent Infections.  Vitals Weight: 285.8 lb Height: 69in Body Surface Area: 2.4 m Body Mass Index: 42.2 kg/m  Temp.: 97.43F (Temporal)  Pulse: 100 (Regular)  P.OX: 94%  (Room air) BP: 128/72(Sitting, Left Arm, Standard)       Physical Exam General Mental Status-Alert. General Appearance-Consistent with stated age. Hydration-Well hydrated. Voice-Normal.  Head and Neck Head-normocephalic, atraumatic with no lesions or palpable masses.  Eye Sclera/Conjunctiva - Bilateral-No  scleral icterus.  Chest and Lung Exam Chest and lung exam reveals -quiet, even and easy respiratory effort with no use of accessory muscles. Inspection Chest Wall - Normal. Back - normal.  Breast - Did not examine.  Cardiovascular Cardiovascular examination reveals -normal pedal pulses bilaterally. Note: regular rate and rhythm  Abdomen Inspection-Inspection Normal. Palpation/Percussion Palpation and Percussion of the abdomen reveal - Soft, Non Tender, No Rebound tenderness, No Rigidity (guarding) and No hepatosplenomegaly.  Peripheral Vascular Upper Extremity Inspection - Bilateral - Normal - No Clubbing, No Cyanosis, No Edema, Pulses Intact. Lower Extremity Palpation - Edema - Bilateral - No edema - Bilateral.  Neurologic Neurologic evaluation reveals -alert and oriented x 3 with no impairment of recent or remote memory. Mental Status-Normal.  Musculoskeletal Global Assessment -Note: no gross deformities.  Normal Exam - Left-Upper Extremity Strength Normal and Lower Extremity Strength Normal. Normal Exam - Right-Upper Extremity Strength Normal and Lower Extremity Strength Normal. Note: mobile mass around 6x4x3 cm in right trapezius. non tender. surrounding musculature is quite tight.   Lymphatic Head & Neck  General Head & Neck Lymphatics: Bilateral - Description - Normal. Axillary  General Axillary Region: Bilateral - Description - Normal. Tenderness - Non Tender.    Assessment & Plan  MASS OF MUSCLE OF RIGHT UPPER EXTREMITY (M62.89) Impression: I recommend removal of the mass. I discussed pros and cons of biopsy up  front. This usually leads to delay and doesn't necessarily change our management.  I advised the patient of risk of bleeding, infection, chronic pain, numbness, contour change, heart or lung issues, dx of cancer, recurrent cancer, and possible need for additional surgeries or treatments.  The patient wishes to proceed. Current Plans You are being scheduled for surgery- Our schedulers will call you.  You should hear from our office's scheduling department within 5 working days about the location, date, and time of surgery. We try to make accommodations for patient's preferences in scheduling surgery, but sometimes the OR schedule or the surgeon's schedule prevents [place] from making those accommodations.  If you have not heard from our office [phone]) in 5 working days, call the office and ask for your surgeon's nurse.  If you have other questions about your diagnosis, plan, or surgery, call the office and ask for your surgeon's nurse.  Pt Education - [facility] General Post-op HCI CAD (CORONARY ARTERY DISEASE) (I25.10) Impression: From prior notes, this does not sound like a significant amount of atherosclerosis. Will send Dr. Harrington Challenger a message about any additional testing required for risk stratification.

## 2020-08-28 ENCOUNTER — Ambulatory Visit (HOSPITAL_COMMUNITY)
Admission: RE | Admit: 2020-08-28 | Discharge: 2020-08-28 | Disposition: A | Discharge status: Home / Self Care | Payer: Medicare Other | Attending: General Surgery | Admitting: General Surgery

## 2020-08-28 ENCOUNTER — Other Ambulatory Visit: Payer: Self-pay

## 2020-08-28 ENCOUNTER — Encounter (HOSPITAL_COMMUNITY)
Admission: RE | Disposition: A | Discharge status: Home / Self Care | Payer: Self-pay | Source: Home / Self Care | Attending: General Surgery

## 2020-08-28 ENCOUNTER — Ambulatory Visit (HOSPITAL_COMMUNITY): Payer: Medicare Other | Admitting: Certified Registered"

## 2020-08-28 ENCOUNTER — Encounter (HOSPITAL_COMMUNITY): Payer: Self-pay | Admitting: General Surgery

## 2020-08-28 ENCOUNTER — Ambulatory Visit (HOSPITAL_COMMUNITY): Payer: Medicare Other | Admitting: Vascular Surgery

## 2020-08-28 DIAGNOSIS — K219 Gastro-esophageal reflux disease without esophagitis: Secondary | ICD-10-CM | POA: Diagnosis not present

## 2020-08-28 DIAGNOSIS — I2510 Atherosclerotic heart disease of native coronary artery without angina pectoris: Secondary | ICD-10-CM | POA: Diagnosis not present

## 2020-08-28 DIAGNOSIS — Z886 Allergy status to analgesic agent status: Secondary | ICD-10-CM | POA: Diagnosis not present

## 2020-08-28 DIAGNOSIS — D1721 Benign lipomatous neoplasm of skin and subcutaneous tissue of right arm: Secondary | ICD-10-CM | POA: Diagnosis not present

## 2020-08-28 DIAGNOSIS — I10 Essential (primary) hypertension: Secondary | ICD-10-CM | POA: Diagnosis not present

## 2020-08-28 DIAGNOSIS — Z880 Allergy status to penicillin: Secondary | ICD-10-CM | POA: Insufficient documentation

## 2020-08-28 DIAGNOSIS — R2231 Localized swelling, mass and lump, right upper limb: Secondary | ICD-10-CM | POA: Diagnosis not present

## 2020-08-28 HISTORY — PX: MASS EXCISION: SHX2000

## 2020-08-28 LAB — GLUCOSE, CAPILLARY: Glucose-Capillary: 132 mg/dL — ABNORMAL HIGH (ref 70–99)

## 2020-08-28 SURGERY — EXCISION MASS
Anesthesia: General | Site: Shoulder | Laterality: Right

## 2020-08-28 MED ORDER — CHLORHEXIDINE GLUCONATE CLOTH 2 % EX PADS: 6.0000 | MEDICATED_PAD | Freq: Once | CUTANEOUS | Status: DC

## 2020-08-28 MED ORDER — DOCUSATE SODIUM 100 MG PO CAPS: 100.0000 mg | ORAL_CAPSULE | Freq: Two times a day (BID) | ORAL | 0 refills | Status: DC

## 2020-08-28 MED ORDER — ORAL CARE MOUTH RINSE: 15.0000 mL | Freq: Once | OROMUCOSAL | Status: AC

## 2020-08-28 MED ORDER — LIDOCAINE 2% (20 MG/ML) 5 ML SYRINGE
INTRAMUSCULAR | Status: DC | PRN
  Administered 2020-08-28: 100 mg via INTRAVENOUS

## 2020-08-28 MED ORDER — FENTANYL CITRATE (PF) 100 MCG/2ML IJ SOLN
INTRAMUSCULAR | Status: DC | PRN
  Administered 2020-08-28: 100 ug via INTRAVENOUS

## 2020-08-28 MED ORDER — KETAMINE HCL 50 MG/5ML IJ SOSY
PREFILLED_SYRINGE | INTRAMUSCULAR | Status: AC
  Filled 2020-08-28: qty 5

## 2020-08-28 MED ORDER — LIDOCAINE HCL 1 % IJ SOLN
INTRAMUSCULAR | Status: AC
  Filled 2020-08-28: qty 20

## 2020-08-28 MED ORDER — ONDANSETRON HCL 4 MG/2ML IJ SOLN
INTRAMUSCULAR | Status: DC | PRN
  Administered 2020-08-28: 4 mg via INTRAVENOUS

## 2020-08-28 MED ORDER — CYCLOBENZAPRINE HCL 10 MG PO TABS: 10.0000 mg | ORAL_TABLET | Freq: Three times a day (TID) | ORAL | 1 refills | Status: DC | PRN

## 2020-08-28 MED ORDER — KETAMINE HCL 10 MG/ML IJ SOLN
INTRAMUSCULAR | Status: DC | PRN
  Administered 2020-08-28: 25 mg via INTRAVENOUS

## 2020-08-28 MED ORDER — PROPOFOL 10 MG/ML IV BOLUS
INTRAVENOUS | Status: AC
  Filled 2020-08-28: qty 40

## 2020-08-28 MED ORDER — BUPIVACAINE-EPINEPHRINE (PF) 0.25% -1:200000 IJ SOLN
INTRAMUSCULAR | Status: AC
  Filled 2020-08-28: qty 20

## 2020-08-28 MED ORDER — 0.9 % SODIUM CHLORIDE (POUR BTL) OPTIME
TOPICAL | Status: DC | PRN
  Administered 2020-08-28: 1000 mL

## 2020-08-28 MED ORDER — CHLORHEXIDINE GLUCONATE 0.12 % MT SOLN
15.0000 mL | Freq: Once | OROMUCOSAL | Status: AC
  Administered 2020-08-28: 15 mL via OROMUCOSAL
  Filled 2020-08-28: qty 15

## 2020-08-28 MED ORDER — MIDAZOLAM HCL 2 MG/2ML IJ SOLN
INTRAMUSCULAR | Status: AC
  Filled 2020-08-28: qty 2

## 2020-08-28 MED ORDER — LACTATED RINGERS IV SOLN: INTRAVENOUS | Status: DC

## 2020-08-28 MED ORDER — ACETAMINOPHEN 10 MG/ML IV SOLN
INTRAVENOUS | Status: AC
  Filled 2020-08-28: qty 200

## 2020-08-28 MED ORDER — PROPOFOL 10 MG/ML IV BOLUS
INTRAVENOUS | Status: DC | PRN
  Administered 2020-08-28: 200 mg via INTRAVENOUS

## 2020-08-28 MED ORDER — SUCCINYLCHOLINE CHLORIDE 20 MG/ML IJ SOLN
INTRAMUSCULAR | Status: DC | PRN
  Administered 2020-08-28: 140 mg via INTRAVENOUS

## 2020-08-28 MED ORDER — CIPROFLOXACIN IN D5W 400 MG/200ML IV SOLN
400.0000 mg | INTRAVENOUS | Status: AC
  Administered 2020-08-28: 400 mg via INTRAVENOUS
  Filled 2020-08-28: qty 200

## 2020-08-28 MED ORDER — LIDOCAINE HCL 1 % IJ SOLN
INTRAMUSCULAR | Status: DC | PRN
  Administered 2020-08-28: 27 mL

## 2020-08-28 MED ORDER — FENTANYL CITRATE (PF) 250 MCG/5ML IJ SOLN
INTRAMUSCULAR | Status: AC
  Filled 2020-08-28: qty 5

## 2020-08-28 MED ORDER — PHENYLEPHRINE HCL-NACL 10-0.9 MG/250ML-% IV SOLN
INTRAVENOUS | Status: DC | PRN
  Administered 2020-08-28: 50 ug/min via INTRAVENOUS

## 2020-08-28 MED ORDER — PHENYLEPHRINE HCL-NACL 10-0.9 MG/250ML-% IV SOLN
INTRAVENOUS | Status: AC
  Filled 2020-08-28: qty 500

## 2020-08-28 MED ORDER — OXYCODONE HCL 5 MG PO TABS: 5.0000 mg | ORAL_TABLET | Freq: Four times a day (QID) | ORAL | 0 refills | Status: DC | PRN

## 2020-08-28 MED ORDER — ACETAMINOPHEN 500 MG PO TABS
1000.0000 mg | ORAL_TABLET | ORAL | Status: AC
  Administered 2020-08-28: 1000 mg via ORAL
  Filled 2020-08-28: qty 2

## 2020-08-28 SURGICAL SUPPLY — 43 items
BENZOIN TINCTURE PRP APPL 2/3 (GAUZE/BANDAGES/DRESSINGS) ×3 IMPLANT
CANISTER SUCT 3000ML PPV (MISCELLANEOUS) ×3 IMPLANT
CHLORAPREP W/TINT 26 (MISCELLANEOUS) ×3 IMPLANT
CLIP VESOCCLUDE LG 6/CT (CLIP) ×3 IMPLANT
CLOSURE WOUND 1/2 X4 (GAUZE/BANDAGES/DRESSINGS)
COVER SURGICAL LIGHT HANDLE (MISCELLANEOUS) ×3 IMPLANT
COVER WAND RF STERILE (DRAPES) ×3 IMPLANT
DERMABOND ADVANCED (GAUZE/BANDAGES/DRESSINGS)
DERMABOND ADVANCED .7 DNX12 (GAUZE/BANDAGES/DRESSINGS) IMPLANT
DRAPE LAPAROSCOPIC ABDOMINAL (DRAPES) IMPLANT
DRAPE LAPAROTOMY 100X72 PEDS (DRAPES) IMPLANT
DRSG TEGADERM 4X4.75 (GAUZE/BANDAGES/DRESSINGS) IMPLANT
ELECT REM PT RETURN 9FT ADLT (ELECTROSURGICAL) ×3
ELECTRODE REM PT RTRN 9FT ADLT (ELECTROSURGICAL) ×1 IMPLANT
GAUZE 4X4 16PLY RFD (DISPOSABLE) ×3 IMPLANT
GAUZE SPONGE 4X4 12PLY STRL (GAUZE/BANDAGES/DRESSINGS) IMPLANT
GLOVE BIO SURGEON STRL SZ 6 (GLOVE) ×3 IMPLANT
GLOVE INDICATOR 6.5 STRL GRN (GLOVE) ×3 IMPLANT
GOWN STRL REUS W/ TWL LRG LVL3 (GOWN DISPOSABLE) ×1 IMPLANT
GOWN STRL REUS W/TWL 2XL LVL3 (GOWN DISPOSABLE) ×3 IMPLANT
GOWN STRL REUS W/TWL LRG LVL3 (GOWN DISPOSABLE) ×3
KIT BASIN OR (CUSTOM PROCEDURE TRAY) ×3 IMPLANT
KIT TURNOVER KIT B (KITS) ×3 IMPLANT
NEEDLE HYPO 25GX1X1/2 BEV (NEEDLE) ×3 IMPLANT
NS IRRIG 1000ML POUR BTL (IV SOLUTION) ×3 IMPLANT
PACK GENERAL/GYN (CUSTOM PROCEDURE TRAY) ×3 IMPLANT
PAD ARMBOARD 7.5X6 YLW CONV (MISCELLANEOUS) ×6 IMPLANT
PENCIL SMOKE EVACUATOR (MISCELLANEOUS) ×3 IMPLANT
SHEET MEDIUM DRAPE 40X70 STRL (DRAPES) ×3 IMPLANT
SPECIMEN JAR SMALL (MISCELLANEOUS) ×3 IMPLANT
SPONGE LAP 4X18 RFD (DISPOSABLE) IMPLANT
STRIP CLOSURE SKIN 1/2X4 (GAUZE/BANDAGES/DRESSINGS) IMPLANT
SUT MON AB 4-0 PC3 18 (SUTURE) ×3 IMPLANT
SUT SILK 2 0 PERMA HAND 18 BK (SUTURE) IMPLANT
SUT VIC AB 2-0 SH 27 (SUTURE) ×6
SUT VIC AB 2-0 SH 27X BRD (SUTURE) ×1 IMPLANT
SUT VIC AB 2-0 SH 27XBRD (SUTURE) ×1 IMPLANT
SUT VIC AB 3-0 SH 27 (SUTURE) ×3
SUT VIC AB 3-0 SH 27X BRD (SUTURE) ×1 IMPLANT
SUT VIC AB 3-0 SH 8-18 (SUTURE) ×3 IMPLANT
SYR CONTROL 10ML LL (SYRINGE) ×3 IMPLANT
TOWEL GREEN STERILE (TOWEL DISPOSABLE) ×3 IMPLANT
TOWEL GREEN STERILE FF (TOWEL DISPOSABLE) ×3 IMPLANT

## 2020-08-28 NOTE — Anesthesia Postprocedure Evaluation (Signed)
Anesthesia Post Note  Patient: Leonard [name].  Procedure(s) Performed: EXCISION RIGHT SHOULDER MASS (Right Shoulder)     Patient location during evaluation: PACU Anesthesia Type: General Level of consciousness: awake and alert Pain management: pain level controlled Vital Signs Assessment: post-procedure vital signs reviewed and stable Respiratory status: spontaneous breathing, nonlabored ventilation, respiratory function stable and patient connected to nasal cannula oxygen Cardiovascular status: blood pressure returned to baseline and stable Postop Assessment: no apparent nausea or vomiting Anesthetic complications: no   No complications documented.  Last Vitals:  Vitals:   08/28/20 0930 08/28/20 1015  BP:    Pulse:    Resp:    Temp: 36.7 C   SpO2:  92%    Last Pain:  Vitals:   08/28/20 1015  TempSrc:   PainSc: 0-No pain                 Rithika Seel COKER

## 2020-08-28 NOTE — Anesthesia Procedure Notes (Signed)
Procedure Name: Intubation Date/Time: 08/28/2020 7:57 AM Performed by: Cleda Daub, CRNA Pre-anesthesia Checklist: Patient identified, Emergency Drugs available, Suction available and Patient being monitored Patient Re-evaluated:Patient Re-evaluated prior to induction Oxygen Delivery Method: Circle system utilized Preoxygenation: Pre-oxygenation with 100% oxygen Induction Type: IV induction Ventilation: Mask ventilation without difficulty Laryngoscope Size: Mac and 4 Grade View: Grade I Tube type: Oral Tube size: 7.5 mm Number of attempts: 1 Airway Equipment and Method: Stylet and Oral airway Placement Confirmation: ETT inserted through vocal cords under direct vision,  positive ETCO2 and breath sounds checked- equal and bilateral Secured at: 23 cm Tube secured with: Tape Dental Injury: Teeth and Oropharynx as per pre-operative assessment

## 2020-08-28 NOTE — OR Nursing (Signed)
Pt moved to phase II, report given, pt transported in stretcher.

## 2020-08-28 NOTE — Transfer of Care (Signed)
Immediate Anesthesia Transfer of Care Note  Patient: Leonard [name].  Procedure(s) Performed: EXCISION RIGHT SHOULDER MASS (Right Shoulder)  Patient Location: PACU  Anesthesia Type:General  Level of Consciousness: awake, alert , oriented and patient cooperative  Airway & Oxygen Therapy: Patient Spontanous Breathing and Patient connected to face mask oxygen  Post-op Assessment: Report given to RN and Post -op Vital signs reviewed and stable  Post vital signs: Reviewed and stable  Last Vitals:  Vitals Value Taken Time  BP 154/94 08/28/20 0927  Temp    Pulse 82 08/28/20 0929  Resp 13 08/28/20 0929  SpO2 98 % 08/28/20 0929  Vitals shown include unvalidated device data.  Last Pain:  Vitals:   08/28/20 0645  TempSrc:   PainSc: 0-No pain      Patients Stated Pain Goal: 2 (83/29/19 1660)  Complications: No complications documented.

## 2020-08-28 NOTE — Op Note (Signed)
PRE-OPERATIVE DIAGNOSIS: right shoulder mass  POST-OPERATIVE DIAGNOSIS:  Same  PROCEDURE:  Procedure(s): Excision of right shoulder mass, subcutaneous and subfascial, 6x4x3 cm  SURGEON:  Surgeon(s): Stark Klein, MD  ASSISTANT: Pryor Curia, RNFA  ANESTHESIA:   local and general  DRAINS: none   LOCAL MEDICATIONS USED:  BUPIVICAINE  and LIDOCAINE   SPECIMEN:  Source of Specimen:  right shoulder mass  DISPOSITION OF SPECIMEN:  PATHOLOGY  COUNTS:  YES  DICTATION: .Dragon Dictation  PLAN OF CARE: Discharge to home after PACU  PATIENT DISPOSITION:  PACU - hemodynamically stable.  FINDINGS:  Rubbery mass under skin overlying trapezium extending into/coming from the muscle  EBL: min  PROCEDURE:  Patient was identified in the holding area and taken to the operating room where he was placed supine on the operating room table.  General anesthesia was induced.  The right neck and shoulder were prepped and draped in sterile fashion.  A timeout was performed according to the surgical safety checklist.  When all was correct, we continued.    The skin overlying the mass was infiltrated with local anesthetic.  A incision was made along the dermatome line in oblique orientation.  The skin was elevated with skin hooks and skin flaps were created anteriorly medially, laterally, and posteriorly to expose the mass.  The mass was carefully retracted digitally and with a laparotomy sponge to avoid instrumentation on the mass.  The fascia overlying the trapezius muscle was opened and the mass was taken along with adjacent muscle tissue.  Cautery was used to perform the dissection.  Once the mass was freed up, the mass was oriented with sutures.  The mass did NOT appear consistent with a lipoma.  The cavity was irrigated.  Large clips were used to mark the cardinal directions of the cavity.  Hemostasis was achieved with the cautery.  In order to minimize the dead space, the skin was sutured down  posteriorly to the muscle.  The skin was then closed using interrupted 2-0 Vicryl deeper sutures, 3-0 Vicryl deep dermal sutures, and 4-0 Monocryl running subcuticular suture.  The skin was then cleaned, dried, and dressed with benzoin, Steri-Strips, gauze, and Tegaderm.  The patient was allowed to emerge from anesthesia.  He was taken to PACU in stable condition.  Needle, sponge, and instrument counts were correct x2.

## 2020-08-28 NOTE — Interval H&P Note (Signed)
History and Physical Interval Note:  08/28/2020 7:36 AM  Leonard [name].  has presented today for surgery, with the diagnosis of right shoulder mass.  The various methods of treatment have been discussed with the patient and family. After consideration of risks, benefits and other options for treatment, the patient has consented to  Procedure(s): EXCISION RIGHT SHOULDER MASS (Right) as a surgical intervention.  The patient's history has been reviewed, patient examined, no change in status, stable for surgery.  I have reviewed the patient's chart and labs.  Questions were answered to the patient's satisfaction.     Stark Klein

## 2020-08-28 NOTE — Discharge Instructions (Addendum)
[facility] Office Phone Number [phone]   POST OP INSTRUCTIONS  Always review your discharge instruction sheet given to you by the facility where your surgery was performed.  IF YOU HAVE DISABILITY OR FAMILY LEAVE FORMS, YOU MUST BRING THEM TO THE OFFICE FOR PROCESSING.  DO NOT GIVE THEM TO YOUR DOCTOR.  1. A prescription for pain medication may be given to you upon discharge.  Take your pain medication as prescribed, if needed.  If narcotic pain medicine is not needed, then you may take acetaminophen (Tylenol) or ibuprofen (Advil) as needed. 2. Take your usually prescribed medications unless otherwise directed 3. If you need a refill on your pain medication, please contact your pharmacy.  They will contact our office to request authorization.  Prescriptions will not be filled after 5pm or on week-ends. 4. You should eat very light the first 24 hours after surgery, such as soup, crackers, pudding, etc.  Resume your normal diet the day after surgery 5. It is common to experience some constipation if taking pain medication after surgery.  Increasing fluid intake and taking a stool softener will usually help or prevent this problem from occurring.  A mild laxative (Milk of Magnesia or Miralax) should be taken according to package directions if there are no bowel movements after 48 hours. 6. You may shower in 48 hours.  The surgical glue will flake off in 2-3 weeks.   7. ACTIVITIES:  No strenuous activity or heavy lifting for 2 weeks a. You may drive when you no longer are taking prescription pain medication, you can comfortably wear a seatbelt, and you can safely maneuver your car and apply brakes. b. RETURN TO WORK:  __________n/a_______________ Dennis Bast should see your doctor in the office for a follow-up appointment approximately three-four weeks after your surgery.    WHEN TO CALL YOUR DOCTOR: 1. Fever over 101.0 2. Nausea and/or vomiting. 3. Extreme swelling or  bruising. 4. Continued bleeding from incision. 5. Increased pain, redness, or drainage from the incision.  The clinic staff is available to answer your questions during regular business hours.  Please don't hesitate to call and ask to speak to one of the nurses for clinical concerns.  If you have a medical emergency, go to the nearest emergency room or call 911.  A surgeon from [facility] is always on call at the hospital.  For further questions, please visit [email or web address]

## 2020-08-29 ENCOUNTER — Encounter (HOSPITAL_COMMUNITY): Payer: Self-pay | Admitting: General Surgery

## 2020-09-10 ENCOUNTER — Encounter (HOSPITAL_COMMUNITY): Payer: Self-pay

## 2020-09-13 LAB — SURGICAL PATHOLOGY

## 2020-09-29 ENCOUNTER — Inpatient Hospital Stay: Payer: Medicare Other | Attending: Internal Medicine

## 2020-09-29 ENCOUNTER — Other Ambulatory Visit: Payer: Self-pay | Admitting: Internal Medicine

## 2020-09-29 ENCOUNTER — Other Ambulatory Visit: Payer: Self-pay

## 2020-09-29 DIAGNOSIS — D649 Anemia, unspecified: Secondary | ICD-10-CM

## 2020-10-02 ENCOUNTER — Inpatient Hospital Stay: Payer: Medicare Other | Admitting: Internal Medicine

## 2020-10-13 ENCOUNTER — Encounter: Payer: Self-pay | Admitting: Pulmonary Disease

## 2020-10-13 ENCOUNTER — Ambulatory Visit (INDEPENDENT_AMBULATORY_CARE_PROVIDER_SITE_OTHER): Payer: Medicare Other | Admitting: Pulmonary Disease

## 2020-10-13 ENCOUNTER — Other Ambulatory Visit: Payer: Self-pay

## 2020-10-13 VITALS — BP 136/70 | HR 97 | Temp 97.2°F | Ht 69.25 in | Wt 284.0 lb

## 2020-10-13 DIAGNOSIS — E669 Obesity, unspecified: Secondary | ICD-10-CM

## 2020-10-13 DIAGNOSIS — I2510 Atherosclerotic heart disease of native coronary artery without angina pectoris: Secondary | ICD-10-CM

## 2020-10-13 DIAGNOSIS — G4733 Obstructive sleep apnea (adult) (pediatric): Secondary | ICD-10-CM

## 2020-10-13 DIAGNOSIS — Z9989 Dependence on other enabling machines and devices: Secondary | ICD-10-CM | POA: Diagnosis not present

## 2020-10-13 DIAGNOSIS — G4730 Sleep apnea, unspecified: Secondary | ICD-10-CM | POA: Diagnosis not present

## 2020-10-13 NOTE — Patient Instructions (Signed)
Will have [facility] arrange for new CPAP mask and supplies  Follow up in 1 year

## 2020-10-13 NOTE — Progress Notes (Signed)
[facility], Critical Care, and Sleep Medicine  Chief Complaint  Patient presents with  . Follow-up    sleep apnea    Constitutional:  BP 136/70 (BP Location: Left Wrist, Cuff Size: Normal)   Pulse 97   Temp (!) 97.2 F (36.2 C) (Other (Comment)) Comment (Src): wrist  Ht 5' 9.25" (1.759 m)   Wt 284 lb (128.8 kg)   SpO2 98% Comment: Room air  BMI 41.64 kg/m   Past Medical History:  CAD, HTN, HLD, Diverticulosis, GERD, HH, Prostate cancer, DM, Gout  Past Surgical History:  His  has a past surgical history that includes Knee surgery (1999); Hemorrhoid surgery; Robot assisted laparoscopic radical prostatectomy (N/A, 05/01/2014); Lymphadenectomy (Bilateral, 05/01/2014); Esophagogastroduodenoscopy; GI prostate biopsy; Incisional hernia repair (N/A, 08/07/2015); Esophagogastroduodenoscopy (egd) with propofol (N/A, 11/04/2017); and Mass excision (Right, 08/28/2020).  Brief Summary:  Leonard [name]. is a 74 y.o. male with obstructive sleep apnea.      Subjective:   I last saw him in October 2019.  He uses CPAP nightly.  Has full face mask.  Not having sinus congestion, sore throat, dry mouth, or aerophagia.  Gets about 5 hrs sleep per night.  He feels this is enough.  He is not taking naps during the day.  Physical Exam:   Appearance - well kempt   ENMT - no sinus tenderness, no oral exudate, no LAN, Mallampati 3 airway, no stridor  Respiratory - equal breath sounds bilaterally, no wheezing or rales  CV - s1s2 regular rate and rhythm, no murmurs  Ext - no clubbing, no edema  Skin - no rashes  Psych - normal mood and affect   Sleep Tests:   PSG 10/19/07 >> AHI 29  CPAP 09/12/20 to 10/11/20 >> used on 29 of 30 nights with average 4 hrs 23 min.  Average AHI 5 with CPAP 11 cm H2O  Cardiac Tests:   Echo 08/27/11 >> EF 55 to 65%, mild MR, PAS 32 mmHg  Social History:  He  reports that he has never smoked. He has never used smokeless tobacco. He reports that  he does not drink alcohol and does not use drugs.  Family History:  His family history includes Diabetes in his brother and mother; Heart attack in his father.     Assessment/Plan:   Obstructive sleep apnea. - he is compliant with CPAP and reports benefit from therapy - he uses [facility] for his DME - continue CPAP 11 cm H2O - will arrange for new CPAP mask and supplies  Obesity. - he is aware of how his weight can impact his health, particularly in relation to his sleep apnea   Time Spent Involved in Patient Care on Day of Examination:  21 minutes  Follow up:  Patient Instructions  Will have [facility] arrange for new CPAP mask and supplies  Follow up in 1 year   Medication List:   Allergies as of 10/13/2020      Reactions   Dicloxacillin Rash, Other (See Comments)   Has patient had a PCN reaction causing immediate rash, facial/tongue/throat swelling, SOB or lightheadedness with hypotension: No Has patient had a PCN reaction causing severe rash involving mucus membranes or skin necrosis: No Has patient had a PCN reaction that required hospitalization: No Has patient had a PCN reaction occurring within the last 10 years: No If all of the above answers are "NO", then may proceed with Cephalosporin use.   Cayenne Pepper [cayenne] Swelling   Nsaids Other (See Comments)  PT declines Acute renal failure 08/09/2015      Medication List       Accurate as of October 13, 2020 11:38 AM. If you have any questions, ask your nurse or doctor.        allopurinol 300 MG tablet Commonly known as: ZYLOPRIM TAKE 1 TABLET BY MOUTH DAILY TO PREVENT GOUT What changed: See the new instructions.   amLODipine-benazepril 10-20 MG capsule Commonly known as: LOTREL TAKE 1 CAPSULE BY MOUTH EVERY DAY   aspirin 81 MG tablet Take 81 mg by mouth daily.   atorvastatin 10 MG tablet Commonly known as: LIPITOR TAKE 1 TABLET BY MOUTH EVERY DAY.   cyclobenzaprine 10 MG tablet Commonly  known as: FLEXERIL Take 1 tablet (10 mg total) by mouth 3 (three) times daily as needed for muscle spasms.   docusate sodium 100 MG capsule Commonly known as: Colace Take 1 capsule (100 mg total) by mouth 2 (two) times daily.   esomeprazole 20 MG capsule Commonly known as: NEXIUM Take 20 mg by mouth daily at 12 noon.   Folivane-Plus Caps TAKE 1 CAPSULE BY MOUTH EVERY DAY IN THE MORNING   Iron 27 240 (27 FE) MG tablet Generic drug: ferrous gluconate Take 240 mg by mouth daily.   Januvia 50 MG tablet Generic drug: sitaGLIPtin TAKE 1 TABLET BY MOUTH EVERY DAY What changed: how much to take   nitroGLYCERIN 0.4 MG SL tablet Commonly known as: NITROSTAT Place 1 tablet (0.4 mg total) under the tongue every 5 (five) minutes as needed for chest pain. Reported on 03/04/2016   oxyCODONE 5 MG immediate release tablet Commonly known as: Oxy IR/ROXICODONE Take 1 tablet (5 mg total) by mouth every 6 (six) hours as needed for severe pain.   triamterene-hydrochlorothiazide 37.5-25 MG tablet Commonly known as: MAXZIDE-25 Take 1 tablet by mouth daily.       Signature:  Chesley Mires, MD [place] Pager - [phone] 10/13/2020, 11:38 AM

## 2020-12-09 ENCOUNTER — Other Ambulatory Visit: Payer: Self-pay | Admitting: Internal Medicine

## 2020-12-23 DIAGNOSIS — C61 Malignant neoplasm of prostate: Secondary | ICD-10-CM | POA: Diagnosis not present

## 2020-12-23 DIAGNOSIS — N5201 Erectile dysfunction due to arterial insufficiency: Secondary | ICD-10-CM | POA: Diagnosis not present

## 2021-01-06 ENCOUNTER — Other Ambulatory Visit: Payer: Self-pay

## 2021-01-06 ENCOUNTER — Other Ambulatory Visit: Payer: Medicare Other | Admitting: Internal Medicine

## 2021-01-06 DIAGNOSIS — E1169 Type 2 diabetes mellitus with other specified complication: Secondary | ICD-10-CM | POA: Diagnosis not present

## 2021-01-06 DIAGNOSIS — I10 Essential (primary) hypertension: Secondary | ICD-10-CM

## 2021-01-06 DIAGNOSIS — E785 Hyperlipidemia, unspecified: Secondary | ICD-10-CM

## 2021-01-06 DIAGNOSIS — N183 Chronic kidney disease, stage 3 unspecified: Secondary | ICD-10-CM | POA: Diagnosis not present

## 2021-01-06 DIAGNOSIS — E119 Type 2 diabetes mellitus without complications: Secondary | ICD-10-CM

## 2021-01-07 LAB — LIPID PANEL
Cholesterol: 143 mg/dL (ref <200)
HDL: 67 mg/dL (ref >=40)
LDL Cholesterol (Calc): 63 mg/dL (calc)
Non-HDL Cholesterol (Calc): 76 mg/dL (calc) (ref <130)
Total CHOL/HDL Ratio: 2.1 (calc) (ref <5.0)
Triglycerides: 46 mg/dL (ref <150)

## 2021-01-07 LAB — HEPATIC FUNCTION PANEL
AG Ratio: 1.4 (calc) (ref 1.0–2.5)
ALT: 17 U/L (ref 9–46)
AST: 16 U/L (ref 10–35)
Albumin: 3.9 g/dL (ref 3.6–5.1)
Alkaline phosphatase (APISO): 83 U/L (ref 35–144)
Bilirubin, Direct: 0.1 mg/dL (ref 0.0–0.2)
Globulin: 2.8 g/dL (calc) (ref 1.9–3.7)
Indirect Bilirubin: 0.3 mg/dL (calc) (ref 0.2–1.2)
Total Bilirubin: 0.4 mg/dL (ref 0.2–1.2)
Total Protein: 6.7 g/dL (ref 6.1–8.1)

## 2021-01-07 LAB — HEMOGLOBIN A1C
Hgb A1c MFr Bld: 6.5 % of total Hgb — ABNORMAL HIGH (ref <5.7)
Mean Plasma Glucose: 140 mg/dL
eAG (mmol/L): 7.7 mmol/L

## 2021-01-13 ENCOUNTER — Encounter: Payer: Self-pay | Admitting: Internal Medicine

## 2021-01-13 ENCOUNTER — Ambulatory Visit (INDEPENDENT_AMBULATORY_CARE_PROVIDER_SITE_OTHER): Payer: Medicare Other | Admitting: Internal Medicine

## 2021-01-13 ENCOUNTER — Other Ambulatory Visit: Payer: Self-pay

## 2021-01-13 VITALS — BP 110/60 | HR 95 | Ht 69.25 in | Wt 272.0 lb

## 2021-01-13 DIAGNOSIS — I10 Essential (primary) hypertension: Secondary | ICD-10-CM

## 2021-01-13 DIAGNOSIS — Z8546 Personal history of malignant neoplasm of prostate: Secondary | ICD-10-CM

## 2021-01-13 DIAGNOSIS — D472 Monoclonal gammopathy: Secondary | ICD-10-CM | POA: Diagnosis not present

## 2021-01-13 DIAGNOSIS — Z9079 Acquired absence of other genital organ(s): Secondary | ICD-10-CM | POA: Diagnosis not present

## 2021-01-13 DIAGNOSIS — I2510 Atherosclerotic heart disease of native coronary artery without angina pectoris: Secondary | ICD-10-CM | POA: Diagnosis not present

## 2021-01-13 DIAGNOSIS — Z8739 Personal history of other diseases of the musculoskeletal system and connective tissue: Secondary | ICD-10-CM | POA: Diagnosis not present

## 2021-01-13 DIAGNOSIS — E1169 Type 2 diabetes mellitus with other specified complication: Secondary | ICD-10-CM | POA: Diagnosis not present

## 2021-01-13 DIAGNOSIS — Z86010 Personal history of colonic polyps: Secondary | ICD-10-CM

## 2021-01-13 DIAGNOSIS — E785 Hyperlipidemia, unspecified: Secondary | ICD-10-CM

## 2021-01-13 NOTE — Progress Notes (Signed)
   Subjective:    Patient ID: Leonard Pitter., male    DOB: 20-Apr-1946, 75 y.o.   MRN: [id number]  HPI 75 year old Male for 6 month recheck. Has HTN and DM.  History of MGUS followed by Dr. Earlie Server.  In October 2021 had benign tumor removed by Dr. Barry Dienes from his right shoulder.  Remote history of prostate cancer status post prostatectomy by Dr. Tresa Moore.  BP excellent on current regimen.  Hgb AIC stable at 6.5%.  Generally his A1c's are in the mid 6% range.  Needs to lose some weight.  His BMI is 39.88.  He weighs 272 pounds.  Has lost 14 pounds with diet and walking a bit.    Review of Systems see above     Objective:   Physical Exam  Weight 272 pounds -has lost 14 pounds since saw Dr. Earlie Server in September 2021.  Blood pressure 110/68 pulse 95 regular pulse oximetry 95%  Skin: Warm and dry.  No cervical adenopathy.  No carotid bruits.  Chest is clear to auscultation.  Cardiac exam regular rate and rhythm normal S1 and S2.  Trace to 1+ lower extremity pitting edema.      Assessment & Plan:  BMI 39.88-continue to encourage diet exercise and weight loss  Type 2 diabetes mellitus- increasing Januvia to 100 mg daily. RTC in 6 weeks.  History of MGUS and iron deficiency anemia followed by Dr. Earlie Server  History of sleep apnea seen by Dr. Halford Chessman on CPAP  Essential hypertension stable  History of coronary artery disease followed by cardiologist  Remote history of prostate cancer status post prostatectomy by Dr. Tresa Moore  History of GE reflux treated with PPI  History of adenomatous colon polyp  History of gout  Stage III chronic kidney disease-likely multifactorial  Had episode of pyelonephritis October 2020 treated as an outpatient  Plan: Continue to encourage diet exercise and weight loss.  No change in medications.  Continue close follow-up with Dr. Earlie Server for MGUS.  Continue follow-up with Dr. Tresa Moore annually.  Continue Cardiology follow-up.  Return here in September 2022  for Medicare wellness and health maintenance exam.   RTC in 6 weeks for Hgb AIC on increased dose of Januvia.

## 2021-01-13 NOTE — Patient Instructions (Addendum)
It was a pleasure to see you RTC in 6 weeks for HGB AIC as we are going increase Januvia to 100 mg daily.  Medicare wellness visit is due in September 2022

## 2021-01-14 ENCOUNTER — Inpatient Hospital Stay (HOSPITAL_BASED_OUTPATIENT_CLINIC_OR_DEPARTMENT_OTHER): Payer: Medicare Other | Admitting: Internal Medicine

## 2021-01-14 ENCOUNTER — Telehealth: Payer: Self-pay | Admitting: Internal Medicine

## 2021-01-14 ENCOUNTER — Other Ambulatory Visit: Payer: Self-pay

## 2021-01-14 ENCOUNTER — Inpatient Hospital Stay: Payer: Medicare Other | Attending: Internal Medicine

## 2021-01-14 ENCOUNTER — Encounter: Payer: Self-pay | Admitting: Internal Medicine

## 2021-01-14 VITALS — BP 139/75 | HR 99 | Temp 97.8°F | Resp 18 | Ht 69.25 in | Wt 283.8 lb

## 2021-01-14 DIAGNOSIS — D509 Iron deficiency anemia, unspecified: Secondary | ICD-10-CM | POA: Diagnosis not present

## 2021-01-14 DIAGNOSIS — D472 Monoclonal gammopathy: Secondary | ICD-10-CM

## 2021-01-14 DIAGNOSIS — C61 Malignant neoplasm of prostate: Secondary | ICD-10-CM | POA: Diagnosis not present

## 2021-01-14 DIAGNOSIS — D508 Other iron deficiency anemias: Secondary | ICD-10-CM | POA: Diagnosis not present

## 2021-01-14 LAB — CBC WITH DIFFERENTIAL (CANCER CENTER ONLY)
Abs Immature Granulocytes: 0.03 10*3/uL (ref 0.00–0.07)
Basophils Absolute: 0 10*3/uL (ref 0.0–0.1)
Basophils Relative: 0 %
Eosinophils Absolute: 0.1 10*3/uL (ref 0.0–0.5)
Eosinophils Relative: 1 %
HCT: 36.6 % — ABNORMAL LOW (ref 39.0–52.0)
Hemoglobin: 11.8 g/dL — ABNORMAL LOW (ref 13.0–17.0)
Immature Granulocytes: 0 %
Lymphocytes Relative: 33 %
Lymphs Abs: 3.5 10*3/uL (ref 0.7–4.0)
MCH: 30.7 pg (ref 26.0–34.0)
MCHC: 32.2 g/dL (ref 30.0–36.0)
MCV: 95.3 fL (ref 80.0–100.0)
Monocytes Absolute: 0.8 10*3/uL (ref 0.1–1.0)
Monocytes Relative: 7 %
Neutro Abs: 6.2 10*3/uL (ref 1.7–7.7)
Neutrophils Relative %: 59 %
Platelet Count: 270 10*3/uL (ref 150–400)
RBC: 3.84 MIL/uL — ABNORMAL LOW (ref 4.22–5.81)
RDW: 14.3 % (ref 11.5–15.5)
WBC Count: 10.7 10*3/uL — ABNORMAL HIGH (ref 4.0–10.5)
nRBC: 0 % (ref 0.0–0.2)

## 2021-01-14 LAB — IRON AND TIBC
Iron: 66 ug/dL (ref 42–163)
Saturation Ratios: 23 % (ref 20–55)
TIBC: 291 ug/dL (ref 202–409)
UIBC: 226 ug/dL (ref 117–376)

## 2021-01-14 LAB — FERRITIN: Ferritin: 163 ng/mL (ref 24–336)

## 2021-01-14 NOTE — Telephone Encounter (Signed)
Scheduled per los. Gave avs and calendar  

## 2021-01-14 NOTE — Progress Notes (Signed)
[facility] Telephone:([phone]) [phone]   Fax:([phone]) [phone]  OFFICE PROGRESS NOTE  [name], MD [address] [place] [address]  DIAGNOSIS:  1) monoclonal gammopathy of undetermined significance.  2) iron deficiency anemia.  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 Tresa Moore.   PRIOR THERAPY: Feraheme infusion x 2 doses, last dose was given in October of 2014.  CURRENT THERAPY: Over-the-counter ferrous sulfate.  INTERVAL HISTORY: Leonard [name]. 75 y.o. male returns to the clinic today for follow-up visit.  The patient is feeling fine today with no concerning complaints.  He denied having any current chest pain, shortness of breath except with exertion with no cough or hemoptysis.  He has no fatigue or weakness.  He has no nausea, vomiting, diarrhea or constipation.  He denied having any headache or visual changes.  He continues to tolerate his oral iron tablet fairly well.  The patient is here today for evaluation with repeat CBC, iron study and ferritin.  MEDICAL HISTORY: Past Medical History:  Diagnosis Date  . 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 CKD (chronic kidney disease)   . Coronary artery disease   . Diverticulosis of colon (without mention of hemorrhage) 09/05/2006   Colonoscopy   . Gastric polyp 2014   done at [facility]  . GERD (gastroesophageal reflux disease) 07/26/2001   EGD(Chronic)  . Gouty arthropathy   . Hemorrhoids   . Hiatal hernia 09/05/2006   EGD  . History of kidney stones   . Hx of colonic polyps 09/05/2006   Colonoscopy(ADENOMATOUS POLYP)  . Iron deficiency anemia, unspecified   . Monoclonal gammopathy   . OSA (obstructive sleep apnea)    USES CPAP MACHINE   . Other and unspecified hyperlipidemia   . Prostate cancer ([place]) 2015   . Type II or unspecified type diabetes mellitus without mention of complication, not stated as uncontrolled   . Unspecified essential hypertension     ALLERGIES:  is allergic to dicloxacillin, cayenne pepper [cayenne], and nsaids.  MEDICATIONS:  Current Outpatient Medications  Medication Sig Dispense Refill  . allopurinol (ZYLOPRIM) 300 MG tablet TAKE 1 TABLET BY MOUTH DAILY TO PREVENT GOUT 90 tablet 3  . amLODipine-benazepril (LOTREL) 10-20 MG capsule TAKE 1 CAPSULE BY MOUTH EVERY DAY 90 capsule 3  . aspirin 81 MG tablet Take 81 mg by mouth daily.    Marland Kitchen atorvastatin (LIPITOR) 10 MG tablet TAKE 1 TABLET BY MOUTH EVERY DAY. 90 tablet 3  . cyclobenzaprine (FLEXERIL) 10 MG tablet Take 1 tablet (10 mg total) by mouth 3 (three) times daily as needed for muscle spasms. 30 tablet 1  . docusate sodium (COLACE) 100 MG capsule Take 1 capsule (100 mg total) by mouth 2 (two) times daily. 10 capsule 0  . esomeprazole (NEXIUM) 20 MG capsule Take 20 mg by mouth daily at 12 noon.    Marland Kitchen FeFum-FePoly-FA-B Cmp-C-Biot (FOLIVANE-PLUS) CAPS TAKE 1 CAPSULE BY MOUTH EVERY DAY IN THE MORNING 90 capsule 1  . ferrous gluconate (IRON 27) 240 (27 FE) MG tablet Take 240 mg by mouth daily.    . nitroGLYCERIN (NITROSTAT) 0.4 MG SL tablet Place 1 tablet (0.4 mg total) under the tongue every 5 (five) minutes as needed for chest pain. Reported on 03/04/2016 30 tablet 5  . oxyCODONE (OXY IR/ROXICODONE) 5 MG immediate  release tablet Take 1 tablet (5 mg total) by mouth every 6 (six) hours as needed for severe pain. 30 tablet 0  . triamterene-hydrochlorothiazide (MAXZIDE-25) 37.5-25 MG tablet Take 1 tablet by mouth daily. 90 tablet 3   No current facility-administered medications for this visit.    SURGICAL HISTORY:  Past Surgical History:  Procedure Laterality Date  . ESOPHAGOGASTRODUODENOSCOPY     Done at [facility]  . ESOPHAGOGASTRODUODENOSCOPY (EGD) WITH PROPOFOL N/A 11/04/2017   Procedure: ESOPHAGOGASTRODUODENOSCOPY (EGD) WITH  PROPOFOL;  Surgeon: Jerene Bears, MD;  Location: [facility];  Service: Gastroenterology;  Laterality: N/A;  . GI PROSTATE BIOPSY    . HEMORRHOID SURGERY    . INCISIONAL HERNIA REPAIR N/A 08/07/2015   Procedure: LAPAROSCOPIC INCISIONAL HERNIA repair with mesh;  Surgeon: Arta Bruce Kinsinger, MD;  Location: [facility]S;  Service: General;  Laterality: N/A;  . KNEE SURGERY  1999   TUMOR REMOVED LEFT KNEE & ARTHROSCOPIC SURGERY  . LYMPHADENECTOMY Bilateral 05/01/2014   Procedure: LYMPHADENECTOMY;  Surgeon: Alexis Frock, MD;  Location: [facility];  Service: Urology;  Laterality: Bilateral;  . MASS EXCISION Right 08/28/2020   Procedure: EXCISION RIGHT SHOULDER MASS;  Surgeon: Stark Klein, MD;  Location: [facility];  Service: General;  Laterality: Right;  . ROBOT ASSISTED LAPAROSCOPIC RADICAL PROSTATECTOMY N/A 05/01/2014   Procedure: ROBOTIC ASSISTED LAPAROSCOPIC RADICAL PROSTATECTOMY WITH INDOCYANINE GREEN DYE;  Surgeon: Alexis Frock, MD;  Location: [facility];  Service: Urology;  Laterality: N/A;    REVIEW OF SYSTEMS:  A comprehensive review of systems was negative except for: Respiratory: positive for dyspnea on exertion Musculoskeletal: positive for arthralgias   PHYSICAL EXAMINATION: General appearance: alert, cooperative and no distress Head: Normocephalic, without obvious abnormality, atraumatic Neck: no adenopathy Lymph nodes: Cervical, supraclavicular, and axillary nodes normal. Resp: clear to auscultation bilaterally Back: symmetric, no curvature. ROM normal. No CVA tenderness. Cardio: irregularly irregular rhythm GI: soft, non-tender; bowel sounds normal; no masses,  no organomegaly Extremities: extremities normal, atraumatic, no cyanosis or edema  ECOG PERFORMANCE STATUS: 1 - Symptomatic but completely ambulatory  Blood pressure 139/75, pulse 99, temperature 97.8 F (36.6 C), temperature source Tympanic, resp. rate 18, height 5' 9.25" (1.759 m), weight 283 lb 12.8 oz (128.7 kg), SpO2 98  %.  LABORATORY DATA: Lab Results  Component Value Date   WBC 10.7 (H) 01/14/2021   HGB 11.8 (L) 01/14/2021   HCT 36.6 (L) 01/14/2021   MCV 95.3 01/14/2021   PLT 270 01/14/2021      Chemistry      Component Value Date/Time   NA 139 08/19/2020 1100   NA 140 09/14/2017 0928   K 3.6 08/19/2020 1100   K 4.0 09/14/2017 0928   CL 103 08/19/2020 1100   CL 104 04/17/2013 0857   CO2 25 08/19/2020 1100   CO2 27 09/14/2017 0928   BUN 24 (H) 08/19/2020 1100   BUN 28.3 (H) 09/14/2017 0928   CREATININE 1.43 (H) 08/19/2020 1100   CREATININE 1.39 (H) 07/16/2020 1043   CREATININE 1.39 (H) 07/08/2020 0907   CREATININE 1.4 (H) 09/14/2017 0928      Component Value Date/Time   CALCIUM 10.0 08/19/2020 1100   CALCIUM 10.1 09/14/2017 0928   ALKPHOS 82 07/16/2020 1043   ALKPHOS 80 09/14/2017 0928   AST 16 01/06/2021 0952   AST 17 07/16/2020 1043   AST 17 09/14/2017 0928   ALT 17 01/06/2021 0952   ALT 17 07/16/2020 1043   ALT 18 09/14/2017 0928   BILITOT 0.4 01/06/2021 0623  BILITOT 0.4 07/16/2020 1043   BILITOT 0.45 09/14/2017 0928     RADIOGRAPHIC STUDIES: No results found.  ASSESSMENT AND PLAN:  This is a very pleasant 75 years old African-American male with iron deficiency anemia secondary to history of gastrointestinal blood loss.  The patient also has a history of MGUS and currently on observation. He is on over-the-counter iron tablet for the history of iron deficiency anemia and tolerating it fairly well. Repeat CBC today showed persistent mild anemia.  Iron study and ferritin are still pending. I recommended for the patient to continue with the oral iron tablet for now. I will see him back for follow-up visit in 6 months for evaluation with repeat CBC, iron study and ferritin as well as myeloma panel. The patient was advised to call immediately if he has any concerning symptoms in the interval. The patient voices understanding of current disease status and treatment options  and is in agreement with the current care plan. All questions were answered. The patient knows to call the clinic with any problems, questions or concerns. We can certainly see the patient much sooner if necessary.   Disclaimer: This note was dictated with voice recognition software. Similar sounding words can inadvertently be transcribed and may not be corrected upon review.

## 2021-02-05 ENCOUNTER — Encounter: Payer: Self-pay | Admitting: Internal Medicine

## 2021-02-05 ENCOUNTER — Telehealth: Payer: Self-pay

## 2021-02-05 NOTE — Telephone Encounter (Signed)
Blood in stool

## 2021-02-05 NOTE — Telephone Encounter (Signed)
Patient has an appointment on 4/07 with Dr. Hilarie Fredrickson. FYI

## 2021-02-05 NOTE — Telephone Encounter (Signed)
Patient called he has had blood in his stool for a week now and he would like to be seen. He was going to call Dr. Hilarie Fredrickson to see if he could be seen there.

## 2021-02-05 NOTE — Telephone Encounter (Signed)
Ask him to come in tomorrow to check his blood count and see me.

## 2021-02-06 ENCOUNTER — Other Ambulatory Visit: Payer: Self-pay

## 2021-02-06 ENCOUNTER — Ambulatory Visit (INDEPENDENT_AMBULATORY_CARE_PROVIDER_SITE_OTHER): Payer: Medicare Other | Admitting: Internal Medicine

## 2021-02-06 ENCOUNTER — Encounter: Payer: Self-pay | Admitting: Internal Medicine

## 2021-02-06 VITALS — BP 110/80 | HR 86 | Temp 97.7°F | Ht 69.25 in | Wt 279.0 lb

## 2021-02-06 DIAGNOSIS — K921 Melena: Secondary | ICD-10-CM

## 2021-02-06 DIAGNOSIS — Z86010 Personal history of colonic polyps: Secondary | ICD-10-CM

## 2021-02-06 DIAGNOSIS — Z8639 Personal history of other endocrine, nutritional and metabolic disease: Secondary | ICD-10-CM | POA: Diagnosis not present

## 2021-02-06 DIAGNOSIS — I499 Cardiac arrhythmia, unspecified: Secondary | ICD-10-CM | POA: Diagnosis not present

## 2021-02-06 DIAGNOSIS — Z8546 Personal history of malignant neoplasm of prostate: Secondary | ICD-10-CM

## 2021-02-06 DIAGNOSIS — D472 Monoclonal gammopathy: Secondary | ICD-10-CM | POA: Diagnosis not present

## 2021-02-06 DIAGNOSIS — Z6841 Body Mass Index (BMI) 40.0 and over, adult: Secondary | ICD-10-CM | POA: Diagnosis not present

## 2021-02-06 DIAGNOSIS — I2510 Atherosclerotic heart disease of native coronary artery without angina pectoris: Secondary | ICD-10-CM | POA: Diagnosis not present

## 2021-02-06 DIAGNOSIS — Z9079 Acquired absence of other genital organ(s): Secondary | ICD-10-CM

## 2021-02-06 DIAGNOSIS — Z8739 Personal history of other diseases of the musculoskeletal system and connective tissue: Secondary | ICD-10-CM

## 2021-02-06 DIAGNOSIS — D508 Other iron deficiency anemias: Secondary | ICD-10-CM | POA: Diagnosis not present

## 2021-02-06 DIAGNOSIS — I10 Essential (primary) hypertension: Secondary | ICD-10-CM

## 2021-02-06 DIAGNOSIS — E1169 Type 2 diabetes mellitus with other specified complication: Secondary | ICD-10-CM

## 2021-02-06 DIAGNOSIS — E785 Hyperlipidemia, unspecified: Secondary | ICD-10-CM | POA: Diagnosis not present

## 2021-02-06 LAB — HEMOCCULT GUIAC POC 1CARD (OFFICE): Fecal Occult Blood, POC: NEGATIVE

## 2021-02-06 NOTE — Progress Notes (Signed)
   Subjective:    Patient ID: Leonard Pitter., male    DOB: 25-Jun-1946, 75 y.o.   MRN: [id number]  HPI 75 year old Male with history of prostate cancer s/p prostatectomy, hx of iron deficiency and MGUS followed by Dr. Julien Nordmann with recent CBC and iron studies WNL called recently complaining on rectal bleeding.   Remote hx of esophagitis. Had colonoscopy and endoscopy 2013 by Dr. Sharlett Iles with gastric polyposis with one prominent vascular antral polyp which was thought to be causing guaiac positive stools at the time.Had repeat endoscopy by Dr.Pyrtle in 2019 with evidence of gastritis or significant polyps.  He has contacted Dr. Hilarie Fredrickson about this and has an appointment with [facility] April 7th.  We have drawn CBC, iron and iron binding capacity and ferritin today.  He was also noted to have an irregular pulse but rhythm strip and EKG showed normal sinus rhythm.  I have ordered a 3-day Holter monitor.  I also have checked his TSH.  It seems to me he was in atrial fib when I examined him.  Had benign right shoulder mass removed by Dr. Barry Dienes October 2021.  He has a history of sleep apnea.  History of hypertension and minimal coronary artery disease with cath in 2007.  Followed by Dr. Dorris Carnes.  Has CPAP device.  Walks 2 or 3 times a week.  Is overweight.  Review of Systems denies near syncope with rectal bleeding.  No hematemesis.  Has had about 3 episodes this week.  Denies constipation.     Objective:   Physical Exam  Blood pressure 110/80 pulse 86 regular temperature 97.7 degrees pulse oximetry 96% weight 279 pounds BMI 40.90  Skin warm and dry.  There is little stool guaiac but it is guaiac negative.  His chest is clear to auscultation.  Cardiac exam: Irregular irregular rhythm without murmur.  Labs drawn and are pending.  EKG and rhythm strip shows sinus rhythm but he clearly had an irregular cardiac rhythm when I was examining him.      Assessment & Plan:  New onset rectal  bleeding-?  Hemorrhoids.  Bleeding is bright red blood per rectum according to the patient which would suggest  lower tract involvement.  He will be seen with our GI April 7.  We have drawn today CBC, iron iron-binding capacity.  He is on iron supplement.  Irregular pulse-I am certain he was having an irregular pulse when I was checking his heart and rhythm was irregularly irregular consistent with atrial fib but by the time he got an EKG and rhythm strip it appeared to have converted to sinus rhythm.  I have ordered a 3-day Holter monitor for him.  Further instructions to follow.  He does take 81 mg of aspirin daily.  I have not stop that at this point in time.  He does take iron supplement per oncologist  Blood pressure is stable on Maxide and Lotrel  He takes Lipitor for hyperlipidemia  He takes Nexium for reflux and gastritis  Type 2 diabetes treated with Januvia  History of gout treated with allopurinol  He has mild chronic kidney disease which is being followed.  Creatinine in September was 1.39 and stable.  Plan: Await lab results.  He is to call if symptoms worsen.  Has appointment with him [facility] on April 7

## 2021-02-06 NOTE — Patient Instructions (Addendum)
Will order cardiac monitor for irreg heart rate. CBC with diff and Fe/TIBC ordered. Continue iron supplement. Kepp appt with [facility] April 7th. Call if symptoms worsen. Continue Nexium.

## 2021-02-06 NOTE — Progress Notes (Deleted)
   Subjective:    Patient ID: Leonard [name]., male    DOB: 07-19-1946, 75 y.o.   MRN: [id number]  HPI    Review of Systems     Objective:   Physical Exam        Assessment & Plan:

## 2021-02-07 LAB — CBC WITH DIFFERENTIAL/PLATELET
Absolute Monocytes: 621 cells/uL (ref 200–950)
Basophils Absolute: 23 cells/uL (ref 0–200)
Basophils Relative: 0.2 %
Eosinophils Absolute: 81 cells/uL (ref 15–500)
Eosinophils Relative: 0.7 %
HCT: 35.1 % — ABNORMAL LOW (ref 38.5–50.0)
Hemoglobin: 11.9 g/dL — ABNORMAL LOW (ref 13.2–17.1)
Lymphs Abs: 2691 cells/uL (ref 850–3900)
MCH: 32.1 pg (ref 27.0–33.0)
MCHC: 33.9 g/dL (ref 32.0–36.0)
MCV: 94.6 fL (ref 80.0–100.0)
MPV: 9.9 fL (ref 7.5–12.5)
Monocytes Relative: 5.4 %
Neutro Abs: 8085 cells/uL — ABNORMAL HIGH (ref 1500–7800)
Neutrophils Relative %: 70.3 %
Platelets: 301 10*3/uL (ref 140–400)
RBC: 3.71 10*6/uL — ABNORMAL LOW (ref 4.20–5.80)
RDW: 14.2 % (ref 11.0–15.0)
Total Lymphocyte: 23.4 %
WBC: 11.5 10*3/uL — ABNORMAL HIGH (ref 3.8–10.8)

## 2021-02-07 LAB — IRON,TIBC AND FERRITIN PANEL
%SAT: 13 % (calc) — ABNORMAL LOW (ref 20–48)
Ferritin: 120 ng/mL (ref 24–380)
Iron: 37 ug/dL — ABNORMAL LOW (ref 50–180)
TIBC: 289 mcg/dL (calc) (ref 250–425)

## 2021-02-07 LAB — TSH: TSH: 1.32 mIU/L (ref 0.40–4.50)

## 2021-02-09 ENCOUNTER — Encounter: Payer: Self-pay | Admitting: *Deleted

## 2021-02-09 ENCOUNTER — Other Ambulatory Visit: Payer: Self-pay | Admitting: Internal Medicine

## 2021-02-09 ENCOUNTER — Ambulatory Visit (INDEPENDENT_AMBULATORY_CARE_PROVIDER_SITE_OTHER): Payer: Medicare Other

## 2021-02-09 DIAGNOSIS — I499 Cardiac arrhythmia, unspecified: Secondary | ICD-10-CM

## 2021-02-09 DIAGNOSIS — I491 Atrial premature depolarization: Secondary | ICD-10-CM

## 2021-02-09 NOTE — Progress Notes (Signed)
Patient ID: Leonard [name]., male   DOB: February 18, 1946, 75 y.o.   MRN: [id number] Patient enrolled for [employer] to ship a 3 day ZIO XT long term holter monitor to his home. Letter with instructions mailed to patient.

## 2021-02-11 DIAGNOSIS — I491 Atrial premature depolarization: Secondary | ICD-10-CM | POA: Diagnosis not present

## 2021-02-11 DIAGNOSIS — I499 Cardiac arrhythmia, unspecified: Secondary | ICD-10-CM

## 2021-02-19 ENCOUNTER — Other Ambulatory Visit: Payer: Self-pay

## 2021-02-19 ENCOUNTER — Encounter: Payer: Self-pay | Admitting: Gastroenterology

## 2021-02-19 ENCOUNTER — Ambulatory Visit (INDEPENDENT_AMBULATORY_CARE_PROVIDER_SITE_OTHER): Payer: Medicare Other | Admitting: Gastroenterology

## 2021-02-19 VITALS — BP 100/58 | HR 86 | Ht 69.25 in | Wt 281.0 lb

## 2021-02-19 DIAGNOSIS — K625 Hemorrhage of anus and rectum: Secondary | ICD-10-CM | POA: Diagnosis not present

## 2021-02-19 DIAGNOSIS — I2510 Atherosclerotic heart disease of native coronary artery without angina pectoris: Secondary | ICD-10-CM

## 2021-02-19 MED ORDER — PLENVU 140 G PO SOLR: 140.0000 g | ORAL | 0 refills | Status: DC

## 2021-02-19 NOTE — Progress Notes (Signed)
02/19/2021 Leonard [name]. [id number] [birth year]   HISTORY OF PRESENT ILLNESS: This is a 75 year old male is a patient of Dr. Vena Rua.  He has history of large hyperplastic gastric polyp that was removed at [facility] previously and follow-up EGD in 10/2017 showed no further polyp and no further surveillance was recommended.  He also has history of GERD, MGUS, iron deficiency anemia, history of prostate cancer status post resection.  He is here today for complaints of rectal bleeding.  He tells me that he had about 2 weeks of bright red blood on the toilet paper and in the toilet bowl when he would have a bowel movement.  It has now been resolved for about the past week or so.  He denies any constipation or straining.  His last colonoscopy was in November 2013 at which time was normal at that time.  He has no family history of colon cancer.  He continues on daily oral iron supplements and most recent Hgb was 11.9 grams.  He follows with Dr. Julien Nordmann in oncology.   Past Medical History:  Diagnosis Date  . 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 CKD (chronic kidney disease)   . Coronary artery disease   . Diverticulosis of colon (without mention of hemorrhage) 09/05/2006   Colonoscopy   . Gastric polyp 2014   done at [facility]  . GERD (gastroesophageal reflux disease) 07/26/2001   EGD(Chronic)  . Gouty arthropathy   . Hemorrhoids   . Hiatal hernia 09/05/2006   EGD  . History of kidney stones   . Hx of colonic polyps 09/05/2006   Colonoscopy(ADENOMATOUS POLYP)  . Iron deficiency anemia, unspecified   . Monoclonal gammopathy   . OSA (obstructive sleep apnea)    USES CPAP MACHINE   . Other and unspecified hyperlipidemia   . Prostate cancer ([place]) 2015  . Type II or unspecified type diabetes mellitus without mention of complication, not stated as uncontrolled   . Unspecified essential hypertension    Past Surgical History:  Procedure Laterality Date  .  COLONOSCOPY    . ESOPHAGOGASTRODUODENOSCOPY     Done at [facility]  . ESOPHAGOGASTRODUODENOSCOPY (EGD) WITH PROPOFOL N/A 11/04/2017   Procedure: ESOPHAGOGASTRODUODENOSCOPY (EGD) WITH PROPOFOL;  Surgeon: Jerene Bears, MD;  Location: [facility];  Service: Gastroenterology;  Laterality: N/A;  . GI PROSTATE BIOPSY    . HEMORRHOID SURGERY    . INCISIONAL HERNIA REPAIR N/A 08/07/2015   Procedure: LAPAROSCOPIC INCISIONAL HERNIA repair with mesh;  Surgeon: Arta Bruce Kinsinger, MD;  Location: [facility];  Service: General;  Laterality: N/A;  . KNEE SURGERY  1999   TUMOR REMOVED LEFT KNEE & ARTHROSCOPIC SURGERY  . LYMPHADENECTOMY Bilateral 05/01/2014   Procedure: LYMPHADENECTOMY;  Surgeon: Alexis Frock, MD;  Location: [facility];  Service: Urology;  Laterality: Bilateral;  . MASS EXCISION Right 08/28/2020   Procedure: EXCISION RIGHT SHOULDER MASS;  Surgeon: Stark Klein, MD;  Location: [facility];  Service: General;  Laterality: Right;  . ROBOT ASSISTED LAPAROSCOPIC RADICAL PROSTATECTOMY N/A 05/01/2014   Procedure: ROBOTIC ASSISTED LAPAROSCOPIC RADICAL PROSTATECTOMY WITH INDOCYANINE GREEN DYE;  Surgeon: Alexis Frock, MD;  Location: [facility];  Service: Urology;  Laterality: N/A;  . UPPER GASTROINTESTINAL ENDOSCOPY      reports that he has never smoked. He has never used smokeless tobacco. He reports that he does not drink alcohol and does not use drugs. family history includes Diabetes in his brother and mother; Heart attack in his father.  Allergies  Allergen Reactions  . Dicloxacillin Rash and Other (See Comments)      . Cayenne Pepper [Cayenne] Swelling  . Nsaids Other (See Comments)    PT declines Acute renal failure 08/09/2015      Outpatient Encounter Medications as of 02/19/2021  Medication Sig  . allopurinol (ZYLOPRIM) 300 MG tablet TAKE 1 TABLET BY MOUTH DAILY TO PREVENT GOUT  . amLODipine-benazepril (LOTREL) 10-20 MG capsule TAKE 1 CAPSULE BY MOUTH EVERY DAY  . aspirin 81 MG tablet Take 81 mg by mouth  daily.  Marland Kitchen atorvastatin (LIPITOR) 10 MG tablet TAKE 1 TABLET BY MOUTH EVERY DAY.  Marland Kitchen esomeprazole (NEXIUM) 20 MG capsule Take 20 mg by mouth daily at 12 noon.  Marland Kitchen FeFum-FePoly-FA-B Cmp-C-Biot (FOLIVANE-PLUS) CAPS TAKE 1 CAPSULE BY MOUTH EVERY DAY IN THE MORNING  . ferrous gluconate (FERGON) 240 (27 FE) MG tablet Take 240 mg by mouth daily.  . nitroGLYCERIN (NITROSTAT) 0.4 MG SL tablet Place 1 tablet (0.4 mg total) under the tongue every 5 (five) minutes as needed for chest pain. Reported on 03/04/2016  . sitaGLIPtin (JANUVIA) 50 MG tablet Take 100 mg by mouth daily.  Marland Kitchen triamterene-hydrochlorothiazide (MAXZIDE-25) 37.5-25 MG tablet Take 1 tablet by mouth daily.  . [DISCONTINUED] cyclobenzaprine (FLEXERIL) 10 MG tablet Take 1 tablet (10 mg total) by mouth 3 (three) times daily as needed for muscle spasms. (Patient not taking: Reported on 02/19/2021)  . [DISCONTINUED] docusate sodium (COLACE) 100 MG capsule Take 1 capsule (100 mg total) by mouth 2 (two) times daily. (Patient not taking: Reported on 02/19/2021)  . [DISCONTINUED] oxyCODONE (OXY IR/ROXICODONE) 5 MG immediate release tablet Take 1 tablet (5 mg total) by mouth every 6 (six) hours as needed for severe pain. (Patient not taking: Reported on 02/19/2021)   No facility-administered encounter medications on file as of 02/19/2021.    REVIEW OF SYSTEMS  : All other systems reviewed and negative except where noted in the History of Present Illness.   PHYSICAL EXAM: BP (!) 100/58 (BP Location: Left Arm, Patient Position: Sitting, Cuff Size: Large)   Pulse 86   Ht 5' 9.25" (1.759 m)   Wt 281 lb (127.5 kg)   BMI 41.20 kg/m  General: Well developed AA male in no acute distress Head: Normocephalic and atraumatic Eyes:  Sclerae anicteric, conjunctiva pink. Ears: Normal auditory acuity Lungs: Clear throughout to auscultation; no W/R/R. Heart: Regular rate and rhythm; no M/R/G. Abdomen: Soft, non-distended.  BS present.  Non-tender. Rectal:  Will be done  at the time of colonoscopy. Musculoskeletal: Symmetrical with no gross deformities  Skin: No lesions on visible extremities Extremities: No edema  Neurological: Alert oriented x 4, grossly non-focal Psychological:  Alert and cooperative. Normal mood and affect  ASSESSMENT AND PLAN: *Rectal bleeding: Had 2 weeks of bright red blood on the toilet paper and in the toilet bowl with bowel movements.  Has now been resolved for about the past week or more.  Very likely hemorrhoidal/outlet source of bleeding.  Last colonoscopy was November 2013 however, so I think that it would be worth repeating colonoscopy at this time.  He has ongoing IDA that has been an issue for years.  Will plan for colonoscopy with Dr. Hilarie Fredrickson  The risks, benefits, and alternatives to colonoscopy were discussed with the patient and she consents to proceed.  CC:  [name], MD

## 2021-02-19 NOTE — Progress Notes (Signed)
Many thanks. 

## 2021-02-19 NOTE — Patient Instructions (Addendum)
If you are age 74 or older, your body mass index should be between 23-30. Your Body mass index is 41.2 kg/m. If this is out of the aforementioned range listed, please consider follow up with your Primary Care Provider.  If you are age 24 or younger, your body mass index should be between 19-25. Your Body mass index is 41.2 kg/m. If this is out of the aformentioned range listed, please consider follow up with your Primary Care Provider.   You have been scheduled for a colonoscopy. Please follow written instructions given to you at your visit today.  Please pick up your prep supplies at the pharmacy within the next 1-3 days. If you use inhalers (even only as needed), please bring them with you on the day of your procedure.  STOP iron 5 days before the colonoscopy.  It was a pleasure to see you today!  Alonza Bogus, PA-C

## 2021-02-22 ENCOUNTER — Other Ambulatory Visit: Payer: Self-pay | Admitting: Gastroenterology

## 2021-02-23 DIAGNOSIS — I491 Atrial premature depolarization: Secondary | ICD-10-CM | POA: Diagnosis not present

## 2021-02-23 DIAGNOSIS — I499 Cardiac arrhythmia, unspecified: Secondary | ICD-10-CM | POA: Diagnosis not present

## 2021-02-24 ENCOUNTER — Other Ambulatory Visit: Payer: Medicare Other | Admitting: Internal Medicine

## 2021-02-24 ENCOUNTER — Other Ambulatory Visit: Payer: Self-pay

## 2021-02-24 DIAGNOSIS — E1169 Type 2 diabetes mellitus with other specified complication: Secondary | ICD-10-CM

## 2021-02-24 DIAGNOSIS — E785 Hyperlipidemia, unspecified: Secondary | ICD-10-CM | POA: Diagnosis not present

## 2021-02-24 DIAGNOSIS — E119 Type 2 diabetes mellitus without complications: Secondary | ICD-10-CM | POA: Diagnosis not present

## 2021-02-25 LAB — HEMOGLOBIN A1C
Hgb A1c MFr Bld: 6.4 % of total Hgb — ABNORMAL HIGH (ref <5.7)
Mean Plasma Glucose: 137 mg/dL
eAG (mmol/L): 7.6 mmol/L

## 2021-02-25 NOTE — Progress Notes (Signed)
Called and let Leonard [name] know that we would be contact Dr Harrington Challenger, and he would need to follow up with her. He verbalized understanding

## 2021-02-26 ENCOUNTER — Other Ambulatory Visit: Payer: Self-pay | Admitting: Gastroenterology

## 2021-03-03 NOTE — Progress Notes (Signed)
Addendum: Reviewed and agree with assessment and management plan. Aza Dantes M, MD  

## 2021-03-23 ENCOUNTER — Ambulatory Visit: Payer: Medicare Other | Admitting: Cardiology

## 2021-04-02 ENCOUNTER — Telehealth: Payer: Self-pay | Admitting: Internal Medicine

## 2021-04-02 MED ORDER — SITAGLIPTIN PHOSPHATE 100 MG PO TABS: 100.0000 mg | ORAL_TABLET | Freq: Every day | ORAL | 1 refills | Status: DC

## 2021-04-02 NOTE — Telephone Encounter (Signed)
Leonard [name] [phone]  [name] called to say he is running out of his medication since the dosage has been increased from 50 mg to 100 mg.

## 2021-04-02 NOTE — Telephone Encounter (Signed)
Done

## 2021-04-02 NOTE — Telephone Encounter (Signed)
Discontinue Januvia 50 mg daily and e-scribe Januvia 100 mg daily x 6 months

## 2021-04-21 ENCOUNTER — Encounter: Payer: Self-pay | Admitting: Internal Medicine

## 2021-04-27 ENCOUNTER — Encounter: Payer: Self-pay | Admitting: Internal Medicine

## 2021-04-27 ENCOUNTER — Other Ambulatory Visit: Payer: Self-pay

## 2021-04-27 ENCOUNTER — Ambulatory Visit (AMBULATORY_SURGERY_CENTER): Payer: Medicare Other | Admitting: Internal Medicine

## 2021-04-27 VITALS — BP 106/69 | HR 79 | Temp 97.5°F | Resp 14 | Ht 69.0 in | Wt 281.0 lb

## 2021-04-27 DIAGNOSIS — K625 Hemorrhage of anus and rectum: Secondary | ICD-10-CM | POA: Diagnosis not present

## 2021-04-27 DIAGNOSIS — K5730 Diverticulosis of large intestine without perforation or abscess without bleeding: Secondary | ICD-10-CM

## 2021-04-27 DIAGNOSIS — K648 Other hemorrhoids: Secondary | ICD-10-CM | POA: Diagnosis not present

## 2021-04-27 DIAGNOSIS — D122 Benign neoplasm of ascending colon: Secondary | ICD-10-CM | POA: Diagnosis not present

## 2021-04-27 MED ORDER — SODIUM CHLORIDE 0.9 % IV SOLN: 500.0000 mL | Freq: Once | INTRAVENOUS | Status: DC

## 2021-04-27 NOTE — Progress Notes (Signed)
HC vitals and SF IV. Pt reports an episode of chest pain about a month ago and he took NTG with relief. Dr. Hilarie Fredrickson and Elizabeth Palau CRNA notified.Marland Kitchen

## 2021-04-27 NOTE — Progress Notes (Signed)
Report given to PACU, vss 

## 2021-04-27 NOTE — Op Note (Signed)
[facility] Patient Name: Leonard [name] Procedure Date: 04/27/2021 3:07 PM MRN: [id number] Endoscopist: Jerene Bears , MD Age: 75 Referring MD:  Date of Birth: 17-Mar-1946 Gender: Male Account #: [id number] Procedure:                Colonoscopy Indications:              Rectal bleeding Medicines:                Monitored Anesthesia Care Procedure:                Pre-Anesthesia Assessment:                           - Prior to the procedure, a History and Physical                            was performed, and patient medications and                            allergies were reviewed. The patient's tolerance of                            previous anesthesia was also reviewed. The risks                            and benefits of the procedure and the sedation                            options and risks were discussed with the patient.                            All questions were answered, and informed consent                            was obtained. Prior Anticoagulants: The patient has                            taken no previous anticoagulant or antiplatelet                            agents. ASA Grade Assessment: III - A patient with                            severe systemic disease. After reviewing the risks                            and benefits, the patient was deemed in                            satisfactory condition to undergo the procedure.                           After obtaining informed consent, the colonoscope  was passed under direct vision. Throughout the                            procedure, the patient's blood pressure, pulse, and                            oxygen saturations were monitored continuously. The                            Olympus CF-HQ190L (Serial# [id number]) Colonoscope was                            introduced through the anus and advanced to the                            cecum, identified by appendiceal orifice and                             ileocecal valve. The colonoscopy was performed                            without difficulty. The patient tolerated the                            procedure well. The quality of the bowel                            preparation was good. The ileocecal valve,                            appendiceal orifice, and rectum were photographed. Scope In: 3:12:58 PM Scope Out: 3:27:20 PM Scope Withdrawal Time: 0 hours 12 minutes 0 seconds  Total Procedure Duration: 0 hours 14 minutes 22 seconds  Findings:                 The digital rectal exam was normal.                           Three sessile polyps were found in the ascending                            colon. The polyps were 4 to 8 mm in size. These                            polyps were removed with a cold snare. Resection                            and retrieval were complete.                           Multiple small and large-mouthed diverticula were                            found in the sigmoid colon, descending colon and  ascending colon.                           Internal hemorrhoids were found during                            retroflexion. The hemorrhoids were medium-sized. Complications:            No immediate complications. Estimated Blood Loss:     Estimated blood loss was minimal. Impression:               - Three 4 to 8 mm polyps in the ascending colon,                            removed with a cold snare. Resected and retrieved.                           - Diverticulosis in the sigmoid colon, in the                            descending colon and in the ascending colon.                           - Internal hemorrhoids. This is the source of                            intermittent red blood per rectum. Recommendation:           - Patient has a contact number available for                            emergencies. The signs and symptoms of potential                            delayed  complications were discussed with the                            patient. Return to normal activities tomorrow.                            Written discharge instructions were provided to the                            patient.                           - Resume previous diet.                           - Continue present medications.                           - Await pathology results.                           - If rectal bleeding continues hemorrhoidal banding  would be a good treatment option if interested.                           - Repeat colonoscopy may be recommended. The                            colonoscopy recommendation will be determined after                            pathology results from today's exam become                            available for review. Jerene Bears, MD 04/27/2021 3:32:57 PM This report has been signed electronically.

## 2021-04-27 NOTE — Progress Notes (Signed)
1525 Ephedrine 10 mg given IV due to low BP, MD updated.  

## 2021-04-27 NOTE — Patient Instructions (Signed)
Handouts on polyps, hemorrhoids, and diverticulosis given to you today  Call the office if you are interested in hemorrhoidal banding  Await pathology results from Dr. Hilarie Fredrickson   YOU HAD AN ENDOSCOPIC PROCEDURE TODAY AT THE [facility]:   Refer to the procedure report that was given to you for any specific questions about what was found during the examination.  If the procedure report does not answer your questions, please call your gastroenterologist to clarify.  If you requested that your care partner not be given the details of your procedure findings, then the procedure report has been included in a sealed envelope for you to review at your convenience later.  YOU SHOULD EXPECT: Some feelings of bloating in the abdomen. Passage of more gas than usual.  Walking can help get rid of the air that was put into your GI tract during the procedure and reduce the bloating. If you had a lower endoscopy (such as a colonoscopy or flexible sigmoidoscopy) you may notice spotting of blood in your stool or on the toilet paper. If you underwent a bowel prep for your procedure, you may not have a normal bowel movement for a few days.  Please Note:  You might notice some irritation and congestion in your nose or some drainage.  This is from the oxygen used during your procedure.  There is no need for concern and it should clear up in a day or so.  SYMPTOMS TO REPORT IMMEDIATELY:  Following lower endoscopy (colonoscopy or flexible sigmoidoscopy):  Excessive amounts of blood in the stool  Significant tenderness or worsening of abdominal pains  Swelling of the abdomen that is new, acute  Fever of 100F or higher   For urgent or emergent issues, a gastroenterologist can be reached at any hour by calling [phone]. Do not use MyChart messaging for urgent concerns.    DIET:  We do recommend a small meal at first, but then you may proceed to your regular diet.  Drink plenty of fluids but you should  avoid alcoholic beverages for 24 hours.  ACTIVITY:  You should plan to take it easy for the rest of today and you should NOT DRIVE or use heavy machinery until tomorrow (because of the sedation medicines used during the test).    FOLLOW UP: Our staff will call the number listed on your records 48-72 hours following your procedure to check on you and address any questions or concerns that you may have regarding the information given to you following your procedure. If we do not reach you, we will leave a message.  We will attempt to reach you two times.  During this call, we will ask if you have developed any symptoms of COVID 19. If you develop any symptoms (ie: fever, flu-like symptoms, shortness of breath, cough etc.) before then, please call [phone].  If you test positive for Covid 19 in the 2 weeks post procedure, please call and report this information to [place].    If any biopsies were taken you will be contacted by phone or by letter within the next 1-3 weeks.  Please call us at [phone] if you have not heard about the biopsies in 3 weeks.    SIGNATURES/CONFIDENTIALITY: You and/or your care partner have signed paperwork which will be entered into your electronic medical record.  These signatures attest to the fact that that the information above on your After Visit Summary has been reviewed and is understood.  Full responsibility of the  confidentiality of this discharge information lies with you and/or your care-partner.  

## 2021-04-27 NOTE — Progress Notes (Signed)
Called to room to assist during endoscopic procedure.  Patient ID and intended procedure confirmed with present staff. Received instructions for my participation in the procedure from the performing physician.  

## 2021-04-29 ENCOUNTER — Telehealth: Payer: Self-pay

## 2021-04-29 NOTE — Telephone Encounter (Signed)
  Follow up Call-  Call back number 04/27/2021  Post procedure Call Back phone  # [phone]  Permission to leave phone message Yes  Some recent data might be hidden     Patient questions:  Do you have a fever, pain , or abdominal swelling? No. Pain Score  0 *  Have you tolerated food without any problems? Yes.    Have you been able to return to your normal activities? Yes.    Do you have any questions about your discharge instructions: Diet   No. Medications  No. Follow up visit  No.  Do you have questions or concerns about your Care? No.  Actions: * If pain score is 4 or above: No action needed, pain <4. Have you developed a fever since your procedure? no  2.   Have you had an respiratory symptoms (SOB or cough) since your procedure? no  3.   Have you tested positive for COVID 19 since your procedure no  4.   Have you had any family members/close contacts diagnosed with the COVID 19 since your procedure?  no   If yes to any of these questions please route to Joylene John, RN and Joella Prince, RN

## 2021-05-03 ENCOUNTER — Encounter: Payer: Self-pay | Admitting: Internal Medicine

## 2021-05-08 NOTE — Progress Notes (Signed)
Cardiology Office Note   Date:  05/11/2021   ID:  Leonard [name]., DOB June 10, 1946, MRN [id number]  PCP:  Leonard Showers, MD  Cardiologist:  Dr. Harrington Challenger, MD   Chief Complaint  Patient presents with   Follow-up    History of Present Illness: Leonard [name]. is a 75 y.o. male who presents for the evaluation of palpitations, seen for Dr. Harrington [name].  [name] has a history of nonobstructive/minimal CAD/minimal CAD by cardiac catheterization in 2007, sleep apnea, HTN, prostate cancer, HLD, MGUS and GERD.   The patient was most recently seen 08/07/2020 and preoperative clearance at which time he was felt to be an acceptable risk for holding his ASA therapy.  Otherwise he was doing well from a CV standpoint with no medication changes.  He underwent an echocardiogram in 08/2011 which showed an LVEF at 55 to 65% with normal wall motion, mild MR and no other valvular disease.  Per chart review, it appears he wore an outpatient monitor for 3 days 02/09/2021 which showed 14 episodes of SVT all lasting less than 26sec in duration with no atrial fibrillation.  Symptoms were associated with PACs.  Given this, he was referred for closer cardiology follow up   Today he states that he will occasionally feel a fluttering in his chest however not very often. He has no episodes of chest pain or other anginal equivalent. He denies SOB, LE edema, dizziness or syncope. We discussed adding a beta blocker to his regimen and following closely. Potential side effects discussed as well.  Will plan for follow up echocardiogram.   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 09/05/2006   EGD   CKD (chronic kidney disease)    Coronary artery disease    Diverticulosis of colon (without mention of hemorrhage) 09/05/2006   Colonoscopy    Gastric polyp 2014   done at [facility]   GERD (gastroesophageal reflux disease) 07/26/2001   EGD(Chronic)   Gouty arthropathy    Hemorrhoids     Hiatal hernia 09/05/2006   EGD   History of kidney stones    Hx of colonic polyps 09/05/2006   Colonoscopy(ADENOMATOUS POLYP)   Iron deficiency anemia, unspecified    Monoclonal gammopathy    OSA (obstructive sleep apnea)    USES CPAP MACHINE    Other and unspecified hyperlipidemia    Prostate cancer ([place]) 2015   Type II or unspecified type diabetes mellitus without mention of complication, not stated as uncontrolled    Unspecified essential hypertension     Past Surgical History:  Procedure Laterality Date   COLONOSCOPY     ESOPHAGOGASTRODUODENOSCOPY     Done at [facility]   ESOPHAGOGASTRODUODENOSCOPY (EGD) WITH PROPOFOL N/A 11/04/2017   Procedure: ESOPHAGOGASTRODUODENOSCOPY (EGD) WITH PROPOFOL;  Surgeon: Leonard Bears, MD;  Location: [facility];  Service: Gastroenterology;  Laterality: N/A;   GI PROSTATE BIOPSY     HEMORRHOID SURGERY     INCISIONAL HERNIA REPAIR N/A 08/07/2015   Procedure: LAPAROSCOPIC INCISIONAL HERNIA repair with mesh;  Surgeon: Leonard Bruce Kinsinger, MD;  Location: [facility];  Service: General;  Laterality: N/A;   KNEE SURGERY  1999   TUMOR REMOVED LEFT KNEE & ARTHROSCOPIC SURGERY   LYMPHADENECTOMY Bilateral 05/01/2014   Procedure: LYMPHADENECTOMY;  Surgeon: Leonard Frock, MD;  Location: [facility];  Service: Urology;  Laterality: Bilateral;   MASS EXCISION Right 08/28/2020   Procedure: EXCISION RIGHT SHOULDER MASS;  Surgeon: Leonard Klein,  MD;  Location: [place];  Service: General;  Laterality: Right;   ROBOT ASSISTED LAPAROSCOPIC RADICAL PROSTATECTOMY N/A 05/01/2014   Procedure: ROBOTIC ASSISTED LAPAROSCOPIC RADICAL PROSTATECTOMY WITH INDOCYANINE GREEN DYE;  Surgeon: Leonard Frock, MD;  Location: [facility];  Service: Urology;  Laterality: N/A;   UPPER GASTROINTESTINAL ENDOSCOPY       Current Outpatient Medications  Medication Sig Dispense Refill   allopurinol (ZYLOPRIM) 300 MG tablet TAKE 1 TABLET BY MOUTH DAILY TO PREVENT GOUT 90 tablet 3   amLODipine-benazepril  (LOTREL) 10-20 MG capsule TAKE 1 CAPSULE BY MOUTH EVERY DAY 90 capsule 3   aspirin 81 MG tablet Take 81 mg by mouth daily.     atorvastatin (LIPITOR) 10 MG tablet TAKE 1 TABLET BY MOUTH EVERY DAY. 90 tablet 3   esomeprazole (NEXIUM) 20 MG capsule Take 20 mg by mouth daily at 12 noon.     FeFum-FePoly-FA-B Cmp-C-Biot (FOLIVANE-PLUS) CAPS TAKE 1 CAPSULE BY MOUTH EVERY DAY IN THE MORNING 90 capsule 1   ferrous gluconate (FERGON) 240 (27 FE) MG tablet Take 240 mg by mouth daily.     metoprolol succinate (TOPROL XL) 25 MG 24 hr tablet Take 0.5 tablets (12.5 mg total) by mouth daily. 45 tablet 3   nitroGLYCERIN (NITROSTAT) 0.4 MG SL tablet Place 1 tablet (0.4 mg total) under the tongue every 5 (five) minutes as needed for chest pain. Reported on 03/04/2016 30 tablet 5   sitaGLIPtin (JANUVIA) 100 MG tablet Take 1 tablet (100 mg total) by mouth daily. 90 tablet 1   triamterene-hydrochlorothiazide (MAXZIDE-25) 37.5-25 MG tablet Take 1 tablet by mouth daily. 90 tablet 3   No current facility-administered medications for this visit.    Allergies:   Dicloxacillin, Cayenne pepper [cayenne], and Nsaids    Social History:  The patient  reports that he has never smoked. He has never used smokeless tobacco. He reports that he does not drink alcohol and does not use drugs.   Family History:  The patient's family history includes Diabetes in his brother and mother; Heart attack in his father.    ROS:  Please see the history of present illness.  Otherwise, review of systems are positive for none.   All other systems are reviewed and negative.    PHYSICAL EXAM: VS:  BP 130/60 (BP Location: Left Arm, Patient Position: Sitting, Cuff Size: Normal)   Pulse 79   Ht 5\' 9"  (1.753 m)   Wt 283 lb (128.4 kg)   SpO2 97%   BMI 41.79 kg/m  , BMI Body mass index is 41.79 kg/m.  General: Well developed, well nourished, NAD Neck: Negative for carotid bruits. No JVD Lungs:Clear to ausculation bilaterally. No wheezes,  rales, or rhonchi. Breathing is unlabored. Cardiovascular: RRR with S1 S2. No murmurs Extremities: No edema. Neuro: Alert and oriented. No focal deficits. No facial asymmetry. MAE spontaneously. Psych: Responds to questions appropriately with normal affect.    EKG:  EKG is not ordered today.   Recent Labs: 08/19/2020: BUN 24; Creatinine, Ser 1.43; Potassium 3.6; Sodium 139 01/06/2021: ALT 17 02/06/2021: Hemoglobin 11.9; Platelets 301; TSH 1.32    Lipid Panel    Component Value Date/Time   CHOL 143 01/06/2021 0952   TRIG 46 01/06/2021 0952   HDL 67 01/06/2021 0952   CHOLHDL 2.1 01/06/2021 0952   VLDL 18 07/01/2017 1036   LDLCALC 63 01/06/2021 0952      Wt Readings from Last 3 Encounters:  05/11/21 283 lb (128.4 kg)  04/27/21 281 lb (127.5  kg)  02/19/21 281 lb (127.5 kg)    Other studies Reviewed: Additional studies/ records that were reviewed today include:. Review of the above records demonstrates:   ZIO monitor 02/09/2021:  Patch Wear Time:  3 days and 1 hours   Max 176 bpm 02:57pm, 04/02 Min 59 bpm 06:17am, 04/02 Avg 82 bpm 6% supraventricular ectopy <1% ventricular ectopy 14 SVT episodes, all less than 20 seconds Zero atrial fibrillation Symptoms associated with PACs  Echocardiogram: 08/2011 Study Conclusions   - Left ventricle: The cavity size was normal. Systolic    function was normal. The estimated ejection fraction was    in the range of 55% to 65%. Wall motion was normal; there    were no regional wall motion abnormalities. Left    ventricular diastolic function parameters were normal.  - Mitral valve: Mild regurgitation.  - Right ventricle: The cavity size was mildly dilated. Wall    thickness was normal.  - Atrial septum: No defect or patent foramen ovale was    identified.  - Pulmonary arteries: PA peak pressure: 20mm Hg (S).    ASSESSMENT AND PLAN:  1.  SVT: -Patient seen by his PCP who placed a 3-day ZIO monitor which showed 14 episodes of  SVT and PACs therefore he was referred for closer cardiology follow-up -Continues to have mild, intermittent "fluttering" -Will start beta blocker and follow in 6-8 weeks>>will follow with echocardiogram    2. Nonobstructive CAD: -Per cath 2007 -Denies chest pain -Continue aspirin and statin.   3. HTN: -Stable, 130/60 -Continue current regimen   4. HLD: -Last LDL, 63 from 12/2020 -Continue statin   5. OSA on CPAP: -Reports compliance    Current medicines are reviewed at length with the patient today.  The patient does not have concerns regarding medicines.  The following changes have been made:  add Toprol 12.5mg  PO QD  Labs/ tests ordered today include: None  No orders of the defined types were placed in this encounter.    Disposition:   FU with Dr. Harrington [name] in 6 weeks  Signed, Kathyrn Drown, NP  05/11/2021 1:29 PM    [facility] [place] [employer], [place], [place]  [address] Phone: [phone]; Fax: [phone]

## 2021-05-11 ENCOUNTER — Ambulatory Visit (INDEPENDENT_AMBULATORY_CARE_PROVIDER_SITE_OTHER): Payer: Medicare Other | Admitting: Cardiology

## 2021-05-11 ENCOUNTER — Encounter: Payer: Self-pay | Admitting: Cardiology

## 2021-05-11 ENCOUNTER — Other Ambulatory Visit: Payer: Self-pay

## 2021-05-11 VITALS — BP 130/60 | HR 79 | Ht 69.0 in | Wt 283.0 lb

## 2021-05-11 DIAGNOSIS — E782 Mixed hyperlipidemia: Secondary | ICD-10-CM

## 2021-05-11 DIAGNOSIS — I471 Supraventricular tachycardia: Secondary | ICD-10-CM

## 2021-05-11 DIAGNOSIS — I491 Atrial premature depolarization: Secondary | ICD-10-CM | POA: Diagnosis not present

## 2021-05-11 DIAGNOSIS — I10 Essential (primary) hypertension: Secondary | ICD-10-CM | POA: Diagnosis not present

## 2021-05-11 DIAGNOSIS — G4733 Obstructive sleep apnea (adult) (pediatric): Secondary | ICD-10-CM | POA: Diagnosis not present

## 2021-05-11 MED ORDER — METOPROLOL SUCCINATE ER 25 MG PO TB24: 12.5000 mg | ORAL_TABLET | Freq: Every day | ORAL | 3 refills | Status: DC

## 2021-05-11 NOTE — Patient Instructions (Addendum)
Medication Instructions:  Your physician recommends that you continue on your current medications as directed. Please refer to the Current Medication list given to you today.   START TOPROL 12.5 MG DAILY *If you need a refill on your cardiac medications before your next appointment, please call your pharmacy*   Lab Work: NONE If you have labs (blood work) drawn today and your tests are completely normal, you will receive your results only by: [place] (if you have MyChart) OR A paper copy in the mail If you have any lab test that is abnormal or we need to change your treatment, we will call you to review the results.   Testing/Procedures: NONE   Follow-Up: At [facility], you and your health needs are our priority.  As part of our continuing mission to provide you with exceptional heart care, we have created designated Provider Care Teams.  These Care Teams include your primary Cardiologist (physician) and Advanced Practice Providers (APPs -  Physician Assistants and Nurse Practitioners) who all work together to provide you with the care you need, when you need it.  We recommend signing up for the patient portal called "MyChart".  Sign up information is provided on this After Visit Summary.  MyChart is used to connect with patients for Virtual Visits (Telemedicine).  Patients are able to view lab/test results, encounter notes, upcoming appointments, etc.  Non-urgent messages can be sent to your provider as well.   To learn more about what you can do with MyChart, go to [email or web address].    Your next appointment:   6-8 week(s)  The format for your next appointment:   In Person  Provider:   You may see Dorris Carnes, MD or one of the following Advanced Practice Providers on your designated Care Team:   Richardson Dopp, PA-C [place], [place]

## 2021-06-17 ENCOUNTER — Other Ambulatory Visit: Payer: Self-pay | Admitting: Internal Medicine

## 2021-06-17 DIAGNOSIS — D649 Anemia, unspecified: Secondary | ICD-10-CM

## 2021-06-23 NOTE — Progress Notes (Addendum)
Cardiology Office Note:    Date:  06/24/2021   ID:  Leonard Pitter., DOB 1946/03/03, MRN [id number]  PCP:  [name], MD   [place] [facility] Cardiologist:  Dorris Carnes, MD      Referring MD: [name], MD   Chief Complaint:  No chief complaint on file.    Patient Profile:    Leonard Lecours. is a 75 y.o. male with:  Coronary artery disease  Non-obs by cath in 2007 PACs 6% on monitor in 4/22 SVT Monitor in 4/22: 14 SVT episodes all < 20 secs OSA Hypertension  Prostate CA Hyperlipidemia  MGUS GERD  Prior CV studies: LONG TERM MONITOR (3-7 DAYS) INTERPRETATION 02/23/2021 Max 176 bpm 02:57pm, 04/02 Min 59 bpm 06:17am, 04/02 Avg 82 bpm 6% supraventricular ectopy <1% ventricular ectopy 14 SVT episodes, all less than 20 seconds Zero atrial fibrillation Symptoms associated with PACs  Chest CTA 05/08/16 IMPRESSION: 1. No pulmonary embolism. 2. No active pulmonary disease. 3. One vessel coronary atherosclerosis.  Echocardiogram 08/27/11 EF 55-65, no RWMA, mild MR, PASP 32  Cardiac catheterization 12/01/2005 LAD irregs RI ost 25 EF 60  History of Present Illness: Leonard [name] was last seen by Leonard Drown, NP in 6/22.  He had a monitor placed by primary care for palpitations.  This showed symptomatic PACs as well as short runs of SVT.  Leonard [name] placed him on Metoprolol.  He returns for f/u.  He is here alone.  He is doing well.  He is tolerating the low dose of Metoprolol Succinate.  He has not had any palpitations since his last visit. He has not had chest pain, shortness of breath, syncope.          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 09/05/2006   EGD   CKD (chronic kidney disease)    Coronary artery disease    Diverticulosis of colon (without mention of hemorrhage) 09/05/2006   Colonoscopy    Gastric polyp 2014   done at [facility]   GERD (gastroesophageal reflux disease) 07/26/2001   EGD(Chronic)    Gouty arthropathy    Hemorrhoids    Hiatal hernia 09/05/2006   EGD   History of kidney stones    Hx of colonic polyps 09/05/2006   Colonoscopy(ADENOMATOUS POLYP)   Iron deficiency anemia, unspecified    Monoclonal gammopathy    OSA (obstructive sleep apnea)    USES CPAP MACHINE    Other and unspecified hyperlipidemia    Prostate cancer ([place]) 2015   Type II or unspecified type diabetes mellitus without mention of complication, not stated as uncontrolled    Unspecified essential hypertension     Current Medications: Current Meds  Medication Sig   allopurinol (ZYLOPRIM) 300 MG tablet TAKE 1 TABLET BY MOUTH DAILY TO PREVENT GOUT   amLODipine-benazepril (LOTREL) 10-20 MG capsule TAKE 1 CAPSULE BY MOUTH EVERY DAY   aspirin 81 MG tablet Take 81 mg by mouth daily.   atorvastatin (LIPITOR) 10 MG tablet TAKE 1 TABLET BY MOUTH EVERY DAY.   esomeprazole (NEXIUM) 20 MG capsule Take 20 mg by mouth daily at 12 noon.   FeFum-FePoly-FA-B Cmp-C-Biot (FOLIVANE-PLUS) CAPS TAKE 1 CAPSULE BY MOUTH EVERY DAY IN THE MORNING   ferrous gluconate (FERGON) 240 (27 FE) MG tablet Take 240 mg by mouth daily.   metoprolol succinate (TOPROL XL) 25 MG 24 hr tablet Take 0.5 tablets (12.5 mg total) by mouth daily.   nitroGLYCERIN (NITROSTAT)  0.4 MG SL tablet Place 1 tablet (0.4 mg total) under the tongue every 5 (five) minutes as needed for chest pain. Reported on 03/04/2016   sitaGLIPtin (JANUVIA) 100 MG tablet Take 1 tablet (100 mg total) by mouth daily.   triamterene-hydrochlorothiazide (MAXZIDE-25) 37.5-25 MG tablet Take 1 tablet by mouth daily.     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 There is no history of Colon cancer, Stomach cancer, Esophageal cancer, Rectal cancer, or Liver cancer.  ROS   EKGs/Labs/Other Test Reviewed:    EKG:  EKG is   ordered today.  The ekg ordered today demonstrates normal sinus rhythm, HR 84, normal axis, PACs, non-specific ST-TW changes, QTc 458 ms, similar to old tracings   Recent Labs: 08/19/2020: BUN 24; Creatinine, Ser 1.43; Potassium 3.6; Sodium 139 01/06/2021: ALT 17 02/06/2021: Hemoglobin 11.9; Platelets 301; TSH 1.32   Recent Lipid Panel Lab Results  Component Value Date/Time   CHOL 143 01/06/2021 09:52 AM   TRIG 46 01/06/2021 09:52 AM   HDL 67 01/06/2021 09:52 AM   LDLCALC 63 01/06/2021 09:52 AM      Risk Assessment/Calculations:      Physical Exam:    VS:  BP 117/70   Pulse 87   Ht '5\' 9"'$  (1.753 m)   Wt 284 lb (128.8 kg)   SpO2 94%   BMI 41.94 kg/m     Wt Readings from Last 3 Encounters:  06/24/21 284 lb (128.8 kg)  05/11/21 283 lb (128.4 kg)  04/27/21 281 lb (127.5 kg)     Constitutional:      Appearance: Healthy appearance. Not in distress.  Neck:     Vascular: JVD normal.  Pulmonary:     Effort: Pulmonary effort is normal.     Breath sounds: No wheezing. No rales.  Cardiovascular:     Normal rate. Regularly irregular rhythm. Normal S1. Normal S2.      Murmurs: There is no murmur.  Edema:    Peripheral edema absent.  Abdominal:     Palpations: Abdomen is soft.  Skin:    General: Skin is warm and dry.  Neurological:     General: No focal deficit present.     Mental Status: Alert and oriented to person, place and time.     Cranial Nerves: Cranial nerves are intact.         ASSESSMENT & PLAN:    1. PAC (premature atrial contraction) 2. SVT (supraventricular tachycardia) ([place]) He is currently asymptomatic.  He had 6% PACs and 14 SVT episodes on his monitor.  Continue current dose of Metoprolol succinate.  I will get a BMET, Mg2+ today to make sure his K+ and Mg2+ are  normal.  He did drink some Pepsi before coming in today.  It is possible his PACs are related to caffeine.  He knows to return sooner if he has recurrently palpitations.  3. Coronary artery disease involving native coronary artery of native heart without angina pectoris Non-obstructive coronary artery disease by cardiac catheterization in in 2007.  He is not having angina.  Continue ASA,  statin.  4. Essential hypertension The patient's blood pressure is controlled on his current regimen.  Continue current dose of Amlodipine/Benazepril, Metoprolol Succinate, HCTZ/Trimateren.    5. Mixed hyperlipidemia LDL optimal on most recent lab work.  Continue current Rx.           Dispo:  No follow-ups on file.   Medication Adjustments/Labs and Tests Ordered: Current medicines are reviewed at length with the patient today.  Concerns regarding medicines are outlined above.  Tests Ordered: Orders Placed This Encounter  Procedures   Magnesium   Basic metabolic panel   EKG [id number]   Medication Changes: No orders of the defined types were placed in this encounter.   Signed, Richardson Dopp, PA-C  06/24/2021 12:29 PM    [facility] [place], [place], [place]  [address] Phone: [phone]; Fax: [phone]

## 2021-06-24 ENCOUNTER — Other Ambulatory Visit: Payer: Self-pay

## 2021-06-24 ENCOUNTER — Encounter: Payer: Self-pay | Admitting: Physician Assistant

## 2021-06-24 ENCOUNTER — Ambulatory Visit (INDEPENDENT_AMBULATORY_CARE_PROVIDER_SITE_OTHER): Payer: Medicare Other | Admitting: Physician Assistant

## 2021-06-24 VITALS — BP 117/70 | HR 87 | Ht 69.0 in | Wt 284.0 lb

## 2021-06-24 DIAGNOSIS — I10 Essential (primary) hypertension: Secondary | ICD-10-CM

## 2021-06-24 DIAGNOSIS — I471 Supraventricular tachycardia, unspecified: Secondary | ICD-10-CM

## 2021-06-24 DIAGNOSIS — E782 Mixed hyperlipidemia: Secondary | ICD-10-CM

## 2021-06-24 DIAGNOSIS — I2510 Atherosclerotic heart disease of native coronary artery without angina pectoris: Secondary | ICD-10-CM | POA: Diagnosis not present

## 2021-06-24 DIAGNOSIS — I491 Atrial premature depolarization: Secondary | ICD-10-CM | POA: Diagnosis not present

## 2021-06-24 LAB — BASIC METABOLIC PANEL
BUN/Creatinine Ratio: 17 (ref 10–24)
BUN: 30 mg/dL — ABNORMAL HIGH (ref 8–27)
CO2: 21 mmol/L (ref 20–29)
Calcium: 9.6 mg/dL (ref 8.6–10.2)
Chloride: 99 mmol/L (ref 96–106)
Creatinine, Ser: 1.74 mg/dL — ABNORMAL HIGH (ref 0.76–1.27)
Glucose: 115 mg/dL — ABNORMAL HIGH (ref 65–99)
Potassium: 4.1 mmol/L (ref 3.5–5.2)
Sodium: 137 mmol/L (ref 134–144)
eGFR: 41 mL/min/{1.73_m2} — ABNORMAL LOW (ref >59)

## 2021-06-24 LAB — MAGNESIUM: Magnesium: 2.2 mg/dL (ref 1.6–2.3)

## 2021-06-24 NOTE — Patient Instructions (Signed)
Medication Instructions:  Your physician recommends that you continue on your current medications as directed. Please refer to the Current Medication list given to you today.  Labwork:  You will have labs drawn today:BMP and Mg level  Testing/Procedures: None ordered.  Follow-Up: Your physician recommends that you schedule a follow-up appointment in:   12 months with Dr. Harrington Challenger   Any Other Special Instructions Will Be Listed Below (If Applicable).     If you need a refill on your cardiac medications before your next appointment, please call your pharmacy.

## 2021-07-14 ENCOUNTER — Other Ambulatory Visit: Payer: Self-pay

## 2021-07-14 ENCOUNTER — Inpatient Hospital Stay: Payer: Medicare Other | Attending: Internal Medicine

## 2021-07-14 DIAGNOSIS — D472 Monoclonal gammopathy: Secondary | ICD-10-CM | POA: Insufficient documentation

## 2021-07-14 DIAGNOSIS — D508 Other iron deficiency anemias: Secondary | ICD-10-CM

## 2021-07-14 DIAGNOSIS — D509 Iron deficiency anemia, unspecified: Secondary | ICD-10-CM | POA: Diagnosis not present

## 2021-07-14 DIAGNOSIS — Z79899 Other long term (current) drug therapy: Secondary | ICD-10-CM | POA: Diagnosis not present

## 2021-07-14 LAB — IRON AND TIBC
Iron: 56 ug/dL (ref 42–163)
Saturation Ratios: 18 % — ABNORMAL LOW (ref 20–55)
TIBC: 304 ug/dL (ref 202–409)
UIBC: 248 ug/dL (ref 117–376)

## 2021-07-14 LAB — CMP (CANCER CENTER ONLY)
ALT: 19 U/L (ref 0–44)
AST: 16 U/L (ref 15–41)
Albumin: 3.4 g/dL — ABNORMAL LOW (ref 3.5–5.0)
Alkaline Phosphatase: 87 U/L (ref 38–126)
Anion gap: 9 (ref 5–15)
BUN: 19 mg/dL (ref 8–23)
CO2: 23 mmol/L (ref 22–32)
Calcium: 9.4 mg/dL (ref 8.9–10.3)
Chloride: 105 mmol/L (ref 98–111)
Creatinine: 1.51 mg/dL — ABNORMAL HIGH (ref 0.61–1.24)
GFR, Estimated: 48 mL/min — ABNORMAL LOW (ref >60)
Glucose, Bld: 172 mg/dL — ABNORMAL HIGH (ref 70–99)
Potassium: 3.7 mmol/L (ref 3.5–5.1)
Sodium: 137 mmol/L (ref 135–145)
Total Bilirubin: 0.4 mg/dL (ref 0.3–1.2)
Total Protein: 7.2 g/dL (ref 6.5–8.1)

## 2021-07-14 LAB — CBC WITH DIFFERENTIAL (CANCER CENTER ONLY)
Abs Immature Granulocytes: 0.03 10*3/uL (ref 0.00–0.07)
Basophils Absolute: 0 10*3/uL (ref 0.0–0.1)
Basophils Relative: 0 %
Eosinophils Absolute: 0.2 10*3/uL (ref 0.0–0.5)
Eosinophils Relative: 1 %
HCT: 34.7 % — ABNORMAL LOW (ref 39.0–52.0)
Hemoglobin: 11.5 g/dL — ABNORMAL LOW (ref 13.0–17.0)
Immature Granulocytes: 0 %
Lymphocytes Relative: 30 %
Lymphs Abs: 3.1 10*3/uL (ref 0.7–4.0)
MCH: 31.3 pg (ref 26.0–34.0)
MCHC: 33.1 g/dL (ref 30.0–36.0)
MCV: 94.6 fL (ref 80.0–100.0)
Monocytes Absolute: 0.6 10*3/uL (ref 0.1–1.0)
Monocytes Relative: 6 %
Neutro Abs: 6.6 10*3/uL (ref 1.7–7.7)
Neutrophils Relative %: 63 %
Platelet Count: 258 10*3/uL (ref 150–400)
RBC: 3.67 MIL/uL — ABNORMAL LOW (ref 4.22–5.81)
RDW: 14.8 % (ref 11.5–15.5)
WBC Count: 10.5 10*3/uL (ref 4.0–10.5)
nRBC: 0 % (ref 0.0–0.2)

## 2021-07-14 LAB — LACTATE DEHYDROGENASE: LDH: 143 U/L (ref 98–192)

## 2021-07-14 LAB — FERRITIN: Ferritin: 145 ng/mL (ref 24–336)

## 2021-07-15 LAB — IGG, IGA, IGM
IgA: 238 mg/dL (ref 61–437)
IgG (Immunoglobin G), Serum: 994 mg/dL (ref 603–1613)
IgM (Immunoglobulin M), Srm: 167 mg/dL — ABNORMAL HIGH (ref 15–143)

## 2021-07-15 LAB — BETA 2 MICROGLOBULIN, SERUM: Beta-2 Microglobulin: 2.4 mg/L (ref 0.6–2.4)

## 2021-07-15 LAB — KAPPA/LAMBDA LIGHT CHAINS
Kappa free light chain: 59.3 mg/L — ABNORMAL HIGH (ref 3.3–19.4)
Kappa, lambda light chain ratio: 2.26 — ABNORMAL HIGH (ref 0.26–1.65)
Lambda free light chains: 26.2 mg/L (ref 5.7–26.3)

## 2021-07-21 ENCOUNTER — Inpatient Hospital Stay: Payer: Medicare Other | Attending: Internal Medicine | Admitting: Internal Medicine

## 2021-07-21 ENCOUNTER — Other Ambulatory Visit: Payer: Self-pay

## 2021-07-21 ENCOUNTER — Other Ambulatory Visit: Payer: Medicare Other | Admitting: Internal Medicine

## 2021-07-21 VITALS — BP 152/71 | HR 83 | Temp 96.9°F | Resp 18 | Wt 282.4 lb

## 2021-07-21 DIAGNOSIS — Z8739 Personal history of other diseases of the musculoskeletal system and connective tissue: Secondary | ICD-10-CM | POA: Diagnosis not present

## 2021-07-21 DIAGNOSIS — D509 Iron deficiency anemia, unspecified: Secondary | ICD-10-CM | POA: Diagnosis not present

## 2021-07-21 DIAGNOSIS — E785 Hyperlipidemia, unspecified: Secondary | ICD-10-CM

## 2021-07-21 DIAGNOSIS — Z0000 Encounter for general adult medical examination without abnormal findings: Secondary | ICD-10-CM

## 2021-07-21 DIAGNOSIS — N189 Chronic kidney disease, unspecified: Secondary | ICD-10-CM | POA: Insufficient documentation

## 2021-07-21 DIAGNOSIS — Z8546 Personal history of malignant neoplasm of prostate: Secondary | ICD-10-CM

## 2021-07-21 DIAGNOSIS — I129 Hypertensive chronic kidney disease with stage 1 through stage 4 chronic kidney disease, or unspecified chronic kidney disease: Secondary | ICD-10-CM | POA: Insufficient documentation

## 2021-07-21 DIAGNOSIS — R2231 Localized swelling, mass and lump, right upper limb: Secondary | ICD-10-CM

## 2021-07-21 DIAGNOSIS — Z7982 Long term (current) use of aspirin: Secondary | ICD-10-CM | POA: Insufficient documentation

## 2021-07-21 DIAGNOSIS — I2510 Atherosclerotic heart disease of native coronary artery without angina pectoris: Secondary | ICD-10-CM

## 2021-07-21 DIAGNOSIS — Z86010 Personal history of colonic polyps: Secondary | ICD-10-CM

## 2021-07-21 DIAGNOSIS — D508 Other iron deficiency anemias: Secondary | ICD-10-CM | POA: Diagnosis not present

## 2021-07-21 DIAGNOSIS — E1169 Type 2 diabetes mellitus with other specified complication: Secondary | ICD-10-CM

## 2021-07-21 DIAGNOSIS — N183 Chronic kidney disease, stage 3 unspecified: Secondary | ICD-10-CM

## 2021-07-21 DIAGNOSIS — Z860101 Personal history of adenomatous and serrated colon polyps: Secondary | ICD-10-CM

## 2021-07-21 DIAGNOSIS — Z7984 Long term (current) use of oral hypoglycemic drugs: Secondary | ICD-10-CM | POA: Insufficient documentation

## 2021-07-21 DIAGNOSIS — G4733 Obstructive sleep apnea (adult) (pediatric): Secondary | ICD-10-CM | POA: Diagnosis not present

## 2021-07-21 DIAGNOSIS — Z79899 Other long term (current) drug therapy: Secondary | ICD-10-CM | POA: Diagnosis not present

## 2021-07-21 DIAGNOSIS — E119 Type 2 diabetes mellitus without complications: Secondary | ICD-10-CM | POA: Insufficient documentation

## 2021-07-21 DIAGNOSIS — I10 Essential (primary) hypertension: Secondary | ICD-10-CM

## 2021-07-21 DIAGNOSIS — D472 Monoclonal gammopathy: Secondary | ICD-10-CM | POA: Insufficient documentation

## 2021-07-21 DIAGNOSIS — C61 Malignant neoplasm of prostate: Secondary | ICD-10-CM | POA: Insufficient documentation

## 2021-07-21 DIAGNOSIS — N393 Stress incontinence (female) (male): Secondary | ICD-10-CM

## 2021-07-21 DIAGNOSIS — Z6841 Body Mass Index (BMI) 40.0 and over, adult: Secondary | ICD-10-CM | POA: Diagnosis not present

## 2021-07-21 DIAGNOSIS — I499 Cardiac arrhythmia, unspecified: Secondary | ICD-10-CM | POA: Diagnosis not present

## 2021-07-21 NOTE — Progress Notes (Signed)
[place] [phone]   [phone]  OFFICE PROGRESS NOTE  [name], MD [address] [place] [id number]  DIAGNOSIS:  1) monoclonal gammopathy of undetermined significance.  2) iron deficiency anemia.  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 Tresa Moore.   PRIOR THERAPY: Feraheme infusion x 2 doses, last dose was given in October of 2014.  CURRENT THERAPY: Over-the-counter ferrous sulfate.  INTERVAL HISTORY: Leonard [name]. 75 y.o. male returns to the clinic today for 76-monthfollow-up visit.  The patient is feeling fine today with no concerning complaints.  He denied having any fatigue or weakness.  He denied having any chest pain, shortness of breath, cough or hemoptysis.  He denied having any weight loss or night sweats.  He has no headache or visual changes.  He denied having any nausea, vomiting, diarrhea or constipation.  He continues to tolerate the oral ferrous sulfate fairly well.  The patient is here today for evaluation with repeat CBC, iron study and ferritin as well as myeloma panel.  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 09/05/2006   EGD   CKD (chronic kidney disease)    Coronary artery disease    Diverticulosis of colon (without mention of hemorrhage) 09/05/2006   Colonoscopy    [facility]   [facility]  GERD (gastroesophageal reflux disease) 07/26/2001   EGD(Chronic)   Gouty arthropathy    Hemorrhoids    Hiatal hernia 09/05/2006   EGD   History of kidney stones    Hx of colonic polyps 09/05/2006   Colonoscopy(ADENOMATOUS POLYP)   Iron deficiency anemia, unspecified    Monoclonal gammopathy    OSA (obstructive sleep apnea)    USES CPAP MACHINE    Other and unspecified  hyperlipidemia    [place] [place]   Type II or unspecified type diabetes mellitus without mention of complication, not stated as uncontrolled    Unspecified essential hypertension     ALLERGIES:  is allergic to dicloxacillin, cayenne pepper [cayenne], and nsaids.  MEDICATIONS:  Current Outpatient Medications  Medication Sig Dispense Refill   allopurinol (ZYLOPRIM) 300 MG tablet TAKE 1 TABLET BY MOUTH DAILY TO PREVENT GOUT 90 tablet 3   amLODipine-benazepril (LOTREL) 10-20 MG capsule TAKE 1 CAPSULE BY MOUTH EVERY DAY 90 capsule 3   aspirin 81 MG tablet Take 81 mg by mouth daily.     atorvastatin (LIPITOR) 10 MG tablet TAKE 1 TABLET BY MOUTH EVERY DAY. 90 tablet 3   esomeprazole (NEXIUM) 20 MG capsule Take 20 mg by mouth daily at 12 noon.     FeFum-FePoly-FA-B Cmp-C-Biot (FOLIVANE-PLUS) CAPS TAKE 1 CAPSULE BY MOUTH EVERY DAY IN THE MORNING 90 capsule 1   ferrous gluconate (FERGON) 240 (27 FE) MG tablet Take 240 mg by mouth daily.     metoprolol succinate (TOPROL XL) 25 MG 24 hr tablet Take 0.5 tablets (12.5 mg total) by mouth daily. 45 tablet 3   nitroGLYCERIN (NITROSTAT) 0.4 MG SL tablet Place 1 tablet (0.4 mg total) under the tongue every 5 (five) minutes as needed for chest pain. Reported on 03/04/2016 30 tablet 5   sitaGLIPtin (JANUVIA) 100 MG tablet Take 1 tablet (100 mg total) by mouth daily. 90 tablet 1   triamterene-hydrochlorothiazide (MAXZIDE-25) 37.5-25 MG  tablet Take 1 tablet by mouth daily. 90 tablet 3   No current facility-administered medications for this visit.    SURGICAL HISTORY:  Past Surgical History:  Procedure Laterality Date   COLONOSCOPY     ESOPHAGOGASTRODUODENOSCOPY     Done at [facility]   ESOPHAGOGASTRODUODENOSCOPY (EGD) WITH PROPOFOL N/A 11/04/2017   Procedure: ESOPHAGOGASTRODUODENOSCOPY (EGD) WITH PROPOFOL;  Surgeon: Jerene Bears, MD;  Location: [facility];  Service: Gastroenterology;  Laterality: N/A;   GI PROSTATE BIOPSY     HEMORRHOID SURGERY      INCISIONAL HERNIA REPAIR N/A 08/07/2015   Procedure: LAPAROSCOPIC INCISIONAL HERNIA repair with mesh;  Surgeon: Arta Bruce Kinsinger, MD;  Location: [facility];  Service: General;  Laterality: N/A;   KNEE SURGERY  1999   TUMOR REMOVED LEFT KNEE & ARTHROSCOPIC SURGERY   LYMPHADENECTOMY Bilateral 05/01/2014   Procedure: LYMPHADENECTOMY;  Surgeon: Alexis Frock, MD;  Location: [facility];  Service: Urology;  Laterality: Bilateral;   MASS EXCISION Right 08/28/2020   Procedure: EXCISION RIGHT SHOULDER MASS;  Surgeon: Stark Klein, MD;  Location: [place];  Service: General;  Laterality: Right;   ROBOT ASSISTED LAPAROSCOPIC RADICAL PROSTATECTOMY N/A 05/01/2014   Procedure: ROBOTIC ASSISTED LAPAROSCOPIC RADICAL PROSTATECTOMY WITH INDOCYANINE GREEN DYE;  Surgeon: Alexis Frock, MD;  Location: [facility];  Service: Urology;  Laterality: N/A;   UPPER GASTROINTESTINAL ENDOSCOPY      REVIEW OF SYSTEMS:  A comprehensive review of systems was negative.   PHYSICAL EXAMINATION: General appearance: alert, cooperative, and no distress Head: Normocephalic, without obvious abnormality, atraumatic Neck: no adenopathy Lymph nodes: Cervical, supraclavicular, and axillary nodes normal. Resp: clear to auscultation bilaterally Back: symmetric, no curvature. ROM normal. No CVA tenderness. Cardio: irregularly irregular rhythm GI: soft, non-tender; bowel sounds normal; no masses,  no organomegaly Extremities: extremities normal, atraumatic, no cyanosis or edema  ECOG PERFORMANCE STATUS: 1 - Symptomatic but completely ambulatory  Blood pressure (!) 152/71, pulse 83, temperature (!) 96.9 F (36.1 C), temperature source Tympanic, resp. rate 18, weight 282 lb 6 oz (128.1 kg), SpO2 98 %.  LABORATORY DATA: Lab Results  Component Value Date   WBC 10.5 07/14/2021   HGB 11.5 (L) 07/14/2021   HCT 34.7 (L) 07/14/2021   MCV 94.6 07/14/2021   PLT 258 07/14/2021      Chemistry      Component Value Date/Time   NA 137 07/14/2021  1006   NA 137 06/24/2021 1227   NA 140 09/14/2017 0928   K 3.7 07/14/2021 1006   K 4.0 09/14/2017 0928   CL 105 07/14/2021 1006   CL 104 04/17/2013 0857   CO2 23 07/14/2021 1006   CO2 27 09/14/2017 0928   BUN 19 07/14/2021 1006   BUN 30 (H) 06/24/2021 1227   BUN 28.3 (H) 09/14/2017 0928   CREATININE 1.51 (H) 07/14/2021 1006   CREATININE 1.39 (H) 07/08/2020 0907   CREATININE 1.4 (H) 09/14/2017 0928      Component Value Date/Time   CALCIUM 9.4 07/14/2021 1006   CALCIUM 10.1 09/14/2017 0928   ALKPHOS 87 07/14/2021 1006   ALKPHOS 80 09/14/2017 0928   AST 16 07/14/2021 1006   AST 17 09/14/2017 0928   ALT 19 07/14/2021 1006   ALT 18 09/14/2017 0928   BILITOT 0.4 07/14/2021 1006   BILITOT 0.45 09/14/2017 0928     RADIOGRAPHIC STUDIES: No results found.  ASSESSMENT AND PLAN:  This is a very pleasant 75 years old African-American male with iron deficiency anemia secondary to history of gastrointestinal blood  loss.  The patient also has a history of MGUS and currently on observation. He is on over-the-counter iron tablet for the history of iron deficiency anemia and tolerating it fairly well. The patient has no concerning complaints today. Repeat CBC, iron study and ferritin are unremarkable. Repeat myeloma panel showed no concerning findings for disease progression. I recommended for the patient to continue on the oral iron tablets for now. I will see him back for follow-up visit in 6 months for evaluation with repeat CBC, iron study and ferritin. The patient was advised to call immediately if he has any other concerning symptoms in the interval. The patient voices understanding of current disease status and treatment options and is in agreement with the current care plan. All questions were answered. The patient knows to call the clinic with any problems, questions or concerns. We can certainly see the patient much sooner if necessary.   Disclaimer: This note was dictated with  voice recognition software. Similar sounding words can inadvertently be transcribed and may not be corrected upon review.

## 2021-07-22 LAB — COMPLETE METABOLIC PANEL WITH GFR
AG Ratio: 1.4 (calc) (ref 1.0–2.5)
ALT: 18 U/L (ref 9–46)
AST: 16 U/L (ref 10–35)
Albumin: 4.2 g/dL (ref 3.6–5.1)
Alkaline phosphatase (APISO): 82 U/L (ref 35–144)
BUN/Creatinine Ratio: 19 (calc) (ref 6–22)
BUN: 26 mg/dL — ABNORMAL HIGH (ref 7–25)
CO2: 24 mmol/L (ref 20–32)
Calcium: 9.8 mg/dL (ref 8.6–10.3)
Chloride: 104 mmol/L (ref 98–110)
Creat: 1.37 mg/dL — ABNORMAL HIGH (ref 0.70–1.28)
Globulin: 3 g/dL (calc) (ref 1.9–3.7)
Glucose, Bld: 116 mg/dL — ABNORMAL HIGH (ref 65–99)
Potassium: 4.1 mmol/L (ref 3.5–5.3)
Sodium: 140 mmol/L (ref 135–146)
Total Bilirubin: 0.5 mg/dL (ref 0.2–1.2)
Total Protein: 7.2 g/dL (ref 6.1–8.1)
eGFR: 54 mL/min/{1.73_m2} — ABNORMAL LOW (ref >=60)

## 2021-07-22 LAB — LIPID PANEL
Cholesterol: 159 mg/dL (ref <200)
HDL: 78 mg/dL (ref >=40)
LDL Cholesterol (Calc): 67 mg/dL (calc)
Non-HDL Cholesterol (Calc): 81 mg/dL (calc) (ref <130)
Total CHOL/HDL Ratio: 2 (calc) (ref <5.0)
Triglycerides: 56 mg/dL (ref <150)

## 2021-07-22 LAB — CBC WITH DIFFERENTIAL/PLATELET
Absolute Monocytes: 781 cells/uL (ref 200–950)
Basophils Absolute: 24 cells/uL (ref 0–200)
Basophils Relative: 0.2 %
Eosinophils Absolute: 146 cells/uL (ref 15–500)
Eosinophils Relative: 1.2 %
HCT: 36.7 % — ABNORMAL LOW (ref 38.5–50.0)
Hemoglobin: 12.1 g/dL — ABNORMAL LOW (ref 13.2–17.1)
Lymphs Abs: 4099 cells/uL — ABNORMAL HIGH (ref 850–3900)
MCH: 30.7 pg (ref 27.0–33.0)
MCHC: 33 g/dL (ref 32.0–36.0)
MCV: 93.1 fL (ref 80.0–100.0)
MPV: 10 fL (ref 7.5–12.5)
Monocytes Relative: 6.4 %
Neutro Abs: 7149 cells/uL (ref 1500–7800)
Neutrophils Relative %: 58.6 %
Platelets: 308 10*3/uL (ref 140–400)
RBC: 3.94 10*6/uL — ABNORMAL LOW (ref 4.20–5.80)
RDW: 14.6 % (ref 11.0–15.0)
Total Lymphocyte: 33.6 %
WBC: 12.2 10*3/uL — ABNORMAL HIGH (ref 3.8–10.8)

## 2021-07-22 LAB — HEMOGLOBIN A1C
Hgb A1c MFr Bld: 6.5 % of total Hgb — ABNORMAL HIGH (ref <5.7)
Mean Plasma Glucose: 140 mg/dL
eAG (mmol/L): 7.7 mmol/L

## 2021-07-22 LAB — MICROALBUMIN / CREATININE URINE RATIO
Creatinine, Urine: 162 mg/dL (ref 20–320)
Microalb Creat Ratio: 17 mcg/mg creat (ref <30)
Microalb, Ur: 2.8 mg/dL

## 2021-07-22 LAB — PSA: PSA: <0.04 ng/mL (ref <=4.00)

## 2021-07-23 ENCOUNTER — Encounter: Payer: Medicare Other | Admitting: Internal Medicine

## 2021-08-10 ENCOUNTER — Other Ambulatory Visit: Payer: Self-pay

## 2021-08-10 ENCOUNTER — Ambulatory Visit (INDEPENDENT_AMBULATORY_CARE_PROVIDER_SITE_OTHER): Payer: Medicare Other | Admitting: Internal Medicine

## 2021-08-10 ENCOUNTER — Encounter: Payer: Self-pay | Admitting: Internal Medicine

## 2021-08-10 DIAGNOSIS — E1169 Type 2 diabetes mellitus with other specified complication: Secondary | ICD-10-CM | POA: Diagnosis not present

## 2021-08-10 DIAGNOSIS — Z23 Encounter for immunization: Secondary | ICD-10-CM | POA: Diagnosis not present

## 2021-08-10 DIAGNOSIS — Z0000 Encounter for general adult medical examination without abnormal findings: Secondary | ICD-10-CM

## 2021-08-10 DIAGNOSIS — D472 Monoclonal gammopathy: Secondary | ICD-10-CM | POA: Diagnosis not present

## 2021-08-10 DIAGNOSIS — N183 Chronic kidney disease, stage 3 unspecified: Secondary | ICD-10-CM

## 2021-08-10 DIAGNOSIS — I10 Essential (primary) hypertension: Secondary | ICD-10-CM | POA: Diagnosis not present

## 2021-08-10 DIAGNOSIS — Z8546 Personal history of malignant neoplasm of prostate: Secondary | ICD-10-CM

## 2021-08-10 DIAGNOSIS — E785 Hyperlipidemia, unspecified: Secondary | ICD-10-CM

## 2021-08-10 DIAGNOSIS — Z8249 Family history of ischemic heart disease and other diseases of the circulatory system: Secondary | ICD-10-CM

## 2021-08-10 DIAGNOSIS — Z86010 Personal history of colonic polyps: Secondary | ICD-10-CM | POA: Diagnosis not present

## 2021-08-10 DIAGNOSIS — Z6841 Body Mass Index (BMI) 40.0 and over, adult: Secondary | ICD-10-CM

## 2021-08-10 DIAGNOSIS — I2510 Atherosclerotic heart disease of native coronary artery without angina pectoris: Secondary | ICD-10-CM | POA: Diagnosis not present

## 2021-08-10 LAB — POCT URINALYSIS DIP (MANUAL ENTRY)
Bilirubin, UA: NEGATIVE
Glucose, UA: NEGATIVE mg/dL
Ketones, POC UA: NEGATIVE mg/dL
Leukocytes, UA: NEGATIVE
Nitrite, UA: NEGATIVE
Protein Ur, POC: NEGATIVE mg/dL
Spec Grav, UA: 1.015 (ref 1.010–1.025)
Urobilinogen, UA: 0.2 E.U./dL
pH, UA: 6 (ref 5.0–8.0)

## 2021-08-10 NOTE — Patient Instructions (Signed)
Flu vaccine given.  It was a pleasure to see you today.  Continue current medications.  Please work on diet exercise and weight loss.  Try to walk some every day.  Return in 6 months.  Have COVID booster in the near future.

## 2021-08-10 NOTE — Progress Notes (Signed)
Subjective:    Patient ID: Leonard Pitter., male    DOB: August 05, 1946, 75 y.o.   MRN: [id number]  HPI 75 year old Male for health maintenance exam  and evaluation of medical issues.  He has a history of benign monoclonal gammopathy and is followed by Dr. Earlie Server.  He saw Dr. Earlie Server recently and is to return in 6 months if this issue is stable.  He has had some pain in his right lumbosacral area and buttock recently.  He thought it might be his mattress but I suspect he strained a muscle in that area or may have some mild radiculopathy.  He is encouraged to walk more.  Last October and he had a benign mass removed from his right shoulder area adjacent to the superior margin of the right trapezius muscle measuring 5.5 cm.  History of prostate cancer status post robotic prostatectomy in June 2015 by Dr. Tresa Moore.  In October 2020 he had an episode of acute pyelonephritis treated as an outpatient.  Followed by cardiology for coronary artery disease.  Had minimal coronary disease on catheterization in 2007.  History of gout.  History of tinea versicolor  History of peripheral neuropathy  History of low testosterone  Longstanding history of GE reflux treated with PPI.  Social history: He retired from [employer].  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  He is married.  He does not smoke or consume alcohol.  Wife has retired from the [employer] as well at the beginning of this year.  Family history: Father died of an MI.  Mother died of an MI.  2 brothers with history of diabetes.  1 sister.  Mother had diabetes.  Both parents had hypertension.   He has a history of essential hypertension, type 2 diabetes mellitus, obesity, metabolic syndrome, obstructive sleep apnea and hyperlipidemia. History of PACs  3 tubular adenomas removed June 2022 with 3 year follow up recommended by Dr. Hilarie Fredrickson. Has internal hemorrghoids that bleed intermittently. Review of  Systems no new complaints. Denies chest pain SOB, abdominal pain     Objective:   Physical Exam temperature 97.6 degrees pulse oximetry 94% pulse 78 and regular Skin warm and dry.  No carotid bruits.  Chest clear to auscultation.  Cardiac exam: Regular rate and rhythm without ectopy.  Abdomen obese soft nondistended without hepatosplenomegaly masses or tenderness.  He is status post prostatectomy.  He has had recent colonoscopy.  Rectal exam not performed.  No significant lower extremity edema.  Neuro is intact without focal deficits.  Affect thought and judgment are normal.      Assessment & Plan:  Morbid obesity-his weight is in excess of 280 pounds.  His BMI is 41.94  History of prostatectomy for prostate cancer with no recurrence.  PSA less than 0.04  History of SVT/PAC and history of coronary artery disease currently without angina  Essential hypertension-stable on current regimen called Maxide 25 and amlodipine/benazepril (Lotrel) 10-20 milligrams daily  Mixed hyperlipidemia-continue Lipitor 10 mg daily  Rectal bleeding due to internal hemorrhoids-recent colonoscopy showed no active bleeding or tumor.  Hemoglobin stable at 12.1 g   Type 2 diabetes mellitus his hemoglobin A1c is stable at 6.5%.  Fasting glucose 116.  Continue Januvia  History of GE reflux treated with Nexium  History of gout treated with allopurinol  Chronic kidney disease stable creatinine at 1.37  Plan: Flu vaccine given.  Needs COVID booster in the near future.  Work  on diet exercise and weight loss.  Continue current medications.  Follow-up in 6 months.  Needs to walk more.   Subjective:   Patient presents for Medicare Annual/Subsequent preventive examination.  Review Past Medical/Family/Social: See above   Risk Factors  Current exercise habits: Walks some but could walk more Dietary issues discussed: Yes  Cardiac risk factors: Diabetes.  Morbid obesity, family history in both parents of  MIs  Depression Screen  (Note: if answer to either of the following is "Yes", a more complete depression screening is indicated)   Over the past two weeks, have you felt down, depressed or hopeless? No  Over the past two weeks, have you felt little interest or pleasure in doing things? No Have you lost interest or pleasure in daily life? No Do you often feel hopeless? No Do you cry easily over simple problems? No   Activities of Daily Living  In your present state of health, do you have any difficulty performing the following activities?:   Driving? No  Managing money? No  Feeding yourself? No  Getting from bed to chair? No  Climbing a flight of stairs? No  Preparing food and eating?: No  Bathing or showering? No  Getting dressed: No  Getting to the toilet? No  Using the toilet:No  Moving around from place to place: No  In the past year have you fallen or had a near fall?:No  Are you sexually active?  Yes Do you have more than one partner? No   Hearing Difficulties: No  Do you often ask people to speak up or repeat themselves? No  Do you experience ringing or noises in your ears? No  Do you have difficulty understanding soft or whispered voices? No  Do you feel that you have a problem with memory? No Do you often misplace items? No    Home Safety:  Do you have a smoke alarm at your residence? Yes Do you have grab bars in the bathroom?  None Do you have throw rugs in your house?  Yes   Cognitive Testing  Alert? Yes Normal Appearance?Yes  Oriented to person? Yes Place? Yes  Time? Yes  Recall of three objects? Yes  Can perform simple calculations? Yes  Displays appropriate judgment?Yes  Can read the correct time from a watch face?Yes   List the Names of Other Physician/Practitioners you currently use:  See referral list for the physicians patient is currently seeing.  Cardiology  Dr. Earlie Server  Dr. Tresa Moore   Review of Systems: See above   Objective:      General appearance: Appears stated age and morbidly obese  Head: Normocephalic, without obvious abnormality, atraumatic  Eyes: conj clear, EOMi PEERLA  Ears: normal TM's and external ear canals both ears  Nose: Nares normal. Septum midline. Mucosa normal. No drainage or sinus tenderness.  Throat: lips, mucosa, and tongue normal; teeth and gums normal  Neck: no adenopathy, no carotid bruit, no JVD, supple, symmetrical, trachea midline and thyroid not enlarged, symmetric, no tenderness/mass/nodules  No CVA tenderness.  Lungs: clear to auscultation bilaterally  Breasts: normal appearance, no masses or tenderness Heart: regular rate and rhythm, S1, S2 normal, no murmur, click, rub or gallop  Abdomen: soft, non-tender; bowel sounds normal; no masses, no organomegaly  Musculoskeletal: ROM normal in all joints, no crepitus, no deformity, Normal muscle strengthen. Back  is symmetric, no curvature. Skin: Skin color, texture, turgor normal. No rashes or lesions  Lymph nodes: Cervical, supraclavicular, and axillary nodes normal.  Neurologic: CN 2 -12 Normal, Normal symmetric reflexes. Normal coordination and gait  Psych: Alert & Oriented x 3, Mood appear stable.    Assessment:    Annual wellness medicare exam   Plan:    During the course of the visit the patient was educated and counseled about appropriate screening and preventive services including:  Flu vaccine given  Get COVID booster in the near future  Colonoscopy up-to-date      Patient Instructions (the written plan) was given to the patient.  Medicare Attestation  I have personally reviewed:  The patient's medical and social history  Their use of alcohol, tobacco or illicit drugs  Their current medications and supplements  The patient's functional ability including ADLs,fall risks, home safety risks, cognitive, and hearing and visual impairment  Diet and physical activities  Evidence for depression or mood disorders   The patient's weight, height, BMI, and visual acuity have been recorded in the chart. I have made referrals, counseling, and provided education to the patient based on review of the above and I have provided the patient with a written personalized care plan for preventive services.

## 2021-08-10 NOTE — Progress Notes (Signed)
[place]

## 2021-08-17 ENCOUNTER — Other Ambulatory Visit: Payer: Self-pay | Admitting: Internal Medicine

## 2021-11-13 ENCOUNTER — Other Ambulatory Visit: Payer: Self-pay | Admitting: Internal Medicine

## 2021-11-13 DIAGNOSIS — D649 Anemia, unspecified: Secondary | ICD-10-CM

## 2021-11-17 ENCOUNTER — Encounter: Payer: Self-pay | Admitting: Internal Medicine

## 2021-11-30 ENCOUNTER — Other Ambulatory Visit: Payer: Self-pay

## 2021-11-30 ENCOUNTER — Encounter: Payer: Self-pay | Admitting: Pulmonary Disease

## 2021-11-30 ENCOUNTER — Ambulatory Visit (INDEPENDENT_AMBULATORY_CARE_PROVIDER_SITE_OTHER): Payer: Medicare Other | Admitting: Pulmonary Disease

## 2021-11-30 VITALS — BP 124/68 | HR 82 | Temp 98.8°F | Ht 69.0 in | Wt 282.0 lb

## 2021-11-30 DIAGNOSIS — G4733 Obstructive sleep apnea (adult) (pediatric): Secondary | ICD-10-CM

## 2021-11-30 NOTE — Progress Notes (Signed)
[facility]  Chief Complaint  Patient presents with   Follow-up    1 year follow up for cpap. Pt states that cpap machine is working well for him. Pt states he wears it for 4-5 hours every night     Past Surgical History:  He  has a past surgical history that includes Knee surgery (1999); Hemorrhoid surgery; Robot assisted laparoscopic radical prostatectomy (N/A, 05/01/2014); Lymphadenectomy (Bilateral, 05/01/2014); Esophagogastroduodenoscopy; GI prostate biopsy; Incisional hernia repair (N/A, 08/07/2015); Esophagogastroduodenoscopy (egd) with propofol (N/A, 11/04/2017); Mass excision (Right, 08/28/2020); Colonoscopy; and Upper gastrointestinal endoscopy.  Past Medical History:  CAD, HTN, HLD, Diverticulosis, GERD, HH, Prostate cancer, DM, Gout  Constitutional:  BP 124/68 (BP Location: Left Arm, Patient Position: Sitting, Cuff Size: Normal)    Pulse 82    Temp 98.8 F (37.1 C) (Oral)    Ht 5\' 9"  (1.753 m)    Wt 282 lb (127.9 kg)    SpO2 98%    BMI 41.64 kg/m   Brief Summary:  Trice Aspinall. is a 76 y.o. male with obstructive sleep apnea.      Subjective:   He uses CPAP nightly.  No issues with mask fit.  Not having sinus congestion or dry mouth.  He sleeps for about 4 hours at night.  He wakes up a couple times to use the bathroom.  He will fall asleep a couple times during the day.  He feels okay otherwise.  Physical Exam:   Appearance - well kempt   ENMT - no sinus tenderness, no oral exudate, no LAN, Mallampati 3 airway, no stridor  Respiratory - equal breath sounds bilaterally, no wheezing or rales  CV - s1s2 regular rate and rhythm, no murmurs  Ext - no clubbing, no edema  Skin - no rashes  Psych - normal mood and affect   Sleep Tests:  PSG 10/19/07 >> AHI 29 CPAP 09/12/20 to 10/11/20 >> used on 29 of 30 nights with average 4 hrs 23 min.  Average AHI 5 with CPAP 11 cm H2O  Cardiac Tests:  Echo 08/27/11 >> EF 55 to  65%, mild MR, PAS 32 mmHg  Social History:  He  reports that he has never smoked. He has never used smokeless tobacco. He reports that he does not drink alcohol and does not use drugs.  Family History:  His family history includes Diabetes in his brother and mother; Heart attack in his father.     Assessment/Plan:   Obstructive sleep apnea. - he is compliant with CPAP and reports benefit from therapy - he uses [employer] for his DME - continue CPAP 11 cm H2O - will call him with results of his download  Poor sleep hygiene. - reviewed maintaining a regular sleep-wake schedule, and to avoid sleeping during the day   Obesity. - he is aware of how his weight can impact his health, particularly in relation to his sleep apnea  Time Spent Involved in Patient Care on Day of Examination:  27 minutes  Follow up:   Patient Instructions  Will call you with results of your CPAP download  Follow up in 1 year  Medication List:   Allergies as of 11/30/2021       Reactions   Dicloxacillin Rash, Other (See Comments)   Has patient had a PCN reaction causing immediate rash, facial/tongue/throat swelling, SOB or lightheadedness with hypotension: No Has patient had a PCN reaction causing severe rash involving mucus membranes or skin necrosis:  No Has patient had a PCN reaction that required hospitalization: No Has patient had a PCN reaction occurring within the last 10 years: No If all of the above answers are "NO", then may proceed with Cephalosporin use.   Cayenne Pepper [cayenne] Swelling   Nsaids Other (See Comments)   PT declines Acute renal failure 08/09/2015        Medication List        Accurate as of November 30, 2021  4:56 PM. If you have any questions, ask your nurse or doctor.          allopurinol 300 MG tablet Commonly known as: ZYLOPRIM TAKE 1 TABLET BY MOUTH DAILY TO PREVENT GOUT   amLODipine-benazepril 10-20 MG capsule Commonly known as: LOTREL TAKE 1 CAPSULE BY  MOUTH EVERY DAY   aspirin 81 MG tablet Take 81 mg by mouth daily.   atorvastatin 10 MG tablet Commonly known as: LIPITOR TAKE 1 TABLET BY MOUTH EVERY DAY   esomeprazole 20 MG capsule Commonly known as: NEXIUM Take 20 mg by mouth daily at 12 noon.   ferrous gluconate 240 (27 FE) MG tablet Commonly known as: FERGON Take 240 mg by mouth daily.   Folivane-Plus Caps TAKE 1 CAPSULE BY MOUTH EVERY DAY IN THE MORNING   Januvia 100 MG tablet Generic drug: sitaGLIPtin TAKE 1 TABLET BY MOUTH EVERY DAY   metoprolol succinate 25 MG 24 hr tablet Commonly known as: Toprol XL Take 0.5 tablets (12.5 mg total) by mouth daily.   nitroGLYCERIN 0.4 MG SL tablet Commonly known as: NITROSTAT Place 1 tablet (0.4 mg total) under the tongue every 5 (five) minutes as needed for chest pain. Reported on 03/04/2016   triamterene-hydrochlorothiazide 37.5-25 MG tablet Commonly known as: MAXZIDE-25 TAKE 1 TABLET BY MOUTH EVERY DAY        Signature:  Chesley Mires, MD [place] Pager - [phone] 11/30/2021, 4:56 PM

## 2021-11-30 NOTE — Patient Instructions (Signed)
Will call you with results of your CPAP download  Follow up in 1 year

## 2021-12-07 ENCOUNTER — Telehealth: Payer: Self-pay | Admitting: Pulmonary Disease

## 2021-12-07 DIAGNOSIS — Z9989 Dependence on other enabling machines and devices: Secondary | ICD-10-CM

## 2021-12-07 DIAGNOSIS — G4733 Obstructive sleep apnea (adult) (pediatric): Secondary | ICD-10-CM

## 2021-12-07 NOTE — Telephone Encounter (Signed)
CPAP 01/19/21 to 02/17/21 >> used on 26 of 30 nights with average 3 hrs 37 min.  Average AHI 7 with CPAP 11 cm H2O.   Please let him know that his download shows that his sleep apnea number is slightly elevated.  If he is agreeable, then please send order to [facility] to have his CPAP increased to 12 cm H2O.

## 2021-12-07 NOTE — Telephone Encounter (Signed)
Called and spoke to patient regarding CPAP download report. Patient voiced understanding and is agreeable to pressure increase.

## 2021-12-31 DIAGNOSIS — Z111 Encounter for screening for respiratory tuberculosis: Secondary | ICD-10-CM | POA: Diagnosis not present

## 2021-12-31 DIAGNOSIS — Z201 Contact with and (suspected) exposure to tuberculosis: Secondary | ICD-10-CM | POA: Diagnosis not present

## 2021-12-31 DIAGNOSIS — Z113 Encounter for screening for infections with a predominantly sexual mode of transmission: Secondary | ICD-10-CM | POA: Diagnosis not present

## 2022-01-18 ENCOUNTER — Other Ambulatory Visit: Payer: Self-pay | Admitting: Physician Assistant

## 2022-01-18 DIAGNOSIS — D509 Iron deficiency anemia, unspecified: Secondary | ICD-10-CM

## 2022-01-19 ENCOUNTER — Inpatient Hospital Stay: Payer: Medicare Other

## 2022-01-19 ENCOUNTER — Other Ambulatory Visit: Payer: Self-pay

## 2022-01-19 ENCOUNTER — Encounter: Payer: Self-pay | Admitting: Internal Medicine

## 2022-01-19 ENCOUNTER — Inpatient Hospital Stay: Payer: Medicare Other | Attending: Internal Medicine | Admitting: Internal Medicine

## 2022-01-19 VITALS — BP 128/63 | HR 76 | Temp 97.4°F | Resp 17 | Wt 283.2 lb

## 2022-01-19 DIAGNOSIS — D508 Other iron deficiency anemias: Secondary | ICD-10-CM

## 2022-01-19 DIAGNOSIS — D472 Monoclonal gammopathy: Secondary | ICD-10-CM | POA: Insufficient documentation

## 2022-01-19 DIAGNOSIS — Z79899 Other long term (current) drug therapy: Secondary | ICD-10-CM | POA: Insufficient documentation

## 2022-01-19 DIAGNOSIS — Z8546 Personal history of malignant neoplasm of prostate: Secondary | ICD-10-CM | POA: Insufficient documentation

## 2022-01-19 DIAGNOSIS — D509 Iron deficiency anemia, unspecified: Secondary | ICD-10-CM

## 2022-01-19 LAB — CBC WITH DIFFERENTIAL (CANCER CENTER ONLY)
Abs Immature Granulocytes: 0.04 10*3/uL (ref 0.00–0.07)
Basophils Absolute: 0 10*3/uL (ref 0.0–0.1)
Basophils Relative: 0 %
Eosinophils Absolute: 0.2 10*3/uL (ref 0.0–0.5)
Eosinophils Relative: 2 %
HCT: 35.6 % — ABNORMAL LOW (ref 39.0–52.0)
Hemoglobin: 11.7 g/dL — ABNORMAL LOW (ref 13.0–17.0)
Immature Granulocytes: 0 %
Lymphocytes Relative: 28 %
Lymphs Abs: 3.2 10*3/uL (ref 0.7–4.0)
MCH: 31 pg (ref 26.0–34.0)
MCHC: 32.9 g/dL (ref 30.0–36.0)
MCV: 94.4 fL (ref 80.0–100.0)
Monocytes Absolute: 0.7 10*3/uL (ref 0.1–1.0)
Monocytes Relative: 6 %
Neutro Abs: 7.2 10*3/uL (ref 1.7–7.7)
Neutrophils Relative %: 64 %
Platelet Count: 277 10*3/uL (ref 150–400)
RBC: 3.77 MIL/uL — ABNORMAL LOW (ref 4.22–5.81)
RDW: 14.4 % (ref 11.5–15.5)
WBC Count: 11.4 10*3/uL — ABNORMAL HIGH (ref 4.0–10.5)
nRBC: 0 % (ref 0.0–0.2)

## 2022-01-19 LAB — IRON AND IRON BINDING CAPACITY (CC-WL,HP ONLY)
Iron: 43 ug/dL — ABNORMAL LOW (ref 45–182)
Saturation Ratios: 13 % — ABNORMAL LOW (ref 17.9–39.5)
TIBC: 322 ug/dL (ref 250–450)
UIBC: 279 ug/dL (ref 117–376)

## 2022-01-19 LAB — FERRITIN: Ferritin: 143 ng/mL (ref 24–336)

## 2022-01-19 NOTE — Progress Notes (Signed)
?    [place] ?[phone]   [phone] ? ?OFFICE PROGRESS NOTE ? ?[name], MD ?[address] ?[place] [address] ? ?DIAGNOSIS:  ?1) monoclonal gammopathy of undetermined significance.  ?2) iron deficiency anemia.  ?3) prostate adenocarcinoma, with Gleason score of 4+4 equals 8 involving both prostate lobes with lymphovascular invasion and diffuse perineural invasion status post prostatectomy under the care of Dr. Tresa Moore.  ? ?PRIOR THERAPY: Feraheme infusion x 2 doses, last dose was given in October of 2014. ? ?CURRENT THERAPY: Folivane-Plus 1 capsule p.o. daily. ? ?INTERVAL HISTORY: ?Leonard [name]. 76 y.o. male returns to the clinic today for 61-monthfollow-up visit.  The patient has no complaints today.  He denied having any fatigue or weakness.  He denied having any chest pain but has shortness of breath with exertion with no cough or hemoptysis.  He has no nausea, vomiting, diarrhea or constipation.  He has no headache or visual changes.  He denied having any recent weight loss or night sweats.  He continues to tolerate his treatment with FLavone Ornplus daily fairly well.  The patient is here today for evaluation and repeat CBC, iron study and ferritin. ? ? ?MEDICAL HISTORY: ?Past Medical History:  ?Diagnosis Date  ? 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 EGD  ? CKD (chronic kidney disease)   ? Coronary artery disease   ? Diverticulosis of colon (without mention of hemorrhage) 09/05/2006  ? Colonoscopy   ? Gastric polyp 2014  ? [facility] ? GERD (gastroesophageal reflux disease) 07/26/2001  ? EGD(Chronic)  ? Gouty arthropathy   ? Hemorrhoids   ? Hiatal hernia 09/05/2006  ? EGD  ? History of kidney stones   ? Hx of colonic polyps 09/05/2006  ? Colonoscopy(ADENOMATOUS POLYP)  ? Iron deficiency anemia, unspecified   ? Monoclonal gammopathy   ? OSA (obstructive sleep apnea)   ? USES CPAP MACHINE   ? Other and unspecified  hyperlipidemia   ? [place]  ? Type II or unspecified type diabetes mellitus without mention of complication, not stated as uncontrolled   ? Unspecified essential hypertension   ? ? ?ALLERGIES:  is allergic to dicloxacillin, cayenne pepper [cayenne], and nsaids. ? ?MEDICATIONS:  ?Current Outpatient Medications  ?Medication Sig Dispense Refill  ? allopurinol (ZYLOPRIM) 300 MG tablet TAKE 1 TABLET BY MOUTH DAILY TO PREVENT GOUT 90 tablet 3  ? amLODipine-benazepril (LOTREL) 10-20 MG capsule TAKE 1 CAPSULE BY MOUTH EVERY DAY 90 capsule 3  ? aspirin 81 MG tablet Take 81 mg by mouth daily.    ? atorvastatin (LIPITOR) 10 MG tablet TAKE 1 TABLET BY MOUTH EVERY DAY 90 tablet 2  ? esomeprazole (NEXIUM) 20 MG capsule Take 20 mg by mouth daily at 12 noon.    ? FeFum-FePoly-FA-B Cmp-C-Biot (FOLIVANE-PLUS) CAPS TAKE 1 CAPSULE BY MOUTH EVERY DAY IN THE MORNING 90 capsule 1  ? ferrous gluconate (FERGON) 240 (27 FE) MG tablet Take 240 mg by mouth daily.    ? JANUVIA 100 MG tablet TAKE 1 TABLET BY MOUTH EVERY DAY 90 tablet 1  ? metoprolol succinate (TOPROL XL) 25 MG 24 hr tablet Take 0.5 tablets (12.5 mg total) by mouth daily. 45 tablet 3  ? nitroGLYCERIN (NITROSTAT) 0.4 MG SL tablet Place 1 tablet (0.4 mg total) under the tongue every 5 (five) minutes as needed for chest pain. Reported on 03/04/2016 30 tablet 5  ? triamterene-hydrochlorothiazide (MAXZIDE-25) 37.5-25 MG tablet  TAKE 1 TABLET BY MOUTH EVERY DAY 90 tablet 3  ? ?No current facility-administered medications for this visit.  ? ? ?SURGICAL HISTORY:  ?Past Surgical History:  ?Procedure Laterality Date  ? COLONOSCOPY    ? ESOPHAGOGASTRODUODENOSCOPY    ? Done at [facility]  ? ESOPHAGOGASTRODUODENOSCOPY (EGD) WITH PROPOFOL N/A 11/04/2017  ? Procedure: ESOPHAGOGASTRODUODENOSCOPY (EGD) WITH PROPOFOL;  Surgeon: Jerene Bears, MD;  Location: [facility];  Service: Gastroenterology;  Laterality: N/A;  ? GI PROSTATE BIOPSY    ? HEMORRHOID SURGERY    ? INCISIONAL HERNIA  REPAIR N/A 08/07/2015  ? Procedure: LAPAROSCOPIC INCISIONAL HERNIA repair with mesh;  Surgeon: Arta Bruce Kinsinger, MD;  Location: [facility];  Service: General;  Laterality: N/A;  ? [place]  ? TUMOR REMOVED LEFT KNEE & ARTHROSCOPIC SURGERY  ? LYMPHADENECTOMY Bilateral 05/01/2014  ? Procedure: LYMPHADENECTOMY;  Surgeon: Alexis Frock, MD;  Location: [facility];  Service: Urology;  Laterality: Bilateral;  ? MASS EXCISION Right 08/28/2020  ? Procedure: EXCISION RIGHT SHOULDER MASS;  Surgeon: Stark Klein, MD;  Location: [place];  Service: General;  Laterality: Right;  ? ROBOT ASSISTED LAPAROSCOPIC RADICAL PROSTATECTOMY N/A 05/01/2014  ? Procedure: ROBOTIC ASSISTED LAPAROSCOPIC RADICAL PROSTATECTOMY WITH INDOCYANINE GREEN DYE;  Surgeon: Alexis Frock, MD;  Location: [facility];  Service: Urology;  Laterality: N/A;  ? UPPER GASTROINTESTINAL ENDOSCOPY    ? ? ?REVIEW OF SYSTEMS:  A comprehensive review of systems was negative.  ? ?PHYSICAL EXAMINATION: General appearance: alert, cooperative, and no distress ?Head: Normocephalic, without obvious abnormality, atraumatic ?Neck: no adenopathy ?Lymph nodes: Cervical, supraclavicular, and axillary nodes normal. ?Resp: clear to auscultation bilaterally ?Back: symmetric, no curvature. ROM normal. No CVA tenderness. ?Cardio: irregularly irregular rhythm ?GI: soft, non-tender; bowel sounds normal; no masses,  no organomegaly ?Extremities: extremities normal, atraumatic, no cyanosis or edema ? ?ECOG PERFORMANCE STATUS: 1 - Symptomatic but completely ambulatory ? ?Blood pressure 128/63, pulse 76, temperature (!) 97.4 ?F (36.3 ?C), temperature source Tympanic, resp. rate 17, weight 283 lb 3 oz (128.5 kg), SpO2 96 %. ? ?LABORATORY DATA: ?Lab Results  ?Component Value Date  ? WBC 11.4 (H) 01/19/2022  ? HGB 11.7 (L) 01/19/2022  ? HCT 35.6 (L) 01/19/2022  ? MCV 94.4 01/19/2022  ? PLT 277 01/19/2022  ? ? ?  Chemistry   ?   ?Component Value Date/Time  ? NA 140 07/21/2021 0910  ? NA 137  06/24/2021 1227  ? NA 140 09/14/2017 0928  ? K 4.1 07/21/2021 0910  ? K 4.0 09/14/2017 0928  ? CL 104 07/21/2021 0910  ? CL 104 04/17/2013 0857  ? CO2 24 07/21/2021 0910  ? CO2 27 09/14/2017 0928  ? BUN 26 (H) 07/21/2021 0910  ? BUN 30 (H) 06/24/2021 1227  ? BUN 28.3 (H) 09/14/2017 8099  ? CREATININE 1.37 (H) 07/21/2021 0910  ? CREATININE 1.4 (H) 09/14/2017 8338  ?    ?Component Value Date/Time  ? CALCIUM 9.8 07/21/2021 0910  ? CALCIUM 10.1 09/14/2017 0928  ? ALKPHOS 87 07/14/2021 1006  ? ALKPHOS 80 09/14/2017 0928  ? AST 16 07/21/2021 0910  ? AST 16 07/14/2021 1006  ? AST 17 09/14/2017 0928  ? ALT 18 07/21/2021 0910  ? ALT 19 07/14/2021 1006  ? ALT 18 09/14/2017 0928  ? BILITOT 0.5 07/21/2021 0910  ? BILITOT 0.4 07/14/2021 1006  ? BILITOT 0.45 09/14/2017 0928  ?  ? ?RADIOGRAPHIC STUDIES: ?No results found. ? ?ASSESSMENT AND PLAN:  ?This is a very pleasant 76 years old  African-American male with iron deficiency anemia secondary to history of gastrointestinal blood loss.  ?The patient also has a history of MGUS and currently on observation. ?The patient is currently on treatment with the Folivane-Plus and he is tolerating it fairly well. ?Repeat CBC today showed stable mild anemia.  Iron study and ferritin are still pending. ?I recommended for him to continue his current treatment with the iron capsules. ?I will see him back for follow-up visit in 6 months for evaluation with repeat iron study as well as myeloma panel. ?He was advised to call immediately if he has any other concerning symptoms in the interval. ?The patient voices understanding of current disease status and treatment options and is in agreement with the current care plan. ?All questions were answered. The patient knows to call the clinic with any problems, questions or concerns. We can certainly see the patient much sooner if necessary. ? ? ?Disclaimer: This note was dictated with voice recognition software. Similar sounding words can inadvertently be  transcribed and may not be corrected upon review. ? ?  ?  ?

## 2022-02-02 ENCOUNTER — Other Ambulatory Visit: Payer: Medicare Other

## 2022-02-02 ENCOUNTER — Other Ambulatory Visit: Payer: Self-pay

## 2022-02-02 DIAGNOSIS — E785 Hyperlipidemia, unspecified: Secondary | ICD-10-CM

## 2022-02-02 DIAGNOSIS — E119 Type 2 diabetes mellitus without complications: Secondary | ICD-10-CM

## 2022-02-02 DIAGNOSIS — E1169 Type 2 diabetes mellitus with other specified complication: Secondary | ICD-10-CM

## 2022-02-03 DIAGNOSIS — C61 Malignant neoplasm of prostate: Secondary | ICD-10-CM | POA: Diagnosis not present

## 2022-02-03 LAB — HEPATIC FUNCTION PANEL
AG Ratio: 1.3 (calc) (ref 1.0–2.5)
ALT: 15 U/L (ref 9–46)
AST: 17 U/L (ref 10–35)
Albumin: 4 g/dL (ref 3.6–5.1)
Alkaline phosphatase (APISO): 85 U/L (ref 35–144)
Bilirubin, Direct: 0.1 mg/dL (ref 0.0–0.2)
Globulin: 3 g/dL (calc) (ref 1.9–3.7)
Indirect Bilirubin: 0.5 mg/dL (calc) (ref 0.2–1.2)
Total Bilirubin: 0.6 mg/dL (ref 0.2–1.2)
Total Protein: 7 g/dL (ref 6.1–8.1)

## 2022-02-03 LAB — LIPID PANEL
Cholesterol: 146 mg/dL (ref <200)
HDL: 76 mg/dL (ref >=40)
LDL Cholesterol (Calc): 53 mg/dL (calc)
Non-HDL Cholesterol (Calc): 70 mg/dL (calc) (ref <130)
Total CHOL/HDL Ratio: 1.9 (calc) (ref <5.0)
Triglycerides: 86 mg/dL (ref <150)

## 2022-02-03 LAB — HEMOGLOBIN A1C
Hgb A1c MFr Bld: 6.2 % of total Hgb — ABNORMAL HIGH (ref <5.7)
Mean Plasma Glucose: 131 mg/dL
eAG (mmol/L): 7.3 mmol/L

## 2022-02-08 ENCOUNTER — Other Ambulatory Visit: Payer: Self-pay | Admitting: Internal Medicine

## 2022-02-09 ENCOUNTER — Encounter: Payer: Self-pay | Admitting: Internal Medicine

## 2022-02-09 ENCOUNTER — Ambulatory Visit (INDEPENDENT_AMBULATORY_CARE_PROVIDER_SITE_OTHER): Payer: Medicare Other | Admitting: Internal Medicine

## 2022-02-09 ENCOUNTER — Other Ambulatory Visit: Payer: Self-pay

## 2022-02-09 VITALS — BP 118/68 | HR 72 | Temp 97.2°F | Ht 69.0 in | Wt 282.0 lb

## 2022-02-09 DIAGNOSIS — N183 Chronic kidney disease, stage 3 unspecified: Secondary | ICD-10-CM

## 2022-02-09 DIAGNOSIS — Z8739 Personal history of other diseases of the musculoskeletal system and connective tissue: Secondary | ICD-10-CM

## 2022-02-09 DIAGNOSIS — Z8546 Personal history of malignant neoplasm of prostate: Secondary | ICD-10-CM | POA: Diagnosis not present

## 2022-02-09 DIAGNOSIS — Z6841 Body Mass Index (BMI) 40.0 and over, adult: Secondary | ICD-10-CM

## 2022-02-09 DIAGNOSIS — I10 Essential (primary) hypertension: Secondary | ICD-10-CM | POA: Diagnosis not present

## 2022-02-09 DIAGNOSIS — E785 Hyperlipidemia, unspecified: Secondary | ICD-10-CM

## 2022-02-09 DIAGNOSIS — D472 Monoclonal gammopathy: Secondary | ICD-10-CM

## 2022-02-09 DIAGNOSIS — E1169 Type 2 diabetes mellitus with other specified complication: Secondary | ICD-10-CM | POA: Diagnosis not present

## 2022-02-09 NOTE — Progress Notes (Signed)
? ?  Subjective:  ? ? Patient ID: Leonard [name]., male    DOB: 12/12/1945, 76 y.o.   MRN: [id number] ? ?HPI  [name] is a 76 year old Male seen for 6 month follow up.  He has been walking. Hgb AIC has improved to 6.2% from 6.5% . Lipid and liver functions are normal. ? ?3 tubular adenomas removed  on Colonoscopy in 2022. 3 year follow up recommended. ? ?Review of Systems patient will get tetanus vaccine at [facility] soon. ? ?Reminded about diabetic eye exam. ? ?Immunization record checked today. Traveling to [place] soon. ? ?He has a history of benign monoclonal gammopathy and is followed by Dr. Earlie [name]. ? ?In October 2021 he had a benign mass removed from his right shoulder area adjacent to the superior margin of the right trapezius muscle measuring 5.5 cm. ? ?History of prostate cancer status post robotic prostatectomy by Dr. Bess [name] in June 2015. ? ?In October 2020 he had an episode of acute pyelonephritis treated as an outpatient. ? ?History of gout. ? ?History of tinea versicolor. ? ?History of peripheral neuropathy. ? ?Longstanding history of GE reflux treated with PPI. ? ?Social history: He is married.  He is retired from the [employer].  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  Wife is retired from the [employer] as well.  He does not smoke or consume alcohol. ? ?He has essential hypertension, type 2 diabetes mellitus, obesity, metabolic syndrome, obstructive sleep apnea in addition to hyperlipidemia.  History of PACs. ? ?   ?Objective:  ? Physical Exam ?Blood pressure 118/68 pulse 72 temperature 97.2 degrees pulse oximetry 97% weight 282 pounds height 5 feet 9 inches BMI 41.64 ? ?Skin: Warm and dry.  No cervical adenopathy.  No thyromegaly.  Chest is clear to auscultation without rales or wheezing.  Cardiac exam: Regular rate and rhythm without ectopy.  No lower extremity pitting edema. ? ?Hemoglobin A1c is 6.2%.  Lipid and liver functions are completely normal. ? ?   ?Assessment & Plan:   ? ?Essential hypertension-stable on current regimen of Maxide 25 and Lotrel 10/20 daily. ? ?Type 2 diabetes mellitus-stable.  Take Januvia 100 mg daily..  Also I would like him to continue to take low-dose statin despite lipids being normal for a number of years ? ? ?History of prostatectomy for prostate cancer and followed by urology ? ?BMI 41.64 continue to encourage diet exercise and weight loss ? ?History of GE reflux treated with Nexium ? ?Chronic kidney disease-stable creatinine 1.37 from September 2022. ? ?History of gout treated with allopurinol ?

## 2022-02-12 DIAGNOSIS — C61 Malignant neoplasm of prostate: Secondary | ICD-10-CM | POA: Diagnosis not present

## 2022-02-12 DIAGNOSIS — N393 Stress incontinence (female) (male): Secondary | ICD-10-CM | POA: Diagnosis not present

## 2022-02-23 DIAGNOSIS — Z0289 Encounter for other administrative examinations: Secondary | ICD-10-CM

## 2022-03-03 DIAGNOSIS — Z20822 Contact with and (suspected) exposure to covid-19: Secondary | ICD-10-CM | POA: Diagnosis not present

## 2022-03-12 DIAGNOSIS — Z20822 Contact with and (suspected) exposure to covid-19: Secondary | ICD-10-CM | POA: Diagnosis not present

## 2022-03-13 NOTE — Patient Instructions (Addendum)
It was a pleasure to see you today.  You have an upcoming trip to the [place].  Please get tetanus update at pharmacy prior to departure.  Continue current medications.  Return in 6 months or as needed.  Please continue to work on diet and exercise efforts. ?

## 2022-03-15 DIAGNOSIS — Z20822 Contact with and (suspected) exposure to covid-19: Secondary | ICD-10-CM | POA: Diagnosis not present

## 2022-03-18 DIAGNOSIS — Z20822 Contact with and (suspected) exposure to covid-19: Secondary | ICD-10-CM | POA: Diagnosis not present

## 2022-04-02 ENCOUNTER — Other Ambulatory Visit: Payer: Self-pay | Admitting: Cardiology

## 2022-04-19 ENCOUNTER — Telehealth: Payer: Self-pay | Admitting: Internal Medicine

## 2022-04-19 NOTE — Telephone Encounter (Signed)
Leonard [name] [phone]  [name] called to say he has a red spot on his back he wants you to look at, and his knees are given out. He would like to come in next week because he will be out of town this week at a conference, he is really worried about the spot the most.

## 2022-04-19 NOTE — Telephone Encounter (Signed)
Called patient to let him know to call ortho about knees and scheduled appointment about spot on back

## 2022-04-26 ENCOUNTER — Encounter: Payer: Self-pay | Admitting: Internal Medicine

## 2022-04-26 ENCOUNTER — Ambulatory Visit (INDEPENDENT_AMBULATORY_CARE_PROVIDER_SITE_OTHER): Payer: Medicare Other | Admitting: Internal Medicine

## 2022-04-26 VITALS — BP 122/64 | HR 84 | Temp 97.6°F | Resp 18 | Ht 69.0 in | Wt 285.8 lb

## 2022-04-26 DIAGNOSIS — E1169 Type 2 diabetes mellitus with other specified complication: Secondary | ICD-10-CM

## 2022-04-26 DIAGNOSIS — Z8739 Personal history of other diseases of the musculoskeletal system and connective tissue: Secondary | ICD-10-CM | POA: Diagnosis not present

## 2022-04-26 DIAGNOSIS — N183 Chronic kidney disease, stage 3 unspecified: Secondary | ICD-10-CM | POA: Diagnosis not present

## 2022-04-26 DIAGNOSIS — Z8546 Personal history of malignant neoplasm of prostate: Secondary | ICD-10-CM

## 2022-04-26 DIAGNOSIS — Z9079 Acquired absence of other genital organ(s): Secondary | ICD-10-CM | POA: Diagnosis not present

## 2022-04-26 DIAGNOSIS — L989 Disorder of the skin and subcutaneous tissue, unspecified: Secondary | ICD-10-CM

## 2022-04-26 DIAGNOSIS — Z86010 Personal history of colonic polyps: Secondary | ICD-10-CM

## 2022-04-26 DIAGNOSIS — E785 Hyperlipidemia, unspecified: Secondary | ICD-10-CM

## 2022-04-26 DIAGNOSIS — I10 Essential (primary) hypertension: Secondary | ICD-10-CM

## 2022-04-26 DIAGNOSIS — I2510 Atherosclerotic heart disease of native coronary artery without angina pectoris: Secondary | ICD-10-CM | POA: Diagnosis not present

## 2022-04-26 DIAGNOSIS — M170 Bilateral primary osteoarthritis of knee: Secondary | ICD-10-CM | POA: Diagnosis not present

## 2022-04-26 DIAGNOSIS — D472 Monoclonal gammopathy: Secondary | ICD-10-CM | POA: Diagnosis not present

## 2022-04-26 NOTE — Patient Instructions (Addendum)
     If you have lab work done today you will be contacted with your lab results within the next 2 weeks.  If you have not heard from [place] then please contact us. The fastest way to get your results is to register for My Chart.  Referral to [facility] orthopedics regarding bilateral knee arthritis.  Patient is to call for an appointment.  Referral to dermatologist regarding lesion on upper left back.  Patient is to call for appointment.   IF you received an x-ray today, you will receive an invoice from [facility]. Please contact [facility] at [phone] with questions or concerns regarding your invoice.    Our billing staff will not be able to assist you with questions regarding bills from these companies.  No labs were drawn today.

## 2022-04-26 NOTE — Progress Notes (Signed)
   Subjective:    Patient ID: Leonard [name]., male    DOB: [birth year], 76 y.o.   MRN: [id number]  HPI 76 year old Male seen for 2 reasons today.  He had nice trip to [place] recently but knees were bothering him and it was hard for him to walk any distance. Has been seen at [facility] in the remote past by Dr. Amada Jupiter.   Patient given phone number and is to call that office regarding knee pain.  He Could easily have  steroid injections of knees but cannot be on chronic NSAID therapy due to Chronic Kidney Disease.  He has a history of benign monoclonal gammopathy and is followed by Dr. Earlie Server.  He is overweight.  He has essential hypertension, type 2 diabetes, obesity, metabolic syndrome, obstructive sleep apnea and hyperlipidemia.  Also history of PACs.  History of tubular adenomas being removed June 2022 by Dr. Hilarie Fredrickson with 3-year follow-up recommended.  History of internal hemorrhoids that bleed intermittently.  Had benign mass removed from right shoulder area by Dr. Barry Dienes in October 2021.  Remote history of prostate cancer status post robotic prostatectomy June 2015 by Dr. Tresa Moore  In October 2020 he had an episode of acute pyelonephritis treated as an outpatient.  Followed by Cardiology for coronary artery disease and had minimal coronary disease on catheterization in 2007.  History of gout.  History of peripheral neuropathy.  Longstanding history of GE reflux treated with PPI.  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  He retired from the [employer].  He is married.  Does not smoke or consume alcohol.  Family history: Father died of an MI.  Mother died of an MI.  2 brothers with history of diabetes.  1 sister.  Mother had diabetes and both parents had hypertension.       Review of Systems wife is concerned about a discolored area that she noticed when he was on his cruise in the [place] adjacent to his thoracic spine  area on the left.  It does not itch.  Patient was unaware of this area.     Objective:   Physical Exam Vital signs reviewed.  He is afebrile.  Blood pressure is 122/64 Weight is 285 pounds 12.8 ounces ,BMI 42.21  There is a fairly large circular area to the left of his thoracic spine that is discolored and macular.  Do not see schedule or erythema.  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  He does have diabetes and is on Januvia but this is probably the best option.  He cannot be on insulin therapy due to chronic kidney disease.  History of monoclonal gammopathy followed by Dr. Jerold Coombe macular lesion left parathoracic area-referred to Dermatology for evaluation.  This is asymptomatic according to the patient.  He is not sure how long it has been there and his wife just noticed it while they were on a cruise  Essential hypertension-stable on current medication  Type 2 diabetes mellitus- stable  Obesity-BMI 42.21  Remote history of prostate cancer  History of hyperlipidemia  History of gout

## 2022-04-28 DIAGNOSIS — M170 Bilateral primary osteoarthritis of knee: Secondary | ICD-10-CM | POA: Diagnosis not present

## 2022-05-04 ENCOUNTER — Encounter (HOSPITAL_BASED_OUTPATIENT_CLINIC_OR_DEPARTMENT_OTHER): Payer: Self-pay | Admitting: Obstetrics and Gynecology

## 2022-05-04 ENCOUNTER — Encounter: Payer: Self-pay | Admitting: Internal Medicine

## 2022-05-04 ENCOUNTER — Other Ambulatory Visit: Payer: Self-pay

## 2022-05-04 ENCOUNTER — Emergency Department (HOSPITAL_BASED_OUTPATIENT_CLINIC_OR_DEPARTMENT_OTHER)
Admission: EM | Admit: 2022-05-04 | Discharge: 2022-05-04 | Disposition: A | Discharge status: Home / Self Care | Payer: No Typology Code available for payment source | Attending: Emergency Medicine | Admitting: Emergency Medicine

## 2022-05-04 DIAGNOSIS — I129 Hypertensive chronic kidney disease with stage 1 through stage 4 chronic kidney disease, or unspecified chronic kidney disease: Secondary | ICD-10-CM | POA: Insufficient documentation

## 2022-05-04 DIAGNOSIS — G44301 Post-traumatic headache, unspecified, intractable: Secondary | ICD-10-CM | POA: Insufficient documentation

## 2022-05-04 DIAGNOSIS — I2510 Atherosclerotic heart disease of native coronary artery without angina pectoris: Secondary | ICD-10-CM | POA: Insufficient documentation

## 2022-05-04 DIAGNOSIS — E1122 Type 2 diabetes mellitus with diabetic chronic kidney disease: Secondary | ICD-10-CM | POA: Insufficient documentation

## 2022-05-04 DIAGNOSIS — Z7982 Long term (current) use of aspirin: Secondary | ICD-10-CM | POA: Diagnosis not present

## 2022-05-04 DIAGNOSIS — Z79899 Other long term (current) drug therapy: Secondary | ICD-10-CM | POA: Insufficient documentation

## 2022-05-04 DIAGNOSIS — Y92410 Unspecified street and highway as the place of occurrence of the external cause: Secondary | ICD-10-CM | POA: Insufficient documentation

## 2022-05-04 DIAGNOSIS — N189 Chronic kidney disease, unspecified: Secondary | ICD-10-CM | POA: Diagnosis not present

## 2022-05-04 DIAGNOSIS — G44319 Acute post-traumatic headache, not intractable: Secondary | ICD-10-CM | POA: Diagnosis not present
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
## 2022-05-16 ENCOUNTER — Other Ambulatory Visit: Payer: Self-pay | Admitting: Physician Assistant

## 2022-05-16 DIAGNOSIS — D649 Anemia, unspecified: Secondary | ICD-10-CM

## 2022-05-19 DIAGNOSIS — D1801 Hemangioma of skin and subcutaneous tissue: Secondary | ICD-10-CM | POA: Diagnosis not present

## 2022-05-19 DIAGNOSIS — L989 Disorder of the skin and subcutaneous tissue, unspecified: Secondary | ICD-10-CM | POA: Diagnosis not present

## 2022-06-16 DIAGNOSIS — M170 Bilateral primary osteoarthritis of knee: Secondary | ICD-10-CM | POA: Diagnosis not present

## 2022-06-22 DIAGNOSIS — M6281 Muscle weakness (generalized): Secondary | ICD-10-CM | POA: Diagnosis not present

## 2022-06-22 DIAGNOSIS — M1711 Unilateral primary osteoarthritis, right knee: Secondary | ICD-10-CM | POA: Diagnosis not present

## 2022-06-22 DIAGNOSIS — M1712 Unilateral primary osteoarthritis, left knee: Secondary | ICD-10-CM | POA: Diagnosis not present

## 2022-06-22 DIAGNOSIS — R262 Difficulty in walking, not elsewhere classified: Secondary | ICD-10-CM | POA: Diagnosis not present

## 2022-06-24 DIAGNOSIS — M79642 Pain in left hand: Secondary | ICD-10-CM | POA: Diagnosis not present

## 2022-06-28 DIAGNOSIS — M170 Bilateral primary osteoarthritis of knee: Secondary | ICD-10-CM | POA: Diagnosis not present

## 2022-06-29 DIAGNOSIS — M1712 Unilateral primary osteoarthritis, left knee: Secondary | ICD-10-CM | POA: Diagnosis not present

## 2022-06-29 DIAGNOSIS — R262 Difficulty in walking, not elsewhere classified: Secondary | ICD-10-CM | POA: Diagnosis not present

## 2022-06-29 DIAGNOSIS — M6281 Muscle weakness (generalized): Secondary | ICD-10-CM | POA: Diagnosis not present

## 2022-06-29 DIAGNOSIS — M1711 Unilateral primary osteoarthritis, right knee: Secondary | ICD-10-CM | POA: Diagnosis not present

## 2022-07-05 DIAGNOSIS — M170 Bilateral primary osteoarthritis of knee: Secondary | ICD-10-CM | POA: Diagnosis not present

## 2022-07-08 DIAGNOSIS — R262 Difficulty in walking, not elsewhere classified: Secondary | ICD-10-CM | POA: Diagnosis not present

## 2022-07-08 DIAGNOSIS — M6281 Muscle weakness (generalized): Secondary | ICD-10-CM | POA: Diagnosis not present

## 2022-07-08 DIAGNOSIS — M1711 Unilateral primary osteoarthritis, right knee: Secondary | ICD-10-CM | POA: Diagnosis not present

## 2022-07-08 DIAGNOSIS — M1712 Unilateral primary osteoarthritis, left knee: Secondary | ICD-10-CM | POA: Diagnosis not present

## 2022-07-12 DIAGNOSIS — M170 Bilateral primary osteoarthritis of knee: Secondary | ICD-10-CM | POA: Diagnosis not present

## 2022-07-15 DIAGNOSIS — M6281 Muscle weakness (generalized): Secondary | ICD-10-CM | POA: Diagnosis not present

## 2022-07-15 DIAGNOSIS — R262 Difficulty in walking, not elsewhere classified: Secondary | ICD-10-CM | POA: Diagnosis not present

## 2022-07-15 DIAGNOSIS — M1711 Unilateral primary osteoarthritis, right knee: Secondary | ICD-10-CM | POA: Diagnosis not present

## 2022-07-15 DIAGNOSIS — M1712 Unilateral primary osteoarthritis, left knee: Secondary | ICD-10-CM | POA: Diagnosis not present

## 2022-07-20 ENCOUNTER — Other Ambulatory Visit: Payer: Self-pay

## 2022-07-20 ENCOUNTER — Inpatient Hospital Stay: Payer: Medicare Other | Attending: Internal Medicine

## 2022-07-20 DIAGNOSIS — D472 Monoclonal gammopathy: Secondary | ICD-10-CM | POA: Insufficient documentation

## 2022-07-20 DIAGNOSIS — Z79899 Other long term (current) drug therapy: Secondary | ICD-10-CM | POA: Insufficient documentation

## 2022-07-20 DIAGNOSIS — C61 Malignant neoplasm of prostate: Secondary | ICD-10-CM | POA: Diagnosis not present

## 2022-07-20 DIAGNOSIS — Z9079 Acquired absence of other genital organ(s): Secondary | ICD-10-CM | POA: Diagnosis not present

## 2022-07-20 DIAGNOSIS — D509 Iron deficiency anemia, unspecified: Secondary | ICD-10-CM | POA: Diagnosis not present

## 2022-07-20 DIAGNOSIS — Z7982 Long term (current) use of aspirin: Secondary | ICD-10-CM | POA: Insufficient documentation

## 2022-07-20 DIAGNOSIS — Z7984 Long term (current) use of oral hypoglycemic drugs: Secondary | ICD-10-CM | POA: Insufficient documentation

## 2022-07-20 LAB — CBC WITH DIFFERENTIAL (CANCER CENTER ONLY)
Abs Immature Granulocytes: 0.05 10*3/uL (ref 0.00–0.07)
Basophils Absolute: 0 10*3/uL (ref 0.0–0.1)
Basophils Relative: 0 %
Eosinophils Absolute: 0.1 10*3/uL (ref 0.0–0.5)
Eosinophils Relative: 1 %
HCT: 36.1 % — ABNORMAL LOW (ref 39.0–52.0)
Hemoglobin: 12.1 g/dL — ABNORMAL LOW (ref 13.0–17.0)
Immature Granulocytes: 1 %
Lymphocytes Relative: 29 %
Lymphs Abs: 3.2 10*3/uL (ref 0.7–4.0)
MCH: 31.1 pg (ref 26.0–34.0)
MCHC: 33.5 g/dL (ref 30.0–36.0)
MCV: 92.8 fL (ref 80.0–100.0)
Monocytes Absolute: 0.8 10*3/uL (ref 0.1–1.0)
Monocytes Relative: 7 %
Neutro Abs: 6.7 10*3/uL (ref 1.7–7.7)
Neutrophils Relative %: 62 %
Platelet Count: 286 10*3/uL (ref 150–400)
RBC: 3.89 MIL/uL — ABNORMAL LOW (ref 4.22–5.81)
RDW: 14.6 % (ref 11.5–15.5)
WBC Count: 10.9 10*3/uL — ABNORMAL HIGH (ref 4.0–10.5)
nRBC: 0 % (ref 0.0–0.2)

## 2022-07-20 LAB — CMP (CANCER CENTER ONLY)
ALT: 17 U/L (ref 0–44)
AST: 16 U/L (ref 15–41)
Albumin: 4 g/dL (ref 3.5–5.0)
Alkaline Phosphatase: 79 U/L (ref 38–126)
Anion gap: 7 (ref 5–15)
BUN: 38 mg/dL — ABNORMAL HIGH (ref 8–23)
CO2: 25 mmol/L (ref 22–32)
Calcium: 9.6 mg/dL (ref 8.9–10.3)
Chloride: 107 mmol/L (ref 98–111)
Creatinine: 1.56 mg/dL — ABNORMAL HIGH (ref 0.61–1.24)
GFR, Estimated: 46 mL/min — ABNORMAL LOW (ref >60)
Glucose, Bld: 129 mg/dL — ABNORMAL HIGH (ref 70–99)
Potassium: 3.9 mmol/L (ref 3.5–5.1)
Sodium: 139 mmol/L (ref 135–145)
Total Bilirubin: 0.3 mg/dL (ref 0.3–1.2)
Total Protein: 7.4 g/dL (ref 6.5–8.1)

## 2022-07-20 LAB — IRON AND IRON BINDING CAPACITY (CC-WL,HP ONLY)
Iron: 28 ug/dL — ABNORMAL LOW (ref 45–182)
Saturation Ratios: 8 % — ABNORMAL LOW (ref 17.9–39.5)
TIBC: 335 ug/dL (ref 250–450)
UIBC: 307 ug/dL (ref 117–376)

## 2022-07-20 LAB — FERRITIN: Ferritin: 74 ng/mL (ref 24–336)

## 2022-07-20 LAB — LACTATE DEHYDROGENASE: LDH: 112 U/L (ref 98–192)

## 2022-07-21 DIAGNOSIS — M1711 Unilateral primary osteoarthritis, right knee: Secondary | ICD-10-CM | POA: Diagnosis not present

## 2022-07-21 LAB — KAPPA/LAMBDA LIGHT CHAINS
Kappa free light chain: 74.5 mg/L — ABNORMAL HIGH (ref 3.3–19.4)
Kappa, lambda light chain ratio: 2.48 — ABNORMAL HIGH (ref 0.26–1.65)
Lambda free light chains: 30 mg/L — ABNORMAL HIGH (ref 5.7–26.3)

## 2022-07-21 LAB — BETA 2 MICROGLOBULIN, SERUM: Beta-2 Microglobulin: 3.1 mg/L — ABNORMAL HIGH (ref 0.6–2.4)

## 2022-07-21 LAB — IGG, IGA, IGM
IgA: 288 mg/dL (ref 61–437)
IgG (Immunoglobin G), Serum: 994 mg/dL (ref 603–1613)
IgM (Immunoglobulin M), Srm: 178 mg/dL — ABNORMAL HIGH (ref 15–143)

## 2022-07-27 ENCOUNTER — Other Ambulatory Visit: Payer: Self-pay

## 2022-07-27 ENCOUNTER — Encounter: Payer: Self-pay | Admitting: Internal Medicine

## 2022-07-27 ENCOUNTER — Inpatient Hospital Stay (HOSPITAL_BASED_OUTPATIENT_CLINIC_OR_DEPARTMENT_OTHER): Payer: Medicare Other | Admitting: Internal Medicine

## 2022-07-27 VITALS — BP 132/67 | HR 78 | Resp 19 | Ht 69.0 in | Wt 281.8 lb

## 2022-07-27 DIAGNOSIS — Z7982 Long term (current) use of aspirin: Secondary | ICD-10-CM | POA: Diagnosis not present

## 2022-07-27 DIAGNOSIS — D472 Monoclonal gammopathy: Secondary | ICD-10-CM | POA: Diagnosis not present

## 2022-07-27 DIAGNOSIS — Z79899 Other long term (current) drug therapy: Secondary | ICD-10-CM | POA: Diagnosis not present

## 2022-07-27 DIAGNOSIS — C61 Malignant neoplasm of prostate: Secondary | ICD-10-CM | POA: Diagnosis not present

## 2022-07-27 DIAGNOSIS — D509 Iron deficiency anemia, unspecified: Secondary | ICD-10-CM

## 2022-07-27 DIAGNOSIS — Z7984 Long term (current) use of oral hypoglycemic drugs: Secondary | ICD-10-CM | POA: Diagnosis not present

## 2022-07-27 NOTE — Progress Notes (Signed)
[facility] [phone]   [phone]  OFFICE PROGRESS NOTE  [name], MD [address] [place] [address]  DIAGNOSIS:  1) monoclonal gammopathy of undetermined significance.  2) iron deficiency anemia.  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 Tresa Moore.   PRIOR THERAPY: Feraheme infusion x 2 doses, last dose was given in October of 2014.  CURRENT THERAPY: Folivane-Plus 1 capsule p.o. daily.  INTERVAL HISTORY: Leonard [name]. 76 y.o. male returns to the clinic today for 6 months follow-up visit.  The patient is feeling fine today with no concerning complaints except for mild fatigue.  He continues to tolerate his treatment with the iron tablet fairly well.  He denied having any chest pain, shortness of breath except with exertion with no cough or hemoptysis.  He has no nausea, vomiting, diarrhea or constipation.  He has no headache or visual changes.  He is here today for evaluation with repeat blood work for evaluation of his anemia and MGUS.   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 09/05/2006   EGD   CKD (chronic kidney disease)    Coronary artery disease    Diverticulosis of colon (without mention of hemorrhage) 09/05/2006   Colonoscopy    Gastric polyp 2014   done at [facility]   GERD (gastroesophageal reflux disease) 07/26/2001   EGD(Chronic)   Gouty arthropathy    Hemorrhoids    Hiatal hernia 09/05/2006   EGD   History of kidney stones    Hx of colonic polyps 09/05/2006   Colonoscopy(ADENOMATOUS POLYP)   Iron deficiency anemia, unspecified    Monoclonal gammopathy    OSA (obstructive sleep apnea)    USES CPAP MACHINE    Other and unspecified hyperlipidemia    Prostate cancer ([place]) 2015    Type II or unspecified type diabetes mellitus without mention of complication, not stated as uncontrolled    Unspecified essential hypertension     ALLERGIES:  is allergic to dicloxacillin, cayenne pepper [cayenne], and nsaids.  MEDICATIONS:  Current Outpatient Medications  Medication Sig Dispense Refill   allopurinol (ZYLOPRIM) 300 MG tablet TAKE 1 TABLET BY MOUTH DAILY TO PREVENT GOUT 90 tablet 3   amLODipine-benazepril (LOTREL) 10-20 MG capsule TAKE 1 CAPSULE BY MOUTH EVERY DAY 90 capsule 3   aspirin 81 MG tablet Take 81 mg by mouth daily.     atorvastatin (LIPITOR) 10 MG tablet TAKE 1 TABLET BY MOUTH EVERY DAY 90 tablet 2   esomeprazole (NEXIUM) 20 MG capsule Take 20 mg by mouth daily at 12 noon.     FeFum-FePoly-FA-B Cmp-C-Biot (FOLIVANE-PLUS) CAPS TAKE 1 CAPSULE BY MOUTH EVERY DAY IN THE MORNING 90 capsule 1   JANUVIA 100 MG tablet TAKE 1 TABLET BY MOUTH EVERY DAY 90 tablet 1   metoprolol succinate (TOPROL-XL) 25 MG 24 hr tablet TAKE 1/2 TABLET BY MOUTH EVERY DAY 45 tablet 0   nitroGLYCERIN (NITROSTAT) 0.4 MG SL tablet Place 1 tablet (0.4 mg total) under the tongue every 5 (five) minutes as needed for chest pain. Reported on 03/04/2016 30 tablet 5   triamterene-hydrochlorothiazide (MAXZIDE-25) 37.5-25 MG tablet TAKE 1 TABLET BY MOUTH EVERY DAY 90 tablet 3   No current facility-administered medications for this visit.    SURGICAL HISTORY:  Past Surgical History:  Procedure Laterality Date   COLONOSCOPY     ESOPHAGOGASTRODUODENOSCOPY     Done at [facility]   ESOPHAGOGASTRODUODENOSCOPY (EGD) WITH PROPOFOL N/A 11/04/2017   Procedure: ESOPHAGOGASTRODUODENOSCOPY (EGD) WITH PROPOFOL;  Surgeon: Jerene Bears, MD;  [facility];  Service: Gastroenterology;  Laterality: N/A;   GI PROSTATE BIOPSY     HEMORRHOID SURGERY     INCISIONAL HERNIA REPAIR N/A 08/07/2015   Procedure: LAPAROSCOPIC INCISIONAL HERNIA repair with mesh;  Surgeon: Arta Bruce Kinsinger, MD;  Location: [facility];  Service:  General;  Laterality: N/A;   KNEE SURGERY  1999   TUMOR REMOVED LEFT KNEE & ARTHROSCOPIC SURGERY   LYMPHADENECTOMY Bilateral 05/01/2014   Procedure: LYMPHADENECTOMY;  Surgeon: Alexis Frock, MD;  Location: [facility];  Service: Urology;  Laterality: Bilateral;   MASS EXCISION Right 08/28/2020   Procedure: EXCISION RIGHT SHOULDER MASS;  Surgeon: Stark Klein, MD;  Location: [facility];  Service: General;  Laterality: Right;   ROBOT ASSISTED LAPAROSCOPIC RADICAL PROSTATECTOMY N/A 05/01/2014   Procedure: ROBOTIC ASSISTED LAPAROSCOPIC RADICAL PROSTATECTOMY WITH INDOCYANINE GREEN DYE;  Surgeon: Alexis Frock, MD;  [facility];  Service: Urology;  Laterality: N/A;   UPPER GASTROINTESTINAL ENDOSCOPY      REVIEW OF SYSTEMS:  A comprehensive review of systems was negative except for: Constitutional: positive for fatigue   PHYSICAL EXAMINATION: General appearance: alert, cooperative, fatigued, and no distress Head: Normocephalic, without obvious abnormality, atraumatic Neck: no adenopathy Lymph nodes: Cervical, supraclavicular, and axillary nodes normal. Resp: clear to auscultation bilaterally Back: symmetric, no curvature. ROM normal. No CVA tenderness. Cardio: irregularly irregular rhythm GI: soft, non-tender; bowel sounds normal; no masses,  no organomegaly Extremities: extremities normal, atraumatic, no cyanosis or edema  ECOG PERFORMANCE STATUS: 1 - Symptomatic but completely ambulatory  Blood pressure 132/67, pulse 78, resp. rate 19, height '5\' 9"'$  (1.753 m), weight 281 lb 12.8 oz (127.8 kg), SpO2 95 %.  LABORATORY DATA: Lab Results  Component Value Date   WBC 10.9 (H) 07/20/2022   HGB 12.1 (L) 07/20/2022   HCT 36.1 (L) 07/20/2022   MCV 92.8 07/20/2022   PLT 286 07/20/2022      Chemistry      Component Value Date/Time   NA 139 07/20/2022 1017   NA 137 06/24/2021 1227   NA 140 09/14/2017 0928   K 3.9 07/20/2022 1017   K 4.0 09/14/2017 0928   CL 107 07/20/2022 1017   CL 104  04/17/2013 0857   CO2 25 07/20/2022 1017   CO2 27 09/14/2017 0928   BUN 38 (H) 07/20/2022 1017   BUN 30 (H) 06/24/2021 1227   BUN 28.3 (H) 09/14/2017 0928   CREATININE 1.56 (H) 07/20/2022 1017   CREATININE 1.37 (H) 07/21/2021 0910   CREATININE 1.4 (H) 09/14/2017 0928      Component Value Date/Time   CALCIUM 9.6 07/20/2022 1017   CALCIUM 10.1 09/14/2017 0928   ALKPHOS 79 07/20/2022 1017   ALKPHOS 80 09/14/2017 0928   AST 16 07/20/2022 1017   AST 17 09/14/2017 0928   ALT 17 07/20/2022 1017   ALT 18 09/14/2017 0928   BILITOT 0.3 07/20/2022 1017   BILITOT 0.45 09/14/2017 0928     RADIOGRAPHIC STUDIES: No results found.  ASSESSMENT AND PLAN:  This is a very pleasant 76 years old African-American male with iron deficiency anemia secondary to history of gastrointestinal blood loss.  The patient also has a history of MGUS and currently on observation. The patient is doing fine today with no concerning complaints except  for mild fatigue. His blood work today showed persistent mild anemia with iron deficiency. The myeloma panel also showed mild increase in the free lambda light chain. I recommended for the patient to continue on the oral iron tablet for now. I will see him back for follow-up visit in 6 months for evaluation with repeat iron study and myeloma panel. He was advised to call immediately if he has any other concerning symptoms in the interval. The patient voices understanding of current disease status and treatment options and is in agreement with the current care plan. All questions were answered. The patient knows to call the clinic with any problems, questions or concerns. We can certainly see the patient much sooner if necessary.   Disclaimer: This note was dictated with voice recognition software. Similar sounding words can inadvertently be transcribed and may not be corrected upon review.

## 2022-07-28 ENCOUNTER — Telehealth: Payer: Self-pay | Admitting: Internal Medicine

## 2022-07-28 ENCOUNTER — Encounter: Payer: Self-pay | Admitting: Internal Medicine

## 2022-07-28 NOTE — Telephone Encounter (Signed)
Phone call to patient. He is having issues with osteoarthritis of knees.He is overweight. He would benefit from weight loss clinic. Wife is going to [facility] weight loss and likes it. We will put in a referral to that office.

## 2022-08-02 ENCOUNTER — Other Ambulatory Visit: Payer: Self-pay | Admitting: Internal Medicine

## 2022-08-11 ENCOUNTER — Other Ambulatory Visit: Payer: Self-pay | Admitting: Internal Medicine

## 2022-08-19 ENCOUNTER — Other Ambulatory Visit: Payer: Medicare Other

## 2022-08-19 DIAGNOSIS — M1000 Idiopathic gout, unspecified site: Secondary | ICD-10-CM

## 2022-08-19 DIAGNOSIS — E1169 Type 2 diabetes mellitus with other specified complication: Secondary | ICD-10-CM | POA: Diagnosis not present

## 2022-08-19 DIAGNOSIS — E785 Hyperlipidemia, unspecified: Secondary | ICD-10-CM | POA: Diagnosis not present

## 2022-08-19 DIAGNOSIS — E119 Type 2 diabetes mellitus without complications: Secondary | ICD-10-CM | POA: Diagnosis not present

## 2022-08-19 DIAGNOSIS — I10 Essential (primary) hypertension: Secondary | ICD-10-CM

## 2022-08-19 DIAGNOSIS — Z8546 Personal history of malignant neoplasm of prostate: Secondary | ICD-10-CM | POA: Diagnosis not present

## 2022-08-20 LAB — CBC WITH DIFFERENTIAL/PLATELET
Absolute Monocytes: 787 cells/uL (ref 200–950)
Basophils Absolute: 24 cells/uL (ref 0–200)
Basophils Relative: 0.2 %
Eosinophils Absolute: 121 cells/uL (ref 15–500)
Eosinophils Relative: 1 %
HCT: 35.2 % — ABNORMAL LOW (ref 38.5–50.0)
Hemoglobin: 11.9 g/dL — ABNORMAL LOW (ref 13.2–17.1)
Lymphs Abs: 3691 cells/uL (ref 850–3900)
MCH: 31.5 pg (ref 27.0–33.0)
MCHC: 33.8 g/dL (ref 32.0–36.0)
MCV: 93.1 fL (ref 80.0–100.0)
MPV: 9.6 fL (ref 7.5–12.5)
Monocytes Relative: 6.5 %
Neutro Abs: 7478 cells/uL (ref 1500–7800)
Neutrophils Relative %: 61.8 %
Platelets: 267 10*3/uL (ref 140–400)
RBC: 3.78 10*6/uL — ABNORMAL LOW (ref 4.20–5.80)
RDW: 14.5 % (ref 11.0–15.0)
Total Lymphocyte: 30.5 %
WBC: 12.1 10*3/uL — ABNORMAL HIGH (ref 3.8–10.8)

## 2022-08-20 LAB — MICROALBUMIN, URINE: Microalb, Ur: 0.5 mg/dL

## 2022-08-20 LAB — LIPID PANEL
Cholesterol: 152 mg/dL (ref <200)
HDL: 73 mg/dL (ref >=40)
LDL Cholesterol (Calc): 62 mg/dL (calc)
Non-HDL Cholesterol (Calc): 79 mg/dL (calc) (ref <130)
Total CHOL/HDL Ratio: 2.1 (calc) (ref <5.0)
Triglycerides: 85 mg/dL (ref <150)

## 2022-08-20 LAB — COMPLETE METABOLIC PANEL WITH GFR
AG Ratio: 1.3 (calc) (ref 1.0–2.5)
ALT: 17 U/L (ref 9–46)
AST: 15 U/L (ref 10–35)
Albumin: 3.8 g/dL (ref 3.6–5.1)
Alkaline phosphatase (APISO): 73 U/L (ref 35–144)
BUN/Creatinine Ratio: 21 (calc) (ref 6–22)
BUN: 32 mg/dL — ABNORMAL HIGH (ref 7–25)
CO2: 23 mmol/L (ref 20–32)
Calcium: 9.7 mg/dL (ref 8.6–10.3)
Chloride: 102 mmol/L (ref 98–110)
Creat: 1.56 mg/dL — ABNORMAL HIGH (ref 0.70–1.28)
Globulin: 3 g/dL (calc) (ref 1.9–3.7)
Glucose, Bld: 115 mg/dL — ABNORMAL HIGH (ref 65–99)
Potassium: 4.4 mmol/L (ref 3.5–5.3)
Sodium: 136 mmol/L (ref 135–146)
Total Bilirubin: 0.4 mg/dL (ref 0.2–1.2)
Total Protein: 6.8 g/dL (ref 6.1–8.1)
eGFR: 46 mL/min/{1.73_m2} — ABNORMAL LOW (ref >=60)

## 2022-08-20 LAB — HEMOGLOBIN A1C
Hgb A1c MFr Bld: 6.4 % of total Hgb — ABNORMAL HIGH (ref <5.7)
Mean Plasma Glucose: 137 mg/dL
eAG (mmol/L): 7.6 mmol/L

## 2022-08-20 LAB — PSA: PSA: <0.04 ng/mL (ref <=4.00)

## 2022-08-23 ENCOUNTER — Ambulatory Visit (INDEPENDENT_AMBULATORY_CARE_PROVIDER_SITE_OTHER): Payer: Medicare Other

## 2022-08-23 ENCOUNTER — Ambulatory Visit: Payer: Medicare Other | Admitting: Internal Medicine

## 2022-08-23 VITALS — Ht 69.25 in | Wt 282.0 lb

## 2022-08-23 DIAGNOSIS — Z0000 Encounter for general adult medical examination without abnormal findings: Secondary | ICD-10-CM | POA: Diagnosis not present

## 2022-08-26 ENCOUNTER — Ambulatory Visit (INDEPENDENT_AMBULATORY_CARE_PROVIDER_SITE_OTHER): Payer: Medicare Other | Admitting: Internal Medicine

## 2022-08-26 ENCOUNTER — Encounter: Payer: Self-pay | Admitting: Internal Medicine

## 2022-08-26 VITALS — BP 116/78 | HR 89 | Resp 16 | Ht 69.0 in | Wt 282.8 lb

## 2022-08-26 DIAGNOSIS — Z0000 Encounter for general adult medical examination without abnormal findings: Secondary | ICD-10-CM | POA: Diagnosis not present

## 2022-08-26 DIAGNOSIS — D472 Monoclonal gammopathy: Secondary | ICD-10-CM

## 2022-08-26 DIAGNOSIS — E785 Hyperlipidemia, unspecified: Secondary | ICD-10-CM

## 2022-08-26 DIAGNOSIS — M170 Bilateral primary osteoarthritis of knee: Secondary | ICD-10-CM

## 2022-08-26 DIAGNOSIS — N183 Chronic kidney disease, stage 3 unspecified: Secondary | ICD-10-CM

## 2022-08-26 DIAGNOSIS — E1169 Type 2 diabetes mellitus with other specified complication: Secondary | ICD-10-CM | POA: Diagnosis not present

## 2022-08-26 DIAGNOSIS — I10 Essential (primary) hypertension: Secondary | ICD-10-CM | POA: Diagnosis not present

## 2022-08-26 DIAGNOSIS — I2510 Atherosclerotic heart disease of native coronary artery without angina pectoris: Secondary | ICD-10-CM

## 2022-08-26 LAB — POCT URINALYSIS DIPSTICK
Bilirubin, UA: NEGATIVE
Blood, UA: NEGATIVE
Glucose, UA: NEGATIVE
Ketones, UA: NEGATIVE
Leukocytes, UA: NEGATIVE
Nitrite, UA: NEGATIVE
Protein, UA: POSITIVE — AB
Spec Grav, UA: 1.02 (ref 1.010–1.025)
Urobilinogen, UA: 0.2 E.U./dL
pH, UA: 6 (ref 5.0–8.0)

## 2022-08-26 NOTE — Patient Instructions (Signed)
Patient will continue with current medications and follow-up in 6 months.  Vaccines discussed.

## 2022-08-26 NOTE — Progress Notes (Signed)
   Subjective:    Patient ID: Leonard [name]., male    DOB: 1946-10-28, 76 y.o.   MRN: [id number]  HPI Here for 6 month follow up. Had Covid vaccine yesterday.  Had flu vaccine  in September   Went to PT at [facility] but did not see a lot of benefit. Gel injections helped knees.  Hx of monoclonal gammopathy and is followed by Dr. Earlie Server.  He is anemic chronically.  Longstanding history of obesity.  History of essential hypertension, type 2 diabetes mellitus, obesity, metabolic syndrome, obstructive sleep apnea and hyperlipidemia.  History of PACs.  History of gout.  History of peripheral neuropathy.  History of GE reflux treated with PPI.  Followed by cardiology for coronary artery disease and had minimal coronary disease on catheterization in 2007.  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  He retired from the [employer].  He is married.  Does not smoke or consume alcohol.  Family history: Father died of an MI.  Mother died of an MI.  2 brothers with history of diabetes.  Patient has 1 sister.  Mother had diabetes in both parents and hypertension.  Remote history of prostate cancer status post robotic prostatectomy June 2015 by Dr. Tresa Moore.  In October 2020, he had an episode of acute pyelonephritis treated as an outpatient.  Review of Systems not really motivated to exercise.     Objective:   Physical Exam Vital signs reviewed.  He is in no acute distress.  Neck is supple.  No carotid bruits.  Chest clear.  Cardiac exam: Regular rate and rhythm.  Affect thought and judgment are normal.       Assessment & Plan:  Mild  monoclonal gammopathy followed by Dr. Earlie Server.  Anemia related to that process  Morbid obesity-BMI 41.76 previously 6 months ago BMI was 42.21  Essential hypertension-stable on amlodipine benazepril 10-20 daily and metoprolol and diuretic (Maxide 25)  Type 2 diabetes mellitus-stable on Januvia  History of prostate  cancer status post prostatectomy  Hyperlipidemia treated with atorvastatin history of GE reflux treated with Nexium  History of gout treated with allopurinol    History of gout with no recent episodes

## 2022-08-31 DIAGNOSIS — E8889 Other specified metabolic disorders: Secondary | ICD-10-CM | POA: Diagnosis not present

## 2022-08-31 DIAGNOSIS — G4733 Obstructive sleep apnea (adult) (pediatric): Secondary | ICD-10-CM | POA: Diagnosis not present

## 2022-08-31 DIAGNOSIS — Z6841 Body Mass Index (BMI) 40.0 and over, adult: Secondary | ICD-10-CM | POA: Diagnosis not present

## 2022-08-31 DIAGNOSIS — E1169 Type 2 diabetes mellitus with other specified complication: Secondary | ICD-10-CM | POA: Diagnosis not present

## 2022-08-31 DIAGNOSIS — Z1389 Encounter for screening for other disorder: Secondary | ICD-10-CM | POA: Diagnosis not present

## 2022-08-31 DIAGNOSIS — M170 Bilateral primary osteoarthritis of knee: Secondary | ICD-10-CM | POA: Diagnosis not present

## 2022-08-31 DIAGNOSIS — K219 Gastro-esophageal reflux disease without esophagitis: Secondary | ICD-10-CM | POA: Diagnosis not present

## 2022-08-31 DIAGNOSIS — I10 Essential (primary) hypertension: Secondary | ICD-10-CM | POA: Diagnosis not present

## 2022-09-04 ENCOUNTER — Other Ambulatory Visit: Payer: Self-pay | Admitting: Internal Medicine

## 2022-09-14 DIAGNOSIS — Z6841 Body Mass Index (BMI) 40.0 and over, adult: Secondary | ICD-10-CM | POA: Diagnosis not present

## 2022-09-14 DIAGNOSIS — E1169 Type 2 diabetes mellitus with other specified complication: Secondary | ICD-10-CM | POA: Diagnosis not present

## 2022-09-14 DIAGNOSIS — I10 Essential (primary) hypertension: Secondary | ICD-10-CM | POA: Diagnosis not present

## 2022-09-28 DIAGNOSIS — I10 Essential (primary) hypertension: Secondary | ICD-10-CM | POA: Diagnosis not present

## 2022-09-28 DIAGNOSIS — Z6841 Body Mass Index (BMI) 40.0 and over, adult: Secondary | ICD-10-CM | POA: Diagnosis not present

## 2022-09-28 DIAGNOSIS — E1169 Type 2 diabetes mellitus with other specified complication: Secondary | ICD-10-CM | POA: Diagnosis not present

## 2022-10-11 ENCOUNTER — Other Ambulatory Visit: Payer: Self-pay | Admitting: Internal Medicine

## 2022-10-13 DIAGNOSIS — M170 Bilateral primary osteoarthritis of knee: Secondary | ICD-10-CM | POA: Diagnosis not present

## 2022-10-19 DIAGNOSIS — I10 Essential (primary) hypertension: Secondary | ICD-10-CM | POA: Diagnosis not present

## 2022-10-19 DIAGNOSIS — E1169 Type 2 diabetes mellitus with other specified complication: Secondary | ICD-10-CM | POA: Diagnosis not present

## 2022-10-19 DIAGNOSIS — Z6841 Body Mass Index (BMI) 40.0 and over, adult: Secondary | ICD-10-CM | POA: Diagnosis not present

## 2022-10-21 ENCOUNTER — Other Ambulatory Visit: Payer: Self-pay | Admitting: Internal Medicine

## 2022-10-24 NOTE — Progress Notes (Unsigned)
Cardiology Office Note   Date:  10/27/2022   ID:  Leonard Pitter., DOB 08/19/1946, MRN [id number]  PCP:  [name], MD  Cardiologist:   Dorris Carnes, MD    F/U of CAD     History of Present Illness: Leonard Meulemans. is a 76 y.o. male with a history of HTN, OSA, minimal CAD at cath in  2007, sleep apnea, MGUS, prostate CA  and HL   I saw the pt in 2020   He was seen by APPs since  Pt denies CP   Breathing is good  No dizzines  No palpitations    Using CPAP    Working now with [employer] for wt loss   On Ozempic      Outpatient Medications Prior to Visit  Medication Sig Dispense Refill   allopurinol (ZYLOPRIM) 300 MG tablet TAKE 1 TABLET BY MOUTH DAILY TO PREVENT GOUT 90 tablet 3   amLODipine-benazepril (LOTREL) 10-20 MG capsule TAKE 1 CAPSULE BY MOUTH EVERY DAY 90 capsule 3   aspirin 81 MG tablet Take 81 mg by mouth daily.     atorvastatin (LIPITOR) 10 MG tablet TAKE 1 TABLET BY MOUTH EVERY DAY 90 tablet 3   esomeprazole (NEXIUM) 20 MG capsule Take 20 mg by mouth daily at 12 noon.     FeFum-FePoly-FA-B Cmp-C-Biot (FOLIVANE-PLUS) CAPS TAKE 1 CAPSULE BY MOUTH EVERY DAY IN THE MORNING 90 capsule 1   JANUVIA 100 MG tablet TAKE 1 TABLET BY MOUTH EVERY DAY 90 tablet 1   metoprolol succinate (TOPROL-XL) 25 MG 24 hr tablet TAKE 1/2 TABLET BY MOUTH EVERY DAY 45 tablet 0   nitroGLYCERIN (NITROSTAT) 0.4 MG SL tablet Place 1 tablet (0.4 mg total) under the tongue every 5 (five) minutes as needed for chest pain. Reported on 03/04/2016 30 tablet 5   OZEMPIC, 0.25 OR 0.5 MG/DOSE, 2 MG/3ML SOPN Inject 0.25 mg into the skin once a week.     triamterene-hydrochlorothiazide (MAXZIDE-25) 37.5-25 MG tablet TAKE 1 TABLET BY MOUTH EVERY DAY 30 tablet 0   No facility-administered medications prior to visit.     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 09/05/2006   EGD    CKD (chronic kidney disease)    Coronary artery disease    Diverticulosis of colon (without mention of hemorrhage) 09/05/2006   Colonoscopy    Gastric polyp 2014   done at [facility]   GERD (gastroesophageal reflux disease) 07/26/2001   EGD(Chronic)   Gouty arthropathy    Hemorrhoids    Hiatal hernia 09/05/2006   EGD   History of kidney stones    Hx of colonic polyps 09/05/2006   Colonoscopy(ADENOMATOUS POLYP)   Iron deficiency anemia, unspecified    Monoclonal gammopathy    OSA (obstructive sleep apnea)    USES CPAP MACHINE    Other and unspecified hyperlipidemia    Prostate cancer ([place]) 2015   Type II or unspecified type diabetes mellitus without mention of complication, not stated as uncontrolled    Unspecified essential hypertension     Past Surgical History:  Procedure Laterality Date   COLONOSCOPY     ESOPHAGOGASTRODUODENOSCOPY     Done at [facility]   ESOPHAGOGASTRODUODENOSCOPY (EGD) WITH PROPOFOL N/A 11/04/2017   Procedure: ESOPHAGOGASTRODUODENOSCOPY (EGD) WITH PROPOFOL;  Surgeon: Jerene Bears, MD;  Location: [facility];  Service: Gastroenterology;  Laterality: N/A;   GI  PROSTATE BIOPSY     HEMORRHOID SURGERY     INCISIONAL HERNIA REPAIR N/A 08/07/2015   Procedure: LAPAROSCOPIC INCISIONAL HERNIA repair with mesh;  Surgeon: Arta Bruce Kinsinger, MD;  [facility];  Service: General;  Laterality: N/A;   KNEE SURGERY  1999   TUMOR REMOVED LEFT KNEE & ARTHROSCOPIC SURGERY   LYMPHADENECTOMY Bilateral 05/01/2014   Procedure: LYMPHADENECTOMY;  Surgeon: Alexis Frock, MD;  [facility];  Service: Urology;  Laterality: Bilateral;   MASS EXCISION Right 08/28/2020   Procedure: EXCISION RIGHT SHOULDER MASS;  Surgeon: Stark Klein, MD;  Location: [facility];  Service: General;  Laterality: Right;   ROBOT ASSISTED LAPAROSCOPIC RADICAL PROSTATECTOMY N/A 05/01/2014   Procedure: ROBOTIC ASSISTED LAPAROSCOPIC RADICAL PROSTATECTOMY WITH INDOCYANINE GREEN DYE;  Surgeon: Alexis Frock, MD;   [facility];  Service: Urology;  Laterality: N/A;   UPPER GASTROINTESTINAL ENDOSCOPY       Social History:  The patient  reports that he has never smoked. He has never used smokeless tobacco. He reports that he does not drink alcohol and does not use drugs.   Family History:  The patient's family history includes Diabetes in his brother and mother; Heart attack in his father.    ROS:  Please see the history of present illness. All other systems are reviewed and  Negative to the above problem except as noted.    PHYSICAL EXAM: VS:  BP 122/74   Pulse 81   Ht '5\' 9"'$  (1.753 m)   Wt 279 lb (126.6 kg)   SpO2 95%   BMI 41.20 kg/m   GEN Morbidly obese 76 yo , in no acute distress HEENT: normal Neck: JVP is normal  No carotid bruits, Cardiac: RRR; no murmurs, ,no edema  Respiratory:  clear to auscultation bilaterally, normal work of breathing GI: soft, nontender, nondistended, + BS  No hepatomegaly  MS: no deformity Moving all extremities   Skin: warm and dry, no rash Neuro:  Strength and sensation are intact Psych: euthymic mood, full affect   EKG:  EKG is ordered today.   NSR 81 bpm   LVH   IWMI    Lipid Panel    Component Value Date/Time   CHOL 152 08/19/2022 1120   TRIG 85 08/19/2022 1120   HDL 73 08/19/2022 1120   CHOLHDL 2.1 08/19/2022 1120   VLDL 18 07/01/2017 1036   LDLCALC 62 08/19/2022 1120      Wt Readings from Last 3 Encounters:  10/27/22 279 lb (126.6 kg)  08/26/22 282 lb 12.8 oz (128.3 kg)  08/23/22 282 lb (127.9 kg)      ASSESSMENT AND PLAN:  1  CAD Minimal on cath in 2007  No symptoms   Confirm LVEF with echo      2  Abnormal EKG   LVH by voltages  Q waves III AVF  Get echo     3 HTN   BP is controlled on current regien    3  LIpdis  Lipids are great  Continue current regeimn  LDL 62  HDL 73    5  DM   A1C 6.4  Working to lose wt  Low carb    4  Exercise  Planning to join [employer]    F/u Fall 2021    Current medicines are reviewed at  length with the patient today.  The patient does not have concerns regarding medicines.  Signed, Dorris Carnes, MD  10/27/2022 10:19 AM    [facility]  [address], [place], [place]  [address] Phone: [phone]; Fax: [phone]

## 2022-10-27 ENCOUNTER — Ambulatory Visit: Payer: Medicare Other | Attending: Internal Medicine | Admitting: Internal Medicine

## 2022-10-27 ENCOUNTER — Encounter: Payer: Self-pay | Admitting: Internal Medicine

## 2022-10-27 VITALS — BP 122/74 | HR 81 | Ht 69.0 in | Wt 279.0 lb

## 2022-10-27 DIAGNOSIS — I2510 Atherosclerotic heart disease of native coronary artery without angina pectoris: Secondary | ICD-10-CM | POA: Insufficient documentation

## 2022-10-27 DIAGNOSIS — I471 Supraventricular tachycardia, unspecified: Secondary | ICD-10-CM | POA: Diagnosis not present

## 2022-10-27 NOTE — Addendum Note (Signed)
Addended by: Ronie Spies on: 10/27/2022 04:44 PM   Modules accepted: Orders

## 2022-10-27 NOTE — Patient Instructions (Signed)
Medication Instructions:   *If you need a refill on your cardiac medications before your next appointment, please call your pharmacy*   Lab Work:  If you have labs (blood work) drawn today and your tests are completely normal, you will receive your results only by: [place] (if you have MyChart) OR A paper copy in the mail If you have any lab test that is abnormal or we need to change your treatment, we will call you to review the results.   Testing/Procedures: Your physician has requested that you have an echocardiogram. Echocardiography is a painless test that uses sound waves to create images of your heart. It provides your doctor with information about the size and shape of your heart and how well your heart's chambers and valves are working. This procedure takes approximately one hour. There are no restrictions for this procedure. Please do NOT wear cologne, perfume, aftershave, or lotions (deodorant is allowed). Please arrive 15 minutes prior to your appointment time.    Follow-Up: At [facility], you and your health needs are our priority.  As part of our continuing mission to provide you with exceptional heart care, we have created designated Provider Care Teams.  These Care Teams include your primary Cardiologist (physician) and Advanced Practice Providers (APPs -  Physician Assistants and Nurse Practitioners) who all work together to provide you with the care you need, when you need it.  We recommend signing up for the patient portal called "MyChart".  Sign up information is provided on this After Visit Summary.  MyChart is used to connect with patients for Virtual Visits (Telemedicine).  Patients are able to view lab/test results, encounter notes, upcoming appointments, etc.  Non-urgent messages can be sent to your provider as well.   To learn more about what you can do with MyChart, go to [email or web address].    Your next appointment:   1 year(s)  The  format for your next appointment:   In Person  Provider:   Dorris Carnes, MD     Other Instructions   Important Information About Sugar

## 2022-11-05 MED ORDER — METOPROLOL SUCCINATE ER 25 MG PO TB24: 12.5000 mg | ORAL_TABLET | Freq: Every day | ORAL | 0 refills | Status: DC

## 2022-11-05 NOTE — Addendum Note (Signed)
Addended by: Fay Records on: 11/05/2022 04:33 PM   Modules accepted: Orders

## 2022-11-08 ENCOUNTER — Other Ambulatory Visit: Payer: Self-pay | Admitting: Internal Medicine

## 2022-11-18 DIAGNOSIS — E1169 Type 2 diabetes mellitus with other specified complication: Secondary | ICD-10-CM | POA: Diagnosis not present

## 2022-11-18 DIAGNOSIS — I10 Essential (primary) hypertension: Secondary | ICD-10-CM | POA: Diagnosis not present

## 2022-11-18 DIAGNOSIS — Z6841 Body Mass Index (BMI) 40.0 and over, adult: Secondary | ICD-10-CM | POA: Diagnosis not present

## 2022-11-30 ENCOUNTER — Ambulatory Visit (HOSPITAL_COMMUNITY): Payer: Medicare Other | Attending: Internal Medicine

## 2022-11-30 DIAGNOSIS — I2510 Atherosclerotic heart disease of native coronary artery without angina pectoris: Secondary | ICD-10-CM | POA: Diagnosis not present

## 2022-11-30 LAB — ECHOCARDIOGRAM COMPLETE
Area-P 1/2: 4.74 cm2
S' Lateral: 2.9 cm

## 2022-12-16 DIAGNOSIS — E1169 Type 2 diabetes mellitus with other specified complication: Secondary | ICD-10-CM | POA: Diagnosis not present

## 2022-12-16 DIAGNOSIS — Z6841 Body Mass Index (BMI) 40.0 and over, adult: Secondary | ICD-10-CM | POA: Diagnosis not present

## 2022-12-16 DIAGNOSIS — I10 Essential (primary) hypertension: Secondary | ICD-10-CM | POA: Diagnosis not present

## 2023-01-05 DIAGNOSIS — M1712 Unilateral primary osteoarthritis, left knee: Secondary | ICD-10-CM | POA: Diagnosis not present

## 2023-01-05 NOTE — Progress Notes (Addendum)
     Annual Wellness Visit     Patient: Leonard Wolbers., Male    DOB: Aug 31, 1946, 77 y.o.   MRN: [id number] Visit Date: 08/23/2022  Chief Complaint  Patient presents with   Leonard Crane R Sawin Jr. is a 77 y.o. male who presents today for his Annual Wellness Visit.  HPI   Social History   Social History Narrative   Not on file    Patient Care Team: [name], Cresenciano Lick, MD as PCP - General (Internal Medicine) Fay Records, MD as PCP - Cardiology (Cardiology) Lowella Bandy, MD (Inactive) (Urology)  Review of Systems   Objective    Vitals: Ht 5' 9.25" (1.759 m)   Wt 282 lb (127.9 kg)   BMI 41.34 kg/m   Physical Exam   Most recent functional status assessment:    08/23/2022    2:38 PM  In your present state of health, do you have any difficulty performing the following activities:  Hearing? 0  Vision? 0  Difficulty concentrating or making decisions? 0  Walking or climbing stairs? 1  Dressing or bathing? 0  Doing errands, shopping? 0  Preparing Food and eating ? N  Using the Toilet? N  In the past six months, have you accidently leaked urine? N  Do you have problems with loss of bowel control? N  Managing your Medications? N  Managing your Finances? N  Housekeeping or managing your Housekeeping? N   Most recent fall risk assessment:    08/26/2022   11:30 AM  [place] in the past year? 0  [place] in past yr: 0  Injury with Fall? 0  Follow up Education provided    Most recent depression screenings:    08/26/2022   11:31 AM 08/23/2022    2:38 PM  PHQ 2/9 Scores  PHQ - 2 Score 0 0   Most recent cognitive screening:    08/23/2022    2:39 PM  6CIT Screen  What Year? 0 points  What time? 0 points  Count back from 20 0 points  Months in reverse 0 points  Repeat phrase 0 points       Assessment & Plan     Annual wellness visit done today including the all of the following: Reviewed patient's Family  Medical History Reviewed and updated list of patient's medical providers Assessment of cognitive impairment was done Assessed patient's functional ability Established a written schedule for health screening Leonard [name] Completed and Reviewed  Discussed health benefits of physical activity, and encouraged him to engage in regular exercise appropriate for his age and condition.            Leonard [name], CMA

## 2023-01-07 ENCOUNTER — Telehealth: Payer: Self-pay | Admitting: Internal Medicine

## 2023-01-07 ENCOUNTER — Telehealth: Payer: Self-pay

## 2023-01-07 ENCOUNTER — Telehealth: Payer: Self-pay | Admitting: *Deleted

## 2023-01-07 NOTE — Telephone Encounter (Signed)
   Name: Leonard [name].  DOB: [birth year]  MRN: [id number]  Primary Cardiologist: Dorris Carnes, MD   Preoperative team, please contact this patient and set up a phone call appointment for further preoperative risk assessment. Please obtain consent and complete medication review. Thank you for your help.  I confirm that guidance regarding antiplatelet and oral anticoagulation therapy has been completed and, if necessary, noted below.  Per office protocol, if patient is without any new symptoms or concerns at the time of their virtual visit, he may hold Aspirin for 5-7 days prior to procedure. Please resume Aspirin as soon as possible postprocedure, at the discretion of the surgeon.    Lenna Sciara, NP 01/07/2023, 9:52 AM [place]

## 2023-01-07 NOTE — Telephone Encounter (Signed)
Pt has been scheduled for tele pre op appt 01/13/23 @ 2:40. Med rec and consent are done.

## 2023-01-07 NOTE — Progress Notes (Addendum)
Patient Care Team: [name], MD as PCP - General (Internal Medicine) Fay Records, MD as PCP - Cardiology (Cardiology) Lowella Bandy, MD (Inactive) (Urology)  Visit Date: 01/14/23  Subjective:    Patient ID: Leonard [name] , Male   DOB: [birth year], 77 y.o.    MRN: [id number]   76 y.o. Male presents today for pre-surgery examination. Patient has a past medical history of Type 2 diabetes mellitus, essential hypertension, prostate cancer 2015, hyperlipidemia, obstructive sleep apnea, monoclonal gammopathy, iron deficiency anemia, colonic polyps, kidney stones, hiatal hernia, hemorrhoids, gouty arthropathy, GERD, gastric polyp, diverticulosis of colon, coronary artery disease, chronic kidney disease, atrophic gastritis, obesity, peripheral neuropathy, osteoarthritis, PACs.  History of osteoarthritis in both knees. Pending left uni knee arthroplasty by Dr. Marchia Bond at [facility]. Has been cleared by Cardiology PA, Nicholes Rough on 01/13/23, who states that the patient may proceed to surgery at acceptable risk.   Denies chest pain, shortness of breath.  Taking Zyloprim, Lotrel, Toprol-XL, Maxzide-25, Lipitor, Nexium, Januvia, Ozempic.  He has a history of benign monoclonal gammopathy and is followed by Dr. Earlie Server.  History of tubular adenomas being removed June 2022 by Dr. Hilarie Fredrickson with 3-year follow-up recommended.  History of internal hemorrhoids that bleed intermittently.  Had benign mass removed from right shoulder area by Dr. Barry Dienes in October 2021.  Remote history of prostate cancer status post robotic prostatectomy June 2015 by Dr. Tresa Moore.  In October 2020 he had an episode of acute pyelonephritis treated as an outpatient.  Followed by Cardiology for coronary artery disease and had minimal coronary disease on catheterization in 2007.  Longstanding history of GE reflux treated with PPI.  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 He  retired from the [employer].  He is married.  Does not smoke or consume alcohol.  Family history: Father died of an MI.  Mother died of an MI.  2 brothers with history of diabetes.  1 sister.  Mother had diabetes and both parents had hypertension.    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 09/05/2006   EGD   CKD (chronic kidney disease)    Coronary artery disease    Diverticulosis of colon (without mention of hemorrhage) 09/05/2006   Colonoscopy    Gastric polyp 2014   done at [facility]   GERD (gastroesophageal reflux disease) 07/26/2001   EGD(Chronic)   Gouty arthropathy    Hemorrhoids    Hiatal hernia 09/05/2006   EGD   History of kidney stones    Hx of colonic polyps 09/05/2006   Colonoscopy(ADENOMATOUS POLYP)   Iron deficiency anemia, unspecified    Monoclonal gammopathy    OSA (obstructive sleep apnea)    USES CPAP MACHINE    Other and unspecified hyperlipidemia    Prostate cancer ([place]) 2015   Type II or unspecified type diabetes mellitus without mention of complication, not stated as uncontrolled    Unspecified essential hypertension      Family History  Problem Relation Age of Onset   Heart attack Father    Diabetes Mother    Diabetes Brother    Colon cancer Neg Hx    Stomach cancer Neg Hx    Esophageal cancer Neg Hx    Rectal cancer Neg Hx    Liver cancer Neg Hx     Social History   Social History Narrative   Not on file  Review of Systems  Constitutional:  Negative for fever and malaise/fatigue.  HENT:  Negative for congestion.   Eyes:  Negative for blurred vision.  Respiratory:  Negative for cough and shortness of breath.   Cardiovascular:  Negative for chest pain, palpitations and leg swelling.  Gastrointestinal:  Negative for vomiting.  Musculoskeletal:  Negative for back pain.  Skin:  Negative for rash.  Neurological:  Negative for loss of consciousness and headaches.        Objective:    Vitals: BP 112/62   Pulse 82   Temp 97.8 F (36.6 C) (Tympanic)   Resp 16   Ht '5\' 9"'$  (1.753 m)   Wt 278 lb 4 oz (126.2 kg)   SpO2 96%   BMI 41.09 kg/m    Physical Exam Vitals and nursing note reviewed.  Constitutional:      General: He is not in acute distress.    Appearance: Normal appearance. He is not ill-appearing.  HENT:     Head: Normocephalic and atraumatic.     Right Ear: Hearing, tympanic membrane, ear canal and external ear normal.     Left Ear: Hearing, tympanic membrane and external ear normal. There is impacted cerumen.  Neck:     Thyroid: No thyroid mass, thyromegaly or thyroid tenderness.     Vascular: No carotid bruit.  Cardiovascular:     Rate and Rhythm: Normal rate and regular rhythm.     Pulses: Normal pulses.     Heart sounds: Normal heart sounds. No murmur heard.    No friction rub. No gallop.  Pulmonary:     Effort: Pulmonary effort is normal. No respiratory distress.     Breath sounds: Normal breath sounds. No wheezing or rales.  Lymphadenopathy:     Cervical: No cervical adenopathy.  Skin:    General: Skin is warm and dry.  Neurological:     Mental Status: He is alert and oriented to person, place, and time. Mental status is at baseline.  Psychiatric:        Mood and Affect: Mood normal.        Behavior: Behavior normal.        Thought Content: Thought content normal.        Judgment: Judgment normal.       Results:   Studies obtained and personally reviewed by me:   Labs:       Component Value Date/Time   NA 136 08/19/2022 1120   NA 137 06/24/2021 1227   NA 140 09/14/2017 0928   K 4.4 08/19/2022 1120   K 4.0 09/14/2017 0928   CL 102 08/19/2022 1120   CL 104 04/17/2013 0857   CO2 23 08/19/2022 1120   CO2 27 09/14/2017 0928   GLUCOSE 115 (H) 08/19/2022 1120   GLUCOSE 113 09/14/2017 0928   GLUCOSE 105 (H) 04/17/2013 0857   BUN 32 (H) 08/19/2022 1120   BUN 30 (H) 06/24/2021 1227   BUN 28.3 (H) 09/14/2017 0928    CREATININE 1.56 (H) 08/19/2022 1120   CREATININE 1.4 (H) 09/14/2017 0928   CALCIUM 9.7 08/19/2022 1120   CALCIUM 10.1 09/14/2017 0928   PROT 6.8 08/19/2022 1120   PROT 8.0 09/14/2017 0928   ALBUMIN 4.0 07/20/2022 1017   ALBUMIN 3.6 09/14/2017 0928   AST 15 08/19/2022 1120   AST 16 07/20/2022 1017   AST 17 09/14/2017 0928   ALT 17 08/19/2022 1120   ALT 17 07/20/2022 1017   ALT 18 09/14/2017 0928   ALKPHOS  79 07/20/2022 1017   ALKPHOS 80 09/14/2017 0928   BILITOT 0.4 08/19/2022 1120   BILITOT 0.3 07/20/2022 1017   BILITOT 0.45 09/14/2017 0928   GFRNONAA 46 (L) 07/20/2022 1017   GFRNONAA 50 (L) 07/08/2020 0907   GFRAA 58 (L) 07/16/2020 1043   GFRAA 58 (L) 07/08/2020 0907     Lab Results  Component Value Date   WBC 12.1 (H) 08/19/2022   HGB 11.9 (L) 08/19/2022   HCT 35.2 (L) 08/19/2022   MCV 93.1 08/19/2022   PLT 267 08/19/2022    Lab Results  Component Value Date   CHOL 152 08/19/2022   HDL 73 08/19/2022   LDLCALC 62 08/19/2022   TRIG 85 08/19/2022   CHOLHDL 2.1 08/19/2022    Lab Results  Component Value Date   HGBA1C 6.4 (H) 08/19/2022     Lab Results  Component Value Date   TSH 1.32 02/06/2021     Lab Results  Component Value Date   PSA <0.04 08/19/2022   PSA <0.04 07/21/2021   PSA <0.1 07/08/2020      Assessment & Plan:   Pre-surgery Examination: I believe stable for upcoming TKA. Had pre- op Cardiology telephone visit Feb 29. Ask pt to have iron studies for Hx of iron deficiency.  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 Has hx mild normocytic anemia. Hgb 11.7 g to 12.1 grams. Iron studies not checked recently at [facility]. Will do so here.  Morbid obesity- BMI 41.09  Primary osteoarthritis of both knees.  Has end-stage osteoarthritis left knee and is scheduled for left Uni knee arthroplasty by Dr. Mardelle Matte.  Essential hypertension-BP stable on current regimen  History of coronary artery disease  without angina  Hyperlipidemia associated with type 2 diabetes mellitus. Hgb AIC  6.4% in October 2023.  Hx of iron deficiency- serum iron is low at 33. Have messaged Dr. Earlie Server who feels he is [facility] for surgery and will arrange for IV iron infusion appt.  I,Alexander Ruley,acting as a Education administrator for [name], MD.,have documented all relevant documentation on the behalf of [name], MD,as directed by  [name], MD while in the presence of [name], MD.   I, [name], MD, have reviewed all documentation for this visit. The documentation on 01/17/23 for the exam, diagnosis, procedures, and orders are all accurate and complete.

## 2023-01-07 NOTE — Telephone Encounter (Signed)
..     Pre-operative Risk Assessment    Patient Name: Leonard [name].  DOB: 03-Jan-1946 MRN: [id number]      Request for Surgical Clearance    Procedure:   LEFT UNI KNEE ARTHROPLASTY  Date of Surgery:  Clearance TBD                                 Surgeon:  DR Marchia Bond Surgeon's Group or Practice Name:  [facility] Phone number:  [phone] Fax number:  [phone]   Type of Clearance Requested:   - Medical  - Pharmacy:  Hold Aspirin     Type of Anesthesia:  Spinal   Additional requests/questions:    Gwenlyn Found   01/07/2023, 9:33 AM

## 2023-01-07 NOTE — Telephone Encounter (Signed)
Received Surgery Clearance Form from [facility] office on 01/06/2023  scheduler is Derek Jack [id number] [phone], Dr. Johnny Bridge. Left Uni knee arthroplasty -- anticipated Anesthetic Spinial  Scheduled surgery clearance appointment for 01/14/2023

## 2023-01-07 NOTE — Telephone Encounter (Signed)
Pt has been scheduled for tele pre op appt 01/13/23 @ 2:40. Med rec and consent are done.     Patient Consent for Virtual Visit        Leonard Greve. has provided verbal consent on 01/07/2023 for a virtual visit (video or telephone).   CONSENT FOR VIRTUAL VISIT FOR:  Leonard [name].  By participating in this virtual visit I agree to the following:  I hereby voluntarily request, consent and authorize [facility] and its employed or contracted physicians, physician assistants, nurse practitioners or other licensed health care professionals (the Practitioner), to provide me with telemedicine health care services (the "Services") as deemed necessary by the treating Practitioner. I acknowledge and consent to receive the Services by the Practitioner via telemedicine. I understand that the telemedicine visit will involve communicating with the Practitioner through live audiovisual communication technology and the disclosure of certain medical information by electronic transmission. I acknowledge that I have been given the opportunity to request an in-person assessment or other available alternative prior to the telemedicine visit and am voluntarily participating in the telemedicine visit.  I understand that I have the right to withhold or withdraw my consent to the use of telemedicine in the course of my care at any time, without affecting my right to future care or treatment, and that the Practitioner or I may terminate the telemedicine visit at any time. I understand that I have the right to inspect all information obtained and/or recorded in the course of the telemedicine visit and may receive copies of available information for a reasonable fee.  I understand that some of the potential risks of receiving the Services via telemedicine include:  Delay or interruption in medical evaluation due to technological equipment failure or disruption; Information transmitted may not be sufficient  (e.g. poor resolution of images) to allow for appropriate medical decision making by the Practitioner; and/or  In rare instances, security protocols could fail, causing a breach of personal health information.  Furthermore, I acknowledge that it is my responsibility to provide information about my medical history, conditions and care that is complete and accurate to the best of my ability. I acknowledge that Practitioner's advice, recommendations, and/or decision may be based on factors not within their control, such as incomplete or inaccurate data provided by me or distortions of diagnostic images or specimens that may result from electronic transmissions. I understand that the practice of medicine is not an exact science and that Practitioner makes no warranties or guarantees regarding treatment outcomes. I acknowledge that a copy of this consent can be made available to me via my patient portal ([facility]), or I can request a printed copy by calling the office of [facility].    I understand that my insurance will be billed for this visit.   I have read or had this consent read to me. I understand the contents of this consent, which adequately explains the benefits and risks of the Services being provided via telemedicine.  I have been provided ample opportunity to ask questions regarding this consent and the Services and have had my questions answered to my satisfaction. I give my informed consent for the services to be provided through the use of telemedicine in my medical care

## 2023-01-08 ENCOUNTER — Other Ambulatory Visit: Payer: Self-pay | Admitting: Physician Assistant

## 2023-01-08 ENCOUNTER — Other Ambulatory Visit: Payer: Self-pay | Admitting: Internal Medicine

## 2023-01-08 DIAGNOSIS — D649 Anemia, unspecified: Secondary | ICD-10-CM

## 2023-01-13 ENCOUNTER — Ambulatory Visit: Payer: Medicare Other | Attending: Internal Medicine

## 2023-01-13 DIAGNOSIS — I10 Essential (primary) hypertension: Secondary | ICD-10-CM | POA: Diagnosis not present

## 2023-01-13 DIAGNOSIS — Z01810 Encounter for preprocedural cardiovascular examination: Secondary | ICD-10-CM

## 2023-01-13 DIAGNOSIS — E1169 Type 2 diabetes mellitus with other specified complication: Secondary | ICD-10-CM | POA: Diagnosis not present

## 2023-01-13 DIAGNOSIS — Z6841 Body Mass Index (BMI) 40.0 and over, adult: Secondary | ICD-10-CM | POA: Diagnosis not present

## 2023-01-13 DIAGNOSIS — M170 Bilateral primary osteoarthritis of knee: Secondary | ICD-10-CM | POA: Diagnosis not present

## 2023-01-13 DIAGNOSIS — G4733 Obstructive sleep apnea (adult) (pediatric): Secondary | ICD-10-CM | POA: Diagnosis not present

## 2023-01-13 NOTE — Progress Notes (Signed)
Virtual Visit via Telephone Note   Because of Leonard SODERBERG Jr.'s co-morbid illnesses, he is at least at moderate risk for complications without adequate follow up.  This format is felt to be most appropriate for this patient at this time.  The patient did not have access to video technology/had technical difficulties with video requiring transitioning to audio format only (telephone).  All issues noted in this document were discussed and addressed.  No physical exam could be performed with this format.  Please refer to the patient's chart for his consent to telehealth for [facility].  Evaluation Performed:  Preoperative cardiovascular risk assessment _____________   Date:  01/13/2023   Patient ID:  Leonard [name]., DOB November 16, 1945, MRN [id number] Patient Location:  Home Provider location:   Office  Primary Care Provider:  [name], MD Primary Cardiologist:  Dorris Carnes, MD  Chief Complaint / Patient Profile   77 y.o. y/o male with a h/o HTN. OSA, minimal CAD (cath 2007), sleep apnea, MGUS, prostate CA and HL who is pending left knee arthoplasty and presents today for telephonic preoperative cardiovascular risk assessment.  History of Present Illness    Leonard Scarpati. is a 77 y.o. male who presents via audio/video conferencing for a telehealth visit today.  Pt was last seen in cardiology clinic on 10/27/22 by Dr. Harrington Challenger.  At that time Leonard [name]. was doing well .  The patient is now pending procedure as outlined above. Since his last visit, he has had no issues with his heart. He denies chest pain and SOB. He knows to hold his ASA for 5-7 days. He overall feels good today. He has scored a 5.07 mets on the DASI. This exceed the minimum mets requirement.    Past Medical History    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 09/05/2006   EGD   CKD (chronic kidney disease)    Coronary artery disease     Diverticulosis of colon (without mention of hemorrhage) 09/05/2006   Colonoscopy    Gastric polyp 2014   done at [facility]   GERD (gastroesophageal reflux disease) 07/26/2001   EGD(Chronic)   Gouty arthropathy    Hemorrhoids    Hiatal hernia 09/05/2006   EGD   History of kidney stones    Hx of colonic polyps 09/05/2006   Colonoscopy(ADENOMATOUS POLYP)   Iron deficiency anemia, unspecified    Monoclonal gammopathy    OSA (obstructive sleep apnea)    USES CPAP MACHINE    Other and unspecified hyperlipidemia    Prostate cancer ([place]) 2015   Type II or unspecified type diabetes mellitus without mention of complication, not stated as uncontrolled    Unspecified essential hypertension    Past Surgical History:  Procedure Laterality Date   COLONOSCOPY     ESOPHAGOGASTRODUODENOSCOPY     Done at [facility]   ESOPHAGOGASTRODUODENOSCOPY (EGD) WITH PROPOFOL N/A 11/04/2017   Procedure: ESOPHAGOGASTRODUODENOSCOPY (EGD) WITH PROPOFOL;  Surgeon: Jerene Bears, MD;  Location: [facility];  Service: Gastroenterology;  Laterality: N/A;   GI PROSTATE BIOPSY     HEMORRHOID SURGERY     INCISIONAL HERNIA REPAIR N/A 08/07/2015   Procedure: LAPAROSCOPIC INCISIONAL HERNIA repair with mesh;  Surgeon: Arta Bruce Kinsinger, MD;  Location: [facility];  Service: General;  Laterality: N/A;   KNEE SURGERY  1999   TUMOR REMOVED LEFT KNEE & ARTHROSCOPIC SURGERY   LYMPHADENECTOMY Bilateral 05/01/2014   Procedure:  LYMPHADENECTOMY;  Surgeon: Alexis Frock, MD;  Location: [facility];  Service: Urology;  Laterality: Bilateral;   MASS EXCISION Right 08/28/2020   Procedure: EXCISION RIGHT SHOULDER MASS;  Surgeon: Stark Klein, MD;  Location: [facility];  Service: General;  Laterality: Right;   ROBOT ASSISTED LAPAROSCOPIC RADICAL PROSTATECTOMY N/A 05/01/2014   Procedure: ROBOTIC ASSISTED LAPAROSCOPIC RADICAL PROSTATECTOMY WITH INDOCYANINE GREEN DYE;  Surgeon: Alexis Frock, MD;  Location: [facility];  Service: Urology;  Laterality: N/A;   UPPER  GASTROINTESTINAL ENDOSCOPY      Allergies  Allergies  Allergen Reactions   Dicloxacillin Rash and Other (See Comments)    Has patient had a PCN reaction causing immediate rash, facial/tongue/throat swelling, SOB or lightheadedness with hypotension: No Has patient had a PCN reaction causing severe rash involving mucus membranes or skin necrosis: No Has patient had a PCN reaction that required hospitalization: No Has patient had a PCN reaction occurring within the last 10 years: No If all of the above answers are "NO", then may proceed with Cephalosporin use.    Cayenne Pepper [Cayenne] Swelling   Nsaids Other (See Comments)    PT declines Acute renal failure 08/09/2015    Home Medications    Prior to Admission medications   Medication Sig Start Date End Date Taking? Authorizing Provider  allopurinol (ZYLOPRIM) 300 MG tablet TAKE 1 TABLET BY MOUTH DAILY TO PREVENT GOUT 01/08/23   [name], MD  amLODipine-benazepril (LOTREL) 10-20 MG capsule TAKE 1 CAPSULE BY MOUTH EVERY DAY 09/04/22   [name], MD  aspirin 81 MG tablet Take 81 mg by mouth daily.    [provider]  atorvastatin (LIPITOR) 10 MG tablet TAKE 1 TABLET BY MOUTH EVERY DAY 08/11/22   [name], MD  esomeprazole (NEXIUM) 20 MG capsule Take 20 mg by mouth daily at 12 noon.    [provider]  FeFum-FePoly-FA-B Cmp-C-Biot (FOLIVANE-PLUS) CAPS TAKE 1 CAPSULE BY MOUTH EVERY DAY IN THE MORNING 01/08/23   Heilingoetter, Cassandra L, PA-C  JANUVIA 100 MG tablet TAKE 1 TABLET BY MOUTH EVERY DAY 08/03/22   [name], MD  metoprolol succinate (TOPROL-XL) 25 MG 24 hr tablet Take 0.5 tablets (12.5 mg total) by mouth daily. 11/05/22   Fay Records, MD  nitroGLYCERIN (NITROSTAT) 0.4 MG SL tablet Place 1 tablet (0.4 mg total) under the tongue every 5 (five) minutes as needed for chest pain. Reported on 03/04/2016 Patient not taking: Reported on 01/07/2023 05/05/20   [name], MD  OZEMPIC, 0.25 OR 0.5  MG/DOSE, 2 MG/3ML SOPN Inject 0.25 mg into the skin once a week. 09/27/22   [provider]  triamterene-hydrochlorothiazide ([id number]) 37.5-25 MG tablet TAKE 1 TABLET BY MOUTH EVERY DAY 11/10/22   Fay Records, MD    Physical Exam    Vital Signs:  Leonard [name]. does not have vital signs available for review today.  Given telephonic nature of communication, physical exam is limited. AAOx3. NAD. Normal affect.  Speech and respirations are unlabored.  Accessory Clinical Findings    None  Assessment & Plan    1.  Preoperative Cardiovascular Risk Assessment:  Mr. Allor perioperative risk of a major cardiac event is 0.9% according to the Revised Cardiac Risk Index (RCRI).  Therefore, he is at low risk for perioperative complications.   His functional capacity is good at 5.07 METs according to the [facility] Activity Status Index (DASI). Recommendations: According to ACC/AHA guidelines, no further cardiovascular testing needed.  The  patient may proceed to surgery at acceptable risk.   Antiplatelet and/or Anticoagulation Recommendations: Aspirin can be held for 5-7 days prior to his surgery.  Please resume Aspirin post operatively when it is felt to be safe from a bleeding standpoint.     The patient was advised that if he develops new symptoms prior to surgery to contact our office to arrange for a follow-up visit, and he verbalized understanding.   A copy of this note will be routed to requesting surgeon.  Time:   Today, I have spent 10 minutes with the patient with telehealth technology discussing medical history, symptoms, and management plan.     Leonard Collard, PA-C  01/13/2023, 3:07 PM

## 2023-01-14 ENCOUNTER — Encounter: Payer: Self-pay | Admitting: Internal Medicine

## 2023-01-14 ENCOUNTER — Ambulatory Visit (INDEPENDENT_AMBULATORY_CARE_PROVIDER_SITE_OTHER): Payer: Medicare Other | Admitting: Internal Medicine

## 2023-01-14 VITALS — BP 112/62 | HR 82 | Temp 97.8°F | Resp 16 | Ht 69.0 in | Wt 278.2 lb

## 2023-01-14 DIAGNOSIS — E611 Iron deficiency: Secondary | ICD-10-CM

## 2023-01-14 DIAGNOSIS — Z6841 Body Mass Index (BMI) 40.0 and over, adult: Secondary | ICD-10-CM

## 2023-01-14 DIAGNOSIS — I10 Essential (primary) hypertension: Secondary | ICD-10-CM

## 2023-01-14 DIAGNOSIS — M170 Bilateral primary osteoarthritis of knee: Secondary | ICD-10-CM | POA: Diagnosis not present

## 2023-01-14 DIAGNOSIS — E119 Type 2 diabetes mellitus without complications: Secondary | ICD-10-CM | POA: Diagnosis not present

## 2023-01-14 DIAGNOSIS — Z01818 Encounter for other preprocedural examination: Secondary | ICD-10-CM

## 2023-01-14 DIAGNOSIS — Z8739 Personal history of other diseases of the musculoskeletal system and connective tissue: Secondary | ICD-10-CM | POA: Diagnosis not present

## 2023-01-14 DIAGNOSIS — I2510 Atherosclerotic heart disease of native coronary artery without angina pectoris: Secondary | ICD-10-CM

## 2023-01-14 DIAGNOSIS — Z9079 Acquired absence of other genital organ(s): Secondary | ICD-10-CM

## 2023-01-14 DIAGNOSIS — D472 Monoclonal gammopathy: Secondary | ICD-10-CM

## 2023-01-14 DIAGNOSIS — E1169 Type 2 diabetes mellitus with other specified complication: Secondary | ICD-10-CM | POA: Diagnosis not present

## 2023-01-14 DIAGNOSIS — Z8639 Personal history of other endocrine, nutritional and metabolic disease: Secondary | ICD-10-CM

## 2023-01-14 DIAGNOSIS — Z8546 Personal history of malignant neoplasm of prostate: Secondary | ICD-10-CM | POA: Diagnosis not present

## 2023-01-14 DIAGNOSIS — Z86010 Personal history of colonic polyps: Secondary | ICD-10-CM

## 2023-01-14 DIAGNOSIS — N1832 Chronic kidney disease, stage 3b: Secondary | ICD-10-CM | POA: Diagnosis not present

## 2023-01-14 DIAGNOSIS — E785 Hyperlipidemia, unspecified: Secondary | ICD-10-CM

## 2023-01-14 NOTE — Patient Instructions (Addendum)
Patient will have pre-op labs per [facility]. Has recently seen Cardiology and had EKG and 2 D echo at that time. He is iron deficient. I have messaged Dr. Earlie Server who will arrange for IV iron infusions in the near future but he heels he is [facility] for surgery.

## 2023-01-15 LAB — MICROALBUMIN / CREATININE URINE RATIO
Creatinine, Urine: 137 mg/dL (ref 20–320)
Microalb Creat Ratio: 5 mcg/mg creat (ref <30)
Microalb, Ur: 0.7 mg/dL

## 2023-01-17 ENCOUNTER — Ambulatory Visit (INDEPENDENT_AMBULATORY_CARE_PROVIDER_SITE_OTHER): Payer: Medicare Other | Admitting: Pulmonary Disease

## 2023-01-17 ENCOUNTER — Encounter (HOSPITAL_BASED_OUTPATIENT_CLINIC_OR_DEPARTMENT_OTHER): Payer: Self-pay | Admitting: Pulmonary Disease

## 2023-01-17 ENCOUNTER — Other Ambulatory Visit: Payer: Medicare Other

## 2023-01-17 VITALS — BP 110/60 | HR 85 | Ht 69.0 in | Wt 277.9 lb

## 2023-01-17 DIAGNOSIS — G4733 Obstructive sleep apnea (adult) (pediatric): Secondary | ICD-10-CM

## 2023-01-17 DIAGNOSIS — I2510 Atherosclerotic heart disease of native coronary artery without angina pectoris: Secondary | ICD-10-CM | POA: Diagnosis not present

## 2023-01-17 NOTE — Progress Notes (Signed)
[facility]  Chief Complaint  Patient presents with   Follow-up    Cpap compliance    Past Surgical History:  He  has a past surgical history that includes Knee surgery (1999); Hemorrhoid surgery; Robot assisted laparoscopic radical prostatectomy (N/A, 05/01/2014); Lymphadenectomy (Bilateral, 05/01/2014); Esophagogastroduodenoscopy; GI prostate biopsy; Incisional hernia repair (N/A, 08/07/2015); Esophagogastroduodenoscopy (egd) with propofol (N/A, 11/04/2017); Mass excision (Right, 08/28/2020); Colonoscopy; and Upper gastrointestinal endoscopy.  Past Medical History:  CAD, HTN, HLD, Diverticulosis, GERD, HH, Prostate cancer, DM, Gout  Constitutional:  BP 110/60 (BP Location: Right Arm, Cuff Size: Large)   Pulse 85   Ht '5\' 9"'$  (1.753 m)   Wt 277 lb 14.2 oz (126.1 kg)   SpO2 93%   BMI 41.04 kg/m   Brief Summary:  Leonard Zaman. is a 77 y.o. male with obstructive sleep apnea.      Subjective:   He uses CPAP nightly.  Gets about 4 to 5 hours sleep per night.  No issues with mask fit or pressure.  Feels rested.  Asked about Inspire device.  Physical Exam:   Appearance - well kempt   ENMT - no sinus tenderness, no oral exudate, no LAN, Mallampati 3 airway, no stridor  Respiratory - equal breath sounds bilaterally, no wheezing or rales  CV - s1s2 regular rate and rhythm, no murmurs  Ext - no clubbing, no edema  Skin - no rashes  Psych - normal mood and affect    Sleep Tests:  PSG 10/19/07 >> AHI 29 CPAP 12/20/21 to 02/17/21 >> used on 54 of 60 nights with average 4 hrs.  Average AHI 6.5 with CPAP 11 cm H2O  Cardiac Tests:  Echo 08/27/11 >> EF 55 to 65%, mild MR, PAS 32 mmHg  Social History:  He  reports that he has never smoked. He has never used smokeless tobacco. He reports that he does not drink alcohol and does not use drugs.  Family History:  His family history includes Diabetes in his brother and mother; Heart  attack in his father.     Assessment/Plan:   Obstructive sleep apnea. - he is compliant with CPAP and reports benefit from therapy - he uses [employer] for his DME - his current CPAP is more than 76 yrs old - will arrange for new Resmed CPAP at 11 cm H2O - reviewed different options for CPAP therapy, including the Inspire device; explained his BMI would need to be less than 35 at a minimum prior to considering this as an option  Obesity. - he is aware of how his weight can impact his health, particularly in relation to his sleep apnea  Time Spent Involved in Patient Care on Day of Examination:  26 minutes  Follow up:   Patient Instructions  Will arrange for new CPAP machine  Follow up in 6 months  Medication List:   Allergies as of 01/17/2023       Reactions   Dicloxacillin Rash, Other (See Comments)   Has patient had a PCN reaction causing immediate rash, facial/tongue/throat swelling, SOB or lightheadedness with hypotension: No Has patient had a PCN reaction causing severe rash involving mucus membranes or skin necrosis: No Has patient had a PCN reaction that required hospitalization: No Has patient had a PCN reaction occurring within the last 10 years: No If all of the above answers are "NO", then may proceed with Cephalosporin use.   Cayenne Pepper [cayenne] Swelling   Nsaids Other (See Comments)  PT declines Acute renal failure 08/09/2015        Medication List        Accurate as of January 17, 2023 11:43 AM. If you have any questions, ask your nurse or doctor.          allopurinol 300 MG tablet Commonly known as: ZYLOPRIM TAKE 1 TABLET BY MOUTH DAILY TO PREVENT GOUT   amLODipine-benazepril 10-20 MG capsule Commonly known as: LOTREL TAKE 1 CAPSULE BY MOUTH EVERY DAY   aspirin 81 MG tablet Take 81 mg by mouth daily.   atorvastatin 10 MG tablet Commonly known as: LIPITOR TAKE 1 TABLET BY MOUTH EVERY DAY   esomeprazole 20 MG capsule Commonly known as:  NEXIUM Take 20 mg by mouth daily at 12 noon.   Folivane-Plus Caps TAKE 1 CAPSULE BY MOUTH EVERY DAY IN THE MORNING   Januvia 100 MG tablet Generic drug: sitaGLIPtin TAKE 1 TABLET BY MOUTH EVERY DAY   metoprolol succinate 25 MG 24 hr tablet Commonly known as: TOPROL-XL Take 0.5 tablets (12.5 mg total) by mouth daily.   nitroGLYCERIN 0.4 MG SL tablet Commonly known as: NITROSTAT Place 1 tablet (0.4 mg total) under the tongue every 5 (five) minutes as needed for chest pain. Reported on 03/04/2016   Ozempic (0.25 or 0.5 MG/DOSE) 2 MG/3ML Sopn Generic drug: Semaglutide(0.25 or 0.'5MG'$ /DOS) Inject 0.25 mg into the skin once a week.   triamterene-hydrochlorothiazide 37.5-25 MG tablet Commonly known as: MAXZIDE-25 TAKE 1 TABLET BY MOUTH EVERY DAY        Signature:  Chesley Mires, MD [place] Pager - [phone] 01/17/2023, 11:43 AM

## 2023-01-17 NOTE — Patient Instructions (Signed)
Will arrange for new CPAP machine  Follow up in 6 months

## 2023-01-18 ENCOUNTER — Other Ambulatory Visit: Payer: Medicare Other

## 2023-01-18 DIAGNOSIS — D509 Iron deficiency anemia, unspecified: Secondary | ICD-10-CM | POA: Diagnosis not present

## 2023-01-18 DIAGNOSIS — Z8639 Personal history of other endocrine, nutritional and metabolic disease: Secondary | ICD-10-CM

## 2023-01-19 LAB — IRON,TIBC AND FERRITIN PANEL
%SAT: 11 % (calc) — ABNORMAL LOW (ref 20–48)
Ferritin: 84 ng/mL (ref 24–380)
Iron: 33 ug/dL — ABNORMAL LOW (ref 50–180)
TIBC: 295 mcg/dL (calc) (ref 250–425)

## 2023-01-19 NOTE — Telephone Encounter (Signed)
Called and spoke with patient and let him know about his lab results and gave him Dr Verlene Mayer instructions of taking '325mg'$ 's of iron twice a day and we would recheck his Fe/Tibc in 6 weeks 03/01/2023.

## 2023-01-19 NOTE — Telephone Encounter (Signed)
Called and spoke with Sherri at [facility] office and let her know Rev. Lindvall's Iron is low and we are working on it to get it up and will send his surgery clearance when he is approved to move forward with surgery.

## 2023-01-19 NOTE — Progress Notes (Signed)
Called and relayed messaged to patient and scheduled follow up visit. Patient verbalized understanding

## 2023-01-25 ENCOUNTER — Other Ambulatory Visit: Payer: Self-pay | Admitting: Internal Medicine

## 2023-01-25 ENCOUNTER — Other Ambulatory Visit: Payer: Self-pay

## 2023-01-25 ENCOUNTER — Inpatient Hospital Stay: Payer: Medicare Other | Attending: Internal Medicine

## 2023-01-25 DIAGNOSIS — Z9079 Acquired absence of other genital organ(s): Secondary | ICD-10-CM | POA: Diagnosis not present

## 2023-01-25 DIAGNOSIS — Z7982 Long term (current) use of aspirin: Secondary | ICD-10-CM | POA: Diagnosis not present

## 2023-01-25 DIAGNOSIS — Z79899 Other long term (current) drug therapy: Secondary | ICD-10-CM | POA: Insufficient documentation

## 2023-01-25 DIAGNOSIS — C61 Malignant neoplasm of prostate: Secondary | ICD-10-CM | POA: Insufficient documentation

## 2023-01-25 DIAGNOSIS — K219 Gastro-esophageal reflux disease without esophagitis: Secondary | ICD-10-CM | POA: Diagnosis not present

## 2023-01-25 DIAGNOSIS — E1122 Type 2 diabetes mellitus with diabetic chronic kidney disease: Secondary | ICD-10-CM | POA: Insufficient documentation

## 2023-01-25 DIAGNOSIS — I129 Hypertensive chronic kidney disease with stage 1 through stage 4 chronic kidney disease, or unspecified chronic kidney disease: Secondary | ICD-10-CM | POA: Diagnosis not present

## 2023-01-25 DIAGNOSIS — E785 Hyperlipidemia, unspecified: Secondary | ICD-10-CM | POA: Insufficient documentation

## 2023-01-25 DIAGNOSIS — D472 Monoclonal gammopathy: Secondary | ICD-10-CM | POA: Diagnosis not present

## 2023-01-25 DIAGNOSIS — Z7984 Long term (current) use of oral hypoglycemic drugs: Secondary | ICD-10-CM | POA: Diagnosis not present

## 2023-01-25 DIAGNOSIS — N189 Chronic kidney disease, unspecified: Secondary | ICD-10-CM | POA: Insufficient documentation

## 2023-01-25 DIAGNOSIS — G4733 Obstructive sleep apnea (adult) (pediatric): Secondary | ICD-10-CM | POA: Insufficient documentation

## 2023-01-25 DIAGNOSIS — D509 Iron deficiency anemia, unspecified: Secondary | ICD-10-CM | POA: Diagnosis not present

## 2023-01-25 DIAGNOSIS — I2510 Atherosclerotic heart disease of native coronary artery without angina pectoris: Secondary | ICD-10-CM | POA: Diagnosis not present

## 2023-01-25 LAB — IRON AND IRON BINDING CAPACITY (CC-WL,HP ONLY)
Iron: 44 ug/dL — ABNORMAL LOW (ref 45–182)
Saturation Ratios: 13 % — ABNORMAL LOW (ref 17.9–39.5)
TIBC: 329 ug/dL (ref 250–450)
UIBC: 285 ug/dL (ref 117–376)

## 2023-01-25 LAB — CBC WITH DIFFERENTIAL (CANCER CENTER ONLY)
Abs Immature Granulocytes: 0.02 10*3/uL (ref 0.00–0.07)
Basophils Absolute: 0 10*3/uL (ref 0.0–0.1)
Basophils Relative: 0 %
Eosinophils Absolute: 0.1 10*3/uL (ref 0.0–0.5)
Eosinophils Relative: 1 %
HCT: 35.1 % — ABNORMAL LOW (ref 39.0–52.0)
Hemoglobin: 11.8 g/dL — ABNORMAL LOW (ref 13.0–17.0)
Immature Granulocytes: 0 %
Lymphocytes Relative: 27 %
Lymphs Abs: 2.8 10*3/uL (ref 0.7–4.0)
MCH: 31.3 pg (ref 26.0–34.0)
MCHC: 33.6 g/dL (ref 30.0–36.0)
MCV: 93.1 fL (ref 80.0–100.0)
Monocytes Absolute: 0.8 10*3/uL (ref 0.1–1.0)
Monocytes Relative: 7 %
Neutro Abs: 6.8 10*3/uL (ref 1.7–7.7)
Neutrophils Relative %: 65 %
Platelet Count: 276 10*3/uL (ref 150–400)
RBC: 3.77 MIL/uL — ABNORMAL LOW (ref 4.22–5.81)
RDW: 14.9 % (ref 11.5–15.5)
WBC Count: 10.5 10*3/uL (ref 4.0–10.5)
nRBC: 0 % (ref 0.0–0.2)

## 2023-01-25 LAB — CMP (CANCER CENTER ONLY)
ALT: 17 U/L (ref 0–44)
AST: 14 U/L — ABNORMAL LOW (ref 15–41)
Albumin: 3.8 g/dL (ref 3.5–5.0)
Alkaline Phosphatase: 71 U/L (ref 38–126)
Anion gap: 8 (ref 5–15)
BUN: 24 mg/dL — ABNORMAL HIGH (ref 8–23)
CO2: 27 mmol/L (ref 22–32)
Calcium: 9.3 mg/dL (ref 8.9–10.3)
Chloride: 103 mmol/L (ref 98–111)
Creatinine: 1.18 mg/dL (ref 0.61–1.24)
GFR, Estimated: >60 mL/min (ref >60)
Glucose, Bld: 106 mg/dL — ABNORMAL HIGH (ref 70–99)
Potassium: 3.8 mmol/L (ref 3.5–5.1)
Sodium: 138 mmol/L (ref 135–145)
Total Bilirubin: 0.5 mg/dL (ref 0.3–1.2)
Total Protein: 7.2 g/dL (ref 6.5–8.1)

## 2023-01-25 LAB — LACTATE DEHYDROGENASE: LDH: 125 U/L (ref 98–192)

## 2023-01-25 LAB — FERRITIN: Ferritin: 73 ng/mL (ref 24–336)

## 2023-01-26 LAB — BETA 2 MICROGLOBULIN, SERUM: Beta-2 Microglobulin: 2.5 mg/L — ABNORMAL HIGH (ref 0.6–2.4)

## 2023-01-26 NOTE — Telephone Encounter (Signed)
Just faxed Surgery Clearance form,office note, labs and medication list to Sherri at [facility]   To:               Recipient at [id number] Subject:          Fw: Hp Scans Result:           The transmission was successful. Explanation:      All Pages Ok Pages Sent:       10 Connect Time:     4 minutes, 43 seconds Transmit Time:    01/26/2023 12:10 Transfer Rate:    14400 Status Code:      0000 Retry Count:      1 Job Id:           [id number] Unique [id number] Fax Line:         15 Fax Server:       [employer]

## 2023-01-26 NOTE — Telephone Encounter (Signed)
Called and left a voice message for Sherri that Leonard [name] had been cleared to continue with surgery as planned and we would be faxing over his surgery clearance form.

## 2023-01-27 LAB — IGG, IGA, IGM
IgA: 313 mg/dL (ref 61–437)
IgG (Immunoglobin G), Serum: 1014 mg/dL (ref 603–1613)
IgM (Immunoglobulin M), Srm: 184 mg/dL — ABNORMAL HIGH (ref 15–143)

## 2023-01-27 LAB — KAPPA/LAMBDA LIGHT CHAINS
Kappa free light chain: 62.5 mg/L — ABNORMAL HIGH (ref 3.3–19.4)
Kappa, lambda light chain ratio: 1.93 — ABNORMAL HIGH (ref 0.26–1.65)
Lambda free light chains: 32.4 mg/L — ABNORMAL HIGH (ref 5.7–26.3)

## 2023-02-01 ENCOUNTER — Other Ambulatory Visit: Payer: Self-pay

## 2023-02-01 ENCOUNTER — Inpatient Hospital Stay (HOSPITAL_BASED_OUTPATIENT_CLINIC_OR_DEPARTMENT_OTHER): Payer: Medicare Other | Admitting: Internal Medicine

## 2023-02-01 VITALS — BP 116/60 | HR 98 | Temp 98.2°F | Resp 17 | Wt 277.1 lb

## 2023-02-01 DIAGNOSIS — D509 Iron deficiency anemia, unspecified: Secondary | ICD-10-CM | POA: Diagnosis not present

## 2023-02-01 DIAGNOSIS — E1122 Type 2 diabetes mellitus with diabetic chronic kidney disease: Secondary | ICD-10-CM | POA: Diagnosis not present

## 2023-02-01 DIAGNOSIS — C61 Malignant neoplasm of prostate: Secondary | ICD-10-CM | POA: Diagnosis not present

## 2023-02-01 DIAGNOSIS — I129 Hypertensive chronic kidney disease with stage 1 through stage 4 chronic kidney disease, or unspecified chronic kidney disease: Secondary | ICD-10-CM | POA: Diagnosis not present

## 2023-02-01 DIAGNOSIS — D508 Other iron deficiency anemias: Secondary | ICD-10-CM | POA: Diagnosis not present

## 2023-02-01 DIAGNOSIS — D472 Monoclonal gammopathy: Secondary | ICD-10-CM | POA: Diagnosis not present

## 2023-02-01 DIAGNOSIS — N189 Chronic kidney disease, unspecified: Secondary | ICD-10-CM | POA: Diagnosis not present

## 2023-02-01 NOTE — Progress Notes (Signed)
[facility] [phone]   [phone]  OFFICE PROGRESS NOTE  [name], MD [address] [place] [phone]  DIAGNOSIS:  1) monoclonal gammopathy of undetermined significance.  2) iron deficiency anemia.  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 Tresa Moore.   PRIOR THERAPY: Feraheme infusion x 2 doses, last dose was given in October of 2014.  CURRENT THERAPY: Folivane-Plus 1 capsule p.o. daily.  INTERVAL HISTORY: Leonard [name]. 77 y.o. male returns to the clinic today for follow-up visit.  The patient is feeling fine today with no concerning complaints except for mild fatigue.  He is expected to have left knee surgery soon.  He denied having any current chest pain, shortness of breath, cough or hemoptysis.  He has no nausea, vomiting, diarrhea or constipation.  He has no headache or visual changes.  He denied having any recent weight loss or night sweats.  He is here today for evaluation with repeat CBC, iron study as well as myeloma panel.  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 09/05/2006   EGD   CKD (chronic kidney disease)    Coronary artery disease    Diverticulosis of colon (without mention of hemorrhage) 09/05/2006   Colonoscopy    Gastric polyp 2014   done at [facility]   GERD (gastroesophageal reflux disease) 07/26/2001   EGD(Chronic)   Gouty arthropathy    Hemorrhoids    Hiatal hernia 09/05/2006   EGD   History of kidney stones    Hx of colonic polyps 09/05/2006   Colonoscopy(ADENOMATOUS POLYP)   Iron deficiency anemia, unspecified    Monoclonal gammopathy    OSA (obstructive sleep apnea)    USES CPAP MACHINE    Other and unspecified hyperlipidemia    Prostate cancer ([place]) 2015   Type II or  unspecified type diabetes mellitus without mention of complication, not stated as uncontrolled    Unspecified essential hypertension     ALLERGIES:  is allergic to dicloxacillin, cayenne pepper [cayenne], and nsaids.  MEDICATIONS:  Current Outpatient Medications  Medication Sig Dispense Refill   allopurinol (ZYLOPRIM) 300 MG tablet TAKE 1 TABLET BY MOUTH DAILY TO PREVENT GOUT 90 tablet 3   amLODipine-benazepril (LOTREL) 10-20 MG capsule TAKE 1 CAPSULE BY MOUTH EVERY DAY 90 capsule 3   aspirin 81 MG tablet Take 81 mg by mouth daily.     atorvastatin (LIPITOR) 10 MG tablet TAKE 1 TABLET BY MOUTH EVERY DAY 90 tablet 3   esomeprazole (NEXIUM) 20 MG capsule Take 20 mg by mouth daily at 12 noon.     FeFum-FePoly-FA-B Cmp-C-Biot (FOLIVANE-PLUS) CAPS TAKE 1 CAPSULE BY MOUTH EVERY DAY IN THE MORNING 90 capsule 1   JANUVIA 100 MG tablet TAKE 1 TABLET BY MOUTH EVERY DAY 90 tablet 1   metoprolol succinate (TOPROL-XL) 25 MG 24 hr tablet Take 0.5 tablets (12.5 mg total) by mouth daily. 45 tablet 0   nitroGLYCERIN (NITROSTAT) 0.4 MG SL tablet Place 1 tablet (0.4 mg total) under the tongue every 5 (five) minutes as needed for chest pain. Reported on 03/04/2016 30 tablet 5   OZEMPIC, 0.25 OR 0.5 MG/DOSE, 2 MG/3ML SOPN Inject 0.25 mg into the skin once a week.     triamterene-hydrochlorothiazide (MAXZIDE-25) 37.5-25 MG tablet TAKE 1 TABLET BY MOUTH EVERY  DAY 90 tablet 3   No current facility-administered medications for this visit.    SURGICAL HISTORY:  Past Surgical History:  Procedure Laterality Date   COLONOSCOPY     ESOPHAGOGASTRODUODENOSCOPY     Done at [facility]   ESOPHAGOGASTRODUODENOSCOPY (EGD) WITH PROPOFOL N/A 11/04/2017   Procedure: ESOPHAGOGASTRODUODENOSCOPY (EGD) WITH PROPOFOL;  Surgeon: Jerene Bears, MD;  Location: [facility];  Service: Gastroenterology;  Laterality: N/A;   GI PROSTATE BIOPSY     HEMORRHOID SURGERY     INCISIONAL HERNIA REPAIR N/A 08/07/2015   Procedure: LAPAROSCOPIC  INCISIONAL HERNIA repair with mesh;  Surgeon: Arta Bruce Kinsinger, MD;  Location: [facility];  Service: General;  Laterality: N/A;   KNEE SURGERY  1999   TUMOR REMOVED LEFT KNEE & ARTHROSCOPIC SURGERY   LYMPHADENECTOMY Bilateral 05/01/2014   Procedure: LYMPHADENECTOMY;  Surgeon: Alexis Frock, MD;  Location: [facility];  Service: Urology;  Laterality: Bilateral;   MASS EXCISION Right 08/28/2020   Procedure: EXCISION RIGHT SHOULDER MASS;  Surgeon: Stark Klein, MD;  Location: [facility];  Service: General;  Laterality: Right;   ROBOT ASSISTED LAPAROSCOPIC RADICAL PROSTATECTOMY N/A 05/01/2014   Procedure: ROBOTIC ASSISTED LAPAROSCOPIC RADICAL PROSTATECTOMY WITH INDOCYANINE GREEN DYE;  Surgeon: Alexis Frock, MD;  Location: [facility];  Service: Urology;  Laterality: N/A;   UPPER GASTROINTESTINAL ENDOSCOPY      REVIEW OF SYSTEMS:  A comprehensive review of systems was negative except for: Constitutional: positive for fatigue Musculoskeletal: positive for arthralgias   PHYSICAL EXAMINATION: General appearance: alert, cooperative, fatigued, and no distress Head: Normocephalic, without obvious abnormality, atraumatic Neck: no adenopathy Lymph nodes: Cervical, supraclavicular, and axillary nodes normal. Resp: clear to auscultation bilaterally Back: symmetric, no curvature. ROM normal. No CVA tenderness. Cardio: irregularly irregular rhythm GI: soft, non-tender; bowel sounds normal; no masses,  no organomegaly Extremities: extremities normal, atraumatic, no cyanosis or edema  ECOG PERFORMANCE STATUS: 1 - Symptomatic but completely ambulatory  Blood pressure 116/60, pulse 98, temperature 98.2 F (36.8 C), temperature source Oral, resp. rate 17, weight 277 lb 2 oz (125.7 kg), SpO2 97 %.  LABORATORY DATA: Lab Results  Component Value Date   WBC 10.5 01/25/2023   HGB 11.8 (L) 01/25/2023   HCT 35.1 (L) 01/25/2023   MCV 93.1 01/25/2023   PLT 276 01/25/2023      Chemistry      Component Value Date/Time    NA 138 01/25/2023 1001   NA 137 06/24/2021 1227   NA 140 09/14/2017 0928   K 3.8 01/25/2023 1001   K 4.0 09/14/2017 0928   CL 103 01/25/2023 1001   CL 104 04/17/2013 0857   CO2 27 01/25/2023 1001   CO2 27 09/14/2017 0928   BUN 24 (H) 01/25/2023 1001   BUN 30 (H) 06/24/2021 1227   BUN 28.3 (H) 09/14/2017 0928   CREATININE 1.18 01/25/2023 1001   CREATININE 1.56 (H) 08/19/2022 1120   CREATININE 1.4 (H) 09/14/2017 0928      Component Value Date/Time   CALCIUM 9.3 01/25/2023 1001   CALCIUM 10.1 09/14/2017 0928   ALKPHOS 71 01/25/2023 1001   ALKPHOS 80 09/14/2017 0928   AST 14 (L) 01/25/2023 1001   AST 17 09/14/2017 0928   ALT 17 01/25/2023 1001   ALT 18 09/14/2017 0928   BILITOT 0.5 01/25/2023 1001   BILITOT 0.45 09/14/2017 0928     RADIOGRAPHIC STUDIES: No results found.  ASSESSMENT AND PLAN:  This is a very pleasant 77 years old African-American male with iron deficiency anemia secondary to  history of gastrointestinal blood loss.  The patient also has a history of MGUS and currently on observation. Repeat blood work today showed mild iron deficiency anemia. He had myeloma panel that was unremarkable for any concerning progression. I recommended for him to continue with the oral iron tablets for now. I think the patient will be okay to proceed with the knee replacement from the oncology standpoint. I will see him back for follow-up visit in 6 months for evaluation and repeat blood work. He was advised to call immediately if he has any other concerning symptoms in the interval. The patient voices understanding of current disease status and treatment options and is in agreement with the current care plan. All questions were answered. The patient knows to call the clinic with any problems, questions or concerns. We can certainly see the patient much sooner if necessary.   Disclaimer: This note was dictated with voice recognition software. Similar sounding words can  inadvertently be transcribed and may not be corrected upon review.

## 2023-02-04 ENCOUNTER — Other Ambulatory Visit: Payer: Self-pay | Admitting: Internal Medicine

## 2023-02-06 ENCOUNTER — Other Ambulatory Visit: Payer: Self-pay | Admitting: Internal Medicine

## 2023-02-15 DIAGNOSIS — E1169 Type 2 diabetes mellitus with other specified complication: Secondary | ICD-10-CM | POA: Diagnosis not present

## 2023-02-15 DIAGNOSIS — Z6836 Body mass index (BMI) 36.0-36.9, adult: Secondary | ICD-10-CM | POA: Diagnosis not present

## 2023-02-15 DIAGNOSIS — I10 Essential (primary) hypertension: Secondary | ICD-10-CM | POA: Diagnosis not present

## 2023-02-15 NOTE — Addendum Note (Signed)
Addended by: Geradine Girt D on: 02/15/2023 02:25 PM   Modules accepted: Level of Service

## 2023-02-17 ENCOUNTER — Ambulatory Visit: Payer: Medicare Other | Admitting: Internal Medicine

## 2023-02-22 NOTE — Progress Notes (Shared)
Patient Care Team: Margaree Mackintosh, MD as PCP - General (Internal Medicine) Pricilla Riffle, MD as PCP - Cardiology (Cardiology) Su Grand, MD (Inactive) (Urology)  Visit Date: 03/01/23  Subjective:    Patient ID: Leonard [name] , Male   DOB: [birth year], 77 y.o.    MRN: [id number]   77 y.o. Male presents today for a 6 month follow-up. Patient has a past medical history of atrophic gastritis, chronic kidney disease, coronary artery disease, diverticulosis of colon, gastric polyp, GERD, gouty arthropathy, hemorrhoids, hiatal hernia, kidney stones, colonic polyps, iron deficiency anemia, monoclonal gammopathy, obstructive sleep apnea, hyperlipidemia, prostate cancer 2015, Type 2 diabetes, hypertension.  History of hypertension treated with amlodipine-benazepril 10-20 mg daily, triamterene-hydrochlorothiazide 37.5-25 mg daily, metoprolol succinate 12.5 mg daily. Blood pressure normal today at 110/68.  History of hyperlipidemia treated with atorvastatin 10 mg daily. Lipid panel normal on 02/24/23.  History of obstructive sleep apnea. Uses CPAP. Seen by pulmonologist, Dr. Craige Cotta.  History of Type 2 diabetes mellitus treated with Ozempic 0.5 mg weekly, januvia 100 mg daily. HGBA1c at 6.4% on 02/24/23. He is walking regularly.  History of gout treated with allopurinol 300 mg daily.  Planning for left knee arthroplasty in July.  Total iron low at 46 on 02/24/23. Taking Folivane-Plus daily in the morning.   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 09/05/2006   EGD   CKD (chronic kidney disease)    Coronary artery disease    Diverticulosis of colon (without mention of hemorrhage) 09/05/2006   Colonoscopy    Gastric polyp 2014   done at [facility]   GERD (gastroesophageal reflux disease) 07/26/2001   EGD(Chronic)   Gouty arthropathy    Hemorrhoids    Hiatal hernia 09/05/2006   EGD   History of kidney stones    Hx of colonic polyps 09/05/2006    Colonoscopy(ADENOMATOUS POLYP)   Iron deficiency anemia, unspecified    Monoclonal gammopathy    OSA (obstructive sleep apnea)    USES CPAP MACHINE    Other and unspecified hyperlipidemia    Prostate cancer 2015   Type II or unspecified type diabetes mellitus without mention of complication, not stated as uncontrolled    Unspecified essential hypertension      Family History  Problem Relation Age of Onset   Heart attack Father    Diabetes Mother    Diabetes Brother    Colon cancer Neg Hx    Stomach cancer Neg Hx    Esophageal cancer Neg Hx    Rectal cancer Neg Hx    Liver cancer Neg Hx     Social History   Social History Narrative   Not on file      Review of Systems  Constitutional:  Negative for fever and malaise/fatigue.  HENT:  Negative for congestion.   Eyes:  Negative for blurred vision.  Respiratory:  Negative for cough and shortness of breath.   Cardiovascular:  Negative for chest pain, palpitations and leg swelling.  Gastrointestinal:  Negative for vomiting.  Musculoskeletal:  Negative for back pain.  Skin:  Negative for rash.  Neurological:  Negative for loss of consciousness and headaches.        Objective:   Vitals: BP 110/68   Pulse 95   Temp 97.8 F (36.6 C) (Tympanic)   Ht  (1.753 m)   Wt 277 lb 12.8 oz (126 kg)   SpO2 94%   BMI 41.02 kg/m  Physical Exam Constitutional:      General: He is not in acute distress.    Appearance: Normal appearance. He is not ill-appearing.  HENT:     Head: Normocephalic and atraumatic.  Cardiovascular:     Rate and Rhythm: Normal rate and regular rhythm.     Pulses: Normal pulses.     Heart sounds: Normal heart sounds. No murmur heard.    No friction rub. No gallop.  Pulmonary:     Effort: Pulmonary effort is normal. No respiratory distress.     Breath sounds: Normal breath sounds. No wheezing or rales.  Skin:    General: Skin is warm and dry.  Neurological:     Mental Status: He is  alert and oriented to person, place, and time. Mental status is at baseline.  Psychiatric:        Mood and Affect: Mood normal.        Behavior: Behavior normal.        Thought Content: Thought content normal.        Judgment: Judgment normal.       Results:   Studies obtained and personally reviewed by me:   Labs:       Component Value Date/Time   NA 138 01/25/2023 1001   NA 137 06/24/2021 1227   NA 140 09/14/2017 0928   K 3.8 01/25/2023 1001   K 4.0 09/14/2017 0928   CL 103 01/25/2023 1001   CL 104 04/17/2013 0857   CO2 27 01/25/2023 1001   CO2 27 09/14/2017 0928   GLUCOSE 106 (H) 01/25/2023 1001   GLUCOSE 113 09/14/2017 0928   GLUCOSE 105 (H) 04/17/2013 0857   BUN 24 (H) 01/25/2023 1001   BUN 30 (H) 06/24/2021 1227   BUN 28.3 (H) 09/14/2017 0928   CREATININE 1.18 01/25/2023 1001   CREATININE 1.56 (H) 08/19/2022 1120   CREATININE 1.4 (H) 09/14/2017 0928   CALCIUM 9.3 01/25/2023 1001   CALCIUM 10.1 09/14/2017 0928   PROT 6.6 02/24/2023 0914   PROT 8.0 09/14/2017 0928   ALBUMIN 3.8 01/25/2023 1001   ALBUMIN 3.6 09/14/2017 0928   AST 15 02/24/2023 0914   AST 14 (L) 01/25/2023 1001   AST 17 09/14/2017 0928   ALT 16 02/24/2023 0914   ALT 17 01/25/2023 1001   ALT 18 09/14/2017 0928   ALKPHOS 71 01/25/2023 1001   ALKPHOS 80 09/14/2017 0928   BILITOT 0.5 02/24/2023 0914   BILITOT 0.5 01/25/2023 1001   BILITOT 0.45 09/14/2017 0928   GFRNONAA >60 01/25/2023 1001   GFRNONAA 50 (L) 07/08/2020 0907   GFRAA 58 (L) 07/16/2020 1043   GFRAA 58 (L) 07/08/2020 0907     Lab Results  Component Value Date   WBC 10.5 01/25/2023   HGB 11.8 (L) 01/25/2023   HCT 35.1 (L) 01/25/2023   MCV 93.1 01/25/2023   PLT 276 01/25/2023    Lab Results  Component Value Date   CHOL 134 02/24/2023   HDL 67 02/24/2023   LDLCALC 53 02/24/2023   TRIG 60 02/24/2023   CHOLHDL 2.0 02/24/2023    Lab Results  Component Value Date   HGBA1C 6.4 (H) 02/24/2023     Lab Results   Component Value Date   TSH 1.32 02/06/2021     Lab Results  Component Value Date   PSA <0.04 08/19/2022   PSA <0.04 07/21/2021   PSA <0.1 07/08/2020      Assessment & Plan:   Hypertension: treated with amlodipine-benazepril 10-20 mg  daily, triamterene-hydrochlorothiazide 37.5-25 mg daily, metoprolol succinate 12.5 mg daily. Blood pressure normal today at 110/68.  Hyperlipidemia: treated with atorvastatin 10 mg daily. Lipid panel normal on 02/24/23.  Obstructive sleep apnea: uses CPAP. Seen by pulmonologist, Dr. Craige Cotta.  Type 2 diabetes mellitus: treated with Ozempic 0.5 mg weekly, januvia 100 mg daily. HGBA1c at 6.4% on 02/24/23. He is walking regularly. Start metformin 500 mg daily.  Gout: treated with allopurinol 300 mg daily.  Iron deficiency: total iron low at 46 on 02/24/23. Taking Folivane-Plus daily in the morning.  Planning for left knee arthroplasty in July.  Return in 6 months for health maintenance exam.    I,Alexander Ruley,acting as a scribe for Margaree Mackintosh, MD.,have documented all relevant documentation on the behalf of Margaree Mackintosh, MD,as directed by  Margaree Mackintosh, MD while in the presence of Margaree Mackintosh, MD.   ***

## 2023-02-24 ENCOUNTER — Other Ambulatory Visit: Payer: Medicare Other

## 2023-02-24 DIAGNOSIS — E611 Iron deficiency: Secondary | ICD-10-CM | POA: Diagnosis not present

## 2023-02-24 DIAGNOSIS — E785 Hyperlipidemia, unspecified: Secondary | ICD-10-CM | POA: Diagnosis not present

## 2023-02-24 DIAGNOSIS — E119 Type 2 diabetes mellitus without complications: Secondary | ICD-10-CM

## 2023-02-25 LAB — IRON,TIBC AND FERRITIN PANEL
%SAT: 15 % (calc) — ABNORMAL LOW (ref 20–48)
Ferritin: 86 ng/mL (ref 24–380)
Iron: 46 ug/dL — ABNORMAL LOW (ref 50–180)
TIBC: 315 mcg/dL (calc) (ref 250–425)

## 2023-02-25 LAB — HEPATIC FUNCTION PANEL
AG Ratio: 1.3 (calc) (ref 1.0–2.5)
ALT: 16 U/L (ref 9–46)
AST: 15 U/L (ref 10–35)
Albumin: 3.7 g/dL (ref 3.6–5.1)
Alkaline phosphatase (APISO): 76 U/L (ref 35–144)
Bilirubin, Direct: 0.1 mg/dL (ref 0.0–0.2)
Globulin: 2.9 g/dL (calc) (ref 1.9–3.7)
Indirect Bilirubin: 0.4 mg/dL (calc) (ref 0.2–1.2)
Total Bilirubin: 0.5 mg/dL (ref 0.2–1.2)
Total Protein: 6.6 g/dL (ref 6.1–8.1)

## 2023-02-25 LAB — LIPID PANEL
Cholesterol: 134 mg/dL (ref <200)
HDL: 67 mg/dL (ref >=40)
LDL Cholesterol (Calc): 53 mg/dL (calc)
Non-HDL Cholesterol (Calc): 67 mg/dL (calc) (ref <130)
Total CHOL/HDL Ratio: 2 (calc) (ref <5.0)
Triglycerides: 60 mg/dL (ref <150)

## 2023-02-25 LAB — HEMOGLOBIN A1C
Hgb A1c MFr Bld: 6.4 % of total Hgb — ABNORMAL HIGH (ref <5.7)
Mean Plasma Glucose: 137 mg/dL
eAG (mmol/L): 7.6 mmol/L

## 2023-03-01 ENCOUNTER — Ambulatory Visit (INDEPENDENT_AMBULATORY_CARE_PROVIDER_SITE_OTHER): Payer: Medicare Other | Admitting: Internal Medicine

## 2023-03-01 ENCOUNTER — Encounter: Payer: Self-pay | Admitting: Internal Medicine

## 2023-03-01 VITALS — BP 110/68 | HR 95 | Temp 97.8°F | Ht 69.0 in | Wt 277.8 lb

## 2023-03-01 DIAGNOSIS — D472 Monoclonal gammopathy: Secondary | ICD-10-CM | POA: Diagnosis not present

## 2023-03-01 DIAGNOSIS — I10 Essential (primary) hypertension: Secondary | ICD-10-CM

## 2023-03-01 DIAGNOSIS — E1169 Type 2 diabetes mellitus with other specified complication: Secondary | ICD-10-CM | POA: Diagnosis not present

## 2023-03-01 DIAGNOSIS — Z8739 Personal history of other diseases of the musculoskeletal system and connective tissue: Secondary | ICD-10-CM | POA: Diagnosis not present

## 2023-03-01 DIAGNOSIS — E119 Type 2 diabetes mellitus without complications: Secondary | ICD-10-CM | POA: Diagnosis not present

## 2023-03-01 DIAGNOSIS — I2510 Atherosclerotic heart disease of native coronary artery without angina pectoris: Secondary | ICD-10-CM | POA: Diagnosis not present

## 2023-03-01 DIAGNOSIS — Z9079 Acquired absence of other genital organ(s): Secondary | ICD-10-CM

## 2023-03-01 DIAGNOSIS — M170 Bilateral primary osteoarthritis of knee: Secondary | ICD-10-CM | POA: Diagnosis not present

## 2023-03-01 DIAGNOSIS — Z86010 Personal history of colonic polyps: Secondary | ICD-10-CM

## 2023-03-01 DIAGNOSIS — Z8546 Personal history of malignant neoplasm of prostate: Secondary | ICD-10-CM

## 2023-03-01 DIAGNOSIS — N1832 Chronic kidney disease, stage 3b: Secondary | ICD-10-CM | POA: Diagnosis not present

## 2023-03-01 DIAGNOSIS — Z6841 Body Mass Index (BMI) 40.0 and over, adult: Secondary | ICD-10-CM

## 2023-03-01 DIAGNOSIS — Z860101 Personal history of adenomatous and serrated colon polyps: Secondary | ICD-10-CM

## 2023-03-01 DIAGNOSIS — E785 Hyperlipidemia, unspecified: Secondary | ICD-10-CM

## 2023-03-01 MED ORDER — METFORMIN HCL 500 MG PO TABS: 500.0000 mg | ORAL_TABLET | ORAL | 0 refills | Status: DC

## 2023-03-01 NOTE — Patient Instructions (Signed)
It was a pleasure to see you today. Add metformin 500 mg daily. Rtc in 6 months.

## 2023-03-08 ENCOUNTER — Telehealth: Payer: Self-pay

## 2023-03-08 ENCOUNTER — Ambulatory Visit (INDEPENDENT_AMBULATORY_CARE_PROVIDER_SITE_OTHER): Payer: Medicare Other | Admitting: Internal Medicine

## 2023-03-08 ENCOUNTER — Encounter: Payer: Self-pay | Admitting: Internal Medicine

## 2023-03-08 VITALS — BP 112/62 | HR 86 | Temp 97.9°F | Ht 69.0 in | Wt 266.8 lb

## 2023-03-08 DIAGNOSIS — M5431 Sciatica, right side: Secondary | ICD-10-CM | POA: Diagnosis not present

## 2023-03-08 DIAGNOSIS — M170 Bilateral primary osteoarthritis of knee: Secondary | ICD-10-CM

## 2023-03-08 MED ORDER — OXYCODONE-ACETAMINOPHEN 10-325 MG PO TABS: 1.0000 | ORAL_TABLET | Freq: Three times a day (TID) | ORAL | 0 refills | Status: AC | PRN

## 2023-03-08 MED ORDER — METHYLPREDNISOLONE 4 MG PO TABS
ORAL_TABLET | ORAL | 0 refills | Status: DC
Start: 2023-03-08 — End: 2023-12-14

## 2023-03-08 NOTE — Progress Notes (Signed)
Patient Care Team: Margaree Mackintosh, MD as PCP - General (Internal Medicine) Pricilla Riffle, MD as PCP - Cardiology (Cardiology) Su Grand, MD (Inactive) (Urology)  Visit Date: 03/08/23  Subjective:    Patient ID: Leonard [name] , Male   DOB: 05/29/1946, 77 y.o.    MRN: [id number]   77 y.o. Male presents today for right lower back pain after stretching with a band on 03/03/23. Taking oxycodone without relief. Has not been checking his blood sugar at home regularly.   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 09/05/2006   EGD   CKD (chronic kidney disease)    Coronary artery disease    Diverticulosis of colon (without mention of hemorrhage) 09/05/2006   Colonoscopy    Gastric polyp 2014   done at [facility]   GERD (gastroesophageal reflux disease) 07/26/2001   EGD(Chronic)   Gouty arthropathy    Hemorrhoids    Hiatal hernia 09/05/2006   EGD   History of kidney stones    Hx of colonic polyps 09/05/2006   Colonoscopy(ADENOMATOUS POLYP)   Iron deficiency anemia, unspecified    Monoclonal gammopathy    OSA (obstructive sleep apnea)    USES CPAP MACHINE    Other and unspecified hyperlipidemia    Prostate cancer 2015   Type II or unspecified type diabetes mellitus without mention of complication, not stated as uncontrolled    Unspecified essential hypertension      Family History  Problem Relation Age of Onset   Heart attack Father    Diabetes Mother    Diabetes Brother    Colon cancer Neg Hx    Stomach cancer Neg Hx    Esophageal cancer Neg Hx    Rectal cancer Neg Hx    Liver cancer Neg Hx     Social History   Social History Narrative   Not on file      Review of Systems  Constitutional:  Negative for fever and malaise/fatigue.  HENT:  Negative for congestion.   Eyes:  Negative for blurred vision.  Respiratory:  Negative for cough and shortness of breath.   Cardiovascular:  Negative for chest pain, palpitations  and leg swelling.  Gastrointestinal:  Negative for vomiting.  Musculoskeletal:  Positive for back pain (Lower right).  Skin:  Negative for rash.  Neurological:  Negative for loss of consciousness and headaches.        Objective:   Vitals: BP 112/62   Pulse 86   Temp 97.9 F (36.6 C) (Tympanic)   Ht  (1.753 m)   Wt 266 lb 12.8 oz (121 kg)   SpO2 95%   BMI 39.40 kg/m    Physical Exam Vitals and nursing note reviewed.  Constitutional:      General: He is not in acute distress.    Appearance: Normal appearance. He is not ill-appearing.  HENT:     Head: Normocephalic and atraumatic.  Pulmonary:     Effort: Pulmonary effort is normal.  Musculoskeletal:     Comments: Right straight leg raise test positive for pain in right lower back. 5/5 muscle strength in all groups tested.  Skin:    General: Skin is warm and dry.  Neurological:     Mental Status: He is alert and oriented to person, place, and time. Mental status is at baseline.     Deep Tendon Reflexes:     Reflex Scores:      Patellar reflexes  are 0 on the right side. Psychiatric:        Mood and Affect: Mood normal.        Behavior: Behavior normal.        Thought Content: Thought content normal.        Judgment: Judgment normal.       Results:   Studies obtained and personally reviewed by me:    Labs:       Component Value Date/Time   NA 138 01/25/2023 1001   NA 137 06/24/2021 1227   NA 140 09/14/2017 0928   K 3.8 01/25/2023 1001   K 4.0 09/14/2017 0928   CL 103 01/25/2023 1001   CL 104 04/17/2013 0857   CO2 27 01/25/2023 1001   CO2 27 09/14/2017 0928   GLUCOSE 106 (H) 01/25/2023 1001   GLUCOSE 113 09/14/2017 0928   GLUCOSE 105 (H) 04/17/2013 0857   BUN 24 (H) 01/25/2023 1001   BUN 30 (H) 06/24/2021 1227   BUN 28.3 (H) 09/14/2017 0928   CREATININE 1.18 01/25/2023 1001   CREATININE 1.56 (H) 08/19/2022 1120   CREATININE 1.4 (H) 09/14/2017 0928   CALCIUM 9.3 01/25/2023 1001   CALCIUM  10.1 09/14/2017 0928   PROT 6.6 02/24/2023 0914   PROT 8.0 09/14/2017 0928   ALBUMIN 3.8 01/25/2023 1001   ALBUMIN 3.6 09/14/2017 0928   AST 15 02/24/2023 0914   AST 14 (L) 01/25/2023 1001   AST 17 09/14/2017 0928   ALT 16 02/24/2023 0914   ALT 17 01/25/2023 1001   ALT 18 09/14/2017 0928   ALKPHOS 71 01/25/2023 1001   ALKPHOS 80 09/14/2017 0928   BILITOT 0.5 02/24/2023 0914   BILITOT 0.5 01/25/2023 1001   BILITOT 0.45 09/14/2017 0928   GFRNONAA >60 01/25/2023 1001   GFRNONAA 50 (L) 07/08/2020 0907   GFRAA 58 (L) 07/16/2020 1043   GFRAA 58 (L) 07/08/2020 0907     Lab Results  Component Value Date   WBC 10.5 01/25/2023   HGB 11.8 (L) 01/25/2023   HCT 35.1 (L) 01/25/2023   MCV 93.1 01/25/2023   PLT 276 01/25/2023    Lab Results  Component Value Date   CHOL 134 02/24/2023   HDL 67 02/24/2023   LDLCALC 53 02/24/2023   TRIG 60 02/24/2023   CHOLHDL 2.0 02/24/2023    Lab Results  Component Value Date   HGBA1C 6.4 (H) 02/24/2023     Lab Results  Component Value Date   TSH 1.32 02/06/2021     Lab Results  Component Value Date   PSA <0.04 08/19/2022   PSA <0.04 07/21/2021   PSA <0.1 07/08/2020      Assessment & Plan:   Right lower back pain: prescribed oxycodone 10-325 mg every 8 hours, medrol dosepak tapering course take as directed. Can take cyclobenzaprine for muscle spasms. Contact me if pain is not improved by 03/10/23. Will consider PT if pain persists.    I,Alexander Ruley,acting as a Neurosurgeon for Margaree Mackintosh, MD.,have documented all relevant documentation on the behalf of Margaree Mackintosh, MD,as directed by  Margaree Mackintosh, MD while in the presence of Margaree Mackintosh, MD.   I, Margaree Mackintosh, MD, have reviewed all documentation for this visit. The documentation on 03/09/23 for the exam, diagnosis, procedures, and orders are all accurate and complete.

## 2023-03-08 NOTE — Telephone Encounter (Signed)
Scheduled 12/30.

## 2023-03-08 NOTE — Telephone Encounter (Signed)
patient called stating he sprained his lower back last week and would like to be seen so an Rx can be prescribed.

## 2023-03-08 NOTE — Patient Instructions (Signed)
Taking Medrol 4 mg tablets tapering dose as directed starting with 6 tablets day 1 and decreasing by 1 tablet daily in other words 6 days 5 days for day 3 - 2 - 1 taper.  He has some generic Flexeril at times which she may take sparingly for back spasms.  Oxycodone APAP 10/325 ( # 15 tablets) to take sparingly with food every 8 hours as needed for severe right back pain.  If not improving in a few days, we will order physical therapy for him.

## 2023-03-09 DIAGNOSIS — M170 Bilateral primary osteoarthritis of knee: Secondary | ICD-10-CM | POA: Insufficient documentation

## 2023-03-15 DIAGNOSIS — E1169 Type 2 diabetes mellitus with other specified complication: Secondary | ICD-10-CM | POA: Diagnosis not present

## 2023-03-15 DIAGNOSIS — I10 Essential (primary) hypertension: Secondary | ICD-10-CM | POA: Diagnosis not present

## 2023-03-15 DIAGNOSIS — Z6841 Body Mass Index (BMI) 40.0 and over, adult: Secondary | ICD-10-CM | POA: Diagnosis not present

## 2023-04-12 DIAGNOSIS — Z6839 Body mass index (BMI) 39.0-39.9, adult: Secondary | ICD-10-CM | POA: Diagnosis not present

## 2023-04-12 DIAGNOSIS — I10 Essential (primary) hypertension: Secondary | ICD-10-CM | POA: Diagnosis not present

## 2023-04-12 DIAGNOSIS — E1169 Type 2 diabetes mellitus with other specified complication: Secondary | ICD-10-CM | POA: Diagnosis not present

## 2023-05-10 DIAGNOSIS — I10 Essential (primary) hypertension: Secondary | ICD-10-CM | POA: Diagnosis not present

## 2023-05-10 DIAGNOSIS — Z6839 Body mass index (BMI) 39.0-39.9, adult: Secondary | ICD-10-CM | POA: Diagnosis not present

## 2023-05-10 DIAGNOSIS — E1169 Type 2 diabetes mellitus with other specified complication: Secondary | ICD-10-CM | POA: Diagnosis not present

## 2023-05-16 ENCOUNTER — Telehealth: Payer: Self-pay | Admitting: Internal Medicine

## 2023-05-16 NOTE — Telephone Encounter (Signed)
Review chart. Longstanding hx of sleep apnea. C-pap orders signed and faxed to [facility]. Directions:  PAP pressure setting:11 cm H20.  MJB, MD

## 2023-05-23 ENCOUNTER — Other Ambulatory Visit: Payer: Self-pay | Admitting: Internal Medicine

## 2023-06-06 DIAGNOSIS — M1712 Unilateral primary osteoarthritis, left knee: Secondary | ICD-10-CM | POA: Diagnosis not present

## 2023-06-07 DIAGNOSIS — Z6839 Body mass index (BMI) 39.0-39.9, adult: Secondary | ICD-10-CM | POA: Diagnosis not present

## 2023-06-07 DIAGNOSIS — I10 Essential (primary) hypertension: Secondary | ICD-10-CM | POA: Diagnosis not present

## 2023-06-07 DIAGNOSIS — E1169 Type 2 diabetes mellitus with other specified complication: Secondary | ICD-10-CM | POA: Diagnosis not present

## 2023-06-14 NOTE — Progress Notes (Addendum)
COVID Vaccine received:  []  No [x]  Yes Date of any COVID positive Test in last 90 days:None  PCP - [name]. [name], MD Cardiologist - Dietrich Pates, MD   Jari Favre, PA-C cardiac clearance 01-13-23 note Hematology- Si Gaul, MD  Chest x-ray - remote 2017  epic EKG -  10-27-2022 Epic Stress Test -  ECHO - 11-30-2022  Epic  Cardiac Cath - 2007  Zio Monitor- 02-24-2021  Epic  PCR screen: [x]  Ordered & Completed           []   No Order but Needs PROFEND           []   N/A for this surgery  Surgery Plan:  [x]  Ambulatory                            []  Outpatient in bed                            []  Admit  Anesthesia:    []  General  [x]  Spinal                           []   Choice []   MAC  Pacemaker / ICD device [x]  No []  Yes   Spinal Cord Stimulator:[x]  No []  Yes       History of Sleep Apnea? []  No [x]  Yes   CPAP used?- []  No [x]  Yes    Does the patient monitor blood sugar?          []  No [x]  Yes  []  N/A Last A1c was 6.4 on 02-24-2023 Patient has: []  NO Hx DM   []  Pre-DM                 []  DM1  [x]   DM2 Does patient have a [employer] or Dexacom? [x]  No []  Yes   Fasting Blood Sugar Ranges-  Checks Blood Sugar _1 times a week  GLP1 agonist / usual dose - Ozempic injection on Thursday   GLP1 instructions: Hold minium of 7 days prior to surgery Diabetic medications/ instructions: Metformin 500 mg q day:   Hold DOS Januvia 100 mg q day : Hold DOS  Blood Thinner / Instructions:None Aspirin Instructions:  ASA 81 mg: Hold 5-7 days per Jari Favre, PA-C 01-13-23 note,  Patient aware  ERAS Protocol Ordered: []  No  [x]  Yes PRE-SURGERY []  ENSURE  [x]  G2  Patient is to be NPO after: 0430  Comments: Patient was given the 5 CHG shower / bath instructions for Unicompartmental Knee arthroplasty surgery along with 2 bottles of the CHG soap. Patient will start this on:   Friday 06-24-23 All questions were asked and answered, Patient voiced understanding of this process.    Activity level:  Patient is able to climb a flight of stairs without difficulty; [x]  No CP  [x]  No SOB, but would have knee pain.  Patient can perform ADLs without assistance.   Anesthesia review: DM2, OSA-CPAP (11cm H2O), gout, CAD (cath 2007), Ckd,  anemia- hx GI bleed, MGUS, S/p prostatectomy for Cancer.   Patient denies shortness of breath, fever, cough and chest pain at PAT appointment.  Patient verbalized understanding and agreement to the Pre-Surgical Instructions that were given to them at this PAT appointment. Patient was also educated of the need to review these PAT instructions again prior to his surgery.I reviewed the appropriate  phone numbers to call if they have any and questions or concerns.

## 2023-06-14 NOTE — Patient Instructions (Addendum)
SURGICAL WAITING ROOM VISITATION Patients having surgery or a procedure may have no more than 2 support people in the waiting area - these visitors may rotate in the visitor waiting room.   Due to an increase in RSV and influenza rates and associated hospitalizations, children ages 68 and under may not visit patients in [facility] hospitals. If the patient needs to stay at the hospital during part of their recovery, the visitor guidelines for inpatient rooms apply.  PRE-OP VISITATION  Pre-op nurse will coordinate an appropriate time for 1 support person to accompany the patient in pre-op.  This support person may not rotate.  This visitor will be contacted when the time is appropriate for the visitor to come back in the pre-op area.  Please refer to the [facility] website for the visitor guidelines for Inpatients (after your surgery is over and you are in a regular room).  You are not required to quarantine at this time prior to your surgery. However, you must do this: Hand Hygiene often Do NOT share personal items Notify your provider if you are in close contact with someone who has COVID or you develop fever 100.4 or greater, new onset of sneezing, cough, sore throat, shortness of breath or body aches.  If you test positive for Covid or have been in contact with anyone that has tested positive in the last 10 days please notify you surgeon.    Your procedure is scheduled on:  Tuesday  June 28, 2023  Report to [facility] Main Entrance: [place] entrance where the [employer] is available.   Report to admitting at:  05:15   AM  Call this number if you have any questions or problems the morning of surgery [phone]  Do not eat food after Midnight the night prior to your surgery/procedure.  After Midnight you may have the following liquids until  04:30  AM DAY OF SURGERY  Clear Liquid Diet Water Black Coffee (sugar ok, NO MILK/CREAM OR CREAMERS)  Tea (sugar ok, NO  MILK/CREAM OR CREAMERS) regular and decaf                             Plain Jell-O  with no fruit (NO RED)                                           Fruit ices (not with fruit pulp, NO RED)                                     Popsicles (NO RED)                                                                  Juice: NO CITRUS JUICES: only apple, WHITE grape, WHITE cranberry Sports drinks like Gatorade or Powerade (NO RED)                     The day of surgery:  Drink ONE (1) Pre-Surgery  G2 at 04:30  AM the morning  of surgery. Drink in one sitting. Do not sip.  This drink was given to you during your hospital pre-op appointment visit. Nothing else to drink after completing the Pre-Surgery G2 : No candy, chewing gum or throat lozenges.    FOLLOW  ANY ADDITIONAL PRE OP INSTRUCTIONS YOU RECEIVED FROM YOUR SURGEON'S OFFICE!!!   Oral Hygiene is also important to reduce your risk of infection.        Remember - BRUSH YOUR TEETH THE MORNING OF SURGERY WITH YOUR REGULAR TOOTHPASTE  Do NOT smoke after Midnight the night before surgery.  STOP TAKING all Vitamins, Herbs and supplements 1 week before your surgery.   Stop taking your Aspirin 81mg  5-7 days before your surgery.   Diabetic Medication:   Ozempic injection on Thursday;     Hold minimum of 7 days prior to surgery: last dose will be on 06-16-2023 Metformin 500 mg q day: Day BEFORE surgery: take as usual.   DON'T TAKE the Day of Surgery Januvia 100 mg q day : Day BEFORE surgery: take as usual.   DON'T TAKE the Day of Surgery    Take ONLY these medicines the morning of surgery with A SIP OF WATER: Metoprolol, allopurinol, esomeprazole (Nexium).    If You have been diagnosed with Sleep Apnea - Bring CPAP mask and tubing day of surgery. We will provide you with a CPAP machine on the day of your surgery.                   You may not have any metal on your body including jewelry, and body piercing  Do not wear lotions, powders,  cologne, or deodorant  Men may shave face and neck.  Contacts, Hearing Aids, dentures or bridgework may not be worn into surgery. DENTURES WILL BE REMOVED PRIOR TO SURGERY PLEASE DO NOT APPLY "Poly grip" OR ADHESIVES!!!   Patients discharged on the day of surgery will not be allowed to drive home.  Someone NEEDS to stay with you for the first 24 hours after anesthesia.  Do not bring your home medications to the hospital. The Pharmacy will dispense medications listed on your medication list to you during your admission in the Hospital.  Special Instructions: Bring a copy of your healthcare power of attorney and living will documents the day of surgery, if you wish to have them scanned into your [facility]- EPIC  Please read over the following fact sheets you were given: IF YOU HAVE QUESTIONS ABOUT YOUR PRE-OP INSTRUCTIONS, PLEASE CALL [phone].       Pre-operative 5 CHG Bath Instructions   You can play a key role in reducing the risk of infection after surgery. Your skin needs to be as free of germs as possible. You can reduce the number of germs on your skin by washing with CHG (chlorhexidine gluconate) soap before surgery. CHG is an antiseptic soap that kills germs and continues to kill germs even after washing.   DO NOT use if you have an allergy to chlorhexidine/CHG or antibacterial soaps. If your skin becomes reddened or irritated, stop using the CHG and notify one of our RNs at [phone]  Please shower with the CHG soap starting 4 days before surgery using the following schedule: START SHOWERS ON FRIDAY  June 24, 2023  Please keep in mind the following:  DO NOT shave, including legs and underarms, starting the day of your first shower.   You may shave your face at any point  before/day of surgery.   Place clean sheets on your bed the day you start using CHG soap. Use a clean washcloth (not used since being washed) for each shower. DO NOT sleep with pets once you start using the CHG.   CHG Shower Instructions:  If you choose to wash your hair and private area, wash first with your normal shampoo/soap.  After you use shampoo/soap, rinse your hair and body thoroughly to remove shampoo/soap residue.  Turn the water OFF and apply about 3 tablespoons (45 ml) of CHG soap to a CLEAN washcloth.  Apply CHG soap ONLY FROM YOUR NECK DOWN TO YOUR TOES (washing for 3-5 minutes)  DO NOT use CHG soap on face, private areas, open wounds, or sores.  Pay special attention to the area where your surgery is being performed.  If you are having back surgery, having someone wash your back for you may be helpful.  Wait 2 minutes after CHG soap is applied, then you may rinse off the CHG soap.  Pat dry with a clean towel  Put on clean clothes/pajamas   If you choose to wear lotion, please use ONLY the CHG-compatible lotions on the back of this paper.     Additional instructions for the day of surgery: DO NOT APPLY any lotions, deodorants, cologne, or perfumes.   Put on clean/comfortable clothes.  Brush your teeth.  Ask your nurse before applying any prescription medications to the skin.      CHG Compatible Lotions   Aveeno Moisturizing lotion  Cetaphil Moisturizing Cream  Cetaphil Moisturizing Lotion  Clairol Herbal Essence Moisturizing Lotion, Dry Skin  Clairol Herbal Essence Moisturizing Lotion, Extra Dry Skin  Clairol Herbal Essence Moisturizing Lotion, Normal Skin  Curel Age Defying Therapeutic Moisturizing Lotion with Alpha Hydroxy  Curel Extreme Care Body Lotion  Curel Soothing Hands Moisturizing Hand Lotion  Curel Therapeutic Moisturizing Cream, Fragrance-Free  Curel Therapeutic Moisturizing Lotion, Fragrance-Free  Curel Therapeutic Moisturizing Lotion, Original  Formula  Eucerin Daily Replenishing Lotion  Eucerin Dry Skin Therapy Plus Alpha Hydroxy Crme  Eucerin Dry Skin Therapy Plus Alpha Hydroxy Lotion  Eucerin Original Crme  Eucerin Original Lotion  Eucerin Plus Crme Eucerin Plus Lotion  Eucerin TriLipid Replenishing Lotion  Keri Anti-Bacterial Hand Lotion  Keri Deep Conditioning Original Lotion Dry Skin Formula Softly Scented  Keri Deep Conditioning Original Lotion, Fragrance Free Sensitive Skin Formula  Keri Lotion Fast Absorbing Fragrance Free Sensitive Skin Formula  Keri Lotion Fast Absorbing Softly Scented Dry Skin Formula  Keri Original Lotion  Keri Skin Renewal Lotion Keri Silky Smooth Lotion  Keri Silky Smooth Sensitive Skin Lotion  Nivea Body Creamy Conditioning Oil  Nivea Body Extra Enriched Lotion  Nivea Body Original Lotion  Nivea Body Sheer Moisturizing Lotion Nivea Crme  Nivea Skin Firming Lotion  NutraDerm 30 Skin Lotion  NutraDerm Skin Lotion  NutraDerm Therapeutic Skin Cream  NutraDerm Therapeutic Skin Lotion  ProShield Protective Hand Cream  Provon moisturizing lotion   FAILURE TO FOLLOW THESE INSTRUCTIONS MAY RESULT IN THE CANCELLATION OF YOUR SURGERY  PATIENT SIGNATURE_________________________________  NURSE SIGNATURE__________________________________  ______________________________________________________________________     Rogelia Mire    An incentive spirometer is a tool that can help keep your lungs clear and active. This tool measures how well you are filling your lungs with each breath. Taking long deep  breaths may help reverse or decrease the chance of developing breathing (pulmonary) problems (especially infection) following: A long period of time when you are unable to move or be active. BEFORE THE PROCEDURE  If the spirometer includes an indicator to show your best effort, your nurse or respiratory therapist will set it to a desired goal. If possible, sit up straight or lean  slightly forward. Try not to slouch. Hold the incentive spirometer in an upright position. INSTRUCTIONS FOR USE  Sit on the edge of your bed if possible, or sit up as far as you can in bed or on a chair. Hold the incentive spirometer in an upright position. Breathe out normally. Place the mouthpiece in your mouth and seal your lips tightly around it. Breathe in slowly and as deeply as possible, raising the piston or the ball toward the top of the column. Hold your breath for 3-5 seconds or for as long as possible. Allow the piston or ball to fall to the bottom of the column. Remove the mouthpiece from your mouth and breathe out normally. Rest for a few seconds and repeat Steps 1 through 7 at least 10 times every 1-2 hours when you are awake. Take your time and take a few normal breaths between deep breaths. The spirometer may include an indicator to show your best effort. Use the indicator as a goal to work toward during each repetition. After each set of 10 deep breaths, practice coughing to be sure your lungs are clear. If you have an incision (the cut made at the time of surgery), support your incision when coughing by placing a pillow or rolled up towels firmly against it. Once you are able to get out of bed, walk around indoors and cough well. You may stop using the incentive spirometer when instructed by your caregiver.  RISKS AND COMPLICATIONS Take your time so you do not get dizzy or light-headed. If you are in pain, you may need to take or ask for pain medication before doing incentive spirometry. It is harder to take a deep breath if you are having pain. AFTER USE Rest and breathe slowly and easily. It can be helpful to keep track of a log of your progress. Your caregiver can provide you with a simple table to help with this. If you are using the spirometer at home, follow these instructions: SEEK MEDICAL CARE IF:  You are having difficultly using the spirometer. You have trouble  using the spirometer as often as instructed. Your pain medication is not giving enough relief while using the spirometer. You develop fever of 100.5 F (38.1 C) or higher.                                                                                                    SEEK IMMEDIATE MEDICAL CARE IF:  You cough up bloody sputum that had not been present before. You develop fever of 102 F (38.9 C) or greater. You develop worsening pain at or near the incision site. MAKE SURE YOU:  Understand these instructions. Will watch your condition. Will get  help right away if you are not doing well or get worse. Document Released: 03/14/2007 Document Revised: 01/24/2012 Document Reviewed: 05/15/2007 [facility] Patient Information 2014 [place], [place].

## 2023-06-15 ENCOUNTER — Encounter (HOSPITAL_COMMUNITY)
Admission: RE | Admit: 2023-06-15 | Discharge: 2023-06-15 | Disposition: A | Discharge status: Home / Self Care | Payer: Medicare Other | Source: Ambulatory Visit | Attending: Orthopedic Surgery | Admitting: Orthopedic Surgery

## 2023-06-15 ENCOUNTER — Other Ambulatory Visit: Payer: Self-pay

## 2023-06-15 ENCOUNTER — Encounter (HOSPITAL_COMMUNITY): Payer: Self-pay

## 2023-06-15 VITALS — BP 110/62 | HR 74 | Temp 98.2°F | Resp 20 | Ht 69.0 in | Wt 268.0 lb

## 2023-06-15 DIAGNOSIS — Z01818 Encounter for other preprocedural examination: Secondary | ICD-10-CM

## 2023-06-15 DIAGNOSIS — G4733 Obstructive sleep apnea (adult) (pediatric): Secondary | ICD-10-CM | POA: Diagnosis not present

## 2023-06-15 DIAGNOSIS — Z7985 Long-term (current) use of injectable non-insulin antidiabetic drugs: Secondary | ICD-10-CM | POA: Diagnosis not present

## 2023-06-15 DIAGNOSIS — Z01812 Encounter for preprocedural laboratory examination: Secondary | ICD-10-CM | POA: Insufficient documentation

## 2023-06-15 DIAGNOSIS — I129 Hypertensive chronic kidney disease with stage 1 through stage 4 chronic kidney disease, or unspecified chronic kidney disease: Secondary | ICD-10-CM | POA: Diagnosis not present

## 2023-06-15 DIAGNOSIS — E1122 Type 2 diabetes mellitus with diabetic chronic kidney disease: Secondary | ICD-10-CM | POA: Insufficient documentation

## 2023-06-15 DIAGNOSIS — D472 Monoclonal gammopathy: Secondary | ICD-10-CM | POA: Insufficient documentation

## 2023-06-15 DIAGNOSIS — N189 Chronic kidney disease, unspecified: Secondary | ICD-10-CM | POA: Insufficient documentation

## 2023-06-15 DIAGNOSIS — E119 Type 2 diabetes mellitus without complications: Secondary | ICD-10-CM

## 2023-06-15 DIAGNOSIS — I2510 Atherosclerotic heart disease of native coronary artery without angina pectoris: Secondary | ICD-10-CM | POA: Insufficient documentation

## 2023-06-15 DIAGNOSIS — K219 Gastro-esophageal reflux disease without esophagitis: Secondary | ICD-10-CM | POA: Insufficient documentation

## 2023-06-15 DIAGNOSIS — Z7984 Long term (current) use of oral hypoglycemic drugs: Secondary | ICD-10-CM | POA: Diagnosis not present

## 2023-06-15 HISTORY — DX: Personal history of other diseases of the digestive system: Z87.19

## 2023-06-15 LAB — BASIC METABOLIC PANEL
Anion gap: 12 (ref 5–15)
BUN: 32 mg/dL — ABNORMAL HIGH (ref 8–23)
CO2: 21 mmol/L — ABNORMAL LOW (ref 22–32)
Calcium: 9.3 mg/dL (ref 8.9–10.3)
Chloride: 104 mmol/L (ref 98–111)
Creatinine, Ser: 1.41 mg/dL — ABNORMAL HIGH (ref 0.61–1.24)
Creatinine: 1.4 — AB (ref 0.6–1.3)
GFR, Estimated: 52 mL/min — ABNORMAL LOW (ref >60)
Glucose, Bld: 97 mg/dL (ref 70–99)
Potassium: 3.8 mmol/L (ref 3.5–5.1)
Sodium: 137 mmol/L (ref 135–145)

## 2023-06-15 LAB — CBC
HCT: 35.8 % — ABNORMAL LOW (ref 39.0–52.0)
Hemoglobin: 11.5 g/dL — ABNORMAL LOW (ref 13.0–17.0)
MCH: 31.2 pg (ref 26.0–34.0)
MCHC: 32.1 g/dL (ref 30.0–36.0)
MCV: 97 fL (ref 80.0–100.0)
Platelets: 267 10*3/uL (ref 150–400)
RBC: 3.69 MIL/uL — ABNORMAL LOW (ref 4.22–5.81)
RDW: 14.9 % (ref 11.5–15.5)
WBC: 11.9 10*3/uL — ABNORMAL HIGH (ref 4.0–10.5)
nRBC: 0 % (ref 0.0–0.2)

## 2023-06-15 LAB — SURGICAL PCR SCREEN
MRSA, PCR: NEGATIVE
Staphylococcus aureus: NEGATIVE

## 2023-06-15 LAB — GLUCOSE, CAPILLARY: Glucose-Capillary: 97 mg/dL (ref 70–99)

## 2023-06-15 NOTE — Care Plan (Signed)
Ortho Bundle Case Management Note  Patient Details  Name: Leonard [name]. MRN: [id number] Date of Birth: November 14, 1946  patient seen in the office with PA for H&P. He will discharge to home with family to assist. rolling walker ordered for home use. OPPT set up with [facility]. discharge instructions discussed and patient has no questions today. will mail packet to him. Patient and MD in agreement with plan. Choice offered.    DME Arranged:  Dan Humphreys rolling DME Agency:  [employer]  HH Arranged:    HH Agency:     Additional Comments: Please contact me with any questions of if this plan should need to change.  Shauna Hugh,  RN,BSN,MHA,CCM  [facility] Orthopaedic Specialist  [phone] 06/15/2023, 2:54 PM

## 2023-06-17 ENCOUNTER — Encounter: Payer: Self-pay | Admitting: Internal Medicine

## 2023-06-17 ENCOUNTER — Encounter (HOSPITAL_COMMUNITY): Payer: Self-pay

## 2023-06-17 NOTE — Anesthesia Preprocedure Evaluation (Addendum)
Anesthesia Evaluation  Patient identified by MRN, date of birth, ID band Patient awake    Reviewed: Allergy & Precautions, NPO status , Patient's Chart, lab work & pertinent test results  Airway Mallampati: II  TM Distance: >3 FB Neck ROM: Full    Dental no notable dental hx.    Pulmonary sleep apnea and Continuous Positive Airway Pressure Ventilation    Pulmonary exam normal        Cardiovascular hypertension, Pt. on medications and Pt. on home beta blockers Normal cardiovascular exam  ECHO: 1. Left ventricular ejection fraction, by estimation, is 55 to 60%. The  left ventricle has normal function. The left ventricle has no regional  wall motion abnormalities. There is mild concentric left ventricular  hypertrophy. Left ventricular diastolic  parameters are consistent with Grade I diastolic dysfunction (impaired  relaxation).   2. Right ventricular systolic function is normal. The right ventricular  size is normal. There is normal pulmonary artery systolic pressure. The  estimated right ventricular systolic pressure is 20.1 mmHg.   3. The mitral valve is normal in structure. Trivial mitral valve  regurgitation. No evidence of mitral stenosis.   4. The aortic valve is tricuspid. There is mild calcification of the  aortic valve. Aortic valve regurgitation is not visualized. No aortic  stenosis is present.   5. Aortic dilatation noted. There is mild dilatation of the ascending  aorta, measuring 39 mm.   6. The inferior vena cava is normal in size with greater than 50%  respiratory variability, suggesting right atrial pressure of 3 mmHg.     Neuro/Psych negative neurological ROS  negative psych ROS   GI/Hepatic Neg liver ROS, hiatal hernia,GERD  Medicated and Controlled,,  Endo/Other  diabetes, Oral Hypoglycemic Agents  Patient on GLP-1 Agonist  Renal/GU Renal InsufficiencyRenal disease     Musculoskeletal  (+)  Arthritis , Osteoarthritis,  Gout   Abdominal  (+) + obese  Peds  Hematology  (+) Blood dyscrasia, anemia   Anesthesia Other Findings left knee DJD  Reproductive/Obstetrics                             Anesthesia Physical Anesthesia Plan  ASA: 3  Anesthesia Plan: Spinal and Regional   Post-op Pain Management: Regional block*   Induction: Intravenous  PONV Risk Score and Plan: 1 and Ondansetron, Dexamethasone, Propofol infusion, Midazolam and Treatment may vary due to age or medical condition  Airway Management Planned: Simple Face Mask  Additional Equipment:   Intra-op Plan:   Post-operative Plan:   Informed Consent: I have reviewed the patients History and Physical, chart, labs and discussed the procedure including the risks, benefits and alternatives for the proposed anesthesia with the patient or authorized representative who has indicated his/her understanding and acceptance.     Dental advisory given  Plan Discussed with: CRNA  Anesthesia Plan Comments: (PAT note from 7/31 by Sherlie Ban PA-C )        Anesthesia Quick Evaluation

## 2023-06-17 NOTE — Progress Notes (Signed)
Case: [id number] Date/Time: 06/28/23 0715   Procedure: UNICOMPARTMENTAL KNEE (Left: Knee)   Anesthesia type: Spinal   Pre-op diagnosis: left knee DJD   Location: [facility] 07 / [facility]   Surgeons: Teryl Lucy, MD       DISCUSSION: Leonard [name] is a 77 yo male who presents to PAT prior to surgery above. PMH significant for T2DM, HTN, OSA (uses CPAP), GERD, IDA, CAD (mild by cath in 2007), MGUS, CKD, hiatal hernia.  No prior anesthesia complications.  Patient follows with Cardiology for mild CAD. Preop risk asessment clearance obtained on 01/13/23:   "Mr. Fadden's perioperative risk of a major cardiac event is 0.9% according to the Revised Cardiac Risk Index (RCRI).  Therefore, he is at low risk for perioperative complications.   His functional capacity is good at 5.07 METs according to the Duke Activity Status Index (DASI). Recommendations: According to ACC/AHA guidelines, no further cardiovascular testing needed.  The patient may proceed to surgery at acceptable risk.   Antiplatelet and/or Anticoagulation Recommendations: Aspirin can be held for 5-7 days prior to his surgery.  Please resume Aspirin post operatively when it is felt to be safe from a bleeding standpoint. "  He follows with Oncology for MGUS and IDA. He has been recieveding periodic iron infusions. Per Dr. Sofie Hartigan on 02/01/23: "I think the patient will be okay to proceed with the knee replacement from the oncology standpoint." Hgb is 11.5 on PAT labs.  Follows with PCP for other chronic medical problems. Last saw Dr. [name] on 01/14/23. All medical issues stable/controlled. HgA1c is 6.2. Pt takes Ozempic - last day is 8/1.   VS: BP 110/62 Comment: Right arm sitting  Pulse 74   Temp 36.8 C   Resp 20   Ht 5\' 9"  (1.753 m)   Wt 121.6 kg   SpO2 97%   BMI 39.58 kg/m   PROVIDERS: Margaree Mackintosh, MD Oncology: Despina Hick, MD Cardiology: Dietrich Pates, MD  LABS: Labs reviewed: Acceptable for surgery. (all labs ordered  are listed, but only abnormal results are displayed)  Labs Reviewed  HEMOGLOBIN A1C - Abnormal; Notable for the following components:      Result Value   Hgb A1c MFr Bld 6.2 (*)    All other components within normal limits  BASIC METABOLIC PANEL - Abnormal; Notable for the following components:   CO2 21 (*)    BUN 32 (*)    Creatinine, Ser 1.41 (*)    GFR, Estimated 52 (*)    All other components within normal limits  CBC - Abnormal; Notable for the following components:   WBC 11.9 (*)    RBC 3.69 (*)    Hemoglobin 11.5 (*)    HCT 35.8 (*)    All other components within normal limits  SURGICAL PCR SCREEN  GLUCOSE, CAPILLARY     IMAGES:   EKG:   CV:  Echo 11/30/22: IMPRESSIONS     1. Left ventricular ejection fraction, by estimation, is 55 to 60%. The  left ventricle has normal function. The left ventricle has no regional  wall motion abnormalities. There is mild concentric left ventricular  hypertrophy. Left ventricular diastolic  parameters are consistent with Grade I diastolic dysfunction (impaired  relaxation).   2. Right ventricular systolic function is normal. The right ventricular  size is normal. There is normal pulmonary artery systolic pressure. The  estimated right ventricular systolic pressure is 20.1 mmHg.   3. The mitral valve is normal in structure. Trivial  mitral valve  regurgitation. No evidence of mitral stenosis.   4. The aortic valve is tricuspid. There is mild calcification of the  aortic valve. Aortic valve regurgitation is not visualized. No aortic  stenosis is present.   5. Aortic dilatation noted. There is mild dilatation of the ascending  aorta, measuring 39 mm.   6. The inferior vena cava is normal in size with greater than 50%  respiratory variability, suggesting right atrial pressure of 3 mmHg.    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 09/05/2006   EGD   CKD (chronic kidney  disease)    Coronary artery disease    Diverticulosis of colon (without mention of hemorrhage) 09/05/2006   Colonoscopy    Gastric polyp 2014   done at [facility]   GERD (gastroesophageal reflux disease) 07/26/2001   EGD(Chronic)   Gouty arthropathy    Hemorrhoids    History of hiatal hernia    History of kidney stones    Hx of colonic polyps 09/05/2006   Colonoscopy(ADENOMATOUS POLYP)   Iron deficiency anemia, unspecified    Monoclonal gammopathy    OSA (obstructive sleep apnea)    USES CPAP MACHINE    Other and unspecified hyperlipidemia    Prostate cancer (HCC) 2015   Type II or unspecified type diabetes mellitus without mention of complication, not stated as uncontrolled    Unspecified essential hypertension     Past Surgical History:  Procedure Laterality Date   COLONOSCOPY     ESOPHAGOGASTRODUODENOSCOPY     Done at [facility]   ESOPHAGOGASTRODUODENOSCOPY (EGD) WITH PROPOFOL N/A 11/04/2017   Procedure: ESOPHAGOGASTRODUODENOSCOPY (EGD) WITH PROPOFOL;  Surgeon: Beverley Fiedler, MD;  Location: [facility];  Service: Gastroenterology;  Laterality: N/A;   GI PROSTATE BIOPSY     HEMORRHOID SURGERY     INCISIONAL HERNIA REPAIR N/A 08/07/2015   Procedure: LAPAROSCOPIC INCISIONAL HERNIA repair with mesh;  Surgeon: De Blanch Kinsinger, MD;  Location: [facility];  Service: General;  Laterality: N/A;   KNEE SURGERY  1999   TUMOR REMOVED LEFT KNEE & ARTHROSCOPIC SURGERY   LYMPHADENECTOMY Bilateral 05/01/2014   Procedure: LYMPHADENECTOMY;  Surgeon: Sebastian Ache, MD;  Location: [facility];  Service: Urology;  Laterality: Bilateral;   MASS EXCISION Right 08/28/2020   Procedure: EXCISION RIGHT SHOULDER MASS;  Surgeon: Almond Lint, MD;  Location: [facility];  Service: General;  Laterality: Right;   ROBOT ASSISTED LAPAROSCOPIC RADICAL PROSTATECTOMY N/A 05/01/2014   Procedure: ROBOTIC ASSISTED LAPAROSCOPIC RADICAL PROSTATECTOMY WITH INDOCYANINE GREEN DYE;  Surgeon: Sebastian Ache, MD;  Location: [facility];  Service:  Urology;  Laterality: N/A;   UPPER GASTROINTESTINAL ENDOSCOPY      MEDICATIONS:  allopurinol (ZYLOPRIM) 300 MG tablet   amLODipine-benazepril (LOTREL) 10-20 MG capsule   aspirin 81 MG tablet   atorvastatin (LIPITOR) 10 MG tablet   esomeprazole (NEXIUM) 20 MG capsule   FeFum-FePoly-FA-B Cmp-C-Biot (FOLIVANE-PLUS) CAPS   JANUVIA 100 MG tablet   metFORMIN (GLUCOPHAGE) 500 MG tablet   methylPREDNISolone (MEDROL) 4 MG tablet   metoprolol succinate (TOPROL-XL) 25 MG 24 hr tablet   nitroGLYCERIN (NITROSTAT) 0.4 MG SL tablet   OZEMPIC, 0.25 OR 0.5 MG/DOSE, 2 MG/3ML SOPN   triamterene-hydrochlorothiazide (MAXZIDE-25) 37.5-25 MG tablet   No current facility-administered medications for this encounter.   Marcille Blanco [facility] Short Stay/Anesthesiology [facility] Phone [phone] 06/17/2023 9:46 AM

## 2023-06-28 ENCOUNTER — Ambulatory Visit (HOSPITAL_COMMUNITY)
Admission: RE | Admit: 2023-06-28 | Discharge: 2023-06-28 | Disposition: A | Discharge status: Home / Self Care | Payer: Medicare Other | Source: Ambulatory Visit | Attending: Orthopedic Surgery | Admitting: Orthopedic Surgery

## 2023-06-28 ENCOUNTER — Ambulatory Visit (HOSPITAL_BASED_OUTPATIENT_CLINIC_OR_DEPARTMENT_OTHER): Payer: Medicare Other | Admitting: Anesthesiology

## 2023-06-28 ENCOUNTER — Encounter (HOSPITAL_COMMUNITY): Payer: Self-pay | Admitting: Orthopedic Surgery

## 2023-06-28 ENCOUNTER — Encounter (HOSPITAL_COMMUNITY)
Admission: RE | Disposition: A | Discharge status: Home / Self Care | Payer: Self-pay | Source: Ambulatory Visit | Attending: Orthopedic Surgery

## 2023-06-28 ENCOUNTER — Other Ambulatory Visit: Payer: Self-pay

## 2023-06-28 ENCOUNTER — Ambulatory Visit (HOSPITAL_COMMUNITY): Payer: Medicare Other | Admitting: Medical

## 2023-06-28 ENCOUNTER — Ambulatory Visit (HOSPITAL_COMMUNITY): Payer: Medicare Other

## 2023-06-28 DIAGNOSIS — K219 Gastro-esophageal reflux disease without esophagitis: Secondary | ICD-10-CM | POA: Diagnosis not present

## 2023-06-28 DIAGNOSIS — Z7984 Long term (current) use of oral hypoglycemic drugs: Secondary | ICD-10-CM

## 2023-06-28 DIAGNOSIS — G4733 Obstructive sleep apnea (adult) (pediatric): Secondary | ICD-10-CM | POA: Insufficient documentation

## 2023-06-28 DIAGNOSIS — M1712 Unilateral primary osteoarthritis, left knee: Secondary | ICD-10-CM

## 2023-06-28 DIAGNOSIS — I2510 Atherosclerotic heart disease of native coronary artery without angina pectoris: Secondary | ICD-10-CM | POA: Insufficient documentation

## 2023-06-28 DIAGNOSIS — E1122 Type 2 diabetes mellitus with diabetic chronic kidney disease: Secondary | ICD-10-CM | POA: Insufficient documentation

## 2023-06-28 DIAGNOSIS — N189 Chronic kidney disease, unspecified: Secondary | ICD-10-CM | POA: Diagnosis not present

## 2023-06-28 DIAGNOSIS — E119 Type 2 diabetes mellitus without complications: Secondary | ICD-10-CM

## 2023-06-28 DIAGNOSIS — I10 Essential (primary) hypertension: Secondary | ICD-10-CM | POA: Diagnosis not present

## 2023-06-28 DIAGNOSIS — Z01818 Encounter for other preprocedural examination: Secondary | ICD-10-CM

## 2023-06-28 DIAGNOSIS — Z7985 Long-term (current) use of injectable non-insulin antidiabetic drugs: Secondary | ICD-10-CM | POA: Diagnosis not present

## 2023-06-28 DIAGNOSIS — G8918 Other acute postprocedural pain: Secondary | ICD-10-CM | POA: Diagnosis not present

## 2023-06-28 DIAGNOSIS — I129 Hypertensive chronic kidney disease with stage 1 through stage 4 chronic kidney disease, or unspecified chronic kidney disease: Secondary | ICD-10-CM | POA: Diagnosis not present

## 2023-06-28 DIAGNOSIS — Z8546 Personal history of malignant neoplasm of prostate: Secondary | ICD-10-CM | POA: Insufficient documentation

## 2023-06-28 HISTORY — PX: PARTIAL KNEE ARTHROPLASTY: SHX2174

## 2023-06-28 LAB — GLUCOSE, CAPILLARY
Glucose-Capillary: 111 mg/dL — ABNORMAL HIGH (ref 70–99)
Glucose-Capillary: 90 mg/dL (ref 70–99)

## 2023-06-28 SURGERY — ARTHROPLASTY, KNEE, UNICOMPARTMENTAL
Anesthesia: Regional | Site: Knee | Laterality: Left

## 2023-06-28 MED ORDER — LACTATED RINGERS IV SOLN: INTRAVENOUS | Status: DC

## 2023-06-28 MED ORDER — POVIDONE-IODINE 7.5 % EX SOLN: Freq: Once | CUTANEOUS | Status: DC

## 2023-06-28 MED ORDER — EPHEDRINE SULFATE-NACL 50-0.9 MG/10ML-% IV SOSY
PREFILLED_SYRINGE | INTRAVENOUS | Status: DC | PRN
Start: 2023-06-28 — End: 2023-06-28
  Administered 2023-06-28: 10 mg via INTRAVENOUS

## 2023-06-28 MED ORDER — FENTANYL CITRATE PF 50 MCG/ML IJ SOSY: 25.0000 ug | PREFILLED_SYRINGE | INTRAMUSCULAR | Status: DC | PRN

## 2023-06-28 MED ORDER — OXYCODONE HCL 5 MG PO TABS
5.0000 mg | ORAL_TABLET | ORAL | Status: DC | PRN
  Administered 2023-06-28: 10 mg via ORAL

## 2023-06-28 MED ORDER — METHOCARBAMOL 500 MG IVPB - SIMPLE MED
500.0000 mg | Freq: Four times a day (QID) | INTRAVENOUS | Status: DC | PRN
  Administered 2023-06-28: 500 mg via INTRAVENOUS

## 2023-06-28 MED ORDER — KETOROLAC TROMETHAMINE 30 MG/ML IJ SOLN
INTRAMUSCULAR | Status: AC
  Filled 2023-06-28: qty 1

## 2023-06-28 MED ORDER — BACLOFEN 10 MG PO TABS: 10.0000 mg | ORAL_TABLET | Freq: Three times a day (TID) | ORAL | 0 refills | Status: AC

## 2023-06-28 MED ORDER — POVIDONE-IODINE 10 % EX SWAB: 2.0000 | Freq: Once | CUTANEOUS | Status: DC

## 2023-06-28 MED ORDER — TRANEXAMIC ACID-NACL 1000-0.7 MG/100ML-% IV SOLN
1000.0000 mg | INTRAVENOUS | Status: AC
  Administered 2023-06-28: 1000 mg via INTRAVENOUS
  Filled 2023-06-28: qty 100

## 2023-06-28 MED ORDER — HYDROMORPHONE HCL 1 MG/ML IJ SOLN
0.5000 mg | INTRAMUSCULAR | Status: DC | PRN
  Administered 2023-06-28: 1 mg via INTRAVENOUS

## 2023-06-28 MED ORDER — BUPIVACAINE-EPINEPHRINE (PF) 0.5% -1:200000 IJ SOLN
INTRAMUSCULAR | Status: DC | PRN
Start: 2023-06-28 — End: 2023-06-28
  Administered 2023-06-28: 30 mL via PERINEURAL

## 2023-06-28 MED ORDER — TRANEXAMIC ACID-NACL 1000-0.7 MG/100ML-% IV SOLN
1000.0000 mg | Freq: Once | INTRAVENOUS | Status: AC
  Administered 2023-06-28: 1000 mg via INTRAVENOUS

## 2023-06-28 MED ORDER — CEFAZOLIN SODIUM-DEXTROSE 2-4 GM/100ML-% IV SOLN
INTRAVENOUS | Status: AC
  Filled 2023-06-28: qty 100

## 2023-06-28 MED ORDER — ONDANSETRON HCL 4 MG/2ML IJ SOLN: 4.0000 mg | Freq: Once | INTRAMUSCULAR | Status: DC | PRN

## 2023-06-28 MED ORDER — STERILE WATER FOR IRRIGATION IR SOLN
Status: DC | PRN
  Administered 2023-06-28: 2000 mL

## 2023-06-28 MED ORDER — METHOCARBAMOL 500 MG IVPB - SIMPLE MED
INTRAVENOUS | Status: AC
  Filled 2023-06-28: qty 55

## 2023-06-28 MED ORDER — SENNA-DOCUSATE SODIUM 8.6-50 MG PO TABS: 2.0000 | ORAL_TABLET | Freq: Every day | ORAL | 1 refills | Status: AC

## 2023-06-28 MED ORDER — INSULIN ASPART 100 UNIT/ML IJ SOLN: 0.0000 [IU] | INTRAMUSCULAR | Status: DC | PRN

## 2023-06-28 MED ORDER — PHENYLEPHRINE HCL-NACL 20-0.9 MG/250ML-% IV SOLN
INTRAVENOUS | Status: DC | PRN
  Administered 2023-06-28: 50 ug/min via INTRAVENOUS

## 2023-06-28 MED ORDER — PROPOFOL 500 MG/50ML IV EMUL
INTRAVENOUS | Status: DC | PRN
  Administered 2023-06-28: 50 ug/kg/min via INTRAVENOUS

## 2023-06-28 MED ORDER — TRANEXAMIC ACID-NACL 1000-0.7 MG/100ML-% IV SOLN
INTRAVENOUS | Status: AC
  Filled 2023-06-28: qty 100

## 2023-06-28 MED ORDER — PHENYLEPHRINE 80 MCG/ML (10ML) SYRINGE FOR IV PUSH (FOR BLOOD PRESSURE SUPPORT)
PREFILLED_SYRINGE | INTRAVENOUS | Status: DC | PRN
  Administered 2023-06-28 (×2): 160 ug via INTRAVENOUS

## 2023-06-28 MED ORDER — BUPIVACAINE HCL (PF) 0.25 % IJ SOLN
INTRAMUSCULAR | Status: AC
  Filled 2023-06-28: qty 30

## 2023-06-28 MED ORDER — FENTANYL CITRATE (PF) 100 MCG/2ML IJ SOLN
INTRAMUSCULAR | Status: DC | PRN
  Administered 2023-06-28: 100 ug via INTRAVENOUS

## 2023-06-28 MED ORDER — PHENYLEPHRINE HCL-NACL 20-0.9 MG/250ML-% IV SOLN
INTRAVENOUS | Status: AC
  Filled 2023-06-28: qty 250

## 2023-06-28 MED ORDER — PHENYLEPHRINE 80 MCG/ML (10ML) SYRINGE FOR IV PUSH (FOR BLOOD PRESSURE SUPPORT)
PREFILLED_SYRINGE | INTRAVENOUS | Status: AC
  Filled 2023-06-28: qty 10

## 2023-06-28 MED ORDER — BUPIVACAINE IN DEXTROSE 0.75-8.25 % IT SOLN
INTRATHECAL | Status: DC | PRN
Start: 2023-06-28 — End: 2023-06-28
  Administered 2023-06-28: 2 mL via INTRATHECAL

## 2023-06-28 MED ORDER — ORAL CARE MOUTH RINSE: 15.0000 mL | Freq: Once | OROMUCOSAL | Status: AC

## 2023-06-28 MED ORDER — PROPOFOL 10 MG/ML IV BOLUS
INTRAVENOUS | Status: DC | PRN
Start: 2023-06-28 — End: 2023-06-28
  Administered 2023-06-28: 10 mg via INTRAVENOUS

## 2023-06-28 MED ORDER — CEFAZOLIN SODIUM-DEXTROSE 2-4 GM/100ML-% IV SOLN
2.0000 g | Freq: Four times a day (QID) | INTRAVENOUS | Status: DC
  Administered 2023-06-28: 2 g via INTRAVENOUS

## 2023-06-28 MED ORDER — HYDROMORPHONE HCL 1 MG/ML IJ SOLN
INTRAMUSCULAR | Status: AC
  Filled 2023-06-28: qty 1

## 2023-06-28 MED ORDER — OXYCODONE HCL 5 MG PO TABS
ORAL_TABLET | ORAL | Status: AC
  Filled 2023-06-28: qty 2

## 2023-06-28 MED ORDER — OXYCODONE HCL 5 MG PO TABS: 10.0000 mg | ORAL_TABLET | ORAL | Status: DC | PRN

## 2023-06-28 MED ORDER — METHOCARBAMOL 500 MG PO TABS: 500.0000 mg | ORAL_TABLET | Freq: Four times a day (QID) | ORAL | Status: DC | PRN

## 2023-06-28 MED ORDER — APIXABAN 2.5 MG PO TABS: 2.5000 mg | ORAL_TABLET | Freq: Two times a day (BID) | ORAL | 0 refills | Status: DC

## 2023-06-28 MED ORDER — SODIUM CHLORIDE 0.9 % IR SOLN
Status: DC | PRN
  Administered 2023-06-28: 1000 mL

## 2023-06-28 MED ORDER — BUPIVACAINE HCL 0.25 % IJ SOLN
INTRAMUSCULAR | Status: DC | PRN
  Administered 2023-06-28: 30 mL

## 2023-06-28 MED ORDER — CEFAZOLIN IN SODIUM CHLORIDE 3-0.9 GM/100ML-% IV SOLN
3.0000 g | INTRAVENOUS | Status: DC
  Filled 2023-06-28: qty 100

## 2023-06-28 MED ORDER — 0.9 % SODIUM CHLORIDE (POUR BTL) OPTIME
TOPICAL | Status: DC | PRN
  Administered 2023-06-28: 1000 mL

## 2023-06-28 MED ORDER — LACTATED RINGERS IV BOLUS: 250.0000 mL | Freq: Once | INTRAVENOUS | Status: DC

## 2023-06-28 MED ORDER — OXYCODONE HCL 5 MG PO TABS
5.0000 mg | ORAL_TABLET | ORAL | 0 refills | Status: DC | PRN
Start: 2023-06-28 — End: 2023-09-15

## 2023-06-28 MED ORDER — ACETAMINOPHEN 500 MG PO TABS
1000.0000 mg | ORAL_TABLET | Freq: Once | ORAL | Status: AC
  Administered 2023-06-28: 1000 mg via ORAL
  Filled 2023-06-28: qty 2

## 2023-06-28 MED ORDER — CHLORHEXIDINE GLUCONATE 0.12 % MT SOLN
15.0000 mL | Freq: Once | OROMUCOSAL | Status: AC
  Administered 2023-06-28: 15 mL via OROMUCOSAL

## 2023-06-28 MED ORDER — ONDANSETRON HCL 4 MG/2ML IJ SOLN
INTRAMUSCULAR | Status: AC
  Filled 2023-06-28: qty 2

## 2023-06-28 MED ORDER — EPHEDRINE 5 MG/ML INJ
INTRAVENOUS | Status: AC
  Filled 2023-06-28: qty 5

## 2023-06-28 MED ORDER — LACTATED RINGERS IV BOLUS
500.0000 mL | Freq: Once | INTRAVENOUS | Status: AC
  Administered 2023-06-28: 500 mL via INTRAVENOUS

## 2023-06-28 MED ORDER — DEXTROSE 5 % IV SOLN
INTRAVENOUS | Status: DC | PRN
  Administered 2023-06-28: 3 g via INTRAVENOUS

## 2023-06-28 MED ORDER — FENTANYL CITRATE (PF) 100 MCG/2ML IJ SOLN
INTRAMUSCULAR | Status: AC
  Filled 2023-06-28: qty 2

## 2023-06-28 MED ORDER — PROPOFOL 1000 MG/100ML IV EMUL
INTRAVENOUS | Status: AC
  Filled 2023-06-28: qty 100

## 2023-06-28 MED ORDER — AMISULPRIDE (ANTIEMETIC) 5 MG/2ML IV SOLN: 10.0000 mg | Freq: Once | INTRAVENOUS | Status: DC | PRN

## 2023-06-28 MED ORDER — PROPOFOL 500 MG/50ML IV EMUL
INTRAVENOUS | Status: AC
  Filled 2023-06-28: qty 50

## 2023-06-28 MED ORDER — LACTATED RINGERS IV BOLUS
250.0000 mL | Freq: Once | INTRAVENOUS | Status: AC
  Administered 2023-06-28: 250 mL via INTRAVENOUS

## 2023-06-28 MED ORDER — ONDANSETRON HCL 4 MG PO TABS: 4.0000 mg | ORAL_TABLET | Freq: Three times a day (TID) | ORAL | 0 refills | Status: AC | PRN

## 2023-06-28 MED ORDER — KETOROLAC TROMETHAMINE 30 MG/ML IJ SOLN
INTRAMUSCULAR | Status: DC | PRN
  Administered 2023-06-28: 30 mg

## 2023-06-28 SURGICAL SUPPLY — 59 items
ADH SKN CLS APL DERMABOND .7 (GAUZE/BANDAGES/DRESSINGS) ×1
BAG COUNTER SPONGE SURGICOUNT (BAG) IMPLANT
BAG SPEC THK2 15X12 ZIP CLS (MISCELLANEOUS) ×1
BAG SPNG CNTER NS LX DISP (BAG)
BAG ZIPLOCK 12X15 (MISCELLANEOUS) ×1 IMPLANT
BEARING MENISCAL TIBIAL 4 LG L (Orthopedic Implant) IMPLANT
BLADE SURG 15 STRL LF DISP TIS (BLADE) ×1 IMPLANT
BLADE SURG 15 STRL SS (BLADE) ×1
BNDG CMPR MED 15X6 ELC VLCR LF (GAUZE/BANDAGES/DRESSINGS) ×1
BNDG ELASTIC 6X15 VLCR STRL LF (GAUZE/BANDAGES/DRESSINGS) ×1 IMPLANT
BOWL SMART MIX CTS (DISPOSABLE) ×1 IMPLANT
BRNG TIB LRG 4 PHS 3 LT MEN (Orthopedic Implant) ×1 IMPLANT
CEMENT BONE R 1X40 (Cement) IMPLANT
CLSR STERI-STRIP ANTIMIC 1/2X4 (GAUZE/BANDAGES/DRESSINGS) ×1 IMPLANT
COVER SURGICAL LIGHT HANDLE (MISCELLANEOUS) ×1 IMPLANT
CUFF TOURN SGL QUICK 34 (TOURNIQUET CUFF) ×1
CUFF TRNQT CYL 34X4.125X (TOURNIQUET CUFF) ×1 IMPLANT
DERMABOND ADVANCED .7 DNX12 (GAUZE/BANDAGES/DRESSINGS) IMPLANT
DRAPE POUCH INSTRU U-SHP 10X18 (DRAPES) ×1 IMPLANT
DRAPE SHEET LG 3/4 BI-LAMINATE (DRAPES) ×1 IMPLANT
DRAPE U-SHAPE 47X51 STRL (DRAPES) ×1 IMPLANT
DRSG MEPILEX POST OP 4X8 (GAUZE/BANDAGES/DRESSINGS) ×1 IMPLANT
DURAPREP 26ML APPLICATOR (WOUND CARE) ×2 IMPLANT
ELECT REM PT RETURN 15FT ADLT (MISCELLANEOUS) ×1 IMPLANT
FACESHIELD WRAPAROUND (MASK) ×3
FACESHIELD WRAPAROUND OR TEAM (MASK) ×3 IMPLANT
GAUZE PAD ABD 8X10 STRL (GAUZE/BANDAGES/DRESSINGS) ×1 IMPLANT
GLOVE BIO SURGEON STRL SZ 6.5 (GLOVE) ×1 IMPLANT
GLOVE BIOGEL PI IND STRL 7.0 (GLOVE) ×1 IMPLANT
GLOVE BIOGEL PI IND STRL 8 (GLOVE) ×1 IMPLANT
GLOVE ORTHO TXT STRL SZ7.5 (GLOVE) ×1 IMPLANT
GOWN STRL SURGICAL XL XLNG (GOWN DISPOSABLE) ×2 IMPLANT
HANDPIECE INTERPULSE COAX TIP (DISPOSABLE) ×1
HOLDER FOLEY CATH W/STRAP (MISCELLANEOUS) IMPLANT
IMMOBILIZER KNEE 20 (SOFTGOODS) ×1
IMMOBILIZER KNEE 20 THIGH 36 (SOFTGOODS) IMPLANT
IMMOBILIZER KNEE 22 UNIV (SOFTGOODS) IMPLANT
KIT BASIN OR (CUSTOM PROCEDURE TRAY) ×1 IMPLANT
KIT TURNOVER KIT A (KITS) IMPLANT
NDL SAFETY ECLIP 18X1.5 (MISCELLANEOUS) ×1 IMPLANT
NS IRRIG 1000ML POUR BTL (IV SOLUTION) ×1 IMPLANT
PACK BLADE SAW RECIP 70 3 PT (BLADE) IMPLANT
PEG FEMORAL CEMENT STRL LRG (Knees) IMPLANT
PROTECTOR NERVE ULNAR (MISCELLANEOUS) ×1 IMPLANT
SET HNDPC FAN SPRY TIP SCT (DISPOSABLE) ×1 IMPLANT
SPIKE FLUID TRANSFER (MISCELLANEOUS) IMPLANT
SUT VIC AB 1 CT1 36 (SUTURE) ×1 IMPLANT
SUT VIC AB 2-0 CT1 27 (SUTURE) ×1
SUT VIC AB 2-0 CT1 TAPERPNT 27 (SUTURE) ×1 IMPLANT
SUT VIC AB 3-0 SH 27 (SUTURE) ×2
SUT VIC AB 3-0 SH 27X BRD (SUTURE) ×2 IMPLANT
SYR 3ML LL SCALE MARK (SYRINGE) ×1 IMPLANT
TOWEL OR 17X26 10 PK STRL BLUE (TOWEL DISPOSABLE) ×1 IMPLANT
TOWEL OR NON WOVEN STRL DISP B (DISPOSABLE) ×1 IMPLANT
TRAY FOLEY MTR SLVR 16FR STAT (SET/KITS/TRAYS/PACK) ×1 IMPLANT
TRAY TIBIAL OXFORD SZ D LF (Joint) IMPLANT
TUBE SUCTION HIGH CAP CLEAR NV (SUCTIONS) ×1 IMPLANT
WATER STERILE IRR 1000ML POUR (IV SOLUTION) ×1 IMPLANT
WRAP KNEE MAXI GEL POST OP (GAUZE/BANDAGES/DRESSINGS) ×1 IMPLANT

## 2023-06-28 NOTE — Transfer of Care (Signed)
Immediate Anesthesia Transfer of Care Note  Patient: Krishav Lassila.  Procedure(s) Performed: LEFT UNICOMPARTMENTAL KNEE (Left: Knee)  Patient Location: PACU  Anesthesia Type:Spinal  Level of Consciousness: sedated  Airway & Oxygen Therapy: Patient Spontanous Breathing and Patient connected to face mask oxygen  Post-op Assessment: Report given to RN and Post -op Vital signs reviewed and stable  Post vital signs: Reviewed and stable  Last Vitals:  Vitals Value Taken Time  BP    Temp    Pulse 79 06/28/23 0942  Resp 15 06/28/23 0942  SpO2 97 % 06/28/23 0942  Vitals shown include unfiled device data.  Last Pain:  Vitals:   06/28/23 0630  TempSrc: Oral         Complications: No notable events documented.

## 2023-06-28 NOTE — Discharge Instructions (Signed)

## 2023-06-28 NOTE — Interval H&P Note (Signed)
History and Physical Interval Note:  06/28/2023 7:19 AM  Leonard [name].  has presented today for surgery, with the diagnosis of left knee DJD.  The various methods of treatment have been discussed with the patient and family. After consideration of risks, benefits and other options for treatment, the patient has consented to  Procedure(s): UNICOMPARTMENTAL KNEE (Left) as a surgical intervention.  The patient's history has been reviewed, patient examined, no change in status, stable for surgery.  I have reviewed the patient's chart and labs.  Questions were answered to the patient's satisfaction.     [name]

## 2023-06-28 NOTE — Evaluation (Signed)
Physical Therapy Evaluation Patient Details Name: Leonard [name]. MRN: [id number] DOB: October 11, 1946 Today's Date: 06/28/2023  History of Present Illness  77 yo male presents to therapy s/p L unicompartmental knee arthoplasty on 06/28/2023 due to failure of conservative measures. Pt PMH includes but is not limited to: atrophic gastritis without menton of hemorrhage, CKD, CAD, diverticulosis, GERD, OSA on CPAP, HLD, prostate ca, and DM II.  Clinical Impression      Bond Goldfine. is a 77 y.o. male POD 0 s/p L unicompartmental knee arthoplasty. Patient reports IND with mobility at baseline. Patient is now limited by functional impairments (see PT problem list below) and requires CGA and min cues for transfers and gait with RW. Patient was able to ambulate 50 feet x 2 with RW and CGA and progressing to close S and cues for safe walker management. Patient educated on safe sequencing for stair mobility with R handrail only and with use of RW to navigate threshold to enter home, fall risk prevention, pain management and goal, use of CP/ICE, and car transfers  pt and spouse verbalized understanding of safe guarding position for people assisting with mobility. Patient instructed in exercises to facilitate ROM and circulation reviewed and HO provided. Patient will benefit from continued skilled PT interventions to address impairments and progress towards PLOF. Patient has met mobility goals at adequate level for discharge home with family support and OPPT services starting 8/16; will continue to follow if pt continues acute stay to progress towards Mod I goals.     If plan is discharge home, recommend the following: A little help with walking and/or transfers;A little help with bathing/dressing/bathroom;Assistance with cooking/housework;Help with stairs or ramp for entrance;Assist for transportation   Can travel by private vehicle        Equipment Recommendations Rolling walker (2 wheels) (provided and  adjusted at eval)  Recommendations for Other Services       Functional Status Assessment Patient has had a recent decline in their functional status and demonstrates the ability to make significant improvements in function in a reasonable and predictable amount of time.     Precautions / Restrictions Precautions Precautions: Knee;Fall Restrictions Weight Bearing Restrictions: No      Mobility  Bed Mobility Overal bed mobility: Needs Assistance Bed Mobility: Supine to Sit     Supine to sit: Supervision     General bed mobility comments: min cues    Transfers Overall transfer level: Needs assistance Equipment used: Rolling walker (2 wheels) Transfers: Sit to/from Stand Sit to Stand: Contact guard assist           General transfer comment: min cues for proper UE and AD placement    Ambulation/Gait Ambulation/Gait assistance: Contact guard assist Gait Distance (Feet): 50 Feet Assistive device: Rolling walker (2 wheels) Gait Pattern/deviations: Step-to pattern, Antalgic, Trunk flexed Gait velocity: decreased     General Gait Details: reliance on UE support to offload LLE in stance phase  Stairs Stairs: Yes Stairs assistance: Contact guard assist Stair Management: Two rails, One rail Right Number of Stairs: 4 General stair comments: trial with step navigation with B handrail, cues and CGA and then R handrail only with CGA and cues.  Wheelchair Mobility     Tilt Bed    Modified Rankin (Stroke Patients Only)       Balance Overall balance assessment: Needs assistance Sitting-balance support: Feet supported Sitting balance-Leahy Scale: Good     Standing balance support: Bilateral upper extremity supported, During functional activity,  Reliant on assistive device for balance Standing balance-Leahy Scale: Fair Standing balance comment: no UE support for static standing                             Pertinent Vitals/Pain Pain Assessment Pain  Assessment: 0-10 Pain Score: 6  Pain Location: L knee Pain Descriptors / Indicators: Aching, Constant, Discomfort, Operative site guarding Pain Intervention(s): Limited activity within patient's tolerance, Monitored during session, Premedicated before session, Repositioned, Ice applied    Home Living Family/patient expects to be discharged to:: Private residence Living Arrangements: Spouse/significant other Available Help at Discharge: Family Type of Home: House Home Access: Stairs to enter Entrance Stairs-Rails: None Entrance Stairs-Number of Steps: 1 Alternate Level Stairs-Number of Steps: flight Home Layout: Two level;Bed/bath upstairs Home Equipment: None      Prior Function Prior Level of Function : Independent/Modified Independent;Driving             Mobility Comments: IND with all ADLs. self care tasks, IADLs driving       Extremity/Trunk Assessment        Lower Extremity Assessment Lower Extremity Assessment: LLE deficits/detail LLE Deficits / Details: ankle DF/PF 5/5; SLR < 10 degree lag LLE Sensation: WNL    Cervical / Trunk Assessment Cervical / Trunk Assessment: Normal  Communication   Communication Communication: No apparent difficulties  Cognition Arousal: Alert Behavior During Therapy: WFL for tasks assessed/performed Overall Cognitive Status: Within Functional Limits for tasks assessed                                          General Comments      Exercises Total Joint Exercises Ankle Circles/Pumps: AROM, Both, 20 reps Quad Sets: AROM, Left, 5 reps Short Arc Quad: AROM, Left, 5 reps Heel Slides: AROM, Left, 5 reps Hip ABduction/ADduction: AROM, Left, 5 reps, Supine Straight Leg Raises: AROM, Left, 5 reps Knee Flexion: AROM, Left, 5 reps, Seated   Assessment/Plan    PT Assessment Patient needs continued PT services  PT Problem List Decreased strength;Decreased range of motion;Decreased activity tolerance;Decreased  balance;Decreased mobility;Pain       PT Treatment Interventions DME instruction;Gait training;Stair training;Functional mobility training;Therapeutic activities;Therapeutic exercise;Balance training;Neuromuscular re-education;Patient/family education;Modalities    PT Goals (Current goals can be found in the Care Plan section)  Acute Rehab PT Goals Patient Stated Goal: going to [facility] for aquatic excercise program PT Goal Formulation: With patient Time For Goal Achievement: 07/12/23 Potential to Achieve Goals: Good    Frequency 7X/week     Co-evaluation               AM-PAC PT "6 Clicks" Mobility  Outcome Measure Help needed turning from your back to your side while in a flat bed without using bedrails?: None Help needed moving from lying on your back to sitting on the side of a flat bed without using bedrails?: A Little Help needed moving to and from a bed to a chair (including a wheelchair)?: A Little Help needed standing up from a chair using your arms (e.g., wheelchair or bedside chair)?: A Little Help needed to walk in hospital room?: A Little Help needed climbing 3-5 steps with a railing? : A Little 6 Click Score: 19    End of Session Equipment Utilized During Treatment: Gait belt Activity Tolerance: Patient tolerated treatment well Patient left: in chair;with call  bell/phone within reach;with nursing/sitter in room;with family/visitor present Nurse Communication: Mobility status;Other (comment) (pt readiness for d/c from PT standpoint) PT Visit Diagnosis: Unsteadiness on feet (R26.81);Other abnormalities of gait and mobility (R26.89);Muscle weakness (generalized) (M62.81);Difficulty in walking, not elsewhere classified (R26.2);Pain Pain - Right/Left: Left Pain - part of body: Leg;Knee    Time: 2536-6440 PT Time Calculation (min) (ACUTE ONLY): 54 min   Charges:   PT Evaluation $PT Eval Low Complexity: 1 Low PT Treatments $Gait Training: 8-22 mins $Therapeutic  Exercise: 8-22 mins $Therapeutic Activity: 8-22 mins PT General Charges $$ ACUTE PT VISIT: 1 Visit         Johnny Bridge, PT Acute Rehab   Jacqualyn Posey 06/28/2023, 2:04 PM

## 2023-06-28 NOTE — Op Note (Signed)
06/28/2023  9:18 AM  PATIENT:  Leonard [name].    PRE-OPERATIVE DIAGNOSIS: Left knee primary localized osteoarthritis  POST-OPERATIVE DIAGNOSIS:  Same  PROCEDURE:  Unicompartmental Knee Arthroplasty  SURGEON:  [name], MD  PHYSICIAN ASSISTANT: Janine Ores, PA-C, present and scrubbed throughout the case, critical for completion in a timely fashion, and for retraction, instrumentation, and closure.  ANESTHESIA:   Spinal with adductor canal block  ESTIMATED BLOOD LOSS: Minimal  UNIQUE ASPECTS OF THE CASE: He had advanced eburnation medially with substantial hypertrophic osteophyte formation.  The undersurface of the patella on the lateral facet was well-preserved although he did have a moderate amount of femoral trochlear disease, but the real disease was medial.  The ACL was intact.  The lateral compartment looked intact.  The mobile-bearing had a slight tendency to externally rotate in full flexion, but felt stable.  The floor was well-balanced both in flexion and extension.  PREOPERATIVE INDICATIONS:  Leonard Mazon. is a  77 y.o. male with a diagnosis of left knee DJD who failed conservative measures and elected for surgical management.    The risks benefits and alternatives were discussed with the patient preoperatively including but not limited to the risks of infection, bleeding, nerve injury, cardiopulmonary complications, blood clots, the need for revision surgery, among others, and the patient was willing to proceed.  OPERATIVE IMPLANTS:  Implant Name Type Inv. Item Serial No. Manufacturer Lot No. LRB No. Used Action  CEMENT BONE R 1X40 - [id number] Cement CEMENT BONE R 1X40  ZIMMER RECON(ORTH,TRAU,BIO,SG) [id number] Left 1 Implanted  TRAY TIBIAL [employer] SZ D LF - [id number] Joint TRAY TIBIAL [employer] SZ D LF  ZIMMER RECON(ORTH,TRAU,BIO,SG) [id number] Left 1 Implanted  PEG FEMORAL CEMENT STRL LRG - [id number] Knees PEG FEMORAL CEMENT STRL LRG  ZIMMER  RECON(ORTH,TRAU,BIO,SG) [id number] Left 1 Implanted  BRNG TIB LRG 4 PHS 3 LT MEN - [id number] Orthopedic Implant BRNG TIB LRG 4 PHS 3 LT MEN  ZIMMER RECON(ORTH,TRAU,BIO,SG) [id number] Left 1 Implanted     OPERATIVE FINDINGS: Endstage grade 4 medial compartment osteoarthritis.  No significant lateral disease, the undersurface of the patella laterally was intact, he did have some femoral trochlear disease.  The medial facet of the patella was unloaded with the intra-articular correction of varus deformity.  The ACL was intact.  OPERATIVE PROCEDURE: The patient was brought to the operating room placed in the supine position. Anesthesia was administered. IV antibiotics were given. The lower extremity was placed in the legholder and prepped and draped in usual sterile fashion.  Time out was performed.  The leg was elevated and exsanguinated and the tourniquet was inflated. Anteromedial incision was performed, and I took care to preserve the MCL. Parapatellar incision was carried out, and the osteophytes were excised, along with the medial meniscus and a small portion of the fat pad.  The extra medullary tibial cutting jig was applied, using the spoon and the 4mm G-Clamp and the 2 mm shim, and I took care to protect the anterior cruciate ligament insertion and the tibial spine. The medial collateral ligament was also protected, and I resected my proximal tibia, matching the anatomic slope.   The proximal tibial bony cut was removed in one piece, and I turned my attention to the femur.  The intramedullary femoral rod was placed using the drill, and then using the appropriate reference, I assembled the femoral jig, setting my posterior cutting block. I resected my posterior femur, used the 0 spigot for the anterior femur,  and then measured my gap.   I then used the appropriate mill to match the extension gap to the flexion gap. The second milling was at a 4.  The gaps were then measured again with the  appropriate feeler gauges. Once I had balanced flexion and extension gaps, I then completed the preparation of the femur.  I milled off the anterior aspect of the distal femur to prevent impingement. I also exposed the tibia, and selected the above-named component, and then used the cutting jig to prepare the keel slot on the tibia. I also used the awl to curette out the bone to complete the preparation of the keel. The back wall was intact.  I then placed trial components, and it was found to have excellent motion, and appropriate balance.  I then cemented the components into place, cementing the tibia first, removing all excess cement, and then cementing the femur.  All loose cement was removed.  The real polyethylene insert was applied manually, and the knee was taken through functional range of motion, and found to have excellent stability and restoration of joint motion, with excellent balance.  The wounds were irrigated copiously, and the parapatellar tissue closed with Vicryl, followed by Vicryl for the subcutaneous tissue, with routine closure with Steri-Strips and sterile gauze.  The tourniquet was released, and the patient was awakened and extubated and returned to PACU in stable and satisfactory condition. There were no complications.

## 2023-06-28 NOTE — H&P (Signed)
KNEE ARTHROPLASTY ADMISSION H&P  Patient ID: Leonard [name]. MRN: [id number] DOB/AGE: 76-Jun-1947 76 y.o.  Chief Complaint: left knee pain.  Planned Procedure Date: 06/28/23 Medical Clearance by Sharlet Salina, MD Cardiac Clearance by Jari Favre, NP   HPI: Leonard Evelyn. is a 77 y.o. 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 The patient has a history of pain and functional disability in the left knee due to arthritis and has failed non-surgical conservative treatments for greater than 12 weeks to include NSAID's and/or analgesics, viscosupplementation injections, weight reduction as appropriate, and activity modification.  Onset of symptoms was gradual, starting 2 years ago with gradually worsening course since that time. 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  Patient currently rates pain at 7 out of 10 with activity. Patient has worsening of pain with activity and weight bearing and pain that interferes with activities of daily living.  Patient has evidence of joint space narrowing by imaging studies.  There is no active infection.  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 09/05/2006   EGD   CKD (chronic kidney disease)    Coronary artery disease    Diverticulosis of colon (without mention of hemorrhage) 09/05/2006   Colonoscopy    Gastric polyp 2014   done at [facility]   GERD (gastroesophageal reflux disease) 07/26/2001   EGD(Chronic)   Gouty arthropathy    Hemorrhoids    History of hiatal hernia    History of kidney stones    Hx of colonic polyps 09/05/2006   Colonoscopy(ADENOMATOUS POLYP)   Iron deficiency anemia, unspecified    Monoclonal gammopathy    OSA (obstructive sleep apnea)    USES CPAP MACHINE    Other and unspecified hyperlipidemia    Prostate cancer (HCC) 2015   Type II or unspecified type diabetes mellitus without mention of complication,  not stated as uncontrolled    Unspecified essential hypertension    Past Surgical History:  Procedure Laterality Date   COLONOSCOPY     ESOPHAGOGASTRODUODENOSCOPY     Done at [facility]   ESOPHAGOGASTRODUODENOSCOPY (EGD) WITH PROPOFOL N/A 11/04/2017   Procedure: ESOPHAGOGASTRODUODENOSCOPY (EGD) WITH PROPOFOL;  Surgeon: Beverley Fiedler, MD;  Location: [facility];  Service: Gastroenterology;  Laterality: N/A;   GI PROSTATE BIOPSY     HEMORRHOID SURGERY     INCISIONAL HERNIA REPAIR N/A 08/07/2015   Procedure: LAPAROSCOPIC INCISIONAL HERNIA repair with mesh;  Surgeon: De Blanch Kinsinger, MD;  Location: [facility];  Service: General;  Laterality: N/A;   KNEE SURGERY  1999   TUMOR REMOVED LEFT KNEE & ARTHROSCOPIC SURGERY   LYMPHADENECTOMY Bilateral 05/01/2014   Procedure: LYMPHADENECTOMY;  Surgeon: Sebastian Ache, MD;  Location: [facility];  Service: Urology;  Laterality: Bilateral;   MASS EXCISION Right 08/28/2020   Procedure: EXCISION RIGHT SHOULDER MASS;  Surgeon: Almond Lint, MD;  Location: [facility];  Service: General;  Laterality: Right;   ROBOT ASSISTED LAPAROSCOPIC RADICAL PROSTATECTOMY N/A 05/01/2014   Procedure: ROBOTIC ASSISTED LAPAROSCOPIC RADICAL PROSTATECTOMY WITH INDOCYANINE GREEN DYE;  Surgeon: Sebastian Ache, MD;  Location: [facility];  Service: Urology;  Laterality: N/A;   UPPER GASTROINTESTINAL ENDOSCOPY     Allergies  Allergen Reactions   Dicloxacillin Rash and Other (See Comments)    Has patient had a PCN reaction causing immediate rash, facial/tongue/throat swelling, SOB or lightheadedness with hypotension: No Has patient had a PCN  reaction causing severe rash involving mucus membranes or skin necrosis: No Has patient had a PCN reaction that required hospitalization: No Has patient had a PCN reaction occurring within the last 10 years: No If all of the above answers are "NO", then may proceed with Cephalosporin use.    Cayenne Pepper [Cayenne] Swelling   Nsaids Other (See Comments)    PT  declines Acute renal failure 08/09/2015   Prior to Admission medications   Medication Sig Start Date End Date Taking? Authorizing Provider  allopurinol (ZYLOPRIM) 300 MG tablet TAKE 1 TABLET BY MOUTH DAILY TO PREVENT GOUT 01/08/23  Yes [name], [name], MD  amLODipine-benazepril (LOTREL) 10-20 MG capsule TAKE 1 CAPSULE BY MOUTH EVERY DAY 09/04/22  Yes [name], [name], MD  aspirin 81 MG tablet Take 81 mg by mouth daily.   Yes [provider]  atorvastatin (LIPITOR) 10 MG tablet TAKE 1 TABLET BY MOUTH EVERY DAY 08/11/22  Yes [name], [name], MD  esomeprazole (NEXIUM) 20 MG capsule Take 20 mg by mouth daily at 12 noon.   Yes [provider]  FeFum-FePoly-FA-B Cmp-C-Biot (FOLIVANE-PLUS) CAPS TAKE 1 CAPSULE BY MOUTH EVERY DAY IN THE MORNING 01/08/23  Yes Heilingoetter, Cassandra L, PA-C  JANUVIA 100 MG tablet TAKE 1 TABLET BY MOUTH EVERY DAY 02/06/23  Yes [name], [name], MD  metFORMIN (GLUCOPHAGE) 500 MG tablet Take 1 tablet (500 mg total) by mouth 1 day or 1 dose. 05/23/23  Yes [name], [name], MD  metoprolol succinate (TOPROL-XL) 25 MG 24 hr tablet TAKE 1/2 TABLET BY MOUTH EVERY DAY 02/04/23  Yes Pricilla Riffle, MD  OZEMPIC, 0.25 OR 0.5 MG/DOSE, 2 MG/3ML SOPN Inject 0.5 mg into the skin once a week. 09/27/22  Yes [provider]  triamterene-hydrochlorothiazide (MAXZIDE-25) 37.5-25 MG tablet TAKE 1 TABLET BY MOUTH EVERY DAY 11/10/22  Yes Pricilla Riffle, MD  methylPREDNISolone (MEDROL) 4 MG tablet Take as directed in tapering course 6-5-4-3-2-1 Patient not taking: Reported on 06/09/2023 03/08/23   Margaree Mackintosh, MD  nitroGLYCERIN (NITROSTAT) 0.4 MG SL tablet Place 1 tablet (0.4 mg total) under the tongue every 5 (five) minutes as needed for chest pain. 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   Drug use: No   Sexual activity: Yes    Partners: Female  Other Topics Concern   Not on file  Social History Narrative   Not on file   Social Determinants of Health   Financial Resource Strain: Low Risk  (08/23/2022)   Overall Financial Resource Strain (CARDIA)    Difficulty of Paying Living Expenses: Not hard at all  Food Insecurity: No Food Insecurity (08/23/2022)   Hunger Vital Sign    Worried About Running Out of Food in the Last Year: Never true    Ran Out of Food in the Last Year: Never true  Transportation Needs: No Transportation Needs (08/23/2022)   PRAPARE - Administrator, Civil Service (Medical): No  Lack of Transportation (Non-Medical): No  Physical Activity: Not on file  Stress: No Stress Concern Present (08/23/2022)   [employer] of Occupational Health - Occupational Stress Questionnaire    Feeling of Stress : Not at all  Social Connections: Socially Integrated (08/23/2022)   Social Connection and Isolation Panel [NHANES]    Frequency of Communication with Friends and Family: More than three times a week    Frequency of Social Gatherings with Friends and Family: Once a week    Attends Religious Services: More than 4 times per year    Active Member of [employer] or Organizations: Yes    Attends Engineer, structural: More than 4 times per year    Marital Status: Married   Family History  Problem Relation Age of Onset   Heart attack Father    Diabetes Mother    Diabetes Brother    Colon cancer Neg Hx    Stomach cancer Neg Hx    Esophageal cancer Neg Hx    Rectal cancer Neg Hx    Liver cancer Neg Hx     ROS: Currently denies lightheadedness, dizziness, Fever, chills, CP, SOB.   No personal history of  DVT, PE, MI, or CVA. No loose teeth or dentures All other systems have been reviewed and were otherwise currently negative with the exception of those mentioned in the HPI and as above.  Objective: Vitals: Ht: 5\' 9"  Wt: 269 lbs Temp: 97.7 BP: 124/73 Pulse: 77 O2 96% on room air.   Physical Exam: General: Alert, NAD.  Antalgic Gait  HEENT: EOMI, Good Neck Extension  Pulm: No increased work of breathing.  Clear B/L A/P w/o crackle or wheeze.  CV: RRR, No m/g/r appreciated  GI: soft, NT, ND Neuro: Neuro without gross focal deficit.  Sensation intact distally Skin: No lesions in the area of chief complaint MSK/Surgical Site: left knee w/o redness or effusion.  medial JLT. ROM 0-120.  5/5 strength in extension and flexion.  +EHL/FHL.  NVI.  Stable varus and valgus stress. Varus alignment   Imaging Review Plain radiographs demonstrate severe degenerative joint disease of the left knee.   The overall alignment is varus. The bone quality appears to be adequate for age and reported activity level.  Preoperative templating of the joint replacement has been completed, documented, and submitted to the Operating Room personnel in order to optimize intra-operative equipment management.  Assessment: left anteromedial knee DJD  Plan: Plan for Procedure(s): UNICOMPARTMENTAL KNEE  The patient history, physical exam, clinical judgement of the provider and imaging are consistent with end stage degenerative joint disease and unicompartmental joint arthroplasty is deemed medically necessary. The treatment options including medical management, injection therapy, and arthroplasty were discussed at length. The risks and benefits of Procedure(s): UNICOMPARTMENTAL KNEE were presented and reviewed.  The risks of nonoperative treatment, versus surgical intervention including but not limited to continued pain, aseptic loosening, stiffness, dislocation/subluxation, infection, bleeding, nerve injury, blood clots,  cardiopulmonary complications, morbidity, mortality, among others were discussed. The patient verbalizes understanding and wishes to proceed with the plan.  Patient is being admitted for inpatient treatment for surgery, pain control, PT, prophylactic antibiotics, VTE prophylaxis, progressive ambulation, ADL's and discharge planning.    The patient does meet the criteria for TXA which will be used perioperatively.   Xarelto or ELiquis will be used postoperatively for DVT prophylaxis in addition to SCDs, and early ambulation.   Armida Sans, PA-C 06/28/2023 6:59 AM

## 2023-06-28 NOTE — Anesthesia Procedure Notes (Signed)
Anesthesia Regional Block: Adductor canal block   Pre-Anesthetic Checklist: , timeout performed,  Correct Patient, Correct Site, Correct Laterality,  Correct Procedure,, site marked,  Risks and benefits discussed,  Surgical consent,  Pre-op evaluation,  At surgeon's request and post-op pain management  Laterality: Left  Prep: chloraprep       Needles:  Injection technique: Single-shot  Needle Type: Echogenic Stimulator Needle     Needle Length: 10cm  Needle Gauge: 20     Additional Needles:   Procedures:,,,, ultrasound used (permanent image in chart),,    Narrative:  Start time: 06/28/2023 7:00 AM End time: 06/28/2023 7:10 AM Injection made incrementally with aspirations every 5 mL.  Performed by: Personally  Anesthesiologist: Leonides Grills, MD  Additional Notes: Functioning IV was confirmed and monitors were applied. A time-out was performed. Hand hygiene and sterile gloves were used. The thigh was placed in a frog-leg position and prepped in a sterile fashion. A 20ga Bbraun echogenic stimulator needle was placed using ultrasound guidance.  Negative aspiration and negative test dose prior to incremental administration of local anesthetic. The patient tolerated the procedure well.

## 2023-06-28 NOTE — Anesthesia Postprocedure Evaluation (Signed)
Anesthesia Post Note  Patient: Leonard [name].  Procedure(s) Performed: LEFT UNICOMPARTMENTAL KNEE (Left: Knee)     Patient location during evaluation: PACU Anesthesia Type: Regional and Spinal Level of consciousness: awake Pain management: pain level controlled Vital Signs Assessment: post-procedure vital signs reviewed and stable Respiratory status: spontaneous breathing, nonlabored ventilation and respiratory function stable Cardiovascular status: blood pressure returned to baseline and stable Postop Assessment: no apparent nausea or vomiting Anesthetic complications: no   No notable events documented.  Last Vitals:  Vitals:   06/28/23 1130 06/28/23 1140  BP: 119/70   Pulse: 65 81  Resp: 12 13  Temp:    SpO2: 96% 92%    Last Pain:  Vitals:   06/28/23 1140  TempSrc:   PainSc: 0-No pain                  P 

## 2023-06-28 NOTE — Anesthesia Procedure Notes (Addendum)
Spinal  Patient location during procedure: OR Start time: 06/28/2023 7:37 AM End time: 06/28/2023 7:39 AM Reason for block: surgical anesthesia Staffing Performed: resident/CRNA  Anesthesiologist: Leonides Grills, MD Resident/CRNA: Doran Clay, CRNA Performed by: Doran Clay, CRNA Authorized by: Leonides Grills, MD   Preanesthetic Checklist Completed: patient identified, IV checked, site marked, risks and benefits discussed, surgical consent, monitors and equipment checked, pre-op evaluation and timeout performed Spinal Block Patient position: sitting Prep: DuraPrep Patient monitoring: heart rate, cardiac monitor, blood pressure and continuous pulse ox Approach: midline Location: L3-4 Needle Needle type: Pencan  Needle gauge: 24 G Needle length: 10 cm Needle insertion depth: 8 cm Assessment Sensory level: T6 Events: CSF return Additional Notes Timeout performed. Patient in sitting position.L3-4 identified. Cleansed with Duraprep. SAB without difficulty. To supine position.

## 2023-06-29 ENCOUNTER — Encounter (HOSPITAL_COMMUNITY): Payer: Self-pay | Admitting: Orthopedic Surgery

## 2023-06-30 DIAGNOSIS — M1712 Unilateral primary osteoarthritis, left knee: Secondary | ICD-10-CM | POA: Diagnosis not present

## 2023-07-02 ENCOUNTER — Other Ambulatory Visit: Payer: Self-pay | Admitting: Internal Medicine

## 2023-07-07 DIAGNOSIS — M1712 Unilateral primary osteoarthritis, left knee: Secondary | ICD-10-CM | POA: Diagnosis not present

## 2023-07-11 DIAGNOSIS — M1712 Unilateral primary osteoarthritis, left knee: Secondary | ICD-10-CM | POA: Diagnosis not present

## 2023-07-12 DIAGNOSIS — M1712 Unilateral primary osteoarthritis, left knee: Secondary | ICD-10-CM | POA: Diagnosis not present

## 2023-07-14 ENCOUNTER — Other Ambulatory Visit: Payer: Self-pay | Admitting: Physician Assistant

## 2023-07-14 DIAGNOSIS — D649 Anemia, unspecified: Secondary | ICD-10-CM

## 2023-07-19 DIAGNOSIS — M1712 Unilateral primary osteoarthritis, left knee: Secondary | ICD-10-CM | POA: Diagnosis not present

## 2023-07-21 DIAGNOSIS — M1712 Unilateral primary osteoarthritis, left knee: Secondary | ICD-10-CM | POA: Diagnosis not present

## 2023-07-26 DIAGNOSIS — M1712 Unilateral primary osteoarthritis, left knee: Secondary | ICD-10-CM | POA: Diagnosis not present

## 2023-07-28 DIAGNOSIS — M1712 Unilateral primary osteoarthritis, left knee: Secondary | ICD-10-CM | POA: Diagnosis not present

## 2023-07-30 NOTE — Progress Notes (Deleted)
[facility] OFFICE PROGRESS NOTE  [name], Leonard Cole, MD [address] [place] [address]  DIAGNOSIS:  1) monoclonal gammopathy of undetermined significance.  2) iron deficiency anemia.  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 Berneice Heinrich.   PRIOR THERAPY: Feraheme infusion x 2 doses, last dose was given in October of 2014.   CURRENT THERAPY: Folivane-Plus 1 capsule p.o. daily.   INTERVAL HISTORY: Leonard [name]. 77 y.o. male returns to clinic today for follow-up visit.  The patient was last seen 6 months ago by [name].  The patient is followed for his history of MGUS and iron deficiency anemia.  He is compliant with his full volume plus capsule daily.  He has not required any IV iron infusion since 2014.  In the interval since last being seen, the patient had orthopedic surgery on his ***left knee?  The patient denies any recent fever, chills, night sweats, or unexplained weight loss.  Denies any lymphadenopathy.  Denies any unusual signs and symptoms of infection.  Denies any abnormal bleeding or bruising.  Denies any nausea, vomiting, diarrhea, constipation, abdominal fullness, or bloating.  He is here today for repeat myeloma panel, and iron studies. 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 09/05/2006   EGD   CKD (chronic kidney disease)    Coronary artery disease    Diverticulosis of colon (without mention of hemorrhage) 09/05/2006   Colonoscopy    Gastric polyp 2014   done at [facility]   GERD (gastroesophageal reflux disease) 07/26/2001   EGD(Chronic)   Gouty arthropathy    Hemorrhoids    History of hiatal hernia    History of kidney stones    Hx of colonic polyps 09/05/2006   Colonoscopy(ADENOMATOUS POLYP)   Iron deficiency anemia,  unspecified    Monoclonal gammopathy    OSA (obstructive sleep apnea)    USES CPAP MACHINE    Other and unspecified hyperlipidemia    Prostate cancer (HCC) 2015   Type II or unspecified type diabetes mellitus without mention of complication, not stated as uncontrolled    Unspecified essential hypertension     ALLERGIES:  is allergic to dicloxacillin, cayenne pepper [cayenne], and nsaids.  MEDICATIONS:  Current Outpatient Medications  Medication Sig Dispense Refill   allopurinol (ZYLOPRIM) 300 MG tablet TAKE 1 TABLET BY MOUTH DAILY TO PREVENT GOUT 90 tablet 3   amLODipine-benazepril (LOTREL) 10-20 MG capsule TAKE 1 CAPSULE BY MOUTH EVERY DAY 90 capsule 3   apixaban (ELIQUIS) 2.5 MG TABS tablet Take 1 tablet (2.5 mg total) by mouth 2 (two) times daily. 60 tablet 0   aspirin 81 MG tablet Take 81 mg by mouth daily.     atorvastatin (LIPITOR) 10 MG tablet TAKE 1 TABLET BY MOUTH EVERY DAY 90 tablet 3   baclofen (LIORESAL) 10 MG tablet Take 1 tablet (10 mg total) by mouth 3 (three) times daily. As needed for muscle spasm 50 tablet 0   esomeprazole (NEXIUM) 20 MG capsule Take 20 mg by mouth daily at 12 noon.     FeFum-FePoly-FA-B Cmp-C-Biot (FOLIVANE-PLUS) CAPS TAKE 1 CAPSULE BY MOUTH EVERY DAY IN THE MORNING 90 capsule 1   JANUVIA 100 MG tablet TAKE 1 TABLET BY MOUTH EVERY DAY 90 tablet 1   metFORMIN (GLUCOPHAGE) 500 MG tablet Take 1 tablet (500 mg  total) by mouth 1 day or 1 dose. 90 tablet 3   methylPREDNISolone (MEDROL) 4 MG tablet Take as directed in tapering course 6-5-4-3-2-1 (Patient not taking: Reported on 06/09/2023) 21 tablet 0   metoprolol succinate (TOPROL-XL) 25 MG 24 hr tablet TAKE 1/2 TABLET BY MOUTH EVERY DAY 45 tablet 2   nitroGLYCERIN (NITROSTAT) 0.4 MG SL tablet Place 1 tablet (0.4 mg total) under the tongue every 5 (five) minutes as needed for chest pain. Reported on 03/04/2016 30 tablet 5   ondansetron (ZOFRAN) 4 MG tablet Take 1 tablet (4 mg total) by mouth every 8 (eight)  hours as needed for nausea or vomiting. 10 tablet 0   oxyCODONE (ROXICODONE) 5 MG immediate release tablet Take 1 tablet (5 mg total) by mouth every 4 (four) hours as needed for severe pain. 30 tablet 0   OZEMPIC, 0.25 OR 0.5 MG/DOSE, 2 MG/3ML SOPN Inject 0.5 mg into the skin once a week.     sennosides-docusate sodium (SENOKOT-S) 8.6-50 MG tablet Take 2 tablets by mouth daily. 30 tablet 1   triamterene-hydrochlorothiazide (MAXZIDE-25) 37.5-25 MG tablet TAKE 1 TABLET BY MOUTH EVERY DAY 90 tablet 3   No current facility-administered medications for this visit.    SURGICAL HISTORY:  Past Surgical History:  Procedure Laterality Date   COLONOSCOPY     ESOPHAGOGASTRODUODENOSCOPY     Done at [facility]   ESOPHAGOGASTRODUODENOSCOPY (EGD) WITH PROPOFOL N/A 11/04/2017   Procedure: ESOPHAGOGASTRODUODENOSCOPY (EGD) WITH PROPOFOL;  Surgeon: Beverley Fiedler, MD;  Location: [facility];  Service: Gastroenterology;  Laterality: N/A;   GI PROSTATE BIOPSY     HEMORRHOID SURGERY     INCISIONAL HERNIA REPAIR N/A 08/07/2015   Procedure: LAPAROSCOPIC INCISIONAL HERNIA repair with mesh;  Surgeon: De Blanch Kinsinger, MD;  Location: [facility];  Service: General;  Laterality: N/A;   KNEE SURGERY  1999   TUMOR REMOVED LEFT KNEE & ARTHROSCOPIC SURGERY   LYMPHADENECTOMY Bilateral 05/01/2014   Procedure: LYMPHADENECTOMY;  Surgeon: Sebastian Ache, MD;  Location: [facility];  Service: Urology;  Laterality: Bilateral;   MASS EXCISION Right 08/28/2020   Procedure: EXCISION RIGHT SHOULDER MASS;  Surgeon: Almond Lint, MD;  Location: [facility] OR;  Service: General;  Laterality: Right;   PARTIAL KNEE ARTHROPLASTY Left 06/28/2023   Procedure: LEFT UNICOMPARTMENTAL KNEE;  Surgeon: Teryl Lucy, MD;  Location: [facility];  Service: Orthopedics;  Laterality: Left;   ROBOT ASSISTED LAPAROSCOPIC RADICAL PROSTATECTOMY N/A 05/01/2014   Procedure: ROBOTIC ASSISTED LAPAROSCOPIC RADICAL PROSTATECTOMY WITH INDOCYANINE GREEN DYE;  Surgeon: Sebastian Ache,  MD;  Location: [facility];  Service: Urology;  Laterality: N/A;   UPPER GASTROINTESTINAL ENDOSCOPY      REVIEW OF SYSTEMS:   Review of Systems  Constitutional: Negative for appetite change, chills, fatigue, fever and unexpected weight change.  HENT:   Negative for mouth sores, nosebleeds, sore throat and trouble swallowing.   Eyes: Negative for eye problems and icterus.  Respiratory: Negative for cough, hemoptysis, shortness of breath and wheezing.   Cardiovascular: Negative for chest pain and leg swelling.  Gastrointestinal: Negative for abdominal pain, constipation, diarrhea, nausea and vomiting.  Genitourinary: Negative for bladder incontinence, difficulty urinating, dysuria, frequency and hematuria.   Musculoskeletal: Negative for back pain, gait problem, neck pain and neck stiffness.  Skin: Negative for itching and rash.  Neurological: Negative for dizziness, extremity weakness, gait problem, headaches, light-headedness and seizures.  Hematological: Negative for adenopathy. Does not bruise/bleed easily.  Psychiatric/Behavioral: Negative for confusion, depression and sleep disturbance. The patient is not nervous/anxious.  PHYSICAL EXAMINATION:  There were no vitals taken for this visit.  ECOG PERFORMANCE STATUS: {CHL ONC ECOG [id number]  Physical Exam  Constitutional: Oriented to person, place, and time and well-developed, well-nourished, and in no distress. No distress.  HENT:  Head: Normocephalic and atraumatic.  Mouth/Throat: Oropharynx is clear and moist. No oropharyngeal exudate.  Eyes: Conjunctivae are normal. Right eye exhibits no discharge. Left eye exhibits no discharge. No scleral icterus.  Neck: Normal range of motion. Neck supple.  Cardiovascular: Normal rate, regular rhythm, normal heart sounds and intact distal pulses.   Pulmonary/Chest: Effort normal and breath sounds normal. No respiratory distress. No wheezes. No rales.  Abdominal: Soft. Bowel sounds are  normal. Exhibits no distension and no mass. There is no tenderness.  Musculoskeletal: Normal range of motion. Exhibits no edema.  Lymphadenopathy:    No cervical adenopathy.  Neurological: Alert and oriented to person, place, and time. Exhibits normal muscle tone. Gait normal. Coordination normal.  Skin: Skin is warm and dry. No rash noted. Not diaphoretic. No erythema. No pallor.  Psychiatric: Mood, memory and judgment normal.  Vitals reviewed.  LABORATORY DATA: Lab Results  Component Value Date   WBC 11.9 (H) 06/15/2023   HGB 11.5 (L) 06/15/2023   HCT 35.8 (L) 06/15/2023   MCV 97.0 06/15/2023   PLT 267 06/15/2023      Chemistry      Component Value Date/Time   NA 137 06/15/2023 1339   NA 137 06/24/2021 1227   NA 140 09/14/2017 0928   K 3.8 06/15/2023 1339   K 4.0 09/14/2017 0928   CL 104 06/15/2023 1339   CL 104 04/17/2013 0857   CO2 21 (L) 06/15/2023 1339   CO2 27 09/14/2017 0928   BUN 32 (H) 06/15/2023 1339   BUN 30 (H) 06/24/2021 1227   BUN 28.3 (H) 09/14/2017 0928   CREATININE 1.41 (H) 06/15/2023 1339   CREATININE 1.18 01/25/2023 1001   CREATININE 1.56 (H) 08/19/2022 1120   CREATININE 1.4 (H) 09/14/2017 0928      Component Value Date/Time   CALCIUM 9.3 06/15/2023 1339   CALCIUM 10.1 09/14/2017 0928   ALKPHOS 71 01/25/2023 1001   ALKPHOS 80 09/14/2017 0928   AST 15 02/24/2023 0914   AST 14 (L) 01/25/2023 1001   AST 17 09/14/2017 0928   ALT 16 02/24/2023 0914   ALT 17 01/25/2023 1001   ALT 18 09/14/2017 0928   BILITOT 0.5 02/24/2023 0914   BILITOT 0.5 01/25/2023 1001   BILITOT 0.45 09/14/2017 0928       RADIOGRAPHIC STUDIES:  No results found.   ASSESSMENT/PLAN:  This is a very pleasant 77 year old African-American male with a history of iron deficiency anemia secondary to GI blood losses and MGUS.  He is currently on observation and feeling fine.  The patient had a repeat myeloma panel and CBC and iron studies.  His labs show***  [name]  personally and independently reviewed his myeloma panel and recommends that he continue on observation with repeat blood work in 6 months.  He will continue taking his oral iron supplement for now.  I will arrange for his repeat blood work to take place approximately 1 week prior to his next follow-up visit so we may review the results at his next appointment  The patient was advised to call immediately if she has any concerning symptoms in the interval. The patient voices understanding of current disease status and treatment options and is in agreement with the current care plan. All  questions were answered. The patient knows to call the clinic with any problems, questions or concerns. We can certainly see the patient much sooner if necessary   No orders of the defined types were placed in this encounter.    I spent {CHL ONC TIME VISIT - ZOXWR:[id number]} counseling the patient face to face. The total time spent in the appointment was {CHL ONC TIME VISIT - BJYNW:[id number]}.  Nuri Larmer L Benigna Delisi, PA-C 07/30/23

## 2023-08-02 DIAGNOSIS — M1712 Unilateral primary osteoarthritis, left knee: Secondary | ICD-10-CM | POA: Diagnosis not present

## 2023-08-04 ENCOUNTER — Ambulatory Visit: Payer: Medicare Other | Admitting: Internal Medicine

## 2023-08-04 ENCOUNTER — Other Ambulatory Visit: Payer: Self-pay | Admitting: Physician Assistant

## 2023-08-04 ENCOUNTER — Inpatient Hospital Stay: Payer: Medicare Other | Attending: Physician Assistant

## 2023-08-04 ENCOUNTER — Inpatient Hospital Stay: Payer: Medicare Other | Admitting: Physician Assistant

## 2023-08-04 ENCOUNTER — Other Ambulatory Visit: Payer: Medicare Other

## 2023-08-04 DIAGNOSIS — D472 Monoclonal gammopathy: Secondary | ICD-10-CM

## 2023-08-04 DIAGNOSIS — D508 Other iron deficiency anemias: Secondary | ICD-10-CM | POA: Insufficient documentation

## 2023-08-04 DIAGNOSIS — M1712 Unilateral primary osteoarthritis, left knee: Secondary | ICD-10-CM | POA: Diagnosis not present

## 2023-08-05 ENCOUNTER — Ambulatory Visit (HOSPITAL_BASED_OUTPATIENT_CLINIC_OR_DEPARTMENT_OTHER): Payer: Medicare Other | Admitting: Pulmonary Disease

## 2023-08-08 ENCOUNTER — Telehealth: Payer: Self-pay | Admitting: Internal Medicine

## 2023-08-08 DIAGNOSIS — M1712 Unilateral primary osteoarthritis, left knee: Secondary | ICD-10-CM | POA: Diagnosis not present

## 2023-08-08 NOTE — Progress Notes (Signed)
[name] OFFICE PROGRESS NOTE  [name], Leonard Cole, MD [address] [place] [address]  DIAGNOSIS: 1) monoclonal gammopathy of undetermined significance.  2) iron deficiency anemia.  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 Berneice Heinrich.   PRIOR THERAPY: Feraheme infusion x 2 doses, last dose was given in October of 2014.   CURRENT THERAPY:  1) Folivane-Plus 1 capsule p.o. daily.  2) Ferrous sulfate daily  TID started this past week  INTERVAL HISTORY: Leonard [name]. 77 y.o. male returns to the clinic today for a follow-up visit.  The patient was last seen 6 months ago by [name].  The patient is followed for his history of MGUS and iron deficiency anemia.  He is compliant with his Folivane plus capsule daily.  His PCP called him earlier this week to start taking ferrous sulfate TID. He started this a few days ago. He has not required any IV iron infusion since 2014. In the interval since last being seen, the patient had orthopedic surgery on his left knee. He had a lost weight so he could have surgery. He is covering well. He is going to therapy and becoming more mobile. He denies any usual bone pain. The patient denies any recent fever, chills, or night sweats.   Denies any lymphadenopathy.  Denies any unusual signs and symptoms of infection.  Denies any abnormal bleeding or bruising.  Denies any nausea, vomiting, diarrhea, constipation, abdominal fullness, or bloating.  He is here today for repeat myeloma panel, and iron studies.  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 09/05/2006   EGD   CKD (chronic kidney disease)    Coronary artery disease    Diverticulosis of colon (without mention of hemorrhage) 09/05/2006   Colonoscopy    Gastric  polyp 2014   done at [facility]   GERD (gastroesophageal reflux disease) 07/26/2001   EGD(Chronic)   Gouty arthropathy    Hemorrhoids    History of hiatal hernia    History of kidney stones    Hx of colonic polyps 09/05/2006   Colonoscopy(ADENOMATOUS POLYP)   Iron deficiency anemia, unspecified    Monoclonal gammopathy    OSA (obstructive sleep apnea)    USES CPAP MACHINE    Other and unspecified hyperlipidemia    Prostate cancer (HCC) 2015   Type II or unspecified type diabetes mellitus without mention of complication, not stated as uncontrolled    Unspecified essential hypertension     ALLERGIES:  is allergic to dicloxacillin, cayenne pepper [cayenne], and nsaids.  MEDICATIONS:  Current Outpatient Medications  Medication Sig Dispense Refill   allopurinol (ZYLOPRIM) 300 MG tablet TAKE 1 TABLET BY MOUTH DAILY TO PREVENT GOUT 90 tablet 3   amLODipine-benazepril (LOTREL) 10-20 MG capsule TAKE 1 CAPSULE BY MOUTH EVERY DAY 90 capsule 3   apixaban (ELIQUIS) 2.5 MG TABS tablet Take 1 tablet (2.5 mg total) by mouth 2 (two) times daily. 60 tablet 0   aspirin 81 MG tablet Take 81 mg by mouth daily.     atorvastatin (LIPITOR) 10 MG tablet TAKE 1 TABLET BY MOUTH EVERY DAY 90 tablet 3   baclofen (LIORESAL) 10 MG tablet Take 1 tablet (10 mg total) by mouth 3 (three) times daily. As needed for muscle spasm 50 tablet 0   esomeprazole (NEXIUM) 20 MG capsule  Take 20 mg by mouth daily at 12 noon.     FeFum-FePoly-FA-B Cmp-C-Biot (FOLIVANE-PLUS) CAPS TAKE 1 CAPSULE BY MOUTH EVERY DAY IN THE MORNING 90 capsule 1   JANUVIA 100 MG tablet TAKE 1 TABLET BY MOUTH EVERY DAY 90 tablet 1   metFORMIN (GLUCOPHAGE) 500 MG tablet Take 1 tablet (500 mg total) by mouth 1 day or 1 dose. 90 tablet 3   methylPREDNISolone (MEDROL) 4 MG tablet Take as directed in tapering course 6-5-4-3-2-1 21 tablet 0   metoprolol succinate (TOPROL-XL) 25 MG 24 hr tablet TAKE 1/2 TABLET BY MOUTH EVERY DAY 45 tablet 2   nitroGLYCERIN  (NITROSTAT) 0.4 MG SL tablet Place 1 tablet (0.4 mg total) under the tongue every 5 (five) minutes as needed for chest pain. Reported on 03/04/2016 30 tablet 5   ondansetron (ZOFRAN) 4 MG tablet Take 1 tablet (4 mg total) by mouth every 8 (eight) hours as needed for nausea or vomiting. 10 tablet 0   oxyCODONE (ROXICODONE) 5 MG immediate release tablet Take 1 tablet (5 mg total) by mouth every 4 (four) hours as needed for severe pain. 30 tablet 0   OZEMPIC, 0.25 OR 0.5 MG/DOSE, 2 MG/3ML SOPN Inject 0.5 mg into the skin once a week.     sennosides-docusate sodium (SENOKOT-S) 8.6-50 MG tablet Take 2 tablets by mouth daily. 30 tablet 1   triamterene-hydrochlorothiazide (MAXZIDE-25) 37.5-25 MG tablet TAKE 1 TABLET BY MOUTH EVERY DAY 90 tablet 3   No current facility-administered medications for this visit.    SURGICAL HISTORY:  Past Surgical History:  Procedure Laterality Date   COLONOSCOPY     ESOPHAGOGASTRODUODENOSCOPY     Done at [facility]   ESOPHAGOGASTRODUODENOSCOPY (EGD) WITH PROPOFOL N/A 11/04/2017   Procedure: ESOPHAGOGASTRODUODENOSCOPY (EGD) WITH PROPOFOL;  Surgeon: Beverley Fiedler, MD;  Location: [facility];  Service: Gastroenterology;  Laterality: N/A;   GI PROSTATE BIOPSY     HEMORRHOID SURGERY     INCISIONAL HERNIA REPAIR N/A 08/07/2015   Procedure: LAPAROSCOPIC INCISIONAL HERNIA repair with mesh;  Surgeon: De Blanch Kinsinger, MD;  Location: [facility];  Service: General;  Laterality: N/A;   KNEE SURGERY  1999   TUMOR REMOVED LEFT KNEE & ARTHROSCOPIC SURGERY   LYMPHADENECTOMY Bilateral 05/01/2014   Procedure: LYMPHADENECTOMY;  Surgeon: Sebastian Ache, MD;  Location: [facility];  Service: Urology;  Laterality: Bilateral;   MASS EXCISION Right 08/28/2020   Procedure: EXCISION RIGHT SHOULDER MASS;  Surgeon: Almond Lint, MD;  Location: [facility];  Service: General;  Laterality: Right;   PARTIAL KNEE ARTHROPLASTY Left 06/28/2023   Procedure: LEFT UNICOMPARTMENTAL KNEE;  Surgeon: Teryl Lucy, MD;   Location: [facility];  Service: Orthopedics;  Laterality: Left;   ROBOT ASSISTED LAPAROSCOPIC RADICAL PROSTATECTOMY N/A 05/01/2014   Procedure: ROBOTIC ASSISTED LAPAROSCOPIC RADICAL PROSTATECTOMY WITH INDOCYANINE GREEN DYE;  Surgeon: Sebastian Ache, MD;  Location: [facility];  Service: Urology;  Laterality: N/A;   UPPER GASTROINTESTINAL ENDOSCOPY      REVIEW OF SYSTEMS:   Review of Systems  Constitutional: Negative for appetite change, chills, fatigue, fever and unexpected weight change.  HENT:   Negative for mouth sores, nosebleeds, sore throat and trouble swallowing.   Eyes: Negative for eye problems and icterus.  Respiratory: Negative for cough, hemoptysis, shortness of breath and wheezing.   Cardiovascular: Negative for chest pain and leg swelling.  Gastrointestinal: Negative for abdominal pain, constipation, diarrhea, nausea and vomiting.  Genitourinary: Negative for bladder incontinence, difficulty urinating, dysuria, frequency and hematuria.   Musculoskeletal: Negative for back  pain, gait problem, neck pain and neck stiffness.  Skin: Negative for itching and rash.  Neurological: Negative for dizziness, extremity weakness, gait problem, headaches, light-headedness and seizures.  Hematological: Negative for adenopathy. Does not bruise/bleed easily.  Psychiatric/Behavioral: Negative for confusion, depression and sleep disturbance. The patient is not nervous/anxious.     PHYSICAL EXAMINATION:  Blood pressure 116/60, pulse 82, temperature 98.3 F (36.8 C), temperature source Oral, resp. rate 16, weight 263 lb 6.4 oz (119.5 kg), SpO2 97%.  ECOG PERFORMANCE STATUS: 1  Physical Exam  Constitutional: Oriented to person, place, and time and well-developed, well-nourished, and in no distress.  HENT:  Head: Normocephalic and atraumatic.  Mouth/Throat: Oropharynx is clear and moist. No oropharyngeal exudate.  Eyes: Conjunctivae are normal. Right eye exhibits no discharge. Left eye exhibits no  discharge. No scleral icterus.  Neck: Normal range of motion. Neck supple.  Cardiovascular: Normal rate, regular rhythm, normal heart sounds and intact distal pulses.   Pulmonary/Chest: Effort normal and breath sounds normal. No respiratory distress. No wheezes. No rales.  Abdominal: Soft. Bowel sounds are normal. Exhibits no distension and no mass. There is no tenderness.  Musculoskeletal: Normal range of motion. Exhibits no edema.  Lymphadenopathy:    No cervical adenopathy.  Neurological: Alert and oriented to person, place, and time. Exhibits normal muscle tone. Gait normal. Coordination normal.  Skin: Skin is warm and dry. No rash noted. Not diaphoretic. No erythema. No pallor.  Psychiatric: Mood, memory and judgment normal.  Vitals reviewed.  LABORATORY DATA: Lab Results  Component Value Date   WBC 8.2 08/11/2023   HGB 11.5 (L) 08/11/2023   HCT 35.1 (L) 08/11/2023   MCV 94.6 08/11/2023   PLT 236 08/11/2023      Chemistry      Component Value Date/Time   NA 137 06/15/2023 1339   NA 137 06/24/2021 1227   NA 140 09/14/2017 0928   K 3.8 06/15/2023 1339   K 4.0 09/14/2017 0928   CL 104 06/15/2023 1339   CL 104 04/17/2013 0857   CO2 21 (L) 06/15/2023 1339   CO2 27 09/14/2017 0928   BUN 32 (H) 06/15/2023 1339   BUN 30 (H) 06/24/2021 1227   BUN 28.3 (H) 09/14/2017 0928   CREATININE 1.41 (H) 06/15/2023 1339   CREATININE 1.18 01/25/2023 1001   CREATININE 1.56 (H) 08/19/2022 1120   CREATININE 1.4 (H) 09/14/2017 0928      Component Value Date/Time   CALCIUM 9.3 06/15/2023 1339   CALCIUM 10.1 09/14/2017 0928   ALKPHOS 71 01/25/2023 1001   ALKPHOS 80 09/14/2017 0928   AST 15 02/24/2023 0914   AST 14 (L) 01/25/2023 1001   AST 17 09/14/2017 0928   ALT 16 02/24/2023 0914   ALT 17 01/25/2023 1001   ALT 18 09/14/2017 0928   BILITOT 0.5 02/24/2023 0914   BILITOT 0.5 01/25/2023 1001   BILITOT 0.45 09/14/2017 0928       RADIOGRAPHIC STUDIES:  No results  found.   ASSESSMENT/PLAN:  This is a very pleasant 77 year old African-American male with a history of iron deficiency anemia secondary to GI blood losses and MGUS.  He is currently on observation and feeling fine.  The patient had a repeat myeloma panel and CBC and iron studies.  He was seen with [name]. His labs show some increase in his protein studies, but [name] recommends close monitoring for now.   [name] personally and independently reviewed his myeloma panel and recommends that he continue on observation  with repeat blood work in 6 months.  He will continue taking his oral iron supplement for now.  I will arrange for his repeat blood work to take place approximately 1 week prior to his next follow-up visit so we may review the results at his next appointment  The patient was advised to call immediately if she has any concerning symptoms in the interval. The patient voices understanding of current disease status and treatment options and is in agreement with the current care plan. All questions were answered. The patient knows to call the clinic with any problems, questions or concerns. We can certainly see the patient much sooner if necessary   Orders Placed This Encounter  Procedures   Ferritin    Standing Status:   Future    Standing Expiration Date:   08/17/2024   Iron and Iron Binding Capacity ([facility] only)    Standing Status:   Future    Standing Expiration Date:   08/17/2024   CBC with Differential (Cancer Center Only)    Standing Status:   Future    Standing Expiration Date:   08/17/2024   Beta 2 microglobulin    Standing Status:   Future    Standing Expiration Date:   08/17/2024   QIG  (Quant. immunoglobulins  - IgG, IgA, IgM)    Standing Status:   Future    Standing Expiration Date:   08/17/2024   Kappa/lambda light chains    Standing Status:   Future    Standing Expiration Date:   08/17/2024   Lactate dehydrogenase (LDH)    Standing Status:    Future    Standing Expiration Date:   08/17/2024      Johnette Abraham Emory Gallentine, PA-C 08/18/23  ADDENDUM: Hematology/Oncology Attending: I had a face to face encounter with the patient today.  I reviewed his record, lab and recommended his care plan.  This is a very pleasant 77 years old African-American male with history of monoclonal gammopathy of undetermined significance as well as iron deficiency anemia.  The patient also has a history of prostate adenocarcinoma.  He is status post treatment with Feraheme infusion many years ago and has been on treatment with ferrous sulfate in the past.  He had repeat myeloma panel as well as iron study and ferritin performed last week.  His myeloma panel showed no concerning finding for disease progression except for increase in the free kappa light chain as well as free lambda light chain but the kappa lambda light chain ratio is only 2.46.  Quantitative immunoglobulin showed slight increase in the IgM.  His iron study showed low serum iron of 27 with iron saturation of 9%.  This result was seen by his primary care physician who recommended for the patient to start taking ferrous sulfate 3 times daily.  The patient is feeling fine with no concerning complaints. I recommended for him to continue on the oral iron tablet for now with vitamin C or orange juice. He will come back for follow-up visit in 6 months for evaluation with repeat myeloma panel as well as iron study and ferritin.  I do not see any concern about progressive multiple myeloma at this point but we will continue to monitor his myeloma panel closely. He was advised to call immediately if he has any other concerning symptoms in the interval. The total time spent in the appointment was 25 minutes. Disclaimer: This note was dictated with voice recognition software. Similar sounding words can inadvertently be transcribed and  may be missed upon review. Lajuana Matte, MD

## 2023-08-08 NOTE — Telephone Encounter (Signed)
Called patient regarding September/October appointments, patient is notified.

## 2023-08-09 DIAGNOSIS — M1712 Unilateral primary osteoarthritis, left knee: Secondary | ICD-10-CM | POA: Diagnosis not present

## 2023-08-11 ENCOUNTER — Inpatient Hospital Stay: Payer: Medicare Other

## 2023-08-11 DIAGNOSIS — D508 Other iron deficiency anemias: Secondary | ICD-10-CM | POA: Diagnosis not present

## 2023-08-11 DIAGNOSIS — D472 Monoclonal gammopathy: Secondary | ICD-10-CM | POA: Diagnosis not present

## 2023-08-11 LAB — FERRITIN: Ferritin: 153 ng/mL (ref 24–336)

## 2023-08-11 LAB — CBC WITH DIFFERENTIAL (CANCER CENTER ONLY)
Abs Immature Granulocytes: 0.02 10*3/uL (ref 0.00–0.07)
Basophils Absolute: 0 10*3/uL (ref 0.0–0.1)
Basophils Relative: 0 %
Eosinophils Absolute: 0.1 10*3/uL (ref 0.0–0.5)
Eosinophils Relative: 1 %
HCT: 35.1 % — ABNORMAL LOW (ref 39.0–52.0)
Hemoglobin: 11.5 g/dL — ABNORMAL LOW (ref 13.0–17.0)
Immature Granulocytes: 0 %
Lymphocytes Relative: 38 %
Lymphs Abs: 3.1 10*3/uL (ref 0.7–4.0)
MCH: 31 pg (ref 26.0–34.0)
MCHC: 32.8 g/dL (ref 30.0–36.0)
MCV: 94.6 fL (ref 80.0–100.0)
Monocytes Absolute: 0.8 10*3/uL (ref 0.1–1.0)
Monocytes Relative: 10 %
Neutro Abs: 4.1 10*3/uL (ref 1.7–7.7)
Neutrophils Relative %: 51 %
Platelet Count: 236 10*3/uL (ref 150–400)
RBC: 3.71 MIL/uL — ABNORMAL LOW (ref 4.22–5.81)
RDW: 14.6 % (ref 11.5–15.5)
WBC Count: 8.2 10*3/uL (ref 4.0–10.5)
nRBC: 0 % (ref 0.0–0.2)

## 2023-08-11 LAB — IRON AND IRON BINDING CAPACITY (CC-WL,HP ONLY)
Iron: 27 ug/dL — ABNORMAL LOW (ref 45–182)
Saturation Ratios: 9 % — ABNORMAL LOW (ref 17.9–39.5)
TIBC: 316 ug/dL (ref 250–450)
UIBC: 289 ug/dL (ref 117–376)

## 2023-08-12 LAB — BETA 2 MICROGLOBULIN, SERUM: Beta-2 Microglobulin: 3.7 mg/L — ABNORMAL HIGH (ref 0.6–2.4)

## 2023-08-13 LAB — IGG, IGA, IGM
IgA: 295 mg/dL (ref 61–437)
IgG (Immunoglobin G), Serum: 1081 mg/dL (ref 603–1613)
IgM (Immunoglobulin M), Srm: 187 mg/dL — ABNORMAL HIGH (ref 15–143)

## 2023-08-16 DIAGNOSIS — M1712 Unilateral primary osteoarthritis, left knee: Secondary | ICD-10-CM | POA: Diagnosis not present

## 2023-08-16 LAB — KAPPA/LAMBDA LIGHT CHAINS
Kappa free light chain: 94.9 mg/L — ABNORMAL HIGH (ref 3.3–19.4)
Kappa, lambda light chain ratio: 2.46 — ABNORMAL HIGH (ref 0.26–1.65)
Lambda free light chains: 38.6 mg/L — ABNORMAL HIGH (ref 5.7–26.3)

## 2023-08-18 ENCOUNTER — Inpatient Hospital Stay: Payer: Medicare Other | Attending: Physician Assistant | Admitting: Physician Assistant

## 2023-08-18 ENCOUNTER — Encounter: Payer: Self-pay | Admitting: Physician Assistant

## 2023-08-18 VITALS — BP 116/60 | HR 82 | Temp 98.3°F | Resp 16 | Wt 263.4 lb

## 2023-08-18 DIAGNOSIS — D509 Iron deficiency anemia, unspecified: Secondary | ICD-10-CM | POA: Diagnosis not present

## 2023-08-18 DIAGNOSIS — D508 Other iron deficiency anemias: Secondary | ICD-10-CM

## 2023-08-18 DIAGNOSIS — Z8546 Personal history of malignant neoplasm of prostate: Secondary | ICD-10-CM | POA: Insufficient documentation

## 2023-08-18 DIAGNOSIS — D472 Monoclonal gammopathy: Secondary | ICD-10-CM | POA: Insufficient documentation

## 2023-08-25 DIAGNOSIS — M1712 Unilateral primary osteoarthritis, left knee: Secondary | ICD-10-CM | POA: Diagnosis not present

## 2023-09-05 DIAGNOSIS — M1712 Unilateral primary osteoarthritis, left knee: Secondary | ICD-10-CM | POA: Diagnosis not present

## 2023-09-06 ENCOUNTER — Other Ambulatory Visit: Payer: Medicare Other

## 2023-09-07 ENCOUNTER — Ambulatory Visit: Payer: Medicare Other | Admitting: Primary Care

## 2023-09-08 NOTE — Progress Notes (Signed)
Patient Care Team: Margaree Mackintosh, MD as PCP - General (Internal Medicine) Pricilla Riffle, MD as PCP - Cardiology (Cardiology) Su Grand, MD (Inactive) (Urology)  Visit Date: 09/15/23  Subjective:    Patient ID: Leonard [name] , Male   DOB: Dec 07, 1945, 77 y.o.    MRN: [id number]   77 y.o. Male presents today for iron deficiency anemia follow-up. History of monoclonal gammopathy of undetermined significance, iron deficiency anemia, prostate adenocarcinoma. Iron low at 35 on 09/13/23, up from 27 on 08/11/23. Hemoglobin low at 10.8 on 09/13/23, down from 11.5 on 08/11/23. He is on Folivane-Plus 1 capsule p.o. daily, ferrous sulfate TID. Reports missing ferrous sulfate sometimes but is consistently taking Folivane-Plus. Has received Feraheme infusions in the past. Followed by Dr. Si Gaul at [facility]. Denies black tarry stools, blood in stool.  Status post left unicompartmental knee arthroplasty 06/28/23. He is doing home PT. Reports left knee mobility is improving slowly. He is walking regularly.  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 09/05/2006   EGD   CKD (chronic kidney disease)    Coronary artery disease    Diverticulosis of colon (without mention of hemorrhage) 09/05/2006   Colonoscopy    Gastric polyp 2014   done at [facility]   GERD (gastroesophageal reflux disease) 07/26/2001   EGD(Chronic)   Gouty arthropathy    Hemorrhoids    History of hiatal hernia    History of kidney stones    Hx of colonic polyps 09/05/2006   Colonoscopy(ADENOMATOUS POLYP)   Iron deficiency anemia, unspecified    Monoclonal gammopathy    OSA (obstructive sleep apnea)    USES CPAP MACHINE    Other and unspecified hyperlipidemia    Prostate cancer (HCC) 2015   Type II or unspecified type diabetes mellitus without mention of complication, not stated as uncontrolled    Unspecified essential hypertension      Family History  Problem  Relation Age of Onset   Heart attack Father    Diabetes Mother    Diabetes Brother    Colon cancer Neg Hx    Stomach cancer Neg Hx    Esophageal cancer Neg Hx    Rectal cancer Neg Hx    Liver cancer Neg Hx     Social Hx: Married. 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  Does not smoke or consume alcohol.  Family history: Father died of an MI.  Mother died of an MI.  2 brothers with history of diabetes.  1 sister.  Mother had diabetes in both parents and hypertension.   Review of Systems  Constitutional:  Negative for fever and malaise/fatigue.  HENT:  Negative for congestion.   Eyes:  Negative for blurred vision.  Respiratory:  Negative for cough and shortness of breath.   Cardiovascular:  Negative for chest pain, palpitations and leg swelling.  Gastrointestinal:  Negative for blood in stool, melena and vomiting.  Musculoskeletal:  Negative for back pain.  Skin:  Negative for rash.  Neurological:  Negative for loss of consciousness and headaches.        Objective:   Vitals: BP 110/70   Ht 5\' 9"  (1.753 m)   Wt 269 lb (122 kg)   BMI 39.72 kg/m    Physical Exam Constitutional:      General: He is not in acute distress.    Appearance: Normal appearance. He is  not ill-appearing.  HENT:     Head: Normocephalic and atraumatic.  Cardiovascular:     Rate and Rhythm: Normal rate and regular rhythm.     Pulses: Normal pulses.     Heart sounds: Normal heart sounds. No murmur heard.    No friction rub. No gallop.     Comments: Occasional irregular contraction. Trace pitting edema bilateral lower extremities. Pulmonary:     Effort: Pulmonary effort is normal. No respiratory distress.     Breath sounds: Normal breath sounds. No wheezing or rales.  Skin:    General: Skin is warm and dry.  Neurological:     Mental Status: He is alert and oriented to person, place, and time. Mental status is at baseline.  Psychiatric:        Mood  and Affect: Mood normal.        Behavior: Behavior normal.        Thought Content: Thought content normal.        Judgment: Judgment normal.       Results:   Studies obtained and personally reviewed by me:   Labs:       Component Value Date/Time   NA 137 06/15/2023 1339   NA 137 06/24/2021 1227   NA 140 09/14/2017 0928   K 3.8 06/15/2023 1339   K 4.0 09/14/2017 0928   CL 104 06/15/2023 1339   CL 104 04/17/2013 0857   CO2 21 (L) 06/15/2023 1339   CO2 27 09/14/2017 0928   GLUCOSE 97 06/15/2023 1339   GLUCOSE 113 09/14/2017 0928   GLUCOSE 105 (H) 04/17/2013 0857   BUN 32 (H) 06/15/2023 1339   BUN 30 (H) 06/24/2021 1227   BUN 28.3 (H) 09/14/2017 0928   CREATININE 1.41 (H) 06/15/2023 1339   CREATININE 1.18 01/25/2023 1001   CREATININE 1.56 (H) 08/19/2022 1120   CREATININE 1.4 (H) 09/14/2017 0928   CALCIUM 9.3 06/15/2023 1339   CALCIUM 10.1 09/14/2017 0928   PROT 6.6 02/24/2023 0914   PROT 8.0 09/14/2017 0928   ALBUMIN 3.8 01/25/2023 1001   ALBUMIN 3.6 09/14/2017 0928   AST 15 02/24/2023 0914   AST 14 (L) 01/25/2023 1001   AST 17 09/14/2017 0928   ALT 16 02/24/2023 0914   ALT 17 01/25/2023 1001   ALT 18 09/14/2017 0928   ALKPHOS 71 01/25/2023 1001   ALKPHOS 80 09/14/2017 0928   BILITOT 0.5 02/24/2023 0914   BILITOT 0.5 01/25/2023 1001   BILITOT 0.45 09/14/2017 0928   GFRNONAA 52 (L) 06/15/2023 1339   GFRNONAA >60 01/25/2023 1001   GFRNONAA 50 (L) 07/08/2020 0907   GFRAA 58 (L) 07/16/2020 1043   GFRAA 58 (L) 07/08/2020 0907     Lab Results  Component Value Date   WBC 11.5 (H) 09/13/2023   HGB 10.8 (L) 09/13/2023   HCT 33.6 (L) 09/13/2023   MCV 95.5 09/13/2023   PLT 291 09/13/2023    Lab Results  Component Value Date   CHOL 134 02/24/2023   HDL 67 02/24/2023   LDLCALC 53 02/24/2023   TRIG 60 02/24/2023   CHOLHDL 2.0 02/24/2023    Lab Results  Component Value Date   HGBA1C 6.2 (H) 06/15/2023     Lab Results  Component Value Date   TSH 1.32  02/06/2021     Lab Results  Component Value Date   PSA <0.04 08/19/2022   PSA <0.04 07/21/2021   PSA <0.1 07/08/2020      Assessment & Plan:   Iron  deficiency anemia: iron low at 35 on 09/13/23, up from 27 on 08/11/23. Hemoglobin low at 10.8 on 09/13/23, down from 11.5 on 08/11/23. He is on Folivane-Plus 1 capsule p.o. daily, ferrous sulfate TID. Had colonoscopy 2022.  Hx of MGUS- followed by Dr. Sammuel Cooper to follow-up with Cardiologist, Dr. Sinda Du not seen her recently  Status post left unicompartmental knee arthroplasty August 2024. Prescribed Norco 5-325 mg every 6 hours as needed (#15) tabs at his request for knee pain. Take sparingly.  Edema: encouraged to exercise regularly and elevate feet when sitting or lying down.   He will be scheduling an eye exam in the near future  Vaccine counseling:  administered flu booster. He will go to the pharmacy for the Covid-19 booster.   Return in 3 months for anemia follow-up.    I,Alexander Ruley,acting as a Neurosurgeon for Margaree Mackintosh, MD.,have documented all relevant documentation on the behalf of Margaree Mackintosh, MD,as directed by  Margaree Mackintosh, MD while in the presence of Margaree Mackintosh, MD.   I, Margaree Mackintosh, MD, have reviewed all documentation for this visit. The documentation on 09/15/23 for the exam, diagnosis, procedures, and orders are all accurate and complete.

## 2023-09-09 ENCOUNTER — Ambulatory Visit: Payer: Medicare Other | Admitting: Internal Medicine

## 2023-09-12 ENCOUNTER — Other Ambulatory Visit: Payer: Self-pay | Admitting: Internal Medicine

## 2023-09-13 ENCOUNTER — Other Ambulatory Visit: Payer: Medicare Other

## 2023-09-13 DIAGNOSIS — E1169 Type 2 diabetes mellitus with other specified complication: Secondary | ICD-10-CM

## 2023-09-13 DIAGNOSIS — E785 Hyperlipidemia, unspecified: Secondary | ICD-10-CM | POA: Diagnosis not present

## 2023-09-14 LAB — CBC WITH DIFFERENTIAL/PLATELET
Absolute Lymphocytes: 3715 {cells}/uL (ref 850–3900)
Absolute Monocytes: 725 {cells}/uL (ref 200–950)
Basophils Absolute: 23 {cells}/uL (ref 0–200)
Basophils Relative: 0.2 %
Eosinophils Absolute: 104 {cells}/uL (ref 15–500)
Eosinophils Relative: 0.9 %
HCT: 33.6 % — ABNORMAL LOW (ref 38.5–50.0)
Hemoglobin: 10.8 g/dL — ABNORMAL LOW (ref 13.2–17.1)
MCH: 30.7 pg (ref 27.0–33.0)
MCHC: 32.1 g/dL (ref 32.0–36.0)
MCV: 95.5 fL (ref 80.0–100.0)
MPV: 10 fL (ref 7.5–12.5)
Monocytes Relative: 6.3 %
Neutro Abs: 6935 {cells}/uL (ref 1500–7800)
Neutrophils Relative %: 60.3 %
Platelets: 291 10*3/uL (ref 140–400)
RBC: 3.52 10*6/uL — ABNORMAL LOW (ref 4.20–5.80)
RDW: 15 % (ref 11.0–15.0)
Total Lymphocyte: 32.3 %
WBC: 11.5 10*3/uL — ABNORMAL HIGH (ref 3.8–10.8)

## 2023-09-14 LAB — IRON,TIBC AND FERRITIN PANEL
%SAT: 13 % — ABNORMAL LOW (ref 20–48)
Ferritin: 119 ng/mL (ref 24–380)
Iron: 35 ug/dL — ABNORMAL LOW (ref 50–180)
TIBC: 270 ug/dL (ref 250–425)

## 2023-09-15 ENCOUNTER — Encounter: Payer: Self-pay | Admitting: Internal Medicine

## 2023-09-15 ENCOUNTER — Ambulatory Visit: Payer: Medicare Other | Admitting: Internal Medicine

## 2023-09-15 VITALS — BP 110/70 | HR 80 | Ht 69.0 in | Wt 269.0 lb

## 2023-09-15 DIAGNOSIS — I2510 Atherosclerotic heart disease of native coronary artery without angina pectoris: Secondary | ICD-10-CM | POA: Diagnosis not present

## 2023-09-15 DIAGNOSIS — D472 Monoclonal gammopathy: Secondary | ICD-10-CM

## 2023-09-15 DIAGNOSIS — E785 Hyperlipidemia, unspecified: Secondary | ICD-10-CM | POA: Diagnosis not present

## 2023-09-15 DIAGNOSIS — Z0000 Encounter for general adult medical examination without abnormal findings: Secondary | ICD-10-CM

## 2023-09-15 DIAGNOSIS — N1832 Chronic kidney disease, stage 3b: Secondary | ICD-10-CM

## 2023-09-15 DIAGNOSIS — Z8739 Personal history of other diseases of the musculoskeletal system and connective tissue: Secondary | ICD-10-CM

## 2023-09-15 DIAGNOSIS — E611 Iron deficiency: Secondary | ICD-10-CM | POA: Diagnosis not present

## 2023-09-15 DIAGNOSIS — E1169 Type 2 diabetes mellitus with other specified complication: Secondary | ICD-10-CM

## 2023-09-15 DIAGNOSIS — Z23 Encounter for immunization: Secondary | ICD-10-CM

## 2023-09-15 DIAGNOSIS — Z9079 Acquired absence of other genital organ(s): Secondary | ICD-10-CM

## 2023-09-15 DIAGNOSIS — Z8546 Personal history of malignant neoplasm of prostate: Secondary | ICD-10-CM

## 2023-09-15 MED ORDER — HYDROCODONE-ACETAMINOPHEN 5-325 MG PO TABS: 1.0000 | ORAL_TABLET | Freq: Four times a day (QID) | ORAL | 0 refills | Status: DC | PRN

## 2023-09-15 NOTE — Patient Instructions (Signed)
Continue with iron supplement 3 times daily.  Iron is slowly improving.  Patient remains anemic.  Continue close follow-up with Dr. Shirline Frees.  History of MGUS.  Has not seen Dr. Caleen Jobs lately and should call [facility] cardiology for appointment.  Recovering from left knee arthroplasty which was done August 2024.  You have prescribed small quantity (5 days) of Norco 5/325 to take for knee pain sparingly.  Encourage walking which will help lower extremity edema.  Needs to have eye exam in the near future.  Administered flu vaccine at his request.  Will have COVID-19 booster at pharmacy.  Follow-up in 3 months.

## 2023-09-16 NOTE — Addendum Note (Signed)
Addended by: Theresia Lo on: 09/16/2023 08:42 AM   Modules accepted: Level of Service

## 2023-09-19 NOTE — Progress Notes (Unsigned)
@Patient  ID: Leonard [name]., male    DOB: [birth year], 77 y.o.   MRN: [id number]  No chief complaint on file.   Referring provider: Margaree Mackintosh, MD  HPI: 77 year old male, never smoked.  Past medical history significant for hypertension, coronary artery disease, obstructive sleep apnea, type 2 diabetes, lipidemia, iron deficiency anemia, monoclonal gammopathy, history of prostate cancer, obesity.  Dr. Craige Cotta, last seen in March 2024.  PSG December 2008>> AHI 29/hr.   09/20/2023 Patient presents today for 47-month follow-up. Sleep study in December 2008 showed moderate to severe obstructive sleep apnea.  Maintained on CPAP 11cm h20 Received replacement CPAP machine  DME is [facility]    Allergies  Allergen Reactions   Dicloxacillin Rash and Other (See Comments)    Has patient had a PCN reaction causing immediate rash, facial/tongue/throat swelling, SOB or lightheadedness with hypotension: No Has patient had a PCN reaction causing severe rash involving mucus membranes or skin necrosis: No Has patient had a PCN reaction that required hospitalization: No Has patient had a PCN reaction occurring within the last 10 years: No If all of the above answers are "NO", then may proceed with Cephalosporin use.    Cayenne Pepper [Cayenne] Swelling   Nsaids Other (See Comments)    PT declines Acute renal failure 08/09/2015    Immunization History  Administered Date(s) Administered   Fluad Quad(high Dose 65+) 08/09/2022   Influenza Split 08/15/2013   Influenza Whole 07/17/2011, 09/15/2012   Influenza, Seasonal, Injecte, Preservative Fre 09/15/2023   Influenza,inj,Quad PF,6+ Mos 07/17/2015, 07/13/2016, 08/12/2017, 08/08/2018, 07/13/2019, 07/15/2020, 08/10/2021   Influenza-Unspecified 09/05/2014   Moderna SARS-COV2 Booster Vaccination 08/25/2022   PFIZER Comirnaty(Gray Top)Covid-19 Tri-Sucrose Vaccine 03/09/2021   PFIZER(Purple Top)SARS-COV-2 Vaccination 12/22/2019, 01/12/2020, 07/15/2020    PNEUMOCOCCAL CONJUGATE-20 03/25/2022   Pfizer Covid-19 Vaccine Bivalent Booster 14yrs & up 09/01/2021, 03/25/2022   Pneumococcal Conjugate-13 12/20/2014   Pneumococcal Polysaccharide-23 05/12/2006, 12/14/2013   Respiratory Syncytial Virus Vaccine,Recomb Aduvanted(Arexvy) 11/04/2022   Tdap 05/01/2010, 04/05/2022   Unspecified SARS-COV-2 Vaccination 08/25/2022   Zoster Recombinant(Shingrix) 09/04/2018, 01/03/2019    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 09/05/2006   EGD   CKD (chronic kidney disease)    Coronary artery disease    Diverticulosis of colon (without mention of hemorrhage) 09/05/2006   Colonoscopy    Gastric polyp 2014   done at [facility]   GERD (gastroesophageal reflux disease) 07/26/2001   EGD(Chronic)   Gouty arthropathy    Hemorrhoids    History of hiatal hernia    History of kidney stones    Hx of colonic polyps 09/05/2006   Colonoscopy(ADENOMATOUS POLYP)   Iron deficiency anemia, unspecified    Monoclonal gammopathy    OSA (obstructive sleep apnea)    USES CPAP MACHINE    Other and unspecified hyperlipidemia    Prostate cancer (HCC) 2015   Type II or unspecified type diabetes mellitus without mention of complication, not stated as uncontrolled    Unspecified essential hypertension     Tobacco History: Social History   Tobacco Use  Smoking Status Never  Smokeless Tobacco Never   Counseling given: Not Answered   Outpatient Medications Prior to Visit  Medication Sig Dispense Refill   allopurinol (ZYLOPRIM) 300 MG tablet TAKE 1 TABLET BY MOUTH DAILY TO PREVENT GOUT 90 tablet 3   amLODipine-benazepril (LOTREL) 10-20 MG capsule TAKE 1 CAPSULE BY MOUTH EVERY DAY 90 capsule 3   apixaban (ELIQUIS) 2.5 MG TABS tablet Take 1  tablet (2.5 mg total) by mouth 2 (two) times daily. 60 tablet 0   aspirin 81 MG tablet Take 81 mg by mouth daily.     atorvastatin (LIPITOR) 10 MG tablet TAKE 1 TABLET BY MOUTH EVERY DAY 90  tablet 3   baclofen (LIORESAL) 10 MG tablet Take 1 tablet (10 mg total) by mouth 3 (three) times daily. As needed for muscle spasm 50 tablet 0   esomeprazole (NEXIUM) 20 MG capsule Take 20 mg by mouth daily at 12 noon.     FeFum-FePoly-FA-B Cmp-C-Biot (FOLIVANE-PLUS) CAPS TAKE 1 CAPSULE BY MOUTH EVERY DAY IN THE MORNING 90 capsule 1   HYDROcodone-acetaminophen (NORCO) 5-325 MG tablet Take 1 tablet by mouth every 6 (six) hours as needed for moderate pain (pain score 4-6). 15 tablet 0   JANUVIA 100 MG tablet TAKE 1 TABLET BY MOUTH EVERY DAY 90 tablet 1   metFORMIN (GLUCOPHAGE) 500 MG tablet Take 1 tablet (500 mg total) by mouth 1 day or 1 dose. 90 tablet 3   methylPREDNISolone (MEDROL) 4 MG tablet Take as directed in tapering course 6-5-4-3-2-1 21 tablet 0   metoprolol succinate (TOPROL-XL) 25 MG 24 hr tablet TAKE 1/2 TABLET BY MOUTH DAILY 45 tablet 0   nitroGLYCERIN (NITROSTAT) 0.4 MG SL tablet Place 1 tablet (0.4 mg total) under the tongue every 5 (five) minutes as needed for chest pain. Reported on 03/04/2016 30 tablet 5   ondansetron (ZOFRAN) 4 MG tablet Take 1 tablet (4 mg total) by mouth every 8 (eight) hours as needed for nausea or vomiting. 10 tablet 0   OZEMPIC, 0.25 OR 0.5 MG/DOSE, 2 MG/3ML SOPN Inject 0.5 mg into the skin once a week.     sennosides-docusate sodium (SENOKOT-S) 8.6-50 MG tablet Take 2 tablets by mouth daily. 30 tablet 1   triamterene-hydrochlorothiazide (MAXZIDE-25) 37.5-25 MG tablet TAKE 1 TABLET BY MOUTH EVERY DAY 90 tablet 3   No facility-administered medications prior to visit.      Review of Systems  Review of Systems   Physical Exam  There were no vitals taken for this visit. Physical Exam   Lab Results:  CBC    Component Value Date/Time   WBC 11.5 (H) 09/13/2023 0954   RBC 3.52 (L) 09/13/2023 0954   HGB 10.8 (L) 09/13/2023 0954   HGB 11.5 (L) 08/11/2023 1422   HGB 11.9 (L) 09/14/2017 0928   HCT 33.6 (L) 09/13/2023 0954   HCT 36.3 (L) 09/14/2017  0928   PLT 291 09/13/2023 0954   PLT 236 08/11/2023 1422   PLT 284 09/14/2017 0928   MCV 95.5 09/13/2023 0954   MCV 95.8 09/14/2017 0928   MCH 30.7 09/13/2023 0954   MCHC 32.1 09/13/2023 0954   RDW 15.0 09/13/2023 0954   RDW 15.0 (H) 09/14/2017 0928   LYMPHSABS 3.1 08/11/2023 1422   LYMPHSABS 4.1 (H) 09/14/2017 0928   MONOABS 0.8 08/11/2023 1422   MONOABS 0.7 09/14/2017 0928   EOSABS 104 09/13/2023 0954   EOSABS 0.3 09/14/2017 0928   BASOSABS 23 09/13/2023 0954   BASOSABS 0.0 09/14/2017 0928    BMET    Component Value Date/Time   NA 137 06/15/2023 1339   NA 137 06/24/2021 1227   NA 140 09/14/2017 0928   K 3.8 06/15/2023 1339   K 4.0 09/14/2017 0928   CL 104 06/15/2023 1339   CL 104 04/17/2013 0857   CO2 21 (L) 06/15/2023 1339   CO2 27 09/14/2017 0928   GLUCOSE 97 06/15/2023 1339  GLUCOSE 113 09/14/2017 0928   GLUCOSE 105 (H) 04/17/2013 0857   BUN 32 (H) 06/15/2023 1339   BUN 30 (H) 06/24/2021 1227   BUN 28.3 (H) 09/14/2017 0928   CREATININE 1.41 (H) 06/15/2023 1339   CREATININE 1.18 01/25/2023 1001   CREATININE 1.56 (H) 08/19/2022 1120   CREATININE 1.4 (H) 09/14/2017 0928   CALCIUM 9.3 06/15/2023 1339   CALCIUM 10.1 09/14/2017 0928   GFRNONAA 52 (L) 06/15/2023 1339   GFRNONAA >60 01/25/2023 1001   GFRNONAA 50 (L) 07/08/2020 0907   GFRAA 58 (L) 07/16/2020 1043   GFRAA 58 (L) 07/08/2020 0907    BNP No results found for: "BNP"  ProBNP No results found for: "PROBNP"  Imaging: No results found.   Assessment & Plan:   No problem-specific Assessment & Plan notes found for this encounter.     Glenford Bayley, NP 09/19/2023

## 2023-09-20 ENCOUNTER — Encounter: Payer: Self-pay | Admitting: Primary Care

## 2023-09-20 ENCOUNTER — Ambulatory Visit (INDEPENDENT_AMBULATORY_CARE_PROVIDER_SITE_OTHER): Payer: Medicare Other | Admitting: Primary Care

## 2023-09-20 VITALS — BP 120/60 | HR 74 | Ht 69.0 in | Wt 264.8 lb

## 2023-09-20 DIAGNOSIS — G4733 Obstructive sleep apnea (adult) (pediatric): Secondary | ICD-10-CM

## 2023-09-20 NOTE — Patient Instructions (Addendum)
Good compliance with CPAP Continue to wear nightly 4-6 hours or longer You are till having some residual apneas on current pressure settings 11cm  Orders Adjust CPAP pressure 13cm h20   Follow-up 1 year with Beth Np or sooner if needed

## 2023-10-22 ENCOUNTER — Other Ambulatory Visit: Payer: Self-pay | Admitting: Internal Medicine

## 2023-11-22 ENCOUNTER — Encounter: Payer: Self-pay | Admitting: Internal Medicine

## 2023-11-24 ENCOUNTER — Encounter: Payer: Self-pay | Admitting: Internal Medicine

## 2023-11-28 NOTE — Progress Notes (Deleted)
  Cardiology Office Note:  .   Date:  11/28/2023  ID:  Leonard [name]., DOB [birth year], MRN [id number] PCP: Margaree Mackintosh, MD  [facility] Providers Cardiologist:  Dietrich Pates, MD { Click to update primary MD,subspecialty MD or APP then REFRESH:1}   Patient Profile: .      PMH: Coronary artery disease Nonobstructive CAD by cath 2007 Frequent PACs 6% on monitor 02/2021 SVT Monitor 02/2021: 14 SVT episodes all < seconds OSA Hypertension Prostate CA Hyperlipidemia GERD MGUS  Referred to cardiology and underwent cardiac catheterization in 2007 which revealed minimal, nonobstructive CAD.   Cardiac monitor 02/2021 for symptomatic PACs revealed 6% PAC burden and short runs of SVT.  He was placed on metoprolol which provided symptom relief.  Symptoms felt to be exacerbated by caffeine.  Last cardiology clinic visit was 10/27/2022 with Dr. Tenny Craw.  He was working with [facility] for weight loss and was on Ozempic.  He had evidence of LVH by voltage on EKG as well as Q waves noted in leads III and aVF.  He was not having any concerning cardiac symptoms.  Echocardiogram completed 11/30/2022 revealed normal LVEF 55 to 60%, mild LVH, G1 DD, normal RV, mild calcification of the aortic valve with no evidence of stenosis, mild dilatation of ascending aorta measuring 39 mm.  He had telephone evaluation for knee surgery 12/2022 and was deemed low risk for procedure.           History of Present Illness: .   Wilian [name]. is a *** 78 y.o. male who is here today for follow-up.    Discussed the use of AI scribe software for clinical note transcription with the patient, who gave verbal consent to proceed.   ROS: ***       Studies Reviewed: .        *** Risk Assessment/Calculations:   {Does this patient have ATRIAL FIBRILLATION?:[id number]} No BP recorded.  {Refresh Note OR Click here to enter BP  :1}***       Physical Exam:   VS:  There were no vitals taken for this visit.    Wt Readings from Last 3 Encounters:  09/20/23 264 lb 12.8 oz (120.1 kg)  09/15/23 269 lb (122 kg)  08/18/23 263 lb 6.4 oz (119.5 kg)    GEN: Well nourished, well developed in no acute distress NECK: No JVD; No carotid bruits CARDIAC: ***RRR, no murmurs, rubs, gallops RESPIRATORY:  Clear to auscultation without rales, wheezing or rhonchi  ABDOMEN: Soft, non-tender, non-distended EXTREMITIES:  No edema; No deformity     ASSESSMENT AND PLAN: .    CAD: Frequent PACs: Hyperlipidemia LDL goal < 70: Hypertension: Obesity:    {Are you ordering a CV Procedure (e.g. stress test, cath, DCCV, TEE, etc)?   Press F2        :[id number]}  Dispo: ***  Signed, Eligha Bridegroom, NP-C

## 2023-11-29 DIAGNOSIS — I10 Essential (primary) hypertension: Secondary | ICD-10-CM | POA: Diagnosis not present

## 2023-11-29 DIAGNOSIS — Z6839 Body mass index (BMI) 39.0-39.9, adult: Secondary | ICD-10-CM | POA: Diagnosis not present

## 2023-11-29 DIAGNOSIS — E1169 Type 2 diabetes mellitus with other specified complication: Secondary | ICD-10-CM | POA: Diagnosis not present

## 2023-12-01 ENCOUNTER — Ambulatory Visit: Payer: Medicare Other | Admitting: Nurse Practitioner

## 2023-12-05 DIAGNOSIS — M1712 Unilateral primary osteoarthritis, left knee: Secondary | ICD-10-CM | POA: Diagnosis not present

## 2023-12-06 ENCOUNTER — Encounter: Payer: Self-pay | Admitting: Internal Medicine

## 2023-12-11 NOTE — Progress Notes (Unsigned)
Cardiology Office Note:  .   Date:  12/14/2023  ID:  Leonard [name]., DOB 03/14/1946, MRN [id number] PCP: Leonard Mackintosh, MD  [facility] Providers Cardiologist:  Leonard Pates, MD    History of Present Illness: .   Leonard [name]. is a 78 y.o. male with a h/o HTN. OSA, minimal CAD (cath 2007),  MGUS, prostate CA and HLD.  Patient comes in for yearly f/u. Denies chest pain, dyspnea, dizziness, palpitations, edema. Had knee surgery in Aug but and did rehab but no regular exercise.    ROS:    Studies Reviewed: Marland Kitchen    EKG Interpretation Date/Time:  Wednesday December 14 2023 13:57:08 EST Ventricular Rate:  74 PR Interval:  206 QRS Duration:  86 QT Interval:  364 QTC Calculation: 404 R Axis:   -10  Text Interpretation: Sinus rhythm with Premature atrial complexes Minimal voltage criteria for LVH, may be normal variant ( R in aVL ) Nonspecific ST and T wave abnormality When compared with ECG of 08-May-2016 10:52, PREVIOUS ECG IS PRESENT Confirmed by Leonard [name] [phone]) on 12/14/2023 2:03:38 PM    Prior CV Studies:   Echo 11/2022 IMPRESSIONS     1. Left ventricular ejection fraction, by estimation, is 55 to 60%. The  left ventricle has normal function. The left ventricle has no regional  wall motion abnormalities. There is mild concentric left ventricular  hypertrophy. Left ventricular diastolic  parameters are consistent with Grade I diastolic dysfunction (impaired  relaxation).   2. Right ventricular systolic function is normal. The right ventricular  size is normal. There is normal pulmonary artery systolic pressure. The  estimated right ventricular systolic pressure is 20.1 mmHg.   3. The mitral valve is normal in structure. Trivial mitral valve  regurgitation. No evidence of mitral stenosis.   4. The aortic valve is tricuspid. There is mild calcification of the  aortic valve. Aortic valve regurgitation is not visualized. No aortic  stenosis is present.   5.  Aortic dilatation noted. There is mild dilatation of the ascending  aorta, measuring 39 mm.   6. The inferior vena cava is normal in size with greater than 50%  respiratory variability, suggesting right atrial pressure of 3 mmHg.    Risk Assessment/Calculations:             Physical Exam:   VS:  BP 112/70   Pulse 74   Ht 5\' 9"  (1.753 m)   Wt 274 lb (124.3 kg)   SpO2 95%   BMI 40.46 kg/m    Wt Readings from Last 3 Encounters:  12/14/23 274 lb (124.3 kg)  09/20/23 264 lb 12.8 oz (120.1 kg)  09/15/23 269 lb (122 kg)    GEN: Obese in no acute distress NECK: No JVD; No carotid bruits CARDIAC:  RRR, no murmurs, rubs, gallops RESPIRATORY:  Clear to auscultation without rales, wheezing or rhonchi  ABDOMEN: Soft, non-tender, non-distended EXTREMITIES:  No edema; No deformity   ASSESSMENT AND PLAN: .    CAD Minimal on cath in 2007  No symptoms  continue ASA, toprol, lipitor      Abnormal EKG   LVH by voltages  Q waves III AVF normal LVEF with mild LVH on echo 11/2022      HTN   BP is controlled on current regimen   HLD  due for FLP will order, continue lipitor(had CBC and iron studies yest and will see Dr. Cena [name] who may draw blood.  DM   A1C 6.2 05/1023     OSA compliant with CPAP   Obesity-exercise and weight loss discussed        Dispo: f/u in a year  Signed, Leonard Reedy, PA-C

## 2023-12-13 ENCOUNTER — Other Ambulatory Visit: Payer: Medicare Other

## 2023-12-13 DIAGNOSIS — E611 Iron deficiency: Secondary | ICD-10-CM | POA: Diagnosis not present

## 2023-12-14 ENCOUNTER — Ambulatory Visit: Payer: Medicare Other | Attending: Physician Assistant | Admitting: Physician Assistant

## 2023-12-14 ENCOUNTER — Encounter: Payer: Self-pay | Admitting: Physician Assistant

## 2023-12-14 ENCOUNTER — Encounter: Payer: Self-pay | Admitting: Internal Medicine

## 2023-12-14 VITALS — BP 112/70 | HR 74 | Ht 69.0 in | Wt 274.0 lb

## 2023-12-14 DIAGNOSIS — I471 Supraventricular tachycardia, unspecified: Secondary | ICD-10-CM | POA: Diagnosis not present

## 2023-12-14 DIAGNOSIS — E119 Type 2 diabetes mellitus without complications: Secondary | ICD-10-CM

## 2023-12-14 DIAGNOSIS — E785 Hyperlipidemia, unspecified: Secondary | ICD-10-CM

## 2023-12-14 DIAGNOSIS — I10 Essential (primary) hypertension: Secondary | ICD-10-CM | POA: Diagnosis not present

## 2023-12-14 DIAGNOSIS — I2510 Atherosclerotic heart disease of native coronary artery without angina pectoris: Secondary | ICD-10-CM

## 2023-12-14 LAB — CBC WITH DIFFERENTIAL/PLATELET
Absolute Lymphocytes: 3791 {cells}/uL (ref 850–3900)
Absolute Monocytes: 675 {cells}/uL (ref 200–950)
Basophils Absolute: 29 {cells}/uL (ref 0–200)
Basophils Relative: 0.3 %
Eosinophils Absolute: 276 {cells}/uL (ref 15–500)
Eosinophils Relative: 2.9 %
HCT: 37.5 % — ABNORMAL LOW (ref 38.5–50.0)
Hemoglobin: 12.2 g/dL — ABNORMAL LOW (ref 13.2–17.1)
MCH: 31.3 pg (ref 27.0–33.0)
MCHC: 32.5 g/dL (ref 32.0–36.0)
MCV: 96.2 fL (ref 80.0–100.0)
MPV: 9.9 fL (ref 7.5–12.5)
Monocytes Relative: 7.1 %
Neutro Abs: 4731 {cells}/uL (ref 1500–7800)
Neutrophils Relative %: 49.8 %
Platelets: 290 10*3/uL (ref 140–400)
RBC: 3.9 10*6/uL — ABNORMAL LOW (ref 4.20–5.80)
RDW: 14.4 % (ref 11.0–15.0)
Total Lymphocyte: 39.9 %
WBC: 9.5 10*3/uL (ref 3.8–10.8)

## 2023-12-14 LAB — IRON,TIBC AND FERRITIN PANEL
%SAT: 17 % — ABNORMAL LOW (ref 20–48)
Ferritin: 74 ng/mL (ref 24–380)
Iron: 55 ug/dL (ref 50–180)
TIBC: 327 ug/dL (ref 250–425)

## 2023-12-14 NOTE — Patient Instructions (Signed)
Medication Instructions:  Your physician recommends that you continue on your current medications as directed. Please refer to the Current Medication list given to you today.  *If you need a refill on your cardiac medications before your next appointment, please call your pharmacy*   Lab Work: You need to have a fasting LIPID panel done.  When you see your PCP, if she doesn't draw it, please call me [phone] so we can get a order in.  If you have labs (blood work) drawn today and your tests are completely normal, you will receive your results only by: MyChart Message (if you have MyChart) OR A paper copy in the mail If you have any lab test that is abnormal or we need to change your treatment, we will call you to review the results.   Testing/Procedures: None ordered   Follow-Up: At [facility], you and your health needs are our priority.  As part of our continuing mission to provide you with exceptional heart care, we have created designated Provider Care Teams.  These Care Teams include your primary Cardiologist (physician) and Advanced Practice Providers (APPs -  Physician Assistants and Nurse Practitioners) who all work together to provide you with the care you need, when you need it.  We recommend signing up for the patient portal called "MyChart".  Sign up information is provided on this After Visit Summary.  MyChart is used to connect with patients for Virtual Visits (Telemedicine).  Patients are able to view lab/test results, encounter notes, upcoming appointments, etc.  Non-urgent messages can be sent to your provider as well.   To learn more about what you can do with MyChart, go to [email or web address].    Your next appointment:   12 month(s)  Provider:   Dietrich Pates, MD  or Jacolyn Reedy, PA-C or Tereso Newcomer, PA-C         Other Instructions You need to have at least 150 minutes of exercise a week     1st Floor: - Lobby - Registration  - Pharmacy  -  Lab - Cafe  2nd Floor: - PV Lab - Diagnostic Testing (echo, CT, nuclear med)  3rd Floor: - Vacant  4th Floor: - TCTS (cardiothoracic surgery) - AFib Clinic - Structural Heart Clinic - Vascular Surgery  - Vascular Ultrasound  5th Floor: - HeartCare Cardiology (general and EP) - Clinical Pharmacy for coumadin, hypertension, lipid, weight-loss medications, and med management appointments    Valet parking services will be available as well.

## 2023-12-16 ENCOUNTER — Ambulatory Visit (INDEPENDENT_AMBULATORY_CARE_PROVIDER_SITE_OTHER): Payer: Medicare Other | Admitting: Internal Medicine

## 2023-12-16 ENCOUNTER — Encounter: Payer: Self-pay | Admitting: Internal Medicine

## 2023-12-16 VITALS — BP 130/70 | HR 74 | Ht 69.0 in | Wt 275.0 lb

## 2023-12-16 DIAGNOSIS — Z8546 Personal history of malignant neoplasm of prostate: Secondary | ICD-10-CM

## 2023-12-16 DIAGNOSIS — E1169 Type 2 diabetes mellitus with other specified complication: Secondary | ICD-10-CM | POA: Diagnosis not present

## 2023-12-16 DIAGNOSIS — E119 Type 2 diabetes mellitus without complications: Secondary | ICD-10-CM

## 2023-12-16 DIAGNOSIS — I2510 Atherosclerotic heart disease of native coronary artery without angina pectoris: Secondary | ICD-10-CM | POA: Diagnosis not present

## 2023-12-16 DIAGNOSIS — Z8639 Personal history of other endocrine, nutritional and metabolic disease: Secondary | ICD-10-CM | POA: Diagnosis not present

## 2023-12-16 DIAGNOSIS — Z8739 Personal history of other diseases of the musculoskeletal system and connective tissue: Secondary | ICD-10-CM

## 2023-12-16 DIAGNOSIS — D472 Monoclonal gammopathy: Secondary | ICD-10-CM | POA: Diagnosis not present

## 2023-12-16 DIAGNOSIS — N1831 Chronic kidney disease, stage 3a: Secondary | ICD-10-CM | POA: Diagnosis not present

## 2023-12-16 DIAGNOSIS — Z9079 Acquired absence of other genital organ(s): Secondary | ICD-10-CM | POA: Diagnosis not present

## 2023-12-16 DIAGNOSIS — E785 Hyperlipidemia, unspecified: Secondary | ICD-10-CM

## 2023-12-16 DIAGNOSIS — I10 Essential (primary) hypertension: Secondary | ICD-10-CM

## 2023-12-16 DIAGNOSIS — Z860101 Personal history of adenomatous and serrated colon polyps: Secondary | ICD-10-CM

## 2023-12-16 DIAGNOSIS — Z96652 Presence of left artificial knee joint: Secondary | ICD-10-CM

## 2023-12-16 DIAGNOSIS — Z6841 Body Mass Index (BMI) 40.0 and over, adult: Secondary | ICD-10-CM

## 2023-12-16 NOTE — Progress Notes (Addendum)
Patient Care Team: Margaree Mackintosh, MD as PCP - General (Internal Medicine) Pricilla Riffle, MD as PCP - Cardiology (Cardiology) Su Grand, MD (Inactive) (Urology)  Visit Date: 12/16/23  Subjective:   Chief Complaint  Patient presents with   Medical Management of Chronic Issues   Iron Deficiency   Patient ON:GEXBM Leonard MIRABAL Jr.,Male DOB:[birth year],77 y.o. WUX:[id number]  Patient has a past medical history of monoclonal gammopathy of undetermined significance, iron deficiency anemia, prostate adenocarcinoma.  78 y.o. Male presents today for 3 months follow-up for Iron Deficiency Anemia. Treated with Folivane-Plus 1 capsule daily 12/13/23 Iron 55, improved from 35 on 09/13/23; %SAT 17, increased from 13 but still low; RBC 3.90, increased from 3.52 but still low; Hemoglobin 12.2, increased from 10.8 on 09/13/23, but still low; HCT 37.5, increased from 33.6, but still low. Iron level has improved from 35 in Fall 2024 to 55. Has started incorporating more red meat into his diet. Followed by Dr. Si Gaul at [facility], next sees him 02/23/24.    S/p Left Unicompartmental Knee Arthroplasty 06/28/23. Still progressing in his healing. Still exercising.   Was seen by Cardiology a couple days ago (12/14/23) for his CAD Involving Native Coronary Artery Of Native Heart w/o Angina Pectoris, SVT (Supraventricular Tachycardia), Essential Hypertension, Hyperlipidemia, Unspecified, Morbid Obesity, Controlled Type 2 Diabetes Mellitus Without Complication, Unspecified Whether Long Term Insulin Use and they would like him to have his fasting Lipid Panel drawn. His HLD is treated with 10 mg Atorvastatin daily. Endorses LE edema.   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 09/05/2006   EGD   CKD (chronic kidney disease)    Coronary artery disease    Diverticulosis of colon (without mention of hemorrhage) 09/05/2006   Colonoscopy    Gastric polyp 2014    done at [facility]   GERD (gastroesophageal reflux disease) 07/26/2001   EGD(Chronic)   Gouty arthropathy    Hemorrhoids    History of hiatal hernia    History of kidney stones    Hx of colonic polyps 09/05/2006   Colonoscopy(ADENOMATOUS POLYP)   Iron deficiency anemia, unspecified    Monoclonal gammopathy    OSA (obstructive sleep apnea)    USES CPAP MACHINE    Other and unspecified hyperlipidemia    Prostate cancer (HCC) 2015   Type II or unspecified type diabetes mellitus without mention of complication, not stated as uncontrolled    Unspecified essential hypertension     Family History  Problem Relation Age of Onset   Heart attack Father    Diabetes Mother    Diabetes Brother    Colon cancer Neg Hx    Stomach cancer Neg Hx    Esophageal cancer Neg Hx    Rectal cancer Neg Hx    Liver cancer Neg Hx    Social Hx: Leonard [name], Leonard and retired from [employer]. Married. Does not smoke or consume alcohol.  Review of Systems  Constitutional:  Negative for fever and malaise/fatigue.  HENT:  Negative for congestion.   Eyes:  Negative for blurred vision.  Respiratory:  Negative for cough and shortness of breath.   Cardiovascular:  Positive for leg swelling. Negative for chest pain and palpitations.  Gastrointestinal:  Negative for vomiting.  Musculoskeletal:  Negative for back pain.  Skin:  Negative for rash.  Neurological:  Negative for loss of consciousness and headaches.     Objective:  Vitals: BP 130/70   Pulse 74  Ht 5\' 9"  (1.753 m)   Wt 275 lb (124.7 kg)   SpO2 95%   BMI 40.61 kg/m   Physical Exam Constitutional:      General: Leonard is not in acute distress.    Appearance: Normal appearance. Leonard is not ill-appearing.  HENT:     Head: Normocephalic and atraumatic.  Neck:     Vascular: No carotid bruit.  Cardiovascular:     Rate and Rhythm: Normal rate and regular rhythm. Occasional Extrasystoles are present.    Pulses: Normal pulses.     Heart sounds: Murmur (2/6) heard.      Systolic (systolic ejection) murmur is present with a grade of 2/6.     No friction rub. No gallop.     Comments: Occasional irregular contraction Pulmonary:     Effort: Pulmonary effort is normal. No respiratory distress.     Breath sounds: Normal breath sounds. No wheezing or rales.  Skin:    General: Skin is warm and dry.  Neurological:     Mental Status: Leonard is alert and oriented to person, place, and time. Mental status is at baseline.  Psychiatric:        Mood and Affect: Mood normal.        Behavior: Behavior normal.        Thought Content: Thought content normal.        Judgment: Judgment normal.     Results:  Studies Obtained And Personally Reviewed By Me: Labs:     Component Value Date/Time   NA 137 06/15/2023 1339   NA 137 06/24/2021 1227   NA 140 09/14/2017 0928   K 3.8 06/15/2023 1339   K 4.0 09/14/2017 0928   CL 104 06/15/2023 1339   CL 104 04/17/2013 0857   CO2 21 (L) 06/15/2023 1339   CO2 27 09/14/2017 0928   GLUCOSE 97 06/15/2023 1339   GLUCOSE 113 09/14/2017 0928   GLUCOSE 105 (H) 04/17/2013 0857   BUN 32 (H) 06/15/2023 1339   BUN 30 (H) 06/24/2021 1227   BUN 28.3 (H) 09/14/2017 0928   CREATININE 1.41 (H) 06/15/2023 1339   CREATININE 1.18 01/25/2023 1001   CREATININE 1.56 (H) 08/19/2022 1120   CREATININE 1.4 (H) 09/14/2017 0928   CALCIUM 9.3 06/15/2023 1339   CALCIUM 10.1 09/14/2017 0928   PROT 6.6 02/24/2023 0914   PROT 8.0 09/14/2017 0928   ALBUMIN 3.8 01/25/2023 1001   ALBUMIN 3.6 09/14/2017 0928   AST 15 02/24/2023 0914   AST 14 (L) 01/25/2023 1001   AST 17 09/14/2017 0928   ALT 16 02/24/2023 0914   ALT 17 01/25/2023 1001   ALT 18 09/14/2017 0928   ALKPHOS 71 01/25/2023 1001   ALKPHOS 80 09/14/2017 0928   BILITOT 0.5 02/24/2023 0914   BILITOT 0.5 01/25/2023 1001   BILITOT 0.45 09/14/2017 0928   GFRNONAA 52 (L) 06/15/2023 1339   GFRNONAA >60 01/25/2023 1001   GFRNONAA 50 (L) 07/08/2020 0907   GFRAA 58 (L) 07/16/2020 1043   GFRAA 58  (L) 07/08/2020 0907    Lab Results  Component Value Date   WBC 9.5 12/13/2023   HGB 12.2 (L) 12/13/2023   HCT 37.5 (L) 12/13/2023   MCV 96.2 12/13/2023   PLT 290 12/13/2023   Lab Results  Component Value Date   CHOL 134 02/24/2023   HDL 67 02/24/2023   LDLCALC 53 02/24/2023   TRIG 60 02/24/2023   CHOLHDL 2.0 02/24/2023   Lab Results  Component Value Date  HGBA1C 6.2 (H) 06/15/2023    Lab Results  Component Value Date   TSH 1.32 02/06/2021     Lab Results  Component Value Date   PSA <0.04 08/19/2022   PSA <0.04 07/21/2021   PSA <0.1 07/08/2020   Assessment & Plan:   Orders Placed This Encounter  Procedures   Lipid panel   Basic Metabolic Panel   Iron Deficiency Anemia treated with Folivane-Plus 1 capsule daily 12/13/23 Iron 55, improved from 35 on 09/13/23; %SAT 17, increased from 13 but still low; RBC 3.90, increased from 3.52 but still low; Hemoglobin 12.2, increased from 10.8 on 09/13/23, but still low; HCT 37.5, increased from 33.6, but still low. Has started incorporating more red meat into his diet. Followed by Dr. Si Gaul at [facility], next sees him 02/23/24.    S/p Left Unicompartmental Knee Arthroplasty 06/28/23. Still progressing in his healing. Still exercising.   Was seen by cardiology a couple days ago (12/14/23) and they would like him to have his fasting Lipid Panel & B-MET drawn - Leonard is fasting today, will go ahead and order. Hyperlipidemia is treated with 10 mg Atorvastatin daily.   Return April 25 for your labs and April 29 9:30 AM for your next follow-up   Annual wellness visit labs beings done October 31 with your visit scheduled for November 4  I,Emily Lagle,acting as a Neurosurgeon for Margaree Mackintosh, MD.,have documented all relevant documentation on the behalf of Margaree Mackintosh, MD,as directed by  Margaree Mackintosh, MD while in the presence of Margaree Mackintosh, MD.  I, Margaree Mackintosh, MD, have reviewed all documentation for this visit. The  documentation on 12/16/23 for the exam, diagnosis, procedures, and orders are all accurate and complete.

## 2023-12-16 NOTE — Patient Instructions (Addendum)
Labs checked as requested by Cardiology. He seems to be doing well. No change in medications. Return in 6 months for Medicare wellness and Annual exam.

## 2023-12-17 LAB — BASIC METABOLIC PANEL
BUN/Creatinine Ratio: 14 (calc) (ref 6–22)
BUN: 27 mg/dL — ABNORMAL HIGH (ref 7–25)
CO2: 25 mmol/L (ref 20–32)
Calcium: 9.5 mg/dL (ref 8.6–10.3)
Chloride: 105 mmol/L (ref 98–110)
Creat: 1.91 mg/dL — ABNORMAL HIGH (ref 0.70–1.28)
Glucose, Bld: 95 mg/dL (ref 65–99)
Potassium: 4.5 mmol/L (ref 3.5–5.3)
Sodium: 141 mmol/L (ref 135–146)

## 2023-12-17 LAB — LIPID PANEL
Cholesterol: 150 mg/dL (ref <200)
HDL: 85 mg/dL (ref >=40)
LDL Cholesterol (Calc): 50 mg/dL
Non-HDL Cholesterol (Calc): 65 mg/dL (ref <130)
Total CHOL/HDL Ratio: 1.8 (calc) (ref <5.0)
Triglycerides: 66 mg/dL (ref <150)

## 2023-12-19 ENCOUNTER — Telehealth: Payer: Self-pay

## 2023-12-19 DIAGNOSIS — I10 Essential (primary) hypertension: Secondary | ICD-10-CM

## 2023-12-19 MED ORDER — TRIAMTERENE-HCTZ 37.5-25 MG PO TABS: 0.5000 | ORAL_TABLET | Freq: Every day | ORAL | Status: DC

## 2023-12-19 NOTE — Telephone Encounter (Signed)
Discussed lab results with patient.  Per Jacolyn Reedy, PA-C: Kidney function up. Try to decrease maxide to 1/2 tablet daily and repeat bmet in 2 weeks. Lipids look good. Keep track of BP thanks    Med list updated. BMET ordered and released to [facility].  Patient verbalized understanding of the above.

## 2023-12-19 NOTE — Telephone Encounter (Signed)
-----   Message from Jacolyn Reedy sent at 12/17/2023 10:17 AM EST ----- Kidney function up. Try to decrease maxide to 1/2 tablet daily and repeat bmet in 2 weeks. Lipids look good. Keep track of BP thanks

## 2023-12-21 DIAGNOSIS — I10 Essential (primary) hypertension: Secondary | ICD-10-CM | POA: Diagnosis not present

## 2023-12-21 DIAGNOSIS — Z6839 Body mass index (BMI) 39.0-39.9, adult: Secondary | ICD-10-CM | POA: Diagnosis not present

## 2023-12-21 DIAGNOSIS — E1169 Type 2 diabetes mellitus with other specified complication: Secondary | ICD-10-CM | POA: Diagnosis not present

## 2023-12-23 ENCOUNTER — Other Ambulatory Visit: Payer: Self-pay | Admitting: Internal Medicine

## 2024-01-13 ENCOUNTER — Other Ambulatory Visit: Payer: Self-pay | Admitting: Internal Medicine

## 2024-01-13 ENCOUNTER — Other Ambulatory Visit: Payer: Self-pay | Admitting: Physician Assistant

## 2024-01-13 DIAGNOSIS — D649 Anemia, unspecified: Secondary | ICD-10-CM

## 2024-01-25 ENCOUNTER — Telehealth: Payer: Self-pay

## 2024-01-25 ENCOUNTER — Telehealth: Admitting: Internal Medicine

## 2024-01-25 ENCOUNTER — Encounter: Payer: Self-pay | Admitting: Internal Medicine

## 2024-01-25 DIAGNOSIS — R051 Acute cough: Secondary | ICD-10-CM

## 2024-01-25 DIAGNOSIS — R6883 Chills (without fever): Secondary | ICD-10-CM | POA: Diagnosis not present

## 2024-01-25 DIAGNOSIS — G4483 Primary cough headache: Secondary | ICD-10-CM

## 2024-01-25 MED ORDER — HYDROCODONE BIT-HOMATROP MBR 5-1.5 MG/5ML PO SOLN: 5.0000 mL | Freq: Three times a day (TID) | ORAL | 0 refills | Status: DC | PRN

## 2024-01-25 MED ORDER — AZITHROMYCIN 250 MG PO TABS
ORAL_TABLET | ORAL | 0 refills | Status: AC
Start: 2024-01-25 — End: 2024-01-30

## 2024-01-25 NOTE — Patient Instructions (Addendum)
 We are sorry you are not feeling well.  Suspect you have either influenza or COVID-19 virus infection.  Would suspect you might be more likely to have COVID since you have not been recently vaccinated for COVID.  However understand both home tests were negative today.  Please take Zithromax Z-PAK 2 tabs day 1 followed by 1 tab days 2 through 5 and Hycodan 1 teaspoon every 8 hours as needed for cough.  Rest at home, monitor pulse oximetry and stay well-hydrated.  Call if symptoms worsen or fail to improve within a couple of days.

## 2024-01-25 NOTE — Progress Notes (Signed)
   Subjective:    Patient ID: Leonard [name]., male    DOB: 29-Jul-1946, 78 y.o.   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  He is a longstanding patient in this practice.  He is agreeable to visit in this format today.  Location: He is at his home and I am at my office.  His wife is present during the encounter.  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  Symptoms started Monday evening March 10.    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  He is married.  He resides at home with his wife.  He did receive a flu vaccine September 15, 2023.  This was done through this office.  Has not had a recent COVID-vaccine.  Last COVID-vaccine on file was in 2023.  He has history of adenocarcinoma of the prostate that is followed at [facility].  He is status post prostatectomy.  History of morbid obesity and in August 2024 had arthroplasty of left knee.  History of chronic kidney disease stage III.  History of controlled type 2 diabetes without the use of insulin.  History of adenomatous colon polyp.  History of hyperlipidemia.  History of coronary artery disease with cardiac cath in 2007 showing minimal coronary artery disease.  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  He is chronically anemic.  He has obstructive sleep apnea and uses a CPAP device.  He does not smoke or consume alcohol.  Had robotic prostatectomy June 2015 by Dr. Berneice Heinrich for prostate cancer.  In 2020 he had acute pyelonephritis treated as an outpatient.  He was seen here in October 2024 and his weight at that time was 269 pounds with a BMI of 39.72    Review of Systems he has no vomiting.     Objective:   Physical Exam  On virtual visit, he looks fatigued but does not appear to be tachypneic and currently is not coughing  Wife went out and purchased both flu and COVID tests this  morning and reported that both of these were negative     Assessment & Plan:   I suspect he may have COVID since he has not had a recent COVID-vaccine.  He did receive a flu vaccine in the Fall.  Since we do not have a positive COVID test nor a positive flu test I am suggested he take Zithromax 2 tablets day 1 followed by 1 tablet days 2 through 5 and sending in Hycodan cough syrup 1 teaspoon every 8 hours as needed for cough.  He should quarantine at home for 5 days.  He should rest and stay well-hydrated.  He should walk around his home some to prevent atelectasis.  It would be good for him to monitor pulse oximetry with finger oximetry device.  He will call if symptoms worsen or if he is not improving within 48 hours.

## 2024-01-25 NOTE — Telephone Encounter (Signed)
 Patient's wife is calling she thinks he has the flu he has a fever, coughing, headache, chills symptoms started Monday evening.  She going to buy a home flu/covid test and call back with results.

## 2024-02-06 NOTE — Telephone Encounter (Signed)
 Pt had appt on 01/25/24

## 2024-02-16 ENCOUNTER — Inpatient Hospital Stay: Payer: Medicare Other | Attending: Internal Medicine

## 2024-02-16 DIAGNOSIS — Z8546 Personal history of malignant neoplasm of prostate: Secondary | ICD-10-CM | POA: Insufficient documentation

## 2024-02-16 DIAGNOSIS — D509 Iron deficiency anemia, unspecified: Secondary | ICD-10-CM | POA: Diagnosis not present

## 2024-02-16 DIAGNOSIS — Z79899 Other long term (current) drug therapy: Secondary | ICD-10-CM | POA: Insufficient documentation

## 2024-02-16 DIAGNOSIS — D472 Monoclonal gammopathy: Secondary | ICD-10-CM | POA: Diagnosis not present

## 2024-02-16 DIAGNOSIS — D508 Other iron deficiency anemias: Secondary | ICD-10-CM

## 2024-02-16 LAB — CBC WITH DIFFERENTIAL (CANCER CENTER ONLY)
Abs Immature Granulocytes: 0.01 10*3/uL (ref 0.00–0.07)
Basophils Absolute: 0 10*3/uL (ref 0.0–0.1)
Basophils Relative: 0 %
Eosinophils Absolute: 0.1 10*3/uL (ref 0.0–0.5)
Eosinophils Relative: 1 %
HCT: 34.9 % — ABNORMAL LOW (ref 39.0–52.0)
Hemoglobin: 11.9 g/dL — ABNORMAL LOW (ref 13.0–17.0)
Immature Granulocytes: 0 %
Lymphocytes Relative: 38 %
Lymphs Abs: 4.2 10*3/uL — ABNORMAL HIGH (ref 0.7–4.0)
MCH: 32.7 pg (ref 26.0–34.0)
MCHC: 34.1 g/dL (ref 30.0–36.0)
MCV: 95.9 fL (ref 80.0–100.0)
Monocytes Absolute: 0.9 10*3/uL (ref 0.1–1.0)
Monocytes Relative: 8 %
Neutro Abs: 5.8 10*3/uL (ref 1.7–7.7)
Neutrophils Relative %: 53 %
Platelet Count: 258 10*3/uL (ref 150–400)
RBC: 3.64 MIL/uL — ABNORMAL LOW (ref 4.22–5.81)
RDW: 15.1 % (ref 11.5–15.5)
WBC Count: 11 10*3/uL — ABNORMAL HIGH (ref 4.0–10.5)
nRBC: 0 % (ref 0.0–0.2)

## 2024-02-16 LAB — LACTATE DEHYDROGENASE: LDH: 154 U/L (ref 98–192)

## 2024-02-16 LAB — IRON AND IRON BINDING CAPACITY (CC-WL,HP ONLY)
Iron: 43 ug/dL — ABNORMAL LOW (ref 45–182)
Saturation Ratios: 13 % — ABNORMAL LOW (ref 17.9–39.5)
TIBC: 344 ug/dL (ref 250–450)
UIBC: 301 ug/dL (ref 117–376)

## 2024-02-16 LAB — FERRITIN: Ferritin: 70 ng/mL (ref 24–336)

## 2024-02-17 LAB — BETA 2 MICROGLOBULIN, SERUM: Beta-2 Microglobulin: 2.5 mg/L — ABNORMAL HIGH (ref 0.6–2.4)

## 2024-02-17 LAB — IGG, IGA, IGM
IgA: 298 mg/dL (ref 61–437)
IgG (Immunoglobin G), Serum: 1122 mg/dL (ref 603–1613)
IgM (Immunoglobulin M), Srm: 208 mg/dL — ABNORMAL HIGH (ref 15–143)

## 2024-02-17 LAB — KAPPA/LAMBDA LIGHT CHAINS
Kappa free light chain: 51.6 mg/L — ABNORMAL HIGH (ref 3.3–19.4)
Kappa, lambda light chain ratio: 1.84 — ABNORMAL HIGH (ref 0.26–1.65)
Lambda free light chains: 28.1 mg/L — ABNORMAL HIGH (ref 5.7–26.3)

## 2024-02-17 NOTE — Progress Notes (Signed)
 Leonard [name] and he said he was taking his prescription and a supplement.

## 2024-02-20 NOTE — Progress Notes (Unsigned)
 [facility] OFFICE PROGRESS NOTE  [name], [name], MD [address] [place] [address]  DIAGNOSIS:  1) monoclonal gammopathy of undetermined significance.  2) iron deficiency anemia.  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 Berneice Heinrich.   PRIOR THERAPY: Feraheme infusion x 2 doses, last dose was given in October of 2014.   CURRENT THERAPY: 1) Folivane-Plus 1 capsule p.o. daily.    INTERVAL HISTORY: Leonard [name]. 78 y.o. male returns to the clinic today for a follow-up visit. The patient was last seen 6 months ago by [name] and myself. The patient is followed for his history of MGUS and iron deficiency anemia.  He is compliant with his Folivane plus capsule daily. He also takes ferrous sulfate. He states he is compliant with this. He has not required any IV iron infusion since 2014.   In the interval since being seen, the patient denies any significant changes in his health.  He states he overall is feeling "great".   He denies any usual bone pain.  He denies any fractures.  The patient denies any recent fever, chills, or night sweats.  Denies any lymphadenopathy.  Denies any unusual signs and symptoms of infection.  Denies any abnormal bleeding or bruising.  Denies any nausea, vomiting, diarrhea, constipation, abdominal fullness, or bloating.  He is here today to review his repeat myeloma panel and iron studies.   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 09/05/2006   EGD   CKD (chronic kidney disease)    Coronary artery disease    Diverticulosis of colon (without mention of hemorrhage) 09/05/2006   Colonoscopy    Gastric polyp 2014   done at [facility]   GERD (gastroesophageal reflux disease) 07/26/2001   EGD(Chronic)   Gouty  arthropathy    Hemorrhoids    History of hiatal hernia    History of kidney stones    Hx of colonic polyps 09/05/2006   Colonoscopy(ADENOMATOUS POLYP)   Iron deficiency anemia, unspecified    Monoclonal gammopathy    OSA (obstructive sleep apnea)    USES CPAP MACHINE    Other and unspecified hyperlipidemia    Prostate cancer (HCC) 2015   Type II or unspecified type diabetes mellitus without mention of complication, not stated as uncontrolled    Unspecified essential hypertension     ALLERGIES:  is allergic to dicloxacillin, cayenne pepper [cayenne], and nsaids.  MEDICATIONS:  Current Outpatient Medications  Medication Sig Dispense Refill   allopurinol (ZYLOPRIM) 300 MG tablet TAKE 1 TABLET BY MOUTH DAILY TO PREVENT GOUT 90 tablet 3   amLODipine-benazepril (LOTREL) 10-20 MG capsule TAKE 1 CAPSULE BY MOUTH EVERY DAY 90 capsule 3   aspirin 81 MG tablet Take 81 mg by mouth daily.     atorvastatin (LIPITOR) 10 MG tablet TAKE 1 TABLET BY MOUTH EVERY DAY 90 tablet 3   baclofen (LIORESAL) 10 MG tablet Take 1 tablet (10 mg total) by mouth 3 (three) times daily. As needed for muscle spasm 50 tablet 0   esomeprazole (NEXIUM) 20 MG capsule Take 20 mg by mouth daily at 12 noon.     FeFum-FePoly-FA-B Cmp-C-Biot (IRON FOLATE PLUS) CAPS TAKE 1 CAPSULE BY MOUTH EVERY DAY IN THE MORNING 90 capsule 1   HYDROcodone bit-homatropine (HYCODAN) 5-1.5 MG/5ML syrup Take 5 mLs by mouth  every 8 (eight) hours as needed for cough. 120 mL 0   HYDROcodone-acetaminophen (NORCO) 5-325 MG tablet Take 1 tablet by mouth every 6 (six) hours as needed for moderate pain (pain score 4-6). 15 tablet 0   JANUVIA 100 MG tablet TAKE 1 TABLET BY MOUTH EVERY DAY 90 tablet 1   metFORMIN (GLUCOPHAGE) 500 MG tablet Take 1 tablet (500 mg total) by mouth 1 day or 1 dose. 90 tablet 3   metoprolol succinate (TOPROL-XL) 25 MG 24 hr tablet TAKE 1/2 TABLET BY MOUTH DAILY 45 tablet 3   nitroGLYCERIN (NITROSTAT) 0.4 MG SL tablet Place 1  tablet (0.4 mg total) under the tongue every 5 (five) minutes as needed for chest pain. Reported on 03/04/2016 30 tablet 5   ondansetron (ZOFRAN) 4 MG tablet Take 1 tablet (4 mg total) by mouth every 8 (eight) hours as needed for nausea or vomiting. 10 tablet 0   OZEMPIC, 0.25 OR 0.5 MG/DOSE, 2 MG/3ML SOPN Inject 0.5 mg into the skin once a week.     sennosides-docusate sodium (SENOKOT-S) 8.6-50 MG tablet Take 2 tablets by mouth daily. 30 tablet 1   triamterene-hydrochlorothiazide (MAXZIDE-25) 37.5-25 MG tablet TAKE 1 TABLET BY MOUTH EVERY DAY 90 tablet 3   No current facility-administered medications for this visit.    SURGICAL HISTORY:  Past Surgical History:  Procedure Laterality Date   COLONOSCOPY     ESOPHAGOGASTRODUODENOSCOPY     Done at [facility]   ESOPHAGOGASTRODUODENOSCOPY (EGD) WITH PROPOFOL N/A 11/04/2017   Procedure: ESOPHAGOGASTRODUODENOSCOPY (EGD) WITH PROPOFOL;  Surgeon: Beverley Fiedler, MD;  Location: [facility];  Service: Gastroenterology;  Laterality: N/A;   GI PROSTATE BIOPSY     HEMORRHOID SURGERY     INCISIONAL HERNIA REPAIR N/A 08/07/2015   Procedure: LAPAROSCOPIC INCISIONAL HERNIA repair with mesh;  Surgeon: De Blanch Kinsinger, MD;  Location: [facility];  Service: General;  Laterality: N/A;   KNEE SURGERY  1999   TUMOR REMOVED LEFT KNEE & ARTHROSCOPIC SURGERY   LYMPHADENECTOMY Bilateral 05/01/2014   Procedure: LYMPHADENECTOMY;  Surgeon: Sebastian Ache, MD;  Location: [facility];  Service: Urology;  Laterality: Bilateral;   MASS EXCISION Right 08/28/2020   Procedure: EXCISION RIGHT SHOULDER MASS;  Surgeon: Almond Lint, MD;  Location: [facility];  Service: General;  Laterality: Right;   PARTIAL KNEE ARTHROPLASTY Left 06/28/2023   Procedure: LEFT UNICOMPARTMENTAL KNEE;  Surgeon: Teryl Lucy, MD;  Location: [facility];  Service: Orthopedics;  Laterality: Left;   ROBOT ASSISTED LAPAROSCOPIC RADICAL PROSTATECTOMY N/A 05/01/2014   Procedure: ROBOTIC ASSISTED LAPAROSCOPIC RADICAL  PROSTATECTOMY WITH INDOCYANINE GREEN DYE;  Surgeon: Sebastian Ache, MD;  Location: [facility];  Service: Urology;  Laterality: N/A;   UPPER GASTROINTESTINAL ENDOSCOPY      REVIEW OF SYSTEMS:   Constitutional: Negative for appetite change, chills, fatigue, fever and unexpected weight change.  HENT: Negative for mouth sores, nosebleeds, sore throat and trouble swallowing.   Eyes: Negative for eye problems and icterus.  Respiratory: Negative for cough, hemoptysis, shortness of breath and wheezing.   Cardiovascular: Negative for chest pain and leg swelling.  Gastrointestinal: Negative for abdominal pain, constipation, diarrhea, nausea and vomiting.  Genitourinary: Negative for bladder incontinence, difficulty urinating, dysuria, frequency and hematuria.   Musculoskeletal: Negative for back pain, gait problem, neck pain and neck stiffness.  Skin: Negative for itching and rash.  Neurological: Negative for dizziness, extremity weakness, gait problem, headaches, light-headedness and seizures.  Hematological: Negative for adenopathy. Does not bruise/bleed easily.  Psychiatric/Behavioral: Negative for confusion, depression and sleep  disturbance. The patient is not nervous/anxious   PHYSICAL EXAMINATION:  There were no vitals taken for this visit.  ECOG PERFORMANCE STATUS: 0  Physical Exam  Constitutional: Oriented to person, place, and time and well-developed, well-nourished, and in no distress.  HENT:  Head: Normocephalic and atraumatic.  Mouth/Throat: Oropharynx is clear and moist. No oropharyngeal exudate.  Eyes: Conjunctivae are normal. Right eye exhibits no discharge. Left eye exhibits no discharge. No scleral icterus.  Neck: Normal range of motion. Neck supple.  Cardiovascular: Normal rate, regular rhythm, normal heart sounds and intact distal pulses.   Pulmonary/Chest: Effort normal and breath sounds normal. No respiratory distress. No wheezes. No rales.  Abdominal: Soft. Bowel sounds are  normal. Exhibits no distension and no mass. There is no tenderness.  Musculoskeletal: Normal range of motion. Exhibits no edema.  Lymphadenopathy:    No cervical adenopathy.  Neurological: Alert and oriented to person, place, and time. Exhibits normal muscle tone. Gait normal. Coordination normal.  Skin: Skin is warm and dry. No rash noted. Not diaphoretic. No erythema. No pallor.  Psychiatric: Mood, memory and judgment normal.  Vitals reviewed.    LABORATORY DATA: Lab Results  Component Value Date   WBC 11.0 (H) 02/16/2024   HGB 11.9 (L) 02/16/2024   HCT 34.9 (L) 02/16/2024   MCV 95.9 02/16/2024   PLT 258 02/16/2024      Chemistry      Component Value Date/Time   NA 141 12/16/2023 1016   NA 137 06/24/2021 1227   NA 140 09/14/2017 0928   K 4.5 12/16/2023 1016   K 4.0 09/14/2017 0928   CL 105 12/16/2023 1016   CL 104 04/17/2013 0857   CO2 25 12/16/2023 1016   CO2 27 09/14/2017 0928   BUN 27 (H) 12/16/2023 1016   BUN 30 (H) 06/24/2021 1227   BUN 28.3 (H) 09/14/2017 0928   CREATININE 1.91 (H) 12/16/2023 1016   CREATININE 1.4 (H) 09/14/2017 0928      Component Value Date/Time   CALCIUM 9.5 12/16/2023 1016   CALCIUM 10.1 09/14/2017 0928   ALKPHOS 71 01/25/2023 1001   ALKPHOS 80 09/14/2017 0928   AST 15 02/24/2023 0914   AST 14 (L) 01/25/2023 1001   AST 17 09/14/2017 0928   ALT 16 02/24/2023 0914   ALT 17 01/25/2023 1001   ALT 18 09/14/2017 0928   BILITOT 0.5 02/24/2023 0914   BILITOT 0.5 01/25/2023 1001   BILITOT 0.45 09/14/2017 0928       RADIOGRAPHIC STUDIES:  No results found.   ASSESSMENT/PLAN:  This is a very pleasant 78 year old African-American male with a history of iron deficiency anemia secondary to GI blood losses and MGUS.   He is currently on observation and feeling fine.   The patient had a repeat myeloma panel and CBC and iron studies.  He was seen with [name]. His labs are stable.  [name] recommends checking his MGUS/myeloma  panels annually.  Therefore we will not check this at his next follow-up in 6 months.  We will only check CBC, iron studies, and ferritin.  He will continue taking his iron supplement.  [name] explained he likely does not need to take both ferrous sulfate and his prescription iron.    [name] personally and independently reviewed his myeloma panel and recommends that he continue on observation with repeat blood work in 6 months.   Whenever he is having his repeat myeloma labs performed, we will arrange for these to be performed  1 week before his follow-up visit.  The patient was advised to call immediately if she has any concerning symptoms in the interval. The patient voices understanding of current disease status and treatment options and is in agreement with the current care plan. All questions were answered. The patient knows to call the clinic with any problems, questions or concerns. We can certainly see the patient much sooner if necessary  No orders of the defined types were placed in this encounter.   Jequan Shahin L Adelis Docter, PA-C 02/20/24  ADDENDUM: Hematology/Oncology Attending:  An face-to-face encounter with the patient today.  I reviewed his record, lab and recommended his care plan.  This is a very pleasant 79 years old African-American male with history of iron deficiency anemia and currently on iron supplement with Folivane plus capsules in addition to history of MGUS and closely on observation.  The patient presents today for evaluation and repeat blood work.  His mild Lumakras (Sotorasib) showed no concerning findings for progression.  His recent iron study and ferritin are acceptable for his current condition with slightly low serum iron of 43 and iron saturation of 13% but ferritin level is normal at 70.  I recommended for the patient to continue his iron supplement and we will see him back for follow-up visit in 6 months with repeat CBC, iron study and ferritin.   Will continue to monitor his myeloma panel on annual basis. The patient was advised to call immediately if he has any other concerning symptoms in the interval. The total time spent in the appointment was 20 minutes. Disclaimer: This note was dictated with voice recognition software. Similar sounding words can inadvertently be transcribed and may be missed upon review. Lajuana Matte, MD

## 2024-02-23 ENCOUNTER — Inpatient Hospital Stay (HOSPITAL_BASED_OUTPATIENT_CLINIC_OR_DEPARTMENT_OTHER): Payer: Medicare Other | Admitting: Physician Assistant

## 2024-02-23 VITALS — BP 147/67 | HR 89 | Temp 98.3°F | Resp 16 | Wt 277.7 lb

## 2024-02-23 DIAGNOSIS — Z8546 Personal history of malignant neoplasm of prostate: Secondary | ICD-10-CM | POA: Diagnosis not present

## 2024-02-23 DIAGNOSIS — D472 Monoclonal gammopathy: Secondary | ICD-10-CM | POA: Diagnosis not present

## 2024-02-23 DIAGNOSIS — D509 Iron deficiency anemia, unspecified: Secondary | ICD-10-CM | POA: Diagnosis not present

## 2024-02-23 DIAGNOSIS — Z79899 Other long term (current) drug therapy: Secondary | ICD-10-CM | POA: Diagnosis not present

## 2024-03-09 ENCOUNTER — Other Ambulatory Visit: Payer: Medicare Other

## 2024-03-09 DIAGNOSIS — E1169 Type 2 diabetes mellitus with other specified complication: Secondary | ICD-10-CM | POA: Diagnosis not present

## 2024-03-09 DIAGNOSIS — E785 Hyperlipidemia, unspecified: Secondary | ICD-10-CM | POA: Diagnosis not present

## 2024-03-09 NOTE — Progress Notes (Addendum)
 Patient Care Team: Sylvan Evener, MD as PCP - General (Internal Medicine) Elmyra Haggard, MD as PCP - Cardiology (Cardiology) Jock Muller, MD (Inactive) (Urology)  Visit Date: 03/13/24  Subjective:   Chief Complaint  Patient presents with   Follow-up  Patient Leonard [name] Jr.,Male DOB:06-26-1946,78 y.o. AVW:[id number]   78 y.o. Male presents today for 3 months follow-up for MGUS; Iron Deficiency Anemia. Patient has a past medical history of Prostate Adenocarcinoma.   History of Iron Deficiency Anemia treated with Folivane-Plus daily. 03/09/2024 CBC, compared to 11/2023: RBC 3.83, elevated from 3.64; Hgb 12, elevated slightly from 11.9; HCT 36.4, decreased from 34.9; RDW 15.4, elevated from 15.1. Iron Panel: Iron 38, decreased from 55; %SAT 12, decreased from 17. In the past he has received x2 Feraheme  infusions, last given in October 2014.   History of Monoclonal Gammopathy of Undetermined Significance followed by Dr. Liam Redhead, Oncologist. He was recently seen at their office on 02/23/2024 for his MGUS, Anemia, and Prostate Cancer to review his most recent myeloma panel and iron studies; will follow-up with them in 6 months around October. LDH was WNL. Beta-2  Microglobulin 2.5, down from 3.7 in 07/2023, but was no change from 01/2023. IgM 208, elevated from 187 in 07/2023. Kappa Light Chain 51.6, decreased from 94.9 in 07/2023; Lambda Light Chain 28.1, decreased from 38.6, and Kappa/Lambda Ratio 1.84, decreased from 2.46.   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 Secundino Dach.   S/p Left Unicompartmental Knee Arthroplasty 06/2023 and he says that his knee is improving still.  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 09/05/2006   EGD   CKD (chronic kidney disease)    Coronary artery disease    Diverticulosis  of colon (without mention of hemorrhage) 09/05/2006   Colonoscopy    Gastric polyp 2014   done at [facility]   GERD (gastroesophageal reflux disease) 07/26/2001   EGD(Chronic)   Gouty arthropathy    Hemorrhoids    History of hiatal hernia    History of kidney stones    Hx of colonic polyps 09/05/2006   Colonoscopy(ADENOMATOUS POLYP)   Iron deficiency anemia, unspecified    Monoclonal gammopathy    OSA (obstructive sleep apnea)    USES CPAP MACHINE    Other and unspecified hyperlipidemia    Prostate cancer (HCC) 2015   Type II or unspecified type diabetes mellitus without mention of complication, not stated as uncontrolled    Unspecified essential hypertension     Allergies  Allergen Reactions   Dicloxacillin Rash and Other (See Comments)    Has patient had a PCN reaction causing immediate rash, facial/tongue/throat swelling, SOB or lightheadedness with hypotension: No Has patient had a PCN reaction causing severe rash involving mucus membranes or skin necrosis: No Has patient had a PCN reaction that required hospitalization: No Has patient had a PCN reaction occurring within the last 10 years: No If all of the above answers are "NO", then may proceed with Cephalosporin use.    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 Non-smoker or drinker.    Review of Systems  All other systems reviewed and are negative. Says that he is doing well.    Objective:  Vitals: BP 118/78   Pulse 74   Temp 97.9 F (36.6 C) (Temporal)   Ht 5' 9.25" (1.759 m)   Wt 279 lb 12.8 oz (126.9 kg)   SpO2 (!) 89%    BMI 41.02 kg/m   Physical Exam Constitutional:      General: He is not in acute distress.    Appearance: Normal appearance. He is not ill-appearing.  HENT:     Head: Normocephalic and atraumatic.  Cardiovascular:     Rate and Rhythm: Normal rate and regular rhythm. Occasional Extrasystoles (occasional irregular contraction) are present.    Pulses: Normal pulses.     Heart sounds: Normal heart sounds. No murmur heard.    No friction rub. No gallop.  Pulmonary:     Effort: Pulmonary effort is normal. No respiratory distress.     Breath sounds: Normal breath sounds. No wheezing or rales.  Skin:    General: Skin is warm and dry.  Neurological:     Mental Status: He is alert and oriented to person, place, and time. Mental status is at baseline.  Psychiatric:        Mood and Affect: Mood normal.        Behavior: Behavior normal.        Thought Content: Thought content normal.        Judgment: Judgment normal.    Results:  Studies Obtained And Personally Reviewed By Me: Labs:     Component Value Date/Time   NA 141 12/16/2023 1016   NA 137 06/24/2021 1227   NA 140 09/14/2017 0928   K 4.5 12/16/2023 1016   K 4.0 09/14/2017 0928   CL 105 12/16/2023 1016   CL 104 04/17/2013 0857   CO2 25 12/16/2023 1016   CO2 27 09/14/2017 0928   GLUCOSE 95 12/16/2023 1016   GLUCOSE 113 09/14/2017 0928   GLUCOSE 105 (H) 04/17/2013 0857   BUN 27 (H) 12/16/2023 1016   BUN 30 (H) 06/24/2021 1227   BUN 28.3 (H) 09/14/2017 0928   CREATININE 1.91 (H) 12/16/2023 1016   CREATININE 1.4 (H) 09/14/2017 0928   CALCIUM  9.5 12/16/2023 1016   CALCIUM  10.1 09/14/2017 0928   PROT 6.6 02/24/2023 0914   PROT 8.0 09/14/2017 0928   ALBUMIN 3.8 01/25/2023 1001   ALBUMIN 3.6 09/14/2017 0928   AST 15 02/24/2023 0914   AST 14 (L) 01/25/2023 1001   AST 17 09/14/2017 0928   ALT 16 02/24/2023 0914   ALT 17 01/25/2023 1001   ALT 18 09/14/2017 0928   ALKPHOS 71 01/25/2023 1001   ALKPHOS 80 09/14/2017 0928    BILITOT 0.5 02/24/2023 0914   BILITOT 0.5 01/25/2023 1001   BILITOT 0.45 09/14/2017 0928   GFRNONAA 52 (L) 06/15/2023 1339   GFRNONAA >60 01/25/2023 1001   GFRNONAA 50 (L) 07/08/2020 0907   GFRAA 58 (L) 07/16/2020 1043   GFRAA 58 (L) 07/08/2020 0907    Lab Results  Component Value Date   WBC 10.4 03/09/2024   HGB 12.0 (L) 03/09/2024   HCT 36.4 (L) 03/09/2024   MCV 95.0 03/09/2024   PLT 293 03/09/2024  Lab Results  Component Value Date   CHOL 150 12/16/2023   HDL 85 12/16/2023   LDLCALC 50 12/16/2023   TRIG 66 12/16/2023   CHOLHDL 1.8 12/16/2023   Lab Results  Component Value Date   HGBA1C 6.2 (H) 06/15/2023    Lab Results  Component Value Date   TSH 1.32 02/06/2021    Lab Results  Component Value Date   PSA <0.04 08/19/2022   PSA <0.04 07/21/2021   PSA <0.1 07/08/2020     Assessment & Plan:  Iron Deficiency Anemia treated with Folivane-Plus daily. 03/09/2024 CBC, compared to 11/2023: RBC 3.83, elevated from 3.64; Hgb 12, elevated slightly from 11.9; HCT 36.4, decreased from 34.9; RDW 15.4, elevated from 15.1. Iron Panel: Iron 38, decreased from 55; %SAT 12, decreased from 17.   Monoclonal Gammopathy of Undetermined Significance followed by Dr. Liam Redhead, Oncologist. He was recently seen at their office on 02/23/2024 for his MGUS, Anemia, and Prostate Cancer to review his most recent myeloma panel and iron studies; will follow-up with them in 6 months around October. LDH was WNL. Beta-2  Microglobulin 2.5, down from 3.7 in 07/2023, but was no change from 01/2023. IgM 208, elevated from 187 in 07/2023. Kappa Light Chain 51.6, decreased from 94.9 in 07/2023; Lambda Light Chain 28.1, decreased from 38.6, and Kappa/Lambda Ratio 1.84, decreased from 2.46.   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 Secundino Dach.   S/p Left Unicompartmental Knee  Arthroplasty 06/2023 and he says that his knee is improving still.     I,Emily Lagle,acting as a Neurosurgeon for Sylvan Evener, MD.,have documented all relevant documentation on the behalf of Sylvan Evener, MD,as directed by  Sylvan Evener, MD while in the presence of Sylvan Evener, MD.   I, Sylvan Evener, MD, have reviewed all documentation for this visit. The documentation on 03/13/24 for the exam, diagnosis, procedures, and orders are all accurate and complete.

## 2024-03-10 LAB — CBC WITH DIFFERENTIAL/PLATELET
Absolute Lymphocytes: 3723 {cells}/uL (ref 850–3900)
Absolute Monocytes: 666 {cells}/uL (ref 200–950)
Basophils Absolute: 31 {cells}/uL (ref 0–200)
Basophils Relative: 0.3 %
Eosinophils Absolute: 156 {cells}/uL (ref 15–500)
Eosinophils Relative: 1.5 %
HCT: 36.4 % — ABNORMAL LOW (ref 38.5–50.0)
Hemoglobin: 12 g/dL — ABNORMAL LOW (ref 13.2–17.1)
MCH: 31.3 pg (ref 27.0–33.0)
MCHC: 33 g/dL (ref 32.0–36.0)
MCV: 95 fL (ref 80.0–100.0)
MPV: 10.1 fL (ref 7.5–12.5)
Monocytes Relative: 6.4 %
Neutro Abs: 5824 {cells}/uL (ref 1500–7800)
Neutrophils Relative %: 56 %
Platelets: 293 10*3/uL (ref 140–400)
RBC: 3.83 10*6/uL — ABNORMAL LOW (ref 4.20–5.80)
RDW: 15.4 % — ABNORMAL HIGH (ref 11.0–15.0)
Total Lymphocyte: 35.8 %
WBC: 10.4 10*3/uL (ref 3.8–10.8)

## 2024-03-10 LAB — IRON,TIBC AND FERRITIN PANEL
%SAT: 12 % — ABNORMAL LOW (ref 20–48)
Ferritin: 63 ng/mL (ref 24–380)
Iron: 38 ug/dL — ABNORMAL LOW (ref 50–180)
TIBC: 322 ug/dL (ref 250–425)

## 2024-03-13 ENCOUNTER — Ambulatory Visit: Payer: Medicare Other | Admitting: Internal Medicine

## 2024-03-13 ENCOUNTER — Encounter: Payer: Self-pay | Admitting: Internal Medicine

## 2024-03-13 VITALS — BP 118/78 | HR 74 | Temp 97.9°F | Ht 69.25 in | Wt 279.8 lb

## 2024-03-13 DIAGNOSIS — Z9079 Acquired absence of other genital organ(s): Secondary | ICD-10-CM | POA: Diagnosis not present

## 2024-03-13 DIAGNOSIS — I10 Essential (primary) hypertension: Secondary | ICD-10-CM

## 2024-03-13 DIAGNOSIS — Z8546 Personal history of malignant neoplasm of prostate: Secondary | ICD-10-CM

## 2024-03-13 DIAGNOSIS — Z8639 Personal history of other endocrine, nutritional and metabolic disease: Secondary | ICD-10-CM

## 2024-03-13 DIAGNOSIS — Z6841 Body Mass Index (BMI) 40.0 and over, adult: Secondary | ICD-10-CM

## 2024-03-13 DIAGNOSIS — Z96652 Presence of left artificial knee joint: Secondary | ICD-10-CM

## 2024-03-13 DIAGNOSIS — I2510 Atherosclerotic heart disease of native coronary artery without angina pectoris: Secondary | ICD-10-CM

## 2024-03-13 DIAGNOSIS — D472 Monoclonal gammopathy: Secondary | ICD-10-CM | POA: Diagnosis not present

## 2024-03-13 DIAGNOSIS — D509 Iron deficiency anemia, unspecified: Secondary | ICD-10-CM | POA: Diagnosis not present

## 2024-03-13 DIAGNOSIS — Z860101 Personal history of adenomatous and serrated colon polyps: Secondary | ICD-10-CM

## 2024-03-13 DIAGNOSIS — E119 Type 2 diabetes mellitus without complications: Secondary | ICD-10-CM

## 2024-03-13 DIAGNOSIS — Z8739 Personal history of other diseases of the musculoskeletal system and connective tissue: Secondary | ICD-10-CM | POA: Diagnosis not present

## 2024-03-13 DIAGNOSIS — E611 Iron deficiency: Secondary | ICD-10-CM

## 2024-03-13 DIAGNOSIS — N183 Chronic kidney disease, stage 3 unspecified: Secondary | ICD-10-CM

## 2024-03-13 DIAGNOSIS — E1169 Type 2 diabetes mellitus with other specified complication: Secondary | ICD-10-CM

## 2024-03-13 DIAGNOSIS — N1832 Chronic kidney disease, stage 3b: Secondary | ICD-10-CM

## 2024-03-13 NOTE — Patient Instructions (Signed)
 Labs are stable. Continue iron supplement. Encourage exercise. Watch diet. Return in 6 months.

## 2024-03-23 ENCOUNTER — Other Ambulatory Visit: Payer: Self-pay | Admitting: Internal Medicine

## 2024-04-17 DIAGNOSIS — E119 Type 2 diabetes mellitus without complications: Secondary | ICD-10-CM | POA: Diagnosis not present

## 2024-04-17 DIAGNOSIS — H2513 Age-related nuclear cataract, bilateral: Secondary | ICD-10-CM | POA: Diagnosis not present

## 2024-04-17 LAB — OPHTHALMOLOGY REPORT-SCANNED

## 2024-05-15 ENCOUNTER — Other Ambulatory Visit: Payer: Self-pay | Admitting: Internal Medicine

## 2024-06-04 DIAGNOSIS — M1712 Unilateral primary osteoarthritis, left knee: Secondary | ICD-10-CM | POA: Diagnosis not present

## 2024-06-25 ENCOUNTER — Encounter: Payer: Self-pay | Admitting: Internal Medicine

## 2024-06-25 ENCOUNTER — Ambulatory Visit: Admitting: Internal Medicine

## 2024-06-25 ENCOUNTER — Ambulatory Visit: Payer: Self-pay

## 2024-06-25 VITALS — BP 110/60 | HR 83 | Ht 69.0 in | Wt 287.0 lb

## 2024-06-25 DIAGNOSIS — E1169 Type 2 diabetes mellitus with other specified complication: Secondary | ICD-10-CM

## 2024-06-25 DIAGNOSIS — I2510 Atherosclerotic heart disease of native coronary artery without angina pectoris: Secondary | ICD-10-CM | POA: Diagnosis not present

## 2024-06-25 DIAGNOSIS — R600 Localized edema: Secondary | ICD-10-CM

## 2024-06-25 DIAGNOSIS — N1832 Chronic kidney disease, stage 3b: Secondary | ICD-10-CM

## 2024-06-25 DIAGNOSIS — Z8546 Personal history of malignant neoplasm of prostate: Secondary | ICD-10-CM

## 2024-06-25 DIAGNOSIS — I10 Essential (primary) hypertension: Secondary | ICD-10-CM | POA: Diagnosis not present

## 2024-06-25 NOTE — Progress Notes (Signed)
 Patient Care Team: [name], MD as PCP - General (Internal Medicine) Okey Vina GAILS, MD as PCP - Cardiology (Cardiology) Aleene Kitchens, MD (Inactive) (Urology)  Visit Date: 06/25/24  Subjective:   Chief Complaint  Patient presents with  . Leg Swelling   [name],Male DOB:07/27/1946,78 y.o. FMW:[id number]   78 y.o.Male presents today for acute visit with Moderate Bilateral Ankle Swelling. Patient has a past medical history of CKD; CAD; HTN; SVT/PAC. Says that his wife was concerned because she's noticed he's had some increased swelling in his ankles. Taking Triamterene -HCTZ 18.75 - 12.5 mg daily per Dr. Okey, Cardiologist.  Past Medical History:  Diagnosis Date  . 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 CKD (chronic kidney disease)   . Coronary artery disease   . Diverticulosis of colon (without mention of hemorrhage) 09/05/2006   Colonoscopy   . Gastric polyp 2014   done at [facility]  . GERD (gastroesophageal reflux disease) 07/26/2001   EGD(Chronic)  . Gouty arthropathy   . Hemorrhoids   . History of hiatal hernia   . History of kidney stones   . Hx of colonic polyps 09/05/2006   Colonoscopy(ADENOMATOUS POLYP)  . Iron deficiency anemia, unspecified   . Monoclonal gammopathy   . OSA (obstructive sleep apnea)    USES CPAP MACHINE   . Other and unspecified hyperlipidemia   . Prostate cancer (HCC) 2015  . Type II or unspecified type diabetes mellitus without mention of complication, not stated as uncontrolled   . Unspecified essential hypertension     Allergies  Allergen Reactions  . Dicloxacillin Rash and Other (See Comments)    Has patient had a PCN reaction causing immediate rash, facial/tongue/throat swelling, SOB or lightheadedness with hypotension: No Has patient had a PCN reaction causing severe rash involving mucus membranes or skin necrosis: No Has patient had a PCN reaction that required hospitalization: No Has  patient had a PCN reaction occurring within the last 10 years: No If all of the above answers are NO, then may proceed with Cephalosporin use.   SABRA Binning Pepper [Cayenne] Swelling  . Nsaids Other (See Comments)    PT declines Acute renal failure 08/09/2015   Immunization History  Administered Date(s) Administered  . Fluad Quad(high Dose 65+) 08/09/2022  . Influenza Split 08/15/2013  . Influenza Whole 07/17/2011, 09/15/2012  . Influenza, Seasonal, Injecte, Preservative Fre 09/15/2023  . Influenza,inj,Quad PF,6+ Mos 07/17/2015, 07/13/2016, 08/12/2017, 08/08/2018, 07/13/2019, 07/15/2020, 08/10/2021  . Influenza-Unspecified 09/05/2014  . Moderna SARS-COV2 Booster Vaccination 08/25/2022  . PFIZER Comirnaty(Gray Top)Covid-19 Tri-Sucrose Vaccine 03/09/2021  . PFIZER(Purple Top)SARS-COV-2 Vaccination 12/22/2019, 01/12/2020, 07/15/2020  . PNEUMOCOCCAL CONJUGATE-20 03/25/2022  . Pfizer Covid-19 Vaccine Bivalent Booster 14yrs & up 09/01/2021, 03/25/2022  . Pneumococcal Conjugate-13 12/20/2014  . Pneumococcal Polysaccharide-23 05/12/2006, 12/14/2013  . Respiratory Syncytial Virus Vaccine,Recomb Aduvanted(Arexvy) 11/04/2022  . Tdap 05/01/2010, 04/05/2022  . Unspecified SARS-COV-2 Vaccination 08/25/2022  . Zoster Recombinant(Shingrix) 09/04/2018, 01/03/2019   Past Surgical History:  Procedure Laterality Date  . COLONOSCOPY    . ESOPHAGOGASTRODUODENOSCOPY     [facility] at [facility]  . ESOPHAGOGASTRODUODENOSCOPY (EGD) WITH PROPOFOL  N/A 11/04/2017   Procedure: ESOPHAGOGASTRODUODENOSCOPY (EGD) WITH PROPOFOL ;  Surgeon: Albertus Gordy HERO, MD;  Location: [facility];  Service: Gastroenterology;  Laterality: N/A;  . GI PROSTATE BIOPSY    . HEMORRHOID SURGERY    . INCISIONAL HERNIA REPAIR N/A 08/07/2015   Procedure: LAPAROSCOPIC INCISIONAL HERNIA repair with mesh;  Surgeon: Herlene Beverley Bureau, MD;  Location:  [facility];  Service: General;  Laterality: N/A;  . KNEE SURGERY  1999   TUMOR REMOVED LEFT KNEE &  ARTHROSCOPIC SURGERY  . LYMPHADENECTOMY Bilateral 05/01/2014   Procedure: LYMPHADENECTOMY;  Surgeon: Ricardo Likens, MD;  Location: [facility];  Service: Urology;  Laterality: Bilateral;  . MASS EXCISION Right 08/28/2020   Procedure: EXCISION RIGHT SHOULDER MASS;  Surgeon: Aron Shoulders, MD;  Location: [facility];  Service: General;  Laterality: Right;  . PARTIAL KNEE ARTHROPLASTY Left 06/28/2023   Procedure: LEFT UNICOMPARTMENTAL KNEE;  Surgeon: Josefina Chew, MD;  Location: [facility];  Service: Orthopedics;  Laterality: Left;  . ROBOT ASSISTED LAPAROSCOPIC RADICAL PROSTATECTOMY N/A 05/01/2014   Procedure: ROBOTIC ASSISTED LAPAROSCOPIC RADICAL PROSTATECTOMY WITH INDOCYANINE GREEN  DYE;  Surgeon: Ricardo Likens, MD;  Location: [facility];  Service: Urology;  Laterality: N/A;  . UPPER GASTROINTESTINAL ENDOSCOPY      Family History  Problem Relation Age of Onset  . Heart attack Father   . Diabetes Mother   . Diabetes Brother   . Colon cancer Neg Hx   . Stomach cancer Neg Hx   . Esophageal cancer Neg Hx   . Rectal cancer Neg Hx   . 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 Married. Non-smoker or drinker.    Review of Systems  Constitutional:  Negative for fever and malaise/fatigue.  HENT:  Negative for congestion.   Eyes:  Negative for blurred vision.  Respiratory:  Negative for cough and shortness of breath.   Cardiovascular:  Positive for leg swelling. Negative for chest pain and palpitations.  Gastrointestinal:  Negative for vomiting.  Musculoskeletal:  Negative for back pain.  Skin:  Negative for rash.  Neurological:  Negative for loss of consciousness and headaches.     Objective:  Vitals: BP 110/60   Pulse 83   Ht 5' 9 (1.753 m)   Wt 287 lb (130.2 kg)   SpO2 96%   BMI 42.38 kg/m   Physical Exam Vitals and nursing note reviewed.  Constitutional:      General: He is not in acute distress.    Appearance:  Normal appearance. He is not ill-appearing.  HENT:     Head: Normocephalic and atraumatic.  Cardiovascular:     Rate and Rhythm: Normal rate. Rhythm irregular.     Pulses: Normal pulses.     Heart sounds: Normal heart sounds. No murmur heard.    No friction rub. No gallop.  Pulmonary:     Effort: Pulmonary effort is normal. No respiratory distress.     Breath sounds: Normal breath sounds. No wheezing or rales.  Musculoskeletal:     Right lower leg: 1+ Pitting Edema present.     Left lower leg: 1+ Pitting Edema present.  Skin:    General: Skin is warm and dry.  Neurological:     Mental Status: He is alert and oriented to person, place, and time. Mental status is at baseline.  Psychiatric:        Mood and Affect: Mood normal.        Behavior: Behavior normal.        Thought Content: Thought content normal.        Judgment: Judgment normal.     Results:  Studies Obtained And Personally Reviewed By Me:  EKG 06/25/2024 showing sinus rhythm w/ PACs  Labs:  CBC w/ Differential Lab Results  Component Value Date  WBC 10.4 03/09/2024   RBC 3.83 (L) 03/09/2024   HGB 12.0 (L) 03/09/2024   HCT 36.4 (L) 03/09/2024   PLT 293 03/09/2024   MCV 95.0 03/09/2024   MCH 31.3 03/09/2024   MCHC 33.0 03/09/2024   RDW 15.4 (H) 03/09/2024   MPV 10.1 03/09/2024   LYMPHSABS 4.2 (H) 02/16/2024   MONOABS 0.9 02/16/2024   BASOSABS 31 03/09/2024    Comprehensive Metabolic Panel Lab Results  Component Value Date   NA 141 12/16/2023   K 4.5 12/16/2023   CL 105 12/16/2023   CO2 25 12/16/2023   GLUCOSE 95 12/16/2023   BUN 27 (H) 12/16/2023   CREATININE 1.91 (H) 12/16/2023   CALCIUM  9.5 12/16/2023   PROT 6.6 02/24/2023   ALBUMIN 3.8 01/25/2023   AST 15 02/24/2023   ALT 16 02/24/2023   ALKPHOS 71 01/25/2023   BILITOT 0.5 02/24/2023   GFR 79.76 07/23/2015   EGFR 52.0 06/15/2023   GFRNONAA 52 (L) 06/15/2023   Lipid Panel  Lab Results  Component Value Date   CHOL 150 12/16/2023   HDL  85 12/16/2023   LDLCALC 50 12/16/2023   TRIG 66 12/16/2023   A1c Lab Results  Component Value Date   HGBA1C 6.2 (H) 06/15/2023    TSH Lab Results  Component Value Date   TSH 1.32 02/06/2021   PSA Lab Results  Component Value Date   PSA <0.04 08/19/2022   PSA <0.04 07/21/2021   PSA <0.1 07/08/2020     Assessment & Plan:   Orders Placed This Encounter  Procedures  . CBC with Differential/Platelet  . Basic metabolic panel with GFR  . Brain natriuretic peptide  . EKG 12-Lead   Leg Edema: Hx of CAD; HTN; SVT/PACs. Reportedly his wife was concerned because she's noticed he's had some increased swelling in his ankles, which is why he made an appointment. He takes Triamterene -HCTZ 18.75 - 12.5 mg daily per Dr. Okey, Cardiologist.  EKG 06/25/2024 showing sinus rhythm w/ PACs, done as his rhythm was irregular on exam. Ordering CBC, B-MET, and BNP. Instructed him to increase Triamterene -HCTZ to 37.5 - 25 mg daily.Call if not improving in a few days. Keep legs elevated. EKG today has Q waves in III and F which were not present on EKG 12/14/23.    I,Emily Lagle,acting as a Neurosurgeon for Ronal JINNY Hailstone, MD.,have documented all relevant documentation on the behalf of Ronal JINNY Hailstone, MD,as directed by  Ronal JINNY Hailstone, MD while in the presence of Ronal JINNY Hailstone, MD.  I, Ronal JINNY Hailstone, MD, have reviewed all documentation for this visit. The documentation on 06/25/2024 for the exam, diagnosis, procedures, and orders are all accurate and complete.

## 2024-06-25 NOTE — Patient Instructions (Addendum)
 Labs drawn and pending. Take  Maxzide 25 daily (whole tablet) Drink plenty of water . Keep legs elevated when sitting. Walk some for exercise which should help edema.Call if not better in 3-5 days or sooner if worse.

## 2024-06-25 NOTE — Telephone Encounter (Signed)
 FYI Only or Action Required?: FYI only for provider.  Patient was last seen in primary care on 03/13/2024 by [name], MD.  Called Nurse Triage reporting Joint Swelling.  Symptoms began several weeks ago.  Interventions attempted: Rest, hydration, or home remedies.  Symptoms are: unchanged.  Triage Disposition: See PCP When Office is Open (Within 3 Days)  Patient/caregiver understands and will follow disposition?: Yes  **Appt. Scheduled for 8/11**         Copied from CRM #[id number]. Topic: Clinical - Red Word Triage >> Jun 25, 2024 10:29 AM Rosaria BRAVO wrote: Red Word that prompted transfer to Nurse Triage: Swollen ankles, pt is very concerned Reason for Disposition  MODERATE ankle swelling (e.g., interferes with normal activities, can't move joint normally) (Exceptions: Itchy, localized swelling; swelling is chronic.)  Answer Assessment - Initial Assessment Questions 1. LOCATION: Which ankle is swollen? Where is the swelling?     BIL   2. ONSET: When did the swelling start?     X 3 weeks   3. SWELLING: How bad is the swelling? Or, How large is it? (e.g., mild, moderate, severe; size of localized swelling)      Moderate swelling   4. PAIN: Is there any pain? If Yes, ask: How bad is it? (Scale 0-10; or none, mild, moderate, severe)     No pain, no redness, no signs of infection   5. CAUSE: What do you think caused the ankle swelling?     Unknown   6. OTHER SYMPTOMS: Do you have any other symptoms? (e.g., fever, chest pain, difficulty breathing, calf pain)  No  Protocols used: Ankle Swelling-A-AH

## 2024-06-26 ENCOUNTER — Ambulatory Visit: Payer: Self-pay | Admitting: Internal Medicine

## 2024-06-26 ENCOUNTER — Encounter: Payer: Self-pay | Admitting: Internal Medicine

## 2024-06-26 LAB — BASIC METABOLIC PANEL WITH GFR
BUN/Creatinine Ratio: 15 (calc) (ref 6–22)
BUN: 22 mg/dL (ref 7–25)
CO2: 20 mmol/L (ref 20–32)
Calcium: 9.6 mg/dL (ref 8.6–10.3)
Chloride: 106 mmol/L (ref 98–110)
Creat: 1.43 mg/dL — ABNORMAL HIGH (ref 0.70–1.28)
Glucose, Bld: 96 mg/dL (ref 65–99)
Potassium: 4 mmol/L (ref 3.5–5.3)
Sodium: 138 mmol/L (ref 135–146)
eGFR: 51 mL/min/1.73m2 — ABNORMAL LOW (ref >=60)

## 2024-06-26 LAB — CBC WITH DIFFERENTIAL/PLATELET
Absolute Lymphocytes: 3881 {cells}/uL (ref 850–3900)
Absolute Monocytes: 992 {cells}/uL — ABNORMAL HIGH (ref 200–950)
Basophils Absolute: 25 {cells}/uL (ref 0–200)
Basophils Relative: 0.2 %
Eosinophils Absolute: 124 {cells}/uL (ref 15–500)
Eosinophils Relative: 1 %
HCT: 37 % — ABNORMAL LOW (ref 38.5–50.0)
Hemoglobin: 12.2 g/dL — ABNORMAL LOW (ref 13.2–17.1)
MCH: 31.5 pg (ref 27.0–33.0)
MCHC: 33 g/dL (ref 32.0–36.0)
MCV: 95.6 fL (ref 80.0–100.0)
MPV: 10 fL (ref 7.5–12.5)
Monocytes Relative: 8 %
Neutro Abs: 7378 {cells}/uL (ref 1500–7800)
Neutrophils Relative %: 59.5 %
Platelets: 310 Thousand/uL (ref 140–400)
RBC: 3.87 Million/uL — ABNORMAL LOW (ref 4.20–5.80)
RDW: 14.5 % (ref 11.0–15.0)
Total Lymphocyte: 31.3 %
WBC: 12.4 Thousand/uL — ABNORMAL HIGH (ref 3.8–10.8)

## 2024-06-26 LAB — BRAIN NATRIURETIC PEPTIDE: Brain Natriuretic Peptide: 8 pg/mL (ref <100)

## 2024-07-05 ENCOUNTER — Encounter: Payer: Self-pay | Admitting: Internal Medicine

## 2024-07-05 ENCOUNTER — Ambulatory Visit: Admitting: Internal Medicine

## 2024-07-05 VITALS — BP 110/70 | HR 82 | Temp 97.9°F | Ht 69.0 in | Wt 286.0 lb

## 2024-07-05 DIAGNOSIS — E1122 Type 2 diabetes mellitus with diabetic chronic kidney disease: Secondary | ICD-10-CM | POA: Diagnosis not present

## 2024-07-05 DIAGNOSIS — T781XXA Other adverse food reactions, not elsewhere classified, initial encounter: Secondary | ICD-10-CM

## 2024-07-05 DIAGNOSIS — N1832 Chronic kidney disease, stage 3b: Secondary | ICD-10-CM | POA: Diagnosis not present

## 2024-07-05 DIAGNOSIS — R600 Localized edema: Secondary | ICD-10-CM

## 2024-07-05 DIAGNOSIS — E119 Type 2 diabetes mellitus without complications: Secondary | ICD-10-CM | POA: Diagnosis not present

## 2024-07-05 DIAGNOSIS — I10 Essential (primary) hypertension: Secondary | ICD-10-CM | POA: Diagnosis not present

## 2024-07-05 DIAGNOSIS — Z96652 Presence of left artificial knee joint: Secondary | ICD-10-CM

## 2024-07-05 DIAGNOSIS — Z8739 Personal history of other diseases of the musculoskeletal system and connective tissue: Secondary | ICD-10-CM

## 2024-07-05 MED ORDER — EPINEPHRINE 0.3 MG/0.3ML IJ SOAJ: 0.3000 mg | INTRAMUSCULAR | 2 refills | Status: AC | PRN

## 2024-07-05 NOTE — Progress Notes (Addendum)
 Patient Care Team: [name], MD as PCP - General (Internal Medicine) Okey Vina GAILS, MD as PCP - Cardiology (Cardiology) Aleene Kitchens, MD (Inactive) (Urology)  Visit Date: 07/05/24  Subjective:   Chief Complaint  Patient presents with   Joint Swelling   Patient PI:Leonard LEBERT LOVERN Jr.,Male DOB:Nov 20, 1945,78 y.o. FMW:[id number]   78 y.o.Male presents today for 10 day follow-up on Moderate Bilateral Ankle Swelling, for which they were seen initially on 06/25/2024. Patient has a past medical history of CKD; CAD; HTN; SVT/PAC. At that visit he said he was taking Triamterene -HCTZ 18.75 - 12.5 mg daily per Dr. Okey, Cardiologist, so it was recommended that he take 1 whole tablet for a dosage of 37.5 - 25 mg. 06/25/2024 BNP WNL at 8. Today, his edema has significantly improved and blood pressure is WNL at 110/70.  Is allergic to [employer], which he mentions today as he's had 2 allergic reactions within the past two months due to unintentional encounters. After consuming allergen he says that his lips swell, but has never had his throat swell shut to the point he could not breathe. In the past, has seen Dr. Asa, Allergist.  History of Diabetes Mellitus, type II treated with Metformin  500 mg daily.  06/15/2023 HgbA1c 6.2. Eye exam reportedly done at [facility], which has been requested for documentation. Since January, when he weighed 275 lbs, he has gained 11 pounds and today weighs 286 lbsBMI 42.23.  Vaccine Counseling: discussed Covid-19 and Influenza, which he plans on updating this fall. 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 09/05/2006   EGD   CKD (chronic kidney disease)    Coronary artery disease    Diverticulosis of colon (without mention of hemorrhage) 09/05/2006   Colonoscopy    Gastric polyp 2014   done at [facility]   GERD (gastroesophageal reflux disease) 07/26/2001   EGD(Chronic)   Gouty arthropathy     Hemorrhoids    History of hiatal hernia    History of kidney stones    Hx of colonic polyps 09/05/2006   Colonoscopy(ADENOMATOUS POLYP)   Iron deficiency anemia, unspecified    Monoclonal gammopathy    OSA (obstructive sleep apnea)    USES CPAP MACHINE    Other and unspecified hyperlipidemia    Prostate cancer (HCC) 2015   Type II or unspecified type diabetes mellitus without mention of complication, not stated as uncontrolled    Unspecified essential hypertension     Allergies  Allergen Reactions   Dicloxacillin Rash and Other (See Comments)    Has patient had a PCN reaction causing immediate rash, facial/tongue/throat swelling, SOB or lightheadedness with hypotension: No Has patient had a PCN reaction causing severe rash involving mucus membranes or skin necrosis: No Has patient had a PCN reaction that required hospitalization: No Has patient had a PCN reaction occurring within the last 10 years: No If all of the above answers are NO, then may proceed with Cephalosporin use.    Cayenne Pepper [Cayenne] Swelling   Nsaids Other (See Comments)    PT declines Acute renal failure 08/09/2015   Immunization History  Administered Date(s) Administered   Fluad Quad(high Dose 65+) 08/09/2022   Influenza Split 08/15/2013   Influenza Whole 07/17/2011, 09/15/2012   Influenza, Seasonal, Injecte, Preservative Fre 09/15/2023   Influenza,inj,Quad PF,6+ Mos 07/17/2015, 07/13/2016, 08/12/2017, 08/08/2018, 07/13/2019, 07/15/2020, 08/10/2021   Influenza-Unspecified 09/05/2014   Moderna SARS-COV2 Booster Vaccination 08/25/2022   PFIZER Comirnaty(Gray Top)Covid-19 Tri-Sucrose Vaccine  03/09/2021   PFIZER(Purple Top)SARS-COV-2 Vaccination 12/22/2019, 01/12/2020, 07/15/2020   PNEUMOCOCCAL CONJUGATE-20 03/25/2022   Pfizer Covid-19 Vaccine Bivalent Booster 70yrs & up 09/01/2021, 03/25/2022   Pneumococcal Conjugate-13 12/20/2014   Pneumococcal Polysaccharide-23 05/12/2006, 12/14/2013   Respiratory  Syncytial Virus Vaccine,Recomb Aduvanted(Arexvy) 11/04/2022   Tdap 05/01/2010, 04/05/2022   Unspecified SARS-COV-2 Vaccination 08/25/2022   Zoster Recombinant(Shingrix) 09/04/2018, 01/03/2019   Past Surgical History:  Procedure Laterality Date   COLONOSCOPY     ESOPHAGOGASTRODUODENOSCOPY     Done at [facility]   ESOPHAGOGASTRODUODENOSCOPY (EGD) WITH PROPOFOL  N/A 11/04/2017   Procedure: ESOPHAGOGASTRODUODENOSCOPY (EGD) WITH PROPOFOL ;  Surgeon: Albertus Gordy HERO, MD;  Location: [facility];  Service: Gastroenterology;  Laterality: N/A;   GI PROSTATE BIOPSY     HEMORRHOID SURGERY     INCISIONAL HERNIA REPAIR N/A 08/07/2015   Procedure: LAPAROSCOPIC INCISIONAL HERNIA repair with mesh;  Surgeon: Herlene Righter Kinsinger, MD;  Location: [facility];  Service: General;  Laterality: N/A;   KNEE SURGERY  1999   TUMOR REMOVED LEFT KNEE & ARTHROSCOPIC SURGERY   LYMPHADENECTOMY Bilateral 05/01/2014   Procedure: LYMPHADENECTOMY;  Surgeon: Ricardo Likens, MD;  Location: [facility];  Service: Urology;  Laterality: Bilateral;   MASS EXCISION Right 08/28/2020   Procedure: EXCISION RIGHT SHOULDER MASS;  Surgeon: Aron Shoulders, MD;  Location: [facility];  Service: General;  Laterality: Right;   PARTIAL KNEE ARTHROPLASTY Left 06/28/2023   Procedure: LEFT UNICOMPARTMENTAL KNEE;  Surgeon: Josefina Chew, MD;  Location: [facility];  Service: Orthopedics;  Laterality: Left;   ROBOT ASSISTED LAPAROSCOPIC RADICAL PROSTATECTOMY N/A 05/01/2014   Procedure: ROBOTIC ASSISTED LAPAROSCOPIC RADICAL PROSTATECTOMY WITH INDOCYANINE GREEN  DYE;  Surgeon: Ricardo Likens, MD;  Location: [facility];  Service: Urology;  Laterality: N/A;   UPPER GASTROINTESTINAL ENDOSCOPY      Family History  Problem Relation Age of Onset   Heart attack Father    Diabetes Mother    Diabetes Brother    Colon cancer Neg Hx    Stomach cancer Neg Hx    Esophageal cancer Neg Hx    Rectal cancer Neg Hx    Liver cancer Neg Hx    Social History   Social History Narrative   Retired  from a working as a Optician, 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 Married. Non-smoker or drinker.    Review of Systems  Constitutional:  Negative for fever and malaise/fatigue.  HENT:  Negative for congestion.   Eyes:  Negative for blurred vision.  Respiratory:  Negative for cough and shortness of breath.   Cardiovascular:  Negative for chest pain, palpitations and leg swelling.  Gastrointestinal:  Negative for vomiting.  Musculoskeletal:  Negative for back pain.  Skin:  Negative for rash.  Neurological:  Negative for loss of consciousness and headaches.     Objective:  Vitals: BP 110/70   Pulse 82   Temp 97.9 F (36.6 C)   Ht 5' 9 (1.753 m)   Wt 286 lb (129.7 kg)   SpO2 95%   BMI 42.23 kg/m   Physical Exam Vitals and nursing note reviewed.  Constitutional:      General: He is not in acute distress.    Appearance: Normal appearance. He is not ill-appearing.  HENT:     Head: Normocephalic and atraumatic.  Cardiovascular:     Rate and Rhythm: Normal rate and regular rhythm.     Pulses: Normal pulses.     Heart sounds: Normal heart sounds. No murmur heard.    No friction rub. No gallop.  Pulmonary:  Effort: Pulmonary effort is normal. No respiratory distress.     Breath sounds: Normal breath sounds. No wheezing or rales.  Skin:    General: Skin is warm and dry.  Neurological:     Mental Status: He is alert and oriented to person, place, and time. Mental status is at baseline.  Psychiatric:        Mood and Affect: Mood normal.        Behavior: Behavior normal.        Thought Content: Thought content normal.        Judgment: Judgment normal.     Results:  Studies Obtained And Personally Reviewed By Me:  Diabetic Foot Exam - Simple   Simple Foot Form Diabetic Foot exam was performed with the following findings: Yes 07/05/2024  3:26 PM  Visual Inspection No deformities, no ulcerations, no other skin breakdown bilaterally: Yes Sensation Testing Intact to touch and monofilament testing  bilaterally: Yes Pulse Check Posterior Tibialis and Dorsalis pulse intact bilaterally: Yes Comments Position Sense intact    Labs:  CBC w/ Differential Lab Results  Component Value Date   WBC 12.4 (H) 06/25/2024   RBC 3.87 (L) 06/25/2024   HGB 12.2 (L) 06/25/2024   HCT 37.0 (L) 06/25/2024   PLT 310 06/25/2024   MCV 95.6 06/25/2024   MCH 31.5 06/25/2024   MCHC 33.0 06/25/2024   RDW 14.5 06/25/2024   MPV 10.0 06/25/2024   LYMPHSABS 4.2 (H) 02/16/2024   MONOABS 0.9 02/16/2024   BASOSABS 25 06/25/2024    Comprehensive Metabolic Panel Lab Results  Component Value Date   NA 138 06/25/2024   K 4.0 06/25/2024   CL 106 06/25/2024   CO2 20 06/25/2024   GLUCOSE 96 06/25/2024   BUN 22 06/25/2024   CREATININE 1.43 (H) 06/25/2024   CALCIUM  9.6 06/25/2024   PROT 6.6 02/24/2023   ALBUMIN 3.8 01/25/2023   AST 15 02/24/2023   ALT 16 02/24/2023   ALKPHOS 71 01/25/2023   BILITOT 0.5 02/24/2023   GFR 79.76 07/23/2015   EGFR 51 (L) 06/25/2024   GFRNONAA 52 (L) 06/15/2023   Lipid Panel  Lab Results  Component Value Date   CHOL 150 12/16/2023   HDL 85 12/16/2023   LDLCALC 50 12/16/2023   TRIG 66 12/16/2023   A1c Lab Results  Component Value Date   HGBA1C 6.2 (H) 06/15/2023    TSH Lab Results  Component Value Date   TSH 1.32 02/06/2021   PSA Lab Results  Component Value Date   PSA <0.04 08/19/2022   PSA <0.04 07/21/2021   PSA <0.1 07/08/2020     Assessment & Plan:   Orders Placed This Encounter  Procedures   Hemoglobin A1c   Microalbumin / creatinine urine ratio   Meds ordered this encounter  Medications   EPINEPHrine  (EPIPEN  2-PAK) 0.3 mg/0.3 mL IJ SOAJ injection    Sig: Inject 0.3 mg into the muscle as needed for anaphylaxis.    Dispense:  1 each    Refill:  2   Leg Edema seen initially on 06/25/2024. At the time he was only taking Triamterene -HCTZ 18.75 - 12.5 mg daily per Dr. Okey, Cardiologist, so it was recommended that he take 1 whole tablet for a  dosage of 37.5 - 25 mg. 06/25/2024 BNP WNL at 8. Today, his edema has significantly improved. Blood pressure today is WNL at 110/70. Edema has improved, continue Triamterene -HCTZ 37.5 - 25 mg daily to prevent fluid retention. If swelling starts again while taking  37.5 - 25 mg contact us  or Cardiology.   Allergic Reaction to [employer], of which he's had 2 reactions within the past two months due to unintentional encounters. After consuming allergen he says that his lips swell, but has never had his throat swell shut to the point he could not breathe. In the past, has seen Dr. Asa, Allergist.  Prescribing EpiPen  for emergency use.   Diabetes Mellitus, type II treated with Metformin  500 mg daily.  06/15/2023 HgbA1c 6.2. Eye exam reportedly done at [facility], which has been requested for documentation. Since January, when he weighed 275 lbs, he has gained 11 pounds and today weighs 286 lbsBMI 42.23.  Eye exam requested. Foot exam done today was normal. Ordering HgbA1c and Microalbumin/Creatinine Ratio. Continue Januvia . Continue Ozempic.  Vaccine Counseling: discussed Covid-19 and Influenza, which he plans on updating this Fall.  Continue anti-hypertensive medication  Chronic kidney disease- continue to monitor BUN and Creatinine  Hx of gout- continue Allopurinol   Return in 2 months (on 09/14/2024) for annual labs, and then on 09/18/2024 for annual visit, or as needed.   I,Emily Lagle,acting as a Neurosurgeon for Ronal JINNY Hailstone, MD.,have documented all relevant documentation on the behalf of Ronal JINNY Hailstone, MD,as directed by  Ronal JINNY Hailstone, MD while in the presence of Ronal JINNY Hailstone, MD.  I, Ronal JINNY Hailstone, MD, have reviewed all documentation for this visit. The documentation on 07/05/2024 for the exam, diagnosis, procedures, and orders are all accurate and complete.

## 2024-07-06 LAB — MICROALBUMIN / CREATININE URINE RATIO
Creatinine, Urine: 108 mg/dL (ref 20–320)
Microalb Creat Ratio: 6 mg/g{creat} (ref <30)
Microalb, Ur: 0.7 mg/dL

## 2024-07-09 ENCOUNTER — Other Ambulatory Visit

## 2024-07-09 DIAGNOSIS — E119 Type 2 diabetes mellitus without complications: Secondary | ICD-10-CM

## 2024-07-10 ENCOUNTER — Ambulatory Visit: Payer: Self-pay | Admitting: Internal Medicine

## 2024-07-10 LAB — HEMOGLOBIN A1C
Hgb A1c MFr Bld: 6.6 % — ABNORMAL HIGH (ref <5.7)
Mean Plasma Glucose: 143 mg/dL
eAG (mmol/L): 7.9 mmol/L

## 2024-07-10 NOTE — Patient Instructions (Addendum)
 It was a pleasure to see you today. Refilled Epi-pen. Labs are stable. Watch diet and continue regular exercise.Medicare wellness visit and CPE booked for November. Try to walk some for exercise. Take meds as directed. Watch diet.

## 2024-07-17 DIAGNOSIS — C61 Malignant neoplasm of prostate: Secondary | ICD-10-CM | POA: Diagnosis not present

## 2024-07-17 DIAGNOSIS — N5201 Erectile dysfunction due to arterial insufficiency: Secondary | ICD-10-CM | POA: Diagnosis not present

## 2024-07-17 DIAGNOSIS — E291 Testicular hypofunction: Secondary | ICD-10-CM | POA: Diagnosis not present

## 2024-07-17 DIAGNOSIS — N393 Stress incontinence (female) (male): Secondary | ICD-10-CM | POA: Diagnosis not present

## 2024-07-27 ENCOUNTER — Encounter: Payer: Self-pay | Admitting: Gastroenterology

## 2024-07-27 ENCOUNTER — Telehealth: Payer: Self-pay | Admitting: Gastroenterology

## 2024-07-27 NOTE — Telephone Encounter (Signed)
 error

## 2024-08-06 ENCOUNTER — Other Ambulatory Visit: Payer: Self-pay | Admitting: Physician Assistant

## 2024-08-06 DIAGNOSIS — D649 Anemia, unspecified: Secondary | ICD-10-CM

## 2024-08-09 DIAGNOSIS — Z23 Encounter for immunization: Secondary | ICD-10-CM | POA: Diagnosis not present

## 2024-08-21 ENCOUNTER — Inpatient Hospital Stay: Attending: Internal Medicine

## 2024-08-21 ENCOUNTER — Inpatient Hospital Stay: Admitting: Internal Medicine

## 2024-08-21 ENCOUNTER — Encounter: Payer: Self-pay | Admitting: Internal Medicine

## 2024-08-21 VITALS — BP 116/64 | HR 75 | Temp 97.2°F | Resp 17 | Ht 69.0 in | Wt 276.5 lb

## 2024-08-21 DIAGNOSIS — Z79899 Other long term (current) drug therapy: Secondary | ICD-10-CM | POA: Insufficient documentation

## 2024-08-21 DIAGNOSIS — D508 Other iron deficiency anemias: Secondary | ICD-10-CM

## 2024-08-21 DIAGNOSIS — D509 Iron deficiency anemia, unspecified: Secondary | ICD-10-CM | POA: Diagnosis not present

## 2024-08-21 DIAGNOSIS — D472 Monoclonal gammopathy: Secondary | ICD-10-CM | POA: Diagnosis not present

## 2024-08-21 LAB — CBC WITH DIFFERENTIAL (CANCER CENTER ONLY)
Abs Immature Granulocytes: 0.02 K/uL (ref 0.00–0.07)
Basophils Absolute: 0 K/uL (ref 0.0–0.1)
Basophils Relative: 0 %
Eosinophils Absolute: 0.1 K/uL (ref 0.0–0.5)
Eosinophils Relative: 1 %
HCT: 34.5 % — ABNORMAL LOW (ref 39.0–52.0)
Hemoglobin: 11.8 g/dL — ABNORMAL LOW (ref 13.0–17.0)
Immature Granulocytes: 0 %
Lymphocytes Relative: 26 %
Lymphs Abs: 2.5 K/uL (ref 0.7–4.0)
MCH: 31.6 pg (ref 26.0–34.0)
MCHC: 34.2 g/dL (ref 30.0–36.0)
MCV: 92.5 fL (ref 80.0–100.0)
Monocytes Absolute: 0.7 K/uL (ref 0.1–1.0)
Monocytes Relative: 8 %
Neutro Abs: 6.3 K/uL (ref 1.7–7.7)
Neutrophils Relative %: 65 %
Platelet Count: 261 K/uL (ref 150–400)
RBC: 3.73 MIL/uL — ABNORMAL LOW (ref 4.22–5.81)
RDW: 15 % (ref 11.5–15.5)
WBC Count: 9.7 K/uL (ref 4.0–10.5)
nRBC: 0 % (ref 0.0–0.2)

## 2024-08-21 LAB — FERRITIN: Ferritin: 110 ng/mL (ref 24–336)

## 2024-08-21 LAB — IRON AND IRON BINDING CAPACITY (CC-WL,HP ONLY)
Iron: 47 ug/dL (ref 45–182)
Saturation Ratios: 14 % — ABNORMAL LOW (ref 17.9–39.5)
TIBC: 342 ug/dL (ref 250–450)
UIBC: 295 ug/dL (ref 117–376)

## 2024-08-21 NOTE — Progress Notes (Signed)
 [facility] [phone]   [phone]  OFFICE PROGRESS NOTE  Leonard Ronal PARAS, MD [address] [place] [address]  DIAGNOSIS:  1) monoclonal gammopathy of undetermined significance.  2) iron deficiency anemia.  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 Alvaro.   PRIOR THERAPY: Feraheme  infusion x 2 doses, last dose was given in October of 2014.  CURRENT THERAPY: Folivane-Plus 1 capsule p.o. daily.  INTERVAL HISTORY: Leonard [name]. 78 y.o. male returns to the clinic today for 6 months follow-up visit.Discussed the use of AI scribe software for clinical note transcription with the patient, who gave verbal consent to proceed.  History of Present Illness Leonard [name]. is a 78 year old male with monoclonal gammopathy of undetermined significance and iron deficiency anemia who presents for evaluation and repeat CBC, iron study, and ferritin for evaluation of his anemia.  He feels well with no complaints of fatigue, weakness, or bleeding. No blood in stool or urine.  He has a history of iron deficiency anemia and has been treated in the past with iron infusion using Feraheme . Currently, he is taking Folivane Plus, one capsule per day, as a supplement for his condition.  His current hemoglobin level is 11.8, and a previous level was 12.2. He is awaiting results from his current iron study.  No fatigue, weakness, bleeding, blood in stool, or blood in urine.    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 09/05/2006   EGD   Barrett's esophagus 02/2006   CKD (chronic kidney disease)    Coronary artery disease    Diverticulosis of colon (without mention of hemorrhage) 09/05/2006   Colonoscopy    Gastric polyp  2014   done at [facility]   GERD (gastroesophageal reflux disease) 07/26/2001   EGD(Chronic)   Gouty arthropathy    Hemorrhoids    History of hiatal hernia    History of kidney stones    Hx of colonic polyps 09/05/2006   Colonoscopy(ADENOMATOUS POLYP)   Iron deficiency anemia, unspecified    Monoclonal gammopathy    OSA (obstructive sleep apnea)    USES CPAP MACHINE    Other and unspecified hyperlipidemia    Prostate cancer (HCC) 2015   Type II or unspecified type diabetes mellitus without mention of complication, not stated as uncontrolled    Unspecified essential hypertension     ALLERGIES:  is allergic to dicloxacillin, cayenne pepper [cayenne], and nsaids.  MEDICATIONS:  Current Outpatient Medications  Medication Sig Dispense Refill   allopurinol  (ZYLOPRIM ) 300 MG tablet TAKE 1 TABLET BY MOUTH DAILY TO PREVENT GOUT 90 tablet 3   amLODipine -benazepril  (LOTREL) 10-20 MG capsule TAKE 1 CAPSULE BY MOUTH EVERY DAY 90 capsule 3   aspirin 81 MG tablet Take 81 mg by mouth daily.     atorvastatin  (LIPITOR) 10 MG tablet TAKE 1 TABLET BY MOUTH EVERY DAY 90 tablet 3   baclofen  (LIORESAL ) 10 MG tablet Take 1 tablet (10 mg total) by mouth 3 (three) times daily. As needed for muscle spasm 50 tablet 0   EPINEPHrine  (EPIPEN  2-PAK) 0.3 mg/0.3 mL IJ SOAJ injection Inject 0.3 mg into the muscle as needed for anaphylaxis. 1 each 2   esomeprazole  (NEXIUM ) 20 MG capsule Take 20 mg by mouth daily at 12 noon.  FeFum-FePoly-FA-B Cmp-C-Biot (IRON FOLATE PLUS) CAPS TAKE 1 CAPSULE BY MOUTH EVERY DAY IN THE MORNING 90 capsule 1   metFORMIN  (GLUCOPHAGE ) 500 MG tablet TAKE 1 TABLET (500 MG TOTAL) BY MOUTH 1 DAY OR 1 DOSE. 90 tablet 3   metoprolol  succinate (TOPROL -XL) 25 MG 24 hr tablet TAKE 1/2 TABLET BY MOUTH DAILY 45 tablet 3   nitroGLYCERIN  (NITROSTAT ) 0.4 MG SL tablet Place 1 tablet (0.4 mg total) under the tongue every 5 (five) minutes as needed for chest pain. Reported on 03/04/2016 30 tablet 5    ondansetron  (ZOFRAN ) 4 MG tablet Take 1 tablet (4 mg total) by mouth every 8 (eight) hours as needed for nausea or vomiting. (Patient not taking: Reported on 06/25/2024) 10 tablet 0   OZEMPIC, 0.25 OR 0.5 MG/DOSE, 2 MG/3ML SOPN Inject 0.5 mg into the skin once a week. (Patient not taking: Reported on 06/25/2024)     sennosides-docusate sodium  (SENOKOT-S) 8.6-50 MG tablet Take 2 tablets by mouth daily. (Patient not taking: Reported on 06/25/2024) 30 tablet 1   sitaGLIPtin  (JANUVIA ) 100 MG tablet TAKE 1 TABLET BY MOUTH EVERY DAY 90 tablet 3   triamterene -hydrochlorothiazide (MAXZIDE-25) 37.5-25 MG tablet TAKE 1 TABLET BY MOUTH EVERY DAY 90 tablet 3   No current facility-administered medications for this visit.    SURGICAL HISTORY:  Past Surgical History:  Procedure Laterality Date   COLONOSCOPY     ESOPHAGOGASTRODUODENOSCOPY     Done at [facility]   ESOPHAGOGASTRODUODENOSCOPY (EGD) WITH PROPOFOL  N/A 11/04/2017   Procedure: ESOPHAGOGASTRODUODENOSCOPY (EGD) WITH PROPOFOL ;  Surgeon: Albertus Gordy HERO, MD;  [facility];  Service: Gastroenterology;  Laterality: N/A;   GI PROSTATE BIOPSY     HEMORRHOID SURGERY     INCISIONAL HERNIA REPAIR N/A 08/07/2015   Procedure: LAPAROSCOPIC INCISIONAL HERNIA repair with mesh;  Surgeon: Herlene Righter Kinsinger, MD;  Location: [facility];  Service: General;  Laterality: N/A;   KNEE SURGERY  1999   TUMOR REMOVED LEFT KNEE & ARTHROSCOPIC SURGERY   LYMPHADENECTOMY Bilateral 05/01/2014   Procedure: LYMPHADENECTOMY;  Surgeon: Ricardo Likens, MD;  Location: [facility];  Service: Urology;  Laterality: Bilateral;   MASS EXCISION Right 08/28/2020   Procedure: EXCISION RIGHT SHOULDER MASS;  Surgeon: Aron Shoulders, MD;  Location: [facility];  Service: General;  Laterality: Right;   PARTIAL KNEE ARTHROPLASTY Left 06/28/2023   Procedure: LEFT UNICOMPARTMENTAL KNEE;  Surgeon: Josefina Chew, MD;  Location[facility];  Service: Orthopedics;  Laterality: Left;   ROBOT ASSISTED LAPAROSCOPIC RADICAL  PROSTATECTOMY N/A 05/01/2014   Procedure: ROBOTIC ASSISTED LAPAROSCOPIC RADICAL PROSTATECTOMY WITH INDOCYANINE GREEN  DYE;  Surgeon: Ricardo Likens, MD;  Location: [facility];  Service: Urology;  Laterality: N/A;   UPPER GASTROINTESTINAL ENDOSCOPY      REVIEW OF SYSTEMS:  A comprehensive review of systems was negative except for: Constitutional: positive for fatigue Musculoskeletal: positive for arthralgias   PHYSICAL EXAMINATION: General appearance: alert, cooperative, fatigued, and no distress Head: Normocephalic, without obvious abnormality, atraumatic Neck: no adenopathy Lymph nodes: Cervical, supraclavicular, and axillary nodes normal. Resp: clear to auscultation bilaterally Back: symmetric, no curvature. ROM normal. No CVA tenderness. Cardio: irregularly irregular rhythm GI: soft, non-tender; bowel sounds normal; no masses,  no organomegaly Extremities: extremities normal, atraumatic, no cyanosis or edema  ECOG PERFORMANCE STATUS: 1 - Symptomatic but completely ambulatory  Blood pressure 116/64, pulse 75, temperature (!) 97.2 F (36.2 C), resp. rate 17, height 5' 9 (1.753 m), weight 276 lb 8 oz (125.4 kg), SpO2 95%.  LABORATORY DATA: Lab Results  Component  Value Date   WBC 9.7 08/21/2024   HGB 11.8 (L) 08/21/2024   HCT 34.5 (L) 08/21/2024   MCV 92.5 08/21/2024   PLT 261 08/21/2024      Chemistry      Component Value Date/Time   NA 138 06/25/2024 1433   NA 137 06/24/2021 1227   NA 140 09/14/2017 0928   K 4.0 06/25/2024 1433   K 4.0 09/14/2017 0928   CL 106 06/25/2024 1433   CL 104 04/17/2013 0857   CO2 20 06/25/2024 1433   CO2 27 09/14/2017 0928   BUN 22 06/25/2024 1433   BUN 30 (H) 06/24/2021 1227   BUN 28.3 (H) 09/14/2017 0928   CREATININE 1.43 (H) 06/25/2024 1433   CREATININE 1.4 (H) 09/14/2017 0928      Component Value Date/Time   CALCIUM  9.6 06/25/2024 1433   CALCIUM  10.1 09/14/2017 0928   ALKPHOS 71 01/25/2023 1001   ALKPHOS 80 09/14/2017 0928   AST 15  02/24/2023 0914   AST 14 (L) 01/25/2023 1001   AST 17 09/14/2017 0928   ALT 16 02/24/2023 0914   ALT 17 01/25/2023 1001   ALT 18 09/14/2017 0928   BILITOT 0.5 02/24/2023 0914   BILITOT 0.5 01/25/2023 1001   BILITOT 0.45 09/14/2017 0928     RADIOGRAPHIC STUDIES: No results found.  ASSESSMENT AND PLAN:  This is a very pleasant 78 years old African-American male with iron deficiency anemia secondary to history of gastrointestinal blood loss.  The patient also has a history of MGUS and currently on observation. Assessment and Plan Assessment & Plan Monoclonal gammopathy of undetermined significance (MGUS) MGUS is well-managed with no new symptoms and stable hemoglobin levels. - Repeat myeloma panel in six months.  Iron deficiency anemia Iron deficiency anemia is well-managed with current treatment. Hemoglobin decreased from 12.2 to 11.8 but remains within acceptable range. No symptoms of bleeding or fatigue. - Continue Folivane Plus, one capsule per day. - Repeat CBC, iron study, and ferritin in six months. He was advised to call immediately if he has any concerning symptoms in the interval.  The patient voices understanding of current disease status and treatment options and is in agreement with the current care plan. All questions were answered. The patient knows to call the clinic with any problems, questions or concerns. We can certainly see the patient much sooner if necessary.   Disclaimer: This note was dictated with voice recognition software. Similar sounding words can inadvertently be transcribed and may not be corrected upon review.

## 2024-09-14 ENCOUNTER — Other Ambulatory Visit: Payer: Self-pay | Admitting: Internal Medicine

## 2024-09-14 ENCOUNTER — Other Ambulatory Visit: Payer: Medicare Other

## 2024-09-14 DIAGNOSIS — Z125 Encounter for screening for malignant neoplasm of prostate: Secondary | ICD-10-CM | POA: Diagnosis not present

## 2024-09-14 DIAGNOSIS — E119 Type 2 diabetes mellitus without complications: Secondary | ICD-10-CM | POA: Diagnosis not present

## 2024-09-14 DIAGNOSIS — Z8546 Personal history of malignant neoplasm of prostate: Secondary | ICD-10-CM | POA: Diagnosis not present

## 2024-09-14 DIAGNOSIS — I2510 Atherosclerotic heart disease of native coronary artery without angina pectoris: Secondary | ICD-10-CM

## 2024-09-14 DIAGNOSIS — E1169 Type 2 diabetes mellitus with other specified complication: Secondary | ICD-10-CM | POA: Diagnosis not present

## 2024-09-14 DIAGNOSIS — I10 Essential (primary) hypertension: Secondary | ICD-10-CM | POA: Diagnosis not present

## 2024-09-14 DIAGNOSIS — E785 Hyperlipidemia, unspecified: Secondary | ICD-10-CM | POA: Diagnosis not present

## 2024-09-15 LAB — CBC WITH DIFFERENTIAL/PLATELET
Absolute Lymphocytes: 4118 {cells}/uL — ABNORMAL HIGH (ref 850–3900)
Absolute Monocytes: 767 {cells}/uL (ref 200–950)
Basophils Absolute: 35 {cells}/uL (ref 0–200)
Basophils Relative: 0.3 %
Eosinophils Absolute: 130 {cells}/uL (ref 15–500)
Eosinophils Relative: 1.1 %
HCT: 37.1 % — ABNORMAL LOW (ref 38.5–50.0)
Hemoglobin: 12.1 g/dL — ABNORMAL LOW (ref 13.2–17.1)
MCH: 31.5 pg (ref 27.0–33.0)
MCHC: 32.6 g/dL (ref 32.0–36.0)
MCV: 96.6 fL (ref 80.0–100.0)
MPV: 10 fL (ref 7.5–12.5)
Monocytes Relative: 6.5 %
Neutro Abs: 6750 {cells}/uL (ref 1500–7800)
Neutrophils Relative %: 57.2 %
Platelets: 307 Thousand/uL (ref 140–400)
RBC: 3.84 Million/uL — ABNORMAL LOW (ref 4.20–5.80)
RDW: 14.8 % (ref 11.0–15.0)
Total Lymphocyte: 34.9 %
WBC: 11.8 Thousand/uL — ABNORMAL HIGH (ref 3.8–10.8)

## 2024-09-15 LAB — COMPREHENSIVE METABOLIC PANEL WITH GFR
AG Ratio: 1.5 (calc) (ref 1.0–2.5)
ALT: 20 U/L (ref 9–46)
AST: 16 U/L (ref 10–35)
Albumin: 4 g/dL (ref 3.6–5.1)
Alkaline phosphatase (APISO): 87 U/L (ref 35–144)
BUN/Creatinine Ratio: 17 (calc) (ref 6–22)
BUN: 28 mg/dL — ABNORMAL HIGH (ref 7–25)
CO2: 26 mmol/L (ref 20–32)
Calcium: 9.8 mg/dL (ref 8.6–10.3)
Chloride: 105 mmol/L (ref 98–110)
Creat: 1.63 mg/dL — ABNORMAL HIGH (ref 0.70–1.28)
Globulin: 2.7 g/dL (ref 1.9–3.7)
Glucose, Bld: 110 mg/dL — ABNORMAL HIGH (ref 65–99)
Potassium: 4 mmol/L (ref 3.5–5.3)
Sodium: 140 mmol/L (ref 135–146)
Total Bilirubin: 0.4 mg/dL (ref 0.2–1.2)
Total Protein: 6.7 g/dL (ref 6.1–8.1)
eGFR: 43 mL/min/1.73m2 — ABNORMAL LOW (ref >=60)

## 2024-09-15 LAB — HEMOGLOBIN A1C
Hgb A1c MFr Bld: 6.2 % — ABNORMAL HIGH (ref <5.7)
Mean Plasma Glucose: 131 mg/dL
eAG (mmol/L): 7.3 mmol/L

## 2024-09-15 LAB — MICROALBUMIN / CREATININE URINE RATIO
Creatinine, Urine: 204 mg/dL (ref 20–320)
Microalb Creat Ratio: 7 mg/g{creat} (ref <30)
Microalb, Ur: 1.5 mg/dL

## 2024-09-15 LAB — LIPID PANEL
Cholesterol: 150 mg/dL (ref <200)
HDL: 80 mg/dL (ref >=40)
LDL Cholesterol (Calc): 55 mg/dL
Non-HDL Cholesterol (Calc): 70 mg/dL (ref <130)
Total CHOL/HDL Ratio: 1.9 (calc) (ref <5.0)
Triglycerides: 73 mg/dL (ref <150)

## 2024-09-15 LAB — PSA: PSA: 0.07 ng/mL (ref <=4.00)

## 2024-09-17 ENCOUNTER — Other Ambulatory Visit: Payer: Self-pay | Admitting: Internal Medicine

## 2024-09-18 ENCOUNTER — Encounter: Payer: Self-pay | Admitting: Internal Medicine

## 2024-09-18 ENCOUNTER — Ambulatory Visit: Payer: Medicare Other | Admitting: Internal Medicine

## 2024-09-18 VITALS — BP 110/70 | HR 84 | Ht 69.0 in | Wt 283.0 lb

## 2024-09-18 DIAGNOSIS — I2510 Atherosclerotic heart disease of native coronary artery without angina pectoris: Secondary | ICD-10-CM

## 2024-09-18 DIAGNOSIS — E785 Hyperlipidemia, unspecified: Secondary | ICD-10-CM | POA: Diagnosis not present

## 2024-09-18 DIAGNOSIS — Z0000 Encounter for general adult medical examination without abnormal findings: Secondary | ICD-10-CM | POA: Diagnosis not present

## 2024-09-18 DIAGNOSIS — D509 Iron deficiency anemia, unspecified: Secondary | ICD-10-CM | POA: Diagnosis not present

## 2024-09-18 DIAGNOSIS — M170 Bilateral primary osteoarthritis of knee: Secondary | ICD-10-CM

## 2024-09-18 DIAGNOSIS — Z860101 Personal history of adenomatous and serrated colon polyps: Secondary | ICD-10-CM

## 2024-09-18 DIAGNOSIS — Z9079 Acquired absence of other genital organ(s): Secondary | ICD-10-CM

## 2024-09-18 DIAGNOSIS — D472 Monoclonal gammopathy: Secondary | ICD-10-CM

## 2024-09-18 DIAGNOSIS — I10 Essential (primary) hypertension: Secondary | ICD-10-CM

## 2024-09-18 DIAGNOSIS — Z8546 Personal history of malignant neoplasm of prostate: Secondary | ICD-10-CM

## 2024-09-18 DIAGNOSIS — Z8739 Personal history of other diseases of the musculoskeletal system and connective tissue: Secondary | ICD-10-CM

## 2024-09-18 DIAGNOSIS — M5431 Sciatica, right side: Secondary | ICD-10-CM

## 2024-09-18 DIAGNOSIS — E1169 Type 2 diabetes mellitus with other specified complication: Secondary | ICD-10-CM

## 2024-09-18 DIAGNOSIS — Z1211 Encounter for screening for malignant neoplasm of colon: Secondary | ICD-10-CM

## 2024-09-18 DIAGNOSIS — Z7984 Long term (current) use of oral hypoglycemic drugs: Secondary | ICD-10-CM

## 2024-09-18 DIAGNOSIS — N1832 Chronic kidney disease, stage 3b: Secondary | ICD-10-CM

## 2024-09-18 DIAGNOSIS — T7819XA Other adverse food reactions, not elsewhere classified, initial encounter: Secondary | ICD-10-CM

## 2024-09-18 DIAGNOSIS — Z96652 Presence of left artificial knee joint: Secondary | ICD-10-CM

## 2024-09-18 DIAGNOSIS — G4733 Obstructive sleep apnea (adult) (pediatric): Secondary | ICD-10-CM

## 2024-09-18 DIAGNOSIS — Z6841 Body Mass Index (BMI) 40.0 and over, adult: Secondary | ICD-10-CM

## 2024-09-18 DIAGNOSIS — N1831 Chronic kidney disease, stage 3a: Secondary | ICD-10-CM

## 2024-09-18 LAB — POCT URINALYSIS DIP (CLINITEK)
Bilirubin, UA: NEGATIVE
Blood, UA: NEGATIVE
Glucose, UA: NEGATIVE mg/dL
Ketones, POC UA: NEGATIVE mg/dL
Leukocytes, UA: NEGATIVE
Nitrite, UA: NEGATIVE
POC PROTEIN,UA: NEGATIVE
Spec Grav, UA: 1.015 (ref 1.010–1.025)
Urobilinogen, UA: 0.2 U/dL
pH, UA: 6.5 (ref 5.0–8.0)

## 2024-09-18 NOTE — Progress Notes (Addendum)
 He Annual Wellness Visit   Patient Care Team: Jorgen Wolfinger, Ronal PARAS, MD as PCP - General (Internal Medicine) Okey Vina GAILS, MD as PCP - Cardiology (Cardiology) Aleene Kitchens, MD (Inactive) (Urology)  Visit Date: 09/18/24   Chief Complaint  Patient presents with   Annual Exam   Subjective:  Patient: Leonard [name]., Male DOB: 25-Feb-1946, 78 y.o. MRN: [id number] Vitals:   09/18/24 1405  BP: 110/70   Abdulrahim Siddiqi. is a 78 y.o. Male who presents today for his Annual Wellness Visit. Patient has Dyslipidemia; Obstructive sleep apnea; Essential hypertension; CAD (coronary artery disease); History of prostate cancer; Diabetes mellitus type 2, controlled, without complications (HCC); Gout; GERD (gastroesophageal reflux disease); Morbid obesity (HCC); Iron deficiency anemia; MGUS (monoclonal gammopathy of unknown significance); Open angle with borderline findings, high risk; Mass of skin of right shoulder; Rectal bleeding; and Osteoarthritis of both knees on their problem list.    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 Alvaro.   Labs 09/14/2024 WBC 11.8, RBC 3.84, Hemoglobin 12.1, HCT 37.1, Absolute Lymphocytes 4,118, Blood glucose 110, BUN 28, Creatinine 1.63, eGFR 43, HgbA1c 6.2%, Otherwise WNL.    04/27/2021 Colonoscopy Three 4 to 8 mm polyps in the ascending colon. Resected and retrieved. Pathology found to be benign but precancerous Diverticulosis in the sigmoid colon, in the descending colon and in the ascending colon. Internal hemorrhoids. This is the source of intermittent red blood per rectum. No repeat colonoscopy due to age.   Vaccine counseling: UTD on Influenza vaccine, Covid-19 vaccine due .    Health Maintenance  Topic Date Due   OPHTHALMOLOGY EXAM  11/24/2019   COVID-19 Vaccine (8 - 2025-26 season) 07/16/2024   Medicare Annual Wellness (AWV)   09/14/2024   HEMOGLOBIN A1C  03/14/2025   FOOT EXAM  07/05/2025   Diabetic kidney evaluation - eGFR measurement  09/14/2025   Diabetic kidney evaluation - Urine ACR  09/14/2025   DTaP/Tdap/Td (3 - Td or Tdap) 04/05/2032   Pneumococcal Vaccine: 50+ Years  Completed   Influenza Vaccine  Completed   Zoster Vaccines- Shingrix  Completed   Meningococcal B Vaccine  Aged Out   Colonoscopy  Discontinued   Hepatitis C Screening  Discontinued     Review of Systems  Constitutional:  Negative for fever and malaise/fatigue.  HENT:  Negative for congestion.   Eyes:  Negative for blurred vision.  Respiratory:  Negative for cough and shortness of breath.   Cardiovascular:  Negative for chest pain, palpitations and leg swelling.  Gastrointestinal:  Negative for vomiting.  Musculoskeletal:  Negative for back pain.  Skin:  Negative for rash.  Neurological:  Negative for loss of consciousness and headaches.   Objective:  Vitals: body mass index is 41.79 kg/m. Today's Vitals   09/18/24 1405  BP: 110/70  Pulse: 84  SpO2: 98%  Weight: 283 lb (128.4 kg)  Height: 5' 9 (1.753 m)  PainSc: 0-No pain   Physical Exam Vitals and nursing note reviewed. Exam conducted with a chaperone present.  Constitutional:      General: He is awake. He is not in acute distress.    Appearance: Normal appearance. He is not ill-appearing or toxic-appearing.  HENT:     Head: Normocephalic and atraumatic.     Right Ear: Tympanic membrane, ear canal and external ear normal.     Left Ear: Tympanic membrane, ear canal  and external ear normal.     Ears:     Comments: Cerumen in the left ear.    Mouth/Throat:     Pharynx: Oropharynx is clear.  Eyes:     Extraocular Movements: Extraocular movements intact.     Pupils: Pupils are equal, round, and reactive to light.  Neck:     Thyroid : No thyroid  mass, thyromegaly or thyroid  tenderness.     Vascular: No carotid bruit.  Cardiovascular:     Rate and Rhythm: Normal  rate and regular rhythm. Occasional Extrasystoles are present.    Pulses:          Dorsalis pedis pulses are 1+ on the right side and 1+ on the left side.       Posterior tibial pulses are 2+ on the right side and 2+ on the left side.     Heart sounds: Normal heart sounds. No murmur heard.    No friction rub. No gallop.  Pulmonary:     Effort: Pulmonary effort is normal.     Breath sounds: Normal breath sounds. No decreased breath sounds, wheezing, rhonchi or rales.  Chest:     Chest wall: No mass.  Abdominal:     Palpations: Abdomen is soft. There is no hepatomegaly, splenomegaly or mass.     Tenderness: There is no abdominal tenderness.     Hernia: No hernia is present.  Genitourinary:    Prostate: Normal. Not enlarged, not tender and no nodules present.     Rectum: Normal. Guaiac result negative.  Musculoskeletal:     Cervical back: Normal range of motion.     Right lower leg: Edema present.     Left lower leg: Edema present.     Comments: Bilateral trace edema   Feet:     Comments: Positional sense intact. Sensation intact.  Lymphadenopathy:     Cervical: No cervical adenopathy.     Upper Body:     Right upper body: No supraclavicular adenopathy.     Left upper body: No supraclavicular adenopathy.  Skin:    General: Skin is warm and dry.  Neurological:     General: No focal deficit present.     Mental Status: He is alert and oriented to person, place, and time. Mental status is at baseline.     Cranial Nerves: Cranial nerves 2-12 are intact.     Sensory: Sensation is intact.     Motor: Motor function is intact.     Coordination: Coordination is intact.     Gait: Gait is intact.     Deep Tendon Reflexes: Reflexes are normal and symmetric.  Psychiatric:        Attention and Perception: Attention normal.        Mood and Affect: Mood normal.        Speech: Speech normal.        Behavior: Behavior normal. Behavior is cooperative.        Thought Content: Thought content  normal.        Cognition and Memory: Cognition and memory normal.        Judgment: Judgment normal.     Current Outpatient Medications  Medication Instructions   allopurinol  (ZYLOPRIM ) 300 MG tablet TAKE 1 TABLET BY MOUTH DAILY TO PREVENT GOUT   amLODipine -benazepril  (LOTREL) 10-20 MG capsule Oral, Daily   aspirin 81 mg, Daily   atorvastatin  (LIPITOR) 10 mg, Oral, Daily   baclofen  (LIORESAL ) 10 mg, Oral, 3 times daily, As needed for muscle spasm  EPINEPHrine  (EPIPEN  2-PAK) 0.3 mg, Intramuscular, As needed   esomeprazole  (NEXIUM ) 20 mg, Daily   FeFum-FePoly-FA-B Cmp-C-Biot (IRON FOLATE PLUS) CAPS TAKE 1 CAPSULE BY MOUTH EVERY DAY IN THE MORNING   Januvia  100 mg, Oral, Daily   metFORMIN  (GLUCOPHAGE ) 500 mg, Oral, 1 Day/Dose   metoprolol  succinate (TOPROL -XL) 12.5 mg, Oral, Daily   nitroGLYCERIN  (NITROSTAT ) 0.4 mg, Sublingual, Every 5 min PRN, Reported on 03/04/2016   ondansetron  (ZOFRAN ) 4 mg, Oral, Every 8 hours PRN   Ozempic (0.25 or 0.5 MG/DOSE) 0.5 mg, Weekly   sennosides-docusate sodium  (SENOKOT-S) 8.6-50 MG tablet 2 tablets, Oral, Daily   triamterene -hydrochlorothiazide (MAXZIDE-25) 37.5-25 MG tablet 1 tablet, Oral, Daily   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 09/05/2006   EGD   Barrett's esophagus 02/2006   CKD (chronic kidney disease)    Coronary artery disease    Diverticulosis of colon (without mention of hemorrhage) 09/05/2006   Colonoscopy    Gastric polyp 2014   [facility] at [facility]   GERD (gastroesophageal reflux disease) 07/26/2001   EGD(Chronic)   Gouty arthropathy    Hemorrhoids    History of hiatal hernia    History of kidney stones    Hx of colonic polyps 09/05/2006   Colonoscopy(ADENOMATOUS POLYP)   Iron deficiency anemia, unspecified    Monoclonal gammopathy    OSA (obstructive sleep apnea)    USES CPAP MACHINE    Other and unspecified hyperlipidemia    Prostate cancer (HCC) 2015   Type II or unspecified  type diabetes mellitus without mention of complication, not stated as uncontrolled    Unspecified essential hypertension    Medical/Surgical History Narrative:  Allergic/Intolerant to:  Allergies  Allergen Reactions   Dicloxacillin Rash and Other (See Comments)    Has patient had a PCN reaction causing immediate rash, facial/tongue/throat swelling, SOB or lightheadedness with hypotension: No Has patient had a PCN reaction causing severe rash involving mucus membranes or skin necrosis: No Has patient had a PCN reaction that required hospitalization: No Has patient had a PCN reaction occurring within the last 10 years: No If all of the above answers are NO, then may proceed with Cephalosporin use.    Cayenne Pepper [Cayenne] Swelling   Nsaids Other (See Comments)    PT declines Acute renal failure 08/09/2015    Past Surgical History:  Procedure Laterality Date   COLONOSCOPY     ESOPHAGOGASTRODUODENOSCOPY     Done at [facility]   ESOPHAGOGASTRODUODENOSCOPY (EGD) WITH PROPOFOL  N/A 11/04/2017   Procedure: ESOPHAGOGASTRODUODENOSCOPY (EGD) WITH PROPOFOL ;  Surgeon: Albertus Gordy HERO, MD;  Location: [facility];  Service: Gastroenterology;  Laterality: N/A;   GI PROSTATE BIOPSY     HEMORRHOID SURGERY     INCISIONAL HERNIA REPAIR N/A 08/07/2015   Procedure: LAPAROSCOPIC INCISIONAL HERNIA repair with mesh;  Surgeon: Herlene Righter Kinsinger, MD;  Location: [facility];  Service: General;  Laterality: N/A;   KNEE SURGERY  1999   TUMOR REMOVED LEFT KNEE & ARTHROSCOPIC SURGERY   LYMPHADENECTOMY Bilateral 05/01/2014   Procedure: LYMPHADENECTOMY;  Surgeon: Ricardo Likens, MD;  Location: [facility];  Service: Urology;  Laterality: Bilateral;   MASS EXCISION Right 08/28/2020   Procedure: EXCISION RIGHT SHOULDER MASS;  Surgeon: Aron Shoulders, MD;  Location: [facility] OR;  Service: General;  Laterality: Right;   PARTIAL KNEE ARTHROPLASTY Left 06/28/2023   Procedure: LEFT UNICOMPARTMENTAL KNEE;  Surgeon: Josefina Chew, MD;   Location: [facility];  Service: Orthopedics;  Laterality: Left;  ROBOT ASSISTED LAPAROSCOPIC RADICAL PROSTATECTOMY N/A 05/01/2014   Procedure: ROBOTIC ASSISTED LAPAROSCOPIC RADICAL PROSTATECTOMY WITH INDOCYANINE GREEN  DYE;  Surgeon: Ricardo Likens, MD;  Location: [facility];  Service: Urology;  Laterality: N/A;   UPPER GASTROINTESTINAL ENDOSCOPY     Family History  Problem Relation Age of Onset   Heart attack Father    Diabetes Mother    Diabetes Brother    Colon cancer Neg Hx    Stomach cancer Neg Hx    Esophageal cancer Neg Hx    Rectal cancer Neg Hx    Liver cancer Neg Hx    Family History Narrative: Father died of an MI. Mother died of an MI. 2 brothers with history of diabetes. Patient has 1 sister. Mother had diabetes in both parents and hypertension.  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 Married. Non-smoker or drinker.    Most Recent Health Risks Assessment:   Most Recent Social Determinants of Health (Including Hx of Tobacco, Alcohol, and Drug Use) SDOH Screenings   Food Insecurity: No Food Insecurity (09/17/2024)  Housing: Unknown (09/17/2024)  Transportation Needs: No Transportation Needs (09/17/2024)  Utilities: Not At Risk (09/15/2023)  Alcohol Screen: Low Risk  (09/15/2023)  Depression (PHQ2-9): Low Risk  (03/13/2024)  Financial Resource Strain: Low Risk  (09/17/2024)  Physical Activity: Insufficiently Active (09/17/2024)  Social Connections: Socially Integrated (09/17/2024)  Stress: No Stress Concern Present (09/17/2024)  Tobacco Use: Low Risk  (09/18/2024)  Health Literacy: Adequate Health Literacy (09/15/2023)   Social History   Tobacco Use   Smoking status: Never   Smokeless tobacco: Never  Vaping Use   Vaping status: Never Used  Substance Use Topics   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 0  Vision? 0  Difficulty concentrating or making decisions? 0  Walking or climbing stairs? 1  Dressing or bathing? 0  Doing errands, shopping? 0  Preparing Food and eating ? N  Using the Toilet? N  In the past six months, have you accidently leaked urine? N  Do you have problems with loss of bowel control? N  Managing your Medications? N  Managing your Finances? N  Housekeeping or managing your Housekeeping? N   Most Recent Fall Risk Assessment:    09/18/2024   10:45 AM  Fall Risk   Falls in the past year? 0  Injury with Fall? 0   Most Recent Anxiety/Depression Screenings:    03/13/2024    9:38 AM 09/15/2023   11:05 AM  PHQ 2/9 Scores  PHQ - 2 Score 0 0      03/13/2024    9:38 AM  GAD 7 : Generalized Anxiety Score  Nervous, Anxious, on Edge 0  Control/stop worrying 0  Worry too much - different things 0  Trouble relaxing 0  Restless 0  Easily annoyed or irritable 0  Afraid - awful might happen 0  Total GAD 7 Score 0  Anxiety Difficulty Not difficult at all   Most Recent Cognitive Screening:    09/15/2023   11:09 AM  6CIT Screen  What Year? 0 points  What month? 0 points  What time? 0 points  Count back from 20 0 points  Months in reverse  0 points  Repeat phrase 0 points  Total Score 0 points    Results:  Studies Obtained And Personally Reviewed By Me:   04/27/2021 Colonoscopy Three 4 to 8 mm polyps in the ascending colon. Resected and retrieved. Pathology found to be benign but precancerous Diverticulosis in the sigmoid colon, in the descending colon and in the ascending colon. Internal hemorrhoids. This is the source of intermittent red blood per rectum. No repeat colonoscopy due to age.  Labs:  CBC w/ Differential Lab Results  Component Value Date   WBC 11.8 (H) 09/14/2024   RBC 3.84 (L) 09/14/2024   HGB 12.1 (L) 09/14/2024   HCT 37.1 (L) 09/14/2024   PLT 307  09/14/2024   MCV 96.6 09/14/2024   MCH 31.5 09/14/2024   MCHC 32.6 09/14/2024   RDW 14.8 09/14/2024   MPV 10.0 09/14/2024   LYMPHSABS 2.5 08/21/2024   MONOABS 0.7 08/21/2024   BASOSABS 35 09/14/2024    Comprehensive Metabolic Panel Lab Results  Component Value Date   NA 140 09/14/2024   K 4.0 09/14/2024   CL 105 09/14/2024   CO2 26 09/14/2024   GLUCOSE 110 (H) 09/14/2024   BUN 28 (H) 09/14/2024   CREATININE 1.63 (H) 09/14/2024   CALCIUM  9.8 09/14/2024   PROT 6.7 09/14/2024   ALBUMIN 3.8 01/25/2023   AST 16 09/14/2024   ALT 20 09/14/2024   ALKPHOS 71 01/25/2023   BILITOT 0.4 09/14/2024   GFR 79.76 07/23/2015   EGFR 43 (L) 09/14/2024   GFRNONAA 52 (L) 06/15/2023   Lipid Panel  Lab Results  Component Value Date   CHOL 150 09/14/2024   HDL 80 09/14/2024   LDLCALC 55 09/14/2024   TRIG 73 09/14/2024   A1c Lab Results  Component Value Date   HGBA1C 6.2 (H) 09/14/2024    TSH Lab Results  Component Value Date   TSH 1.32 02/06/2021   09/14/2024 PSA 0.07 Results for orders placed or performed in visit on 09/18/24  POCT URINALYSIS DIP (CLINITEK)  Result Value Ref Range   Color, UA yellow yellow   Clarity, UA clear clear   Glucose, UA negative negative mg/dL   Bilirubin, UA negative negative   Ketones, POC UA negative negative mg/dL   Spec Grav, UA 8.984 8.989 - 1.025   Blood, UA negative negative   pH, UA 6.5 5.0 - 8.0   POC PROTEIN,UA negative negative, trace   Urobilinogen, UA 0.2 0.2 or 1.0 E.U./dL   Nitrite, UA Negative Negative   Leukocytes, UA Negative Negative   Assessment & Plan:   Orders Placed This Encounter  Procedures   POCT URINALYSIS DIP (CLINITEK)   Diabetes Mellitus, type II: treated with Metformin  500 mg daily, Januvia  100 mg daily . 09/14/2024 HgbA1c 6.2% decreased from 6.6% two months ago.    Hypertension: treated with lotrel 10-20 mg every day, Metoprolol  succinate 25 mg half tablet daily.. Dependent edema treated with Maxzide-25  37.5-25 mg daily. Blood pressure today is normal at 110/70.    Hyperlipidemia: treated with Lipitor 10 mg daily. 09/14/2024 Lipid panel normal.  Iron Deficiency Anemia: treated with Iron Folate Plus one capsule daily.  WBC 11.8, RBC 3.84, Hemoglobin 12.1, HCT 37..   Monoclonal Gammopathy of Undetermined Significance followed by Dr. Gatha, Oncologist. Last seen 08/21/2024. Next visit in Spring 2025.  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 Alvaro.   04/27/2021 Colonoscopy  Three 4 to 8 mm polyps in the ascending colon. Resected and retrieved. Pathology found to be benign but precancerous Diverticulosis in the sigmoid colon, in the descending colon and in the ascending colon. Internal hemorrhoids. This is the source of intermittent red blood per rectum. No repeat colonoscopy due to age.   Vaccine counseling: UTD on Influenza vaccine, Covid-19 vaccine due .   Sleep apnea- needs to see Pulmonary for evaluation and CPAP     Annual Wellness Visit done today including the all of the following: Reviewed patient's Family Medical History Reviewed patient's SDOH and reviewed tobacco, alcohol, and drug use.  Reviewed and updated list of patient's medical providers Assessment of cognitive impairment was done Assessed patient's functional ability Established a written schedule for health screening services Health Risk Assessent Completed and Reviewed  Discussed health benefits of physical activity, and encouraged him to engage in regular exercise appropriate for his age a   I,Makayla C Reid,acting as a scribe for Ronal JINNY Hailstone, MD.,have documented all relevant documentation on the behalf of Ronal JINNY Hailstone, MD,as directed by  Ronal JINNY Hailstone, MD while in the presence of Ronal JINNY Hailstone, MD.  I, Ronal JINNY Hailstone, MD, have reviewed all documentation for and agree with  the above Annual Wellness Visit documentation.  Ronal JINNY Hailstone, MD Internal Medicine 09/18/2024

## 2024-09-19 ENCOUNTER — Encounter: Payer: Self-pay | Admitting: Primary Care

## 2024-09-19 ENCOUNTER — Ambulatory Visit (INDEPENDENT_AMBULATORY_CARE_PROVIDER_SITE_OTHER): Payer: Medicare Other | Admitting: Primary Care

## 2024-09-19 ENCOUNTER — Ambulatory Visit (INDEPENDENT_AMBULATORY_CARE_PROVIDER_SITE_OTHER): Payer: Medicare Other

## 2024-09-19 VITALS — BP 124/62 | HR 77 | Temp 97.7°F | Ht 69.0 in | Wt 281.6 lb

## 2024-09-19 DIAGNOSIS — Z0000 Encounter for general adult medical examination without abnormal findings: Secondary | ICD-10-CM | POA: Diagnosis not present

## 2024-09-19 DIAGNOSIS — G4733 Obstructive sleep apnea (adult) (pediatric): Secondary | ICD-10-CM | POA: Diagnosis not present

## 2024-09-19 NOTE — Patient Instructions (Addendum)
 Leonard [name],  Thank you for taking the time for your Medicare Wellness Visit. I appreciate your continued commitment to your health goals. Please review the care plan we discussed, and feel free to reach out if I can assist you further.  Please note that Annual Wellness Visits do not include a physical exam. Some assessments may be limited, especially if the visit was conducted virtually. If needed, we may recommend an in-person follow-up with your provider.  Ongoing Care Seeing your primary care provider every 3 to 6 months helps us  monitor your health and provide consistent, personalized care.   Referrals If a referral was made during today's visit and you haven't received any updates within two weeks, please contact the referred provider directly to check on the status.  Recommended Screenings:  Health Maintenance  Topic Date Due   Eye exam for diabetics  11/24/2019   Medicare Annual Wellness Visit  09/14/2024   COVID-19 Vaccine (8 - 2025-26 season) 10/03/2024*   Hemoglobin A1C  03/14/2025   Complete foot exam   07/05/2025   Yearly kidney function blood test for diabetes  09/14/2025   Yearly kidney health urinalysis for diabetes  09/14/2025   DTaP/Tdap/Td vaccine (3 - Td or Tdap) 04/05/2032   Pneumococcal Vaccine for age over 33  Completed   Flu Shot  Completed   Zoster (Shingles) Vaccine  Completed   Meningitis B Vaccine  Aged Out   Colon Cancer Screening  Discontinued   Hepatitis C Screening  Discontinued  *Topic was postponed. The date shown is not the original due date.       09/19/2024   10:38 AM  Advanced Directives  Does Patient Have a Medical Advance Directive? Yes  Type of Advance Directive Living will  Does patient want to make changes to medical advance directive? No - Patient declined    Vision: Annual vision screenings are recommended for early detection of glaucoma, cataracts, and diabetic retinopathy. These exams can also reveal signs of chronic conditions  such as diabetes and high blood pressure.  Dental: Annual dental screenings help detect early signs of oral cancer, gum disease, and other conditions linked to overall health, including heart disease and diabetes.  Please see the attached documents for additional preventive care recommendations.     Next appointment: Follow up in one year for your annual wellness visit.   Preventive Care 4 Years and Older, Male Preventive care refers to lifestyle choices and visits with your health care provider that can promote health and wellness. What does preventive care include? A yearly physical exam. This is also called an annual well check. Dental exams once or twice a year. Routine eye exams. Ask your health care provider how often you should have your eyes checked. Personal lifestyle choices, including: Daily care of your teeth and gums. Regular physical activity. Eating a healthy diet. Avoiding tobacco and drug use. Limiting alcohol use. Practicing safe sex. Taking low doses of aspirin every day. Taking vitamin and mineral supplements as recommended by your health care provider. What happens during an annual well check? The services and screenings done by your health care provider during your annual well check will depend on your age, overall health, lifestyle risk factors, and family history of disease. Counseling  Your health care provider may ask you questions about your: Alcohol use. Tobacco use. Drug use. Emotional well-being. Home and relationship well-being. Sexual activity. Eating habits. History of falls. Memory and ability to understand (cognition). Work and work astronomer. Screening  You  may have the following tests or measurements: Height, weight, and BMI. Blood pressure. Lipid and cholesterol levels. These may be checked every 5 years, or more frequently if you are over 52 years old. Skin check. Lung cancer screening. You may have this screening every year  starting at age 61 if you have a 30-pack-year history of smoking and currently smoke or have quit within the past 15 years. Fecal occult blood test (FOBT) of the stool. You may have this test every year starting at age 69. Flexible sigmoidoscopy or colonoscopy. You may have a sigmoidoscopy every 5 years or a colonoscopy every 10 years starting at age 42. Prostate cancer screening. Recommendations will vary depending on your family history and other risks. Hepatitis C blood test. Hepatitis B blood test. Sexually transmitted disease (STD) testing. Diabetes screening. This is done by checking your blood sugar (glucose) after you have not eaten for a while (fasting). You may have this done every 1-3 years. Abdominal aortic aneurysm (AAA) screening. You may need this if you are a current or former smoker. Osteoporosis. You may be screened starting at age 46 if you are at high risk. Talk with your health care provider about your test results, treatment options, and if necessary, the need for more tests. Vaccines  Your health care provider may recommend certain vaccines, such as: Influenza vaccine. This is recommended every year. Tetanus, diphtheria, and acellular pertussis (Tdap, Td) vaccine. You may need a Td booster every 10 years. Zoster vaccine. You may need this after age 24. Pneumococcal 13-valent conjugate (PCV13) vaccine. One dose is recommended after age 80. Pneumococcal polysaccharide (PPSV23) vaccine. One dose is recommended after age 45. Talk to your health care provider about which screenings and vaccines you need and how often you need them. This information is not intended to replace advice given to you by your health care provider. Make sure you discuss any questions you have with your health care provider. Document Released: 11/28/2015 Document Revised: 07/21/2016 Document Reviewed: 09/02/2015 Elsevier Interactive Patient Education  2017 [employer].  Fall Prevention in the  Home Falls can cause injuries. They can happen to people of all ages. There are many things you can do to make your home safe and to help prevent falls. What can I do on the outside of my home? Regularly fix the edges of walkways and driveways and fix any cracks. Remove anything that might make you trip as you walk through a door, such as a raised step or threshold. Trim any bushes or trees on the path to your home. Use bright outdoor lighting. Clear any walking paths of anything that might make someone trip, such as rocks or tools. Regularly check to see if handrails are loose or broken. Make sure that both sides of any steps have handrails. Any raised decks and porches should have guardrails on the edges. Have any leaves, snow, or ice cleared regularly. Use sand or salt on walking paths during winter. Clean up any spills in your garage right away. This includes oil or grease spills. What can I do in the bathroom? Use night lights. Install grab bars by the toilet and in the tub and shower. Do not use towel bars as grab bars. Use non-skid mats or decals in the tub or shower. If you need to sit down in the shower, use a plastic, non-slip stool. Keep the floor dry. Clean up any water  that spills on the floor as soon as it happens. Remove soap buildup in the  tub or shower regularly. Attach bath mats securely with double-sided non-slip rug tape. Do not have throw rugs and other things on the floor that can make you trip. What can I do in the bedroom? Use night lights. Make sure that you have a light by your bed that is easy to reach. Do not use any sheets or blankets that are too big for your bed. They should not hang down onto the floor. Have a firm chair that has side arms. You can use this for support while you get dressed. Do not have throw rugs and other things on the floor that can make you trip. What can I do in the kitchen? Clean up any spills right away. Avoid walking on wet  floors. Keep items that you use a lot in easy-to-reach places. If you need to reach something above you, use a strong step stool that has a grab bar. Keep electrical cords out of the way. Do not use floor polish or wax that makes floors slippery. If you must use wax, use non-skid floor wax. Do not have throw rugs and other things on the floor that can make you trip. What can I do with my stairs? Do not leave any items on the stairs. Make sure that there are handrails on both sides of the stairs and use them. Fix handrails that are broken or loose. Make sure that handrails are as long as the stairways. Check any carpeting to make sure that it is firmly attached to the stairs. Fix any carpet that is loose or worn. Avoid having throw rugs at the top or bottom of the stairs. If you do have throw rugs, attach them to the floor with carpet tape. Make sure that you have a light switch at the top of the stairs and the bottom of the stairs. If you do not have them, ask someone to add them for you. What else can I do to help prevent falls? Wear shoes that: Do not have high heels. Have rubber bottoms. Are comfortable and fit you well. Are closed at the toe. Do not wear sandals. If you use a stepladder: Make sure that it is fully opened. Do not climb a closed stepladder. Make sure that both sides of the stepladder are locked into place. Ask someone to hold it for you, if possible. Clearly mark and make sure that you can see: Any grab bars or handrails. First and last steps. Where the edge of each step is. Use tools that help you move around (mobility aids) if they are needed. These include: Canes. Walkers. Scooters. Crutches. Turn on the lights when you go into a dark area. Replace any light bulbs as soon as they burn out. Set up your furniture so you have a clear path. Avoid moving your furniture around. If any of your floors are uneven, fix them. If there are any pets around you, be aware of  where they are. Review your medicines with your doctor. Some medicines can make you feel dizzy. This can increase your chance of falling. Ask your doctor what other things that you can do to help prevent falls. This information is not intended to replace advice given to you by your health care provider. Make sure you discuss any questions you have with your health care provider. Document Released: 08/28/2009 Document Revised: 04/08/2016 Document Reviewed: 12/06/2014 Elsevier Interactive Patient Education  2017 [employer].

## 2024-09-19 NOTE — Progress Notes (Addendum)
 Subjective:   Leonard [name]. is a 78 y.o. male who presents for a Medicare Annual Wellness Visit. Location; patient is at home and I am at my office.  Patient agreed to visit in this format today.   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 09/05/2006   EGD   Barrett's esophagus 02/2006   CKD (chronic kidney disease)    Coronary artery disease    Diverticulosis of colon (without mention of hemorrhage) 09/05/2006   Colonoscopy    Gastric polyp 2014   done at [facility]   GERD (gastroesophageal reflux disease) 07/26/2001   EGD(Chronic)   Gouty arthropathy    Heart murmur    Hemorrhoids    History of hiatal hernia    History of kidney stones    Hx of colonic polyps 09/05/2006   Colonoscopy(ADENOMATOUS POLYP)   Iron deficiency anemia, unspecified    Monoclonal gammopathy    OSA (obstructive sleep apnea)    USES CPAP MACHINE    Other and unspecified hyperlipidemia    Prostate cancer (HCC) 2015   Sleep apnea    Type II or unspecified type diabetes mellitus without mention of complication, not stated as uncontrolled    Unspecified essential hypertension    Past Surgical History:  Procedure Laterality Date   COLONOSCOPY     ESOPHAGOGASTRODUODENOSCOPY     Done at [facility]   ESOPHAGOGASTRODUODENOSCOPY (EGD) WITH PROPOFOL  N/A 11/04/2017   Procedure: ESOPHAGOGASTRODUODENOSCOPY (EGD) WITH PROPOFOL ;  Surgeon: Albertus Gordy HERO, MD;  Location: [facility] ENDOSCOPY;  Service: Gastroenterology;  Laterality: N/A;   GI PROSTATE BIOPSY     HEMORRHOID SURGERY     HERNIA REPAIR     INCISIONAL HERNIA REPAIR N/A 08/07/2015   Procedure: LAPAROSCOPIC INCISIONAL HERNIA repair with mesh;  Surgeon: Herlene Righter Kinsinger, MD;  Location: [facility] ORS;  Service: General;  Laterality: N/A;   KNEE SURGERY  1999   TUMOR REMOVED LEFT KNEE & ARTHROSCOPIC SURGERY   LYMPHADENECTOMY  Bilateral 05/01/2014   Procedure: LYMPHADENECTOMY;  Surgeon: Ricardo Likens, MD;  Location: [facility] OR[facility];  Service: Urology;  Laterality: Bilateral;   MASS EXCISION Right 08/28/2020   Procedure: EXCISION RIGHT SHOULDER MASS;  Surgeon: Aron Shoulders, MD;  Location: [facility] OR;  Service: General;  Laterality: Right;   PARTIAL KNEE ARTHROPLASTY Left 06/28/2023   Procedure: LEFT UNICOMPARTMENTAL KNEE;  Surgeon: Josefina Chew, MD;  Location: [facility] OR[facility];  Service: Orthopedics;  Laterality: Left;   ROBOT ASSISTED LAPAROSCOPIC RADICAL PROSTATECTOMY N/A 05/01/2014   Procedure: ROBOTIC ASSISTED LAPAROSCOPIC RADICAL PROSTATECTOMY WITH INDOCYANINE GREEN  DYE;  Surgeon: Ricardo Likens, MD;  Location: [facility];  Service: Urology;  Laterality: N/A;   SMALL INTESTINE SURGERY     UPPER GASTROINTESTINAL ENDOSCOPY     Family History  Problem Relation Age of Onset   Heart attack Father    Diabetes Mother    Diabetes Brother    Colon cancer Neg Hx    Stomach cancer Neg Hx    Esophageal cancer Neg Hx    Rectal cancer Neg Hx    Liver cancer Neg Hx    Social History   Occupational History   Occupation: retired  Tobacco Use   Smoking status: Never   Smokeless tobacco: Never  Vaping Use   Vaping status: Never Used  Substance and Sexual Activity   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  Drug use: No   Sexual activity: Yes    Partners: Female   Tobacco Counseling Counseling given: Not Answered  SDOH Screenings   Food Insecurity: No Food Insecurity (09/19/2024)  Housing: Unknown (09/19/2024)  Transportation Needs: No Transportation Needs (09/19/2024)  Utilities: Not At Risk (09/19/2024)  Alcohol Screen: Low Risk  (09/19/2024)  Depression (PHQ2-9): Low Risk  (09/19/2024)  Financial Resource Strain: Low Risk  (09/19/2024)  Physical Activity: Insufficiently Active (09/19/2024)  Social Connections: Socially Integrated (09/19/2024)  Stress: No Stress Concern Present (09/19/2024)  Tobacco Use: Low Risk  (09/19/2024)   Health Literacy: Adequate Health Literacy (09/19/2024)   Depression Screen    09/19/2024   10:48 AM 03/13/2024    9:38 AM 09/15/2023   11:05 AM 03/01/2023   10:44 AM 08/26/2022   11:31 AM 08/23/2022    2:38 PM 04/26/2022   11:43 AM  PHQ 2/9 Scores  PHQ - 2 Score 0 0 0 0 0 0 0  PHQ- 9 Score       0     Goals Addressed   None    Visit info / Clinical Intake: Medicare Wellness Visit Type:: Subsequent Annual Wellness Visit Medicare Wellness Visit Mode:: Telephone If telephone:: video declined If telephone or video:: unable to obtan vitals due to lack of equipment Interpreter Needed?: No Pre-visit prep was completed: yes AWV questionnaire completed by patient prior to visit?: no Date:: 09/19/24 Living arrangements:: lives with spouse/significant other Patient's Overall Health Status Rating: very good Typical amount of pain: none Does pain affect daily life?: no Are you currently prescribed opioids?: no  Dietary Habits and Nutritional Risks How many meals a day?: 2 Eats fruit and vegetables daily?: yes Most meals are obtained by: preparing own meals Diabetic:: no  Functional Status Activities of Daily Living (to include ambulation/medication): Independent Ambulation: Independent Medication Administration: Independent Home Management: Independent Manage your own finances?: yes Primary transportation is: driving Concerns about vision?: no *vision screening is required for WT[facility]* Concerns about hearing?: no  Fall Screening Falls in the past year?: 0 Number of falls in past year: 0 Was there an injury with Fall?: 0 Fall Risk Category Calculator: 0 Patient Fall Risk Level: Low Fall Risk  Fall Risk Patient at Risk for Falls Due to: No Fall Risks Fall risk Follow up: Falls prevention discussed; Education provided; Falls evaluation completed  Home and Transportation Safety: All rugs have non-skid backing?: yes All stairs or steps have railings?: yes Grab bars in the  bathtub or shower?: yes Have non-skid surface in bathtub or shower?: yes Good home lighting?: yes Regular seat belt use?: yes Hospital stays in the last year:: no  Cognitive Assessment Difficulty concentrating, remembering, or making decisions? : no Will 6CIT or Mini Cog be Completed: yes What year is it?: 0 points What month is it?: 0 points Give patient an address phrase to remember (5 components): [address] [place] what time is it?: 0 points Count backwards from 20 to 1: 0 points Say the months of the year in reverse: 0 points Repeat the address phrase from earlier: 0 points 6 CIT Score: 0 points  Advance Directives (For Healthcare) Does Patient Have a Medical Advance Directive?: Yes Does patient want to make changes to medical advance directive?: No - Patient declined Type of Advance Directive: Living will Copy of Living Will in Chart?: No - copy requested        Objective:    There were no vitals filed for this visit. There is  no height or weight on file to calculate BMI.  Current Medications (verified) Outpatient Encounter Medications as of 09/19/2024  Medication Sig   allopurinol  (ZYLOPRIM ) 300 MG tablet TAKE 1 TABLET BY MOUTH DAILY TO PREVENT GOUT   amLODipine -benazepril  (LOTREL) 10-20 MG capsule TAKE 1 CAPSULE BY MOUTH EVERY DAY   aspirin 81 MG tablet Take 81 mg by mouth daily.   atorvastatin  (LIPITOR) 10 MG tablet TAKE 1 TABLET BY MOUTH EVERY DAY   baclofen  (LIORESAL ) 10 MG tablet Take 1 tablet (10 mg total) by mouth 3 (three) times daily. As needed for muscle spasm   EPINEPHrine  (EPIPEN  2-PAK) 0.3 mg/0.3 mL IJ SOAJ injection Inject 0.3 mg into the muscle as needed for anaphylaxis.   esomeprazole  (NEXIUM ) 20 MG capsule Take 20 mg by mouth daily at 12 noon.   FeFum-FePoly-FA-B Cmp-C-Biot (IRON FOLATE PLUS) CAPS TAKE 1 CAPSULE BY MOUTH EVERY DAY IN THE MORNING   metFORMIN  (GLUCOPHAGE ) 500 MG tablet TAKE 1 TABLET (500 MG TOTAL) BY MOUTH 1 DAY OR 1 DOSE.    metoprolol  succinate (TOPROL -XL) 25 MG 24 hr tablet TAKE 1/2 TABLET BY MOUTH DAILY   nitroGLYCERIN  (NITROSTAT ) 0.4 MG SL tablet Place 1 tablet (0.4 mg total) under the tongue every 5 (five) minutes as needed for chest pain. Reported on 03/04/2016   ondansetron  (ZOFRAN ) 4 MG tablet Take 1 tablet (4 mg total) by mouth every 8 (eight) hours as needed for nausea or vomiting.   OZEMPIC, 0.25 OR 0.5 MG/DOSE, 2 MG/3ML SOPN Inject 0.5 mg into the skin once a week.   sennosides-docusate sodium  (SENOKOT-S) 8.6-50 MG tablet Take 2 tablets by mouth daily.   sitaGLIPtin  (JANUVIA ) 100 MG tablet TAKE 1 TABLET BY MOUTH EVERY DAY   triamterene -hydrochlorothiazide (MAXZIDE-25) 37.5-25 MG tablet TAKE 1 TABLET BY MOUTH EVERY DAY   [DISCONTINUED] amLODipine -benazepril  (LOTREL) 10-20 MG capsule TAKE 1 CAPSULE BY MOUTH EVERY DAY   No facility-administered encounter medications on file as of 09/19/2024.   Hearing/Vision screen No results found. Immunizations and Health Maintenance Health Maintenance  Topic Date Due   OPHTHALMOLOGY EXAM  11/24/2019   Medicare Annual Wellness (AWV)  09/14/2024   COVID-19 Vaccine (8 - 2025-26 season) 10/03/2024 (Originally 07/16/2024)   HEMOGLOBIN A1C  03/14/2025   FOOT EXAM  07/05/2025   Diabetic kidney evaluation - eGFR measurement  09/14/2025   Diabetic kidney evaluation - Urine ACR  09/14/2025   DTaP/Tdap/Td (3 - Td or Tdap) 04/05/2032   Pneumococcal Vaccine: 50+ Years  Completed   Influenza Vaccine  Completed   Zoster Vaccines- Shingrix  Completed   Meningococcal B Vaccine  Aged Out   Colonoscopy  Discontinued   Hepatitis C Screening  Discontinued        Assessment/Plan:  This is a routine wellness examination for Orlin.  Patient Care Team: [name], MD as PCP - General (Internal Medicine) Okey Vina GAILS, MD as PCP - Cardiology (Cardiology) Aleene Kitchens, MD (Inactive) (Urology)  I have personally reviewed and noted the following in the patient's chart:   Medical  and social history Use of alcohol, tobacco or illicit drugs  Current medications and supplements including opioid prescriptions. Functional ability and status Nutritional status Physical activity Advanced directives List of other physicians Hospitalizations, surgeries, and ER visits in previous 12 months Vitals Screenings to include cognitive, depression, and falls Referrals and appointments  No orders of the defined types were placed in this encounter.  In addition, I have reviewed and discussed with patient certain preventive protocols, quality metrics,  and best practice recommendations. A written personalized care plan for preventive services as well as general preventive health recommendations were provided to patient.   Dominie Benedick, CMA   09/19/2024   No follow-ups on file.

## 2024-09-19 NOTE — Progress Notes (Signed)
 @Patient  ID: Leonard [name]., male    DOB: February 15, 1946, 78 y.o.   MRN: [id number]  No chief complaint on file.   Referring provider: [name], MD  HPI: 78 year old male, never smoked.  Past medical history significant for hypertension, coronary artery disease, obstructive sleep apnea, type 2 diabetes, lipidemia, iron deficiency anemia, monoclonal gammopathy, history of prostate cancer, obesity.  PSG December 2008>> AHI 29/hr.  Previous [facility] pulmonary encounter:  09/20/2023 Patient presents today for 28-month follow-up. Sleep study in December 2008 showed moderate to severe obstructive sleep apnea. Maintained on CPAP 11cm h20. Ordered for replacement CPAP machine during last OV. DME is [facility]   Discussed the use of AI scribe software for clinical note transcription with the patient, who gave verbal consent to proceed.  History of Present Illness   History of moderate to severe obstructive sleep apnea diagnosed in 2008, he has been on continuous positive airway pressure (CPAP) therapy for several years. He reports consistent use of the CPAP machine, with occasional nights of non-use. The patient received a new CPAP machine within the last year, which has been functioning well without any issues. He reports good sleep quality overall, with occasional instances of falling asleep before putting on the CPAP mask. The patient notes a significant difference in his well-being when the CPAP machine is not used for two consecutive days, which he tries to avoid. The patient still experiences occasional apneic events on download. He was taking Ozempic for weight loss, which he plans to restart.   He has been maintaining his CPAP machine well, with regular cleaning and replacement of parts as recommended.   Airview download 08/20/2023 - 09/18/2023 Usage days 22/30 days (73%); 14 days (47%) greater than 4 hours Average usage days used 3 hours 42 minutes Pressure 11 cm H2O Air leaks 1.5 L/min (95%) AHI  6.2   09/19/2024- Interim hx  Discussed the use of AI scribe software for clinical note transcription with the patient, who gave verbal consent to proceed.  History of Present Illness Sleep study in December 2008 showed moderate to severe obstructive sleep apnea. Cpap replaced 2 years ago. Machine is working fine. No issues with pressure settings.  Sleep quality and fatigue are better when consistently using CPAP. Not waking up gasping or choking. Complaint with CPAP 71% >4 hours over the last 90 days.  He occasionally falls asleep while watching TV before putting on CPAP   Current pressure 13cm h20 with residual AHI 7.2 Changing supplies regularly       Allergies  Allergen Reactions   Dicloxacillin Rash and Other (See Comments)    Has patient had a PCN reaction causing immediate rash, facial/tongue/throat swelling, SOB or lightheadedness with hypotension: No Has patient had a PCN reaction causing severe rash involving mucus membranes or skin necrosis: No Has patient had a PCN reaction that required hospitalization: No Has patient had a PCN reaction occurring within the last 10 years: No If all of the above answers are NO, then may proceed with Cephalosporin use.    Cayenne Pepper [Cayenne] Swelling   Nsaids Other (See Comments)    PT declines Acute renal failure 08/09/2015    Immunization History  Administered Date(s) Administered   Fluad Quad(high Dose 65+) 08/09/2022   INFLUENZA, HIGH DOSE SEASONAL PF 08/09/2024   Influenza Split 08/15/2013   Influenza Whole 07/17/2011, 09/15/2012   Influenza, Seasonal, Injecte, Preservative Fre 09/15/2023   Influenza,inj,Quad PF,6+ Mos 07/17/2015, 07/13/2016, 08/12/2017, 08/08/2018, 07/13/2019, 07/15/2020, 08/10/2021  Influenza-Unspecified 09/05/2014   Moderna SARS-COV2 Booster Vaccination 08/25/2022   PFIZER Comirnaty(Gray Top)Covid-19 Tri-Sucrose Vaccine 03/09/2021   PFIZER(Purple Top)SARS-COV-2 Vaccination 12/22/2019, 01/12/2020,  07/15/2020   PNEUMOCOCCAL CONJUGATE-20 03/25/2022   Pfizer Covid-19 Vaccine Bivalent Booster 56yrs & up 09/01/2021, 03/25/2022   Pneumococcal Conjugate-13 12/20/2014   Pneumococcal Polysaccharide-23 05/12/2006, 12/14/2013   Respiratory Syncytial Virus Vaccine,Recomb Aduvanted(Arexvy) 11/04/2022   Tdap 05/01/2010, 04/05/2022   Unspecified SARS-COV-2 Vaccination 08/25/2022   Zoster Recombinant(Shingrix) 09/04/2018, 01/03/2019    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 09/05/2006   EGD   Barrett's esophagus 02/2006   CKD (chronic kidney disease)    Coronary artery disease    Diverticulosis of colon (without mention of hemorrhage) 09/05/2006   Colonoscopy    Gastric polyp 2014   done at [facility]   GERD (gastroesophageal reflux disease) 07/26/2001   EGD(Chronic)   Gouty arthropathy    Hemorrhoids    History of hiatal hernia    History of kidney stones    Hx of colonic polyps 09/05/2006   Colonoscopy(ADENOMATOUS POLYP)   Iron deficiency anemia, unspecified    Monoclonal gammopathy    OSA (obstructive sleep apnea)    USES CPAP MACHINE    Other and unspecified hyperlipidemia    Prostate cancer (HCC) 2015   Type II or unspecified type diabetes mellitus without mention of complication, not stated as uncontrolled    Unspecified essential hypertension     Tobacco History: Social History   Tobacco Use  Smoking Status Never  Smokeless Tobacco Never   Counseling given: Not Answered   Outpatient Medications Prior to Visit  Medication Sig Dispense Refill   allopurinol  (ZYLOPRIM ) 300 MG tablet TAKE 1 TABLET BY MOUTH DAILY TO PREVENT GOUT 90 tablet 3   amLODipine -benazepril  (LOTREL) 10-20 MG capsule TAKE 1 CAPSULE BY MOUTH EVERY DAY 90 capsule 3   aspirin 81 MG tablet Take 81 mg by mouth daily.     atorvastatin  (LIPITOR) 10 MG tablet TAKE 1 TABLET BY MOUTH EVERY DAY 90 tablet 3   baclofen  (LIORESAL ) 10 MG tablet Take 1 tablet (10 mg  total) by mouth 3 (three) times daily. As needed for muscle spasm 50 tablet 0   EPINEPHrine  (EPIPEN  2-PAK) 0.3 mg/0.3 mL IJ SOAJ injection Inject 0.3 mg into the muscle as needed for anaphylaxis. 1 each 2   esomeprazole  (NEXIUM ) 20 MG capsule Take 20 mg by mouth daily at 12 noon.     FeFum-FePoly-FA-B Cmp-C-Biot (IRON FOLATE PLUS) CAPS TAKE 1 CAPSULE BY MOUTH EVERY DAY IN THE MORNING 90 capsule 1   metFORMIN  (GLUCOPHAGE ) 500 MG tablet TAKE 1 TABLET (500 MG TOTAL) BY MOUTH 1 DAY OR 1 DOSE. 90 tablet 3   metoprolol  succinate (TOPROL -XL) 25 MG 24 hr tablet TAKE 1/2 TABLET BY MOUTH DAILY 45 tablet 3   nitroGLYCERIN  (NITROSTAT ) 0.4 MG SL tablet Place 1 tablet (0.4 mg total) under the tongue every 5 (five) minutes as needed for chest pain. Reported on 03/04/2016 30 tablet 5   ondansetron  (ZOFRAN ) 4 MG tablet Take 1 tablet (4 mg total) by mouth every 8 (eight) hours as needed for nausea or vomiting. 10 tablet 0   OZEMPIC, 0.25 OR 0.5 MG/DOSE, 2 MG/3ML SOPN Inject 0.5 mg into the skin once a week.     sennosides-docusate sodium  (SENOKOT-S) 8.6-50 MG tablet Take 2 tablets by mouth daily. 30 tablet 1   sitaGLIPtin  (JANUVIA ) 100 MG tablet TAKE 1 TABLET BY MOUTH EVERY DAY 90 tablet 3  triamterene -hydrochlorothiazide (MAXZIDE-25) 37.5-25 MG tablet TAKE 1 TABLET BY MOUTH EVERY DAY 90 tablet 3   No facility-administered medications prior to visit.    Review of Systems  Review of Systems  Constitutional: Negative.   Respiratory: Negative.    Cardiovascular: Negative.     Physical Exam  There were no vitals taken for this visit. Physical Exam Constitutional:      Appearance: Normal appearance. He is not ill-appearing.  HENT:     Head: Normocephalic and atraumatic.  Cardiovascular:     Rate and Rhythm: Normal rate.     Comments: Regularly irregular  Pulmonary:     Effort: Pulmonary effort is normal.     Breath sounds: Normal breath sounds.  Musculoskeletal:     Cervical back: Normal range of  motion and neck supple.  Skin:    General: Skin is warm and dry.  Neurological:     General: No focal deficit present.     Mental Status: He is alert and oriented to person, place, and time. Mental status is at baseline.  Psychiatric:        Mood and Affect: Mood normal.        Behavior: Behavior normal.        Thought Content: Thought content normal.        Judgment: Judgment normal.     Lab Results:  CBC    Component Value Date/Time   WBC 11.8 (H) 09/14/2024 0910   RBC 3.84 (L) 09/14/2024 0910   HGB 12.1 (L) 09/14/2024 0910   HGB 11.8 (L) 08/21/2024 1015   HGB 11.9 (L) 09/14/2017 0928   HCT 37.1 (L) 09/14/2024 0910   HCT 36.3 (L) 09/14/2017 0928   PLT 307 09/14/2024 0910   PLT 261 08/21/2024 1015   PLT 284 09/14/2017 0928   MCV 96.6 09/14/2024 0910   MCV 95.8 09/14/2017 0928   MCH 31.5 09/14/2024 0910   MCHC 32.6 09/14/2024 0910   RDW 14.8 09/14/2024 0910   RDW 15.0 (H) 09/14/2017 0928   LYMPHSABS 2.5 08/21/2024 1015   LYMPHSABS 4.1 (H) 09/14/2017 0928   MONOABS 0.7 08/21/2024 1015   MONOABS 0.7 09/14/2017 0928   EOSABS 130 09/14/2024 0910   EOSABS 0.3 09/14/2017 0928   BASOSABS 35 09/14/2024 0910   BASOSABS 0.0 09/14/2017 0928    BMET    Component Value Date/Time   NA 140 09/14/2024 0910   NA 137 06/24/2021 1227   NA 140 09/14/2017 0928   K 4.0 09/14/2024 0910   K 4.0 09/14/2017 0928   CL 105 09/14/2024 0910   CL 104 04/17/2013 0857   CO2 26 09/14/2024 0910   CO2 27 09/14/2017 0928   GLUCOSE 110 (H) 09/14/2024 0910   GLUCOSE 113 09/14/2017 0928   GLUCOSE 105 (H) 04/17/2013 0857   BUN 28 (H) 09/14/2024 0910   BUN 30 (H) 06/24/2021 1227   BUN 28.3 (H) 09/14/2017 0928   CREATININE 1.63 (H) 09/14/2024 0910   CREATININE 1.4 (H) 09/14/2017 0928   CALCIUM  9.8 09/14/2024 0910   CALCIUM  10.1 09/14/2017 0928   GFRNONAA 52 (L) 06/15/2023 1339   GFRNONAA >60 01/25/2023 1001   GFRNONAA 50 (L) 07/08/2020 0907   GFRAA 58 (L) 07/16/2020 1043   GFRAA 58 (L)  07/08/2020 0907    BNP    Component Value Date/Time   BNP 8 06/25/2024 1433    ProBNP No results found for: PROBNP  Imaging: No results found.   Assessment & Plan:   1. Obstructive  sleep apnea (Primary)  Assessment & Plan  Moderate-severe obstructive sleep apnea  Patient is 71% compliant with CPAP use >4 hours over the last 90 days. Reports benefit from therapy. Current pressure 13cm h20 with mild residual apneas AHI 7/hour. Change CPAP pressure auto settings 10-15cm h20. Encourage weight loss. Renew CPAP supplies with [employer]. FU in 1 year or sooner if needed.    Almarie LELON Ferrari, NP 09/19/2024

## 2024-09-19 NOTE — Patient Instructions (Signed)
 Continue to wear CPAP nightly 4-6 hours  Change pressure from 13 to auto setting 10-15cm h20 Renew CPAP supplies with [employer] Continue weight loss efforts   Follow-up 1 year with [facility] or sooner if needed   CPAP and BIPAP Information CPAP and BIPAP use air pressure to keep your airways open and help you breathe well. CPAP and BIPAP use different amounts of pressure. Your health care provider will tell you whether CPAP or BIPAP would be best for you. CPAP stands for continuous positive airway pressure. With CPAP, the amount of pressure stays the same while you breathe in and out. BIPAP stands for bi-level positive airway pressure. With BIPAP, the amount of pressure will be higher when you breathe in and lower when you breathe out. This allows you to take bigger breaths. CPAP or BIPAP may be used in the hospital or at home. You may need to have a sleep study before your provider can order a device for you to use at home. What are the advantages? CPAP and BIPAP are most often used for obstructive sleep apnea to keep the airways from collapsing when the muscles relax during sleep. CPAP or BIPAP can be used if you have: Chronic obstructive pulmonary disease. Heart failure. Medical conditions that cause muscle weakness. Other problems that cause breathing to be shallow, weak, or difficult. What are the risks? Your provider will talk with you about risks. These may include: Sores on your nose or face caused from the mask, prongs, or nasal pillows. Dry or stuffy nose or nosebleeds. Feeling gassy or bloated. Sinus or lung infection if the equipment is not cleaned well. When should CPAP or BIPAP be used? In most cases, CPAP or BIPAP is used during sleep at night or whenever the main sleep time happens. It's also used during naps. People with some medical conditions may need to wear the mask when they're awake. Follow instructions from your provider about when to use your CPAP or BIPAP. What  happens during CPAP or BIPAP?  Both CPAP and BIPAP use a small machine that uses electricity to create air pressure. A long tube connects the device to a plastic mask. Air is blown through the mask into your nose or mouth. The amount of pressure that's used to blow the air can be adjusted. Your provider will set the pressure setting and help you find the best mask for you. Tips for using the mask There are different types and sizes of masks. If your mask does not fit well, talk with your provider about getting a different one. Some common types of masks include: Full face masks, which fit over the mouth and nose. Nasal masks, which fit over the nose. Nasal pillow or prong masks, which fit into the nostrils. The mask needs to be snug to your face, so some people feel trapped or closed in at first. If you feel this way, you may need to get used to the mask. Hold the mask loosely over your nose or mouth and then gradually put the the mask on more snugly. Slowly increase the amount of time you use the mask. If you have trouble with your mask not fitting well or leaking, talk with your provider. Do not stop using the mask. Tips for using the device Follow instructions from your provider about how to and how often to use the device. For home use, CPAP and BIPAP devices come from home health care companies. There are many different brands. Your health insurance company  will help to decide which device you get. Keep the CPAP or BIPAP device and attachments clean. Ask your home health care company or check the instruction book for cleaning instructions. Make sure the humidifier is filled with germ-free (sterile) water  and is working correctly. This will help prevent a dry or stuffy nose or nosebleeds. A nasal saline mist or spray may keep your nose from getting dry and sore. Do not eat or drink while the CPAP or BIPAP device is on. Food or drinks could get pushed into your lungs by the pressure of the CPAP  or BIPAP. Follow these instructions at home: Take over-the-counter and prescription medicines only as told by your provider. Do not smoke, vape, or use nicotine or tobacco. Contact a health care provider if: You have redness or pressure sores on your head, face, mouth, or nose from the mask or headgear. You have trouble using the CPAP or BIPAP device. You have trouble going to sleep or staying asleep. Someone tells you that you snore even when wearing your CPAP or BIPAP device. Get help right away if: You have trouble breathing. You feel confused. These symptoms may be an emergency. Get help right away. Call 911. Do not wait to see if the symptoms will go away. Do not drive yourself to the hospital. This information is not intended to replace advice given to you by your health care provider. Make sure you discuss any questions you have with your health care provider. Document Revised: 02/23/2023 Document Reviewed: 02/23/2023 Elsevier Patient Education  2024 [employer].

## 2024-09-25 LAB — HEMOCCULT GUIAC POC 1CARD (OFFICE)
Card #2 Fecal Occult Blod, POC: NEGATIVE
Card #3 Fecal Occult Blood, POC: NEGATIVE
Fecal Occult Blood, POC: NEGATIVE

## 2024-09-26 NOTE — Patient Instructions (Addendum)
 Pt. Has MGUS  ahd has been seen by Oncology. BP is normal on current regimen. Continue current meds. Refer to Pulmonary to evaluate. Suggest Conitnue Lotrel. Metoprolol , and Lipitor. Continue diet and exercise  regimen. Return in 6 months.

## 2024-10-09 ENCOUNTER — Encounter: Payer: Self-pay | Admitting: Internal Medicine

## 2024-10-15 ENCOUNTER — Encounter: Payer: Self-pay | Admitting: Internal Medicine

## 2025-02-12 ENCOUNTER — Other Ambulatory Visit

## 2025-02-19 ENCOUNTER — Other Ambulatory Visit

## 2025-02-19 ENCOUNTER — Ambulatory Visit: Admitting: Internal Medicine
